# Patient Record
Sex: Female | Born: 1980 | Race: White | Hispanic: No | Marital: Married | Smoking: Never smoker
Health system: Southern US, Community
[De-identification: ages and names within clinical notes are randomized; demographics above are authoritative.]

## PROBLEM LIST (undated history)

## (undated) DIAGNOSIS — Z1379 Encounter for other screening for genetic and chromosomal anomalies: Secondary | ICD-10-CM

## (undated) DIAGNOSIS — Z8049 Family history of malignant neoplasm of other genital organs: Secondary | ICD-10-CM

## (undated) DIAGNOSIS — T7840XA Allergy, unspecified, initial encounter: Secondary | ICD-10-CM

## (undated) DIAGNOSIS — E039 Hypothyroidism, unspecified: Secondary | ICD-10-CM

## (undated) DIAGNOSIS — Z808 Family history of malignant neoplasm of other organs or systems: Secondary | ICD-10-CM

## (undated) DIAGNOSIS — G8929 Other chronic pain: Secondary | ICD-10-CM

## (undated) DIAGNOSIS — Z923 Personal history of irradiation: Secondary | ICD-10-CM

## (undated) DIAGNOSIS — K589 Irritable bowel syndrome without diarrhea: Secondary | ICD-10-CM

## (undated) DIAGNOSIS — Z800 Family history of malignant neoplasm of digestive organs: Secondary | ICD-10-CM

## (undated) DIAGNOSIS — K8020 Calculus of gallbladder without cholecystitis without obstruction: Secondary | ICD-10-CM

## (undated) DIAGNOSIS — G43909 Migraine, unspecified, not intractable, without status migrainosus: Secondary | ICD-10-CM

## (undated) DIAGNOSIS — K219 Gastro-esophageal reflux disease without esophagitis: Secondary | ICD-10-CM

## (undated) DIAGNOSIS — R102 Pelvic and perineal pain unspecified side: Secondary | ICD-10-CM

## (undated) DIAGNOSIS — K7689 Other specified diseases of liver: Secondary | ICD-10-CM

## (undated) DIAGNOSIS — R569 Unspecified convulsions: Secondary | ICD-10-CM

## (undated) DIAGNOSIS — R51 Headache: Secondary | ICD-10-CM

## (undated) DIAGNOSIS — Z9189 Other specified personal risk factors, not elsewhere classified: Secondary | ICD-10-CM

## (undated) DIAGNOSIS — R519 Headache, unspecified: Secondary | ICD-10-CM

## (undated) DIAGNOSIS — C73 Malignant neoplasm of thyroid gland: Secondary | ICD-10-CM

## (undated) DIAGNOSIS — F419 Anxiety disorder, unspecified: Secondary | ICD-10-CM

## (undated) DIAGNOSIS — Z803 Family history of malignant neoplasm of breast: Secondary | ICD-10-CM

## (undated) DIAGNOSIS — K602 Anal fissure, unspecified: Secondary | ICD-10-CM

## (undated) HISTORY — DX: Headache, unspecified: R51.9

## (undated) HISTORY — DX: Family history of malignant neoplasm of digestive organs: Z80.0

## (undated) HISTORY — DX: Gastro-esophageal reflux disease without esophagitis: K21.9

## (undated) HISTORY — DX: Pelvic and perineal pain unspecified side: R10.20

## (undated) HISTORY — DX: Migraine, unspecified, not intractable, without status migrainosus: G43.909

## (undated) HISTORY — DX: Irritable bowel syndrome, unspecified: K58.9

## (undated) HISTORY — DX: Family history of malignant neoplasm of other organs or systems: Z80.8

## (undated) HISTORY — DX: Other specified diseases of liver: K76.89

## (undated) HISTORY — DX: Allergy, unspecified, initial encounter: T78.40XA

## (undated) HISTORY — DX: Calculus of gallbladder without cholecystitis without obstruction: K80.20

## (undated) HISTORY — DX: Family history of malignant neoplasm of other genital organs: Z80.49

## (undated) HISTORY — DX: Encounter for other screening for genetic and chromosomal anomalies: Z13.79

## (undated) HISTORY — DX: Malignant neoplasm of thyroid gland: C73

## (undated) HISTORY — DX: Anal fissure, unspecified: K60.2

## (undated) HISTORY — DX: Other specified personal risk factors, not elsewhere classified: Z91.89

## (undated) HISTORY — DX: Family history of malignant neoplasm of breast: Z80.3

## (undated) HISTORY — DX: Headache: R51

## (undated) HISTORY — PX: COLONOSCOPY: SHX174

## (undated) HISTORY — DX: Other chronic pain: G89.29

## (undated) HISTORY — DX: Hypothyroidism, unspecified: E03.9

## (undated) HISTORY — DX: Pelvic and perineal pain: R10.2

---
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
## 2004-11-02 ENCOUNTER — Ambulatory Visit: Payer: Self-pay | Admitting: Unknown Physician Specialty

## 2005-06-20 ENCOUNTER — Emergency Department (HOSPITAL_COMMUNITY): Admission: EM | Admit: 2005-06-20 | Discharge: 2005-06-20 | Payer: Self-pay | Admitting: Emergency Medicine

## 2005-06-20 IMAGING — CR DG ELBOW COMPLETE 3+V*R*
4 series · 4 of 4 positions shown · non-contrast
Comparison: none

CLINICAL DATA: Fall off of a porch.  Medial posterior elbow pain.
 RIGHT ELBOW ? 4 VIEW:
 The patient has calcification associated with both the medial and the lateral soft tissues of the elbow that could relate to the common flexor and extensor tendons.  Some of this could be chronic calcification but the possibility of an avulsion of either of these tendons does exist.  No definite acute fracture.

[view not recorded (1 of 4)]
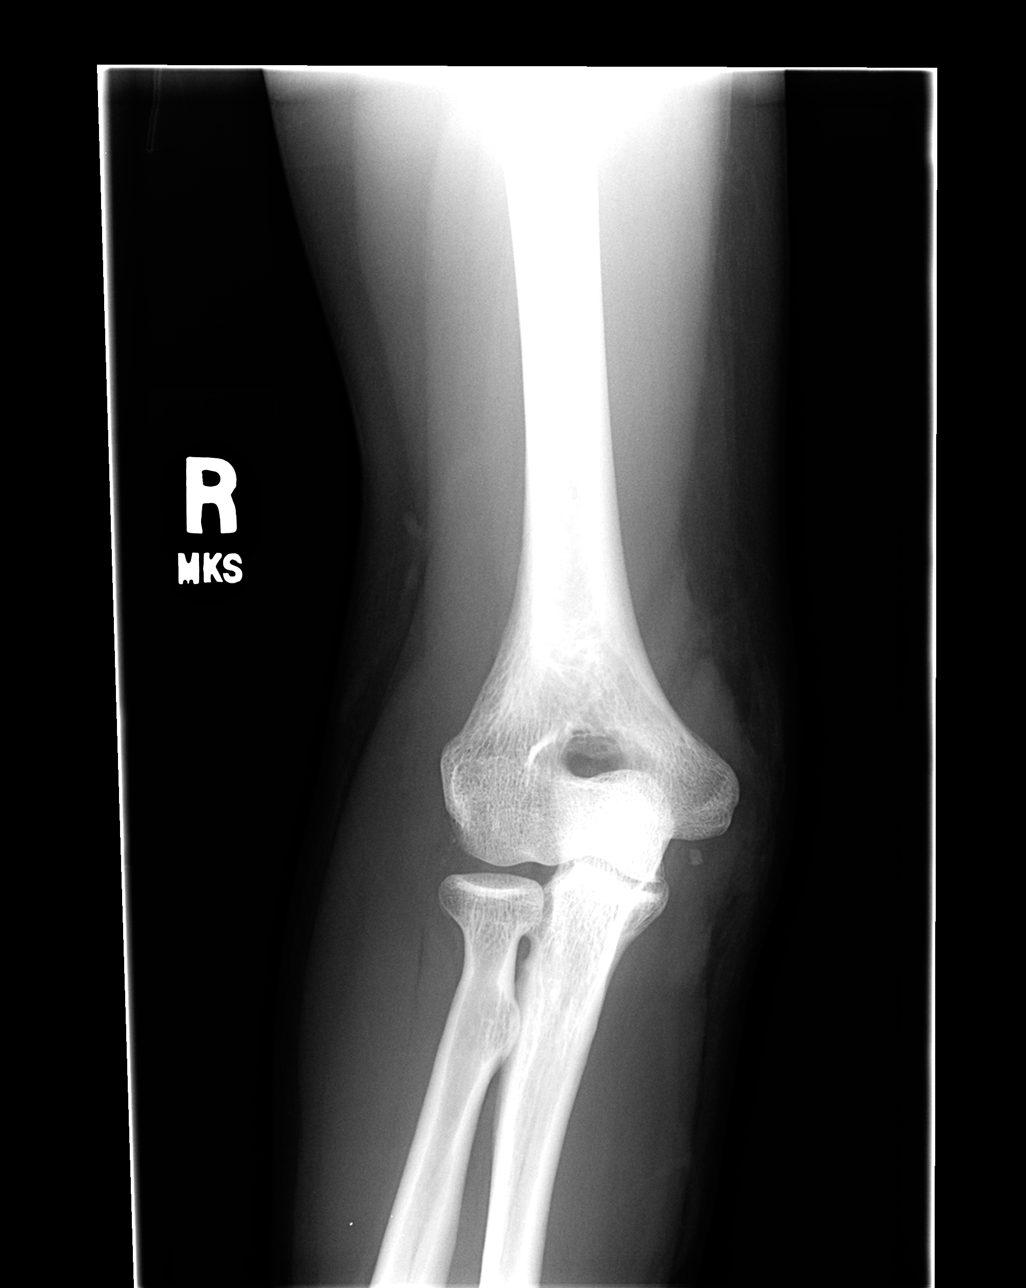

[view not recorded (2 of 4)]
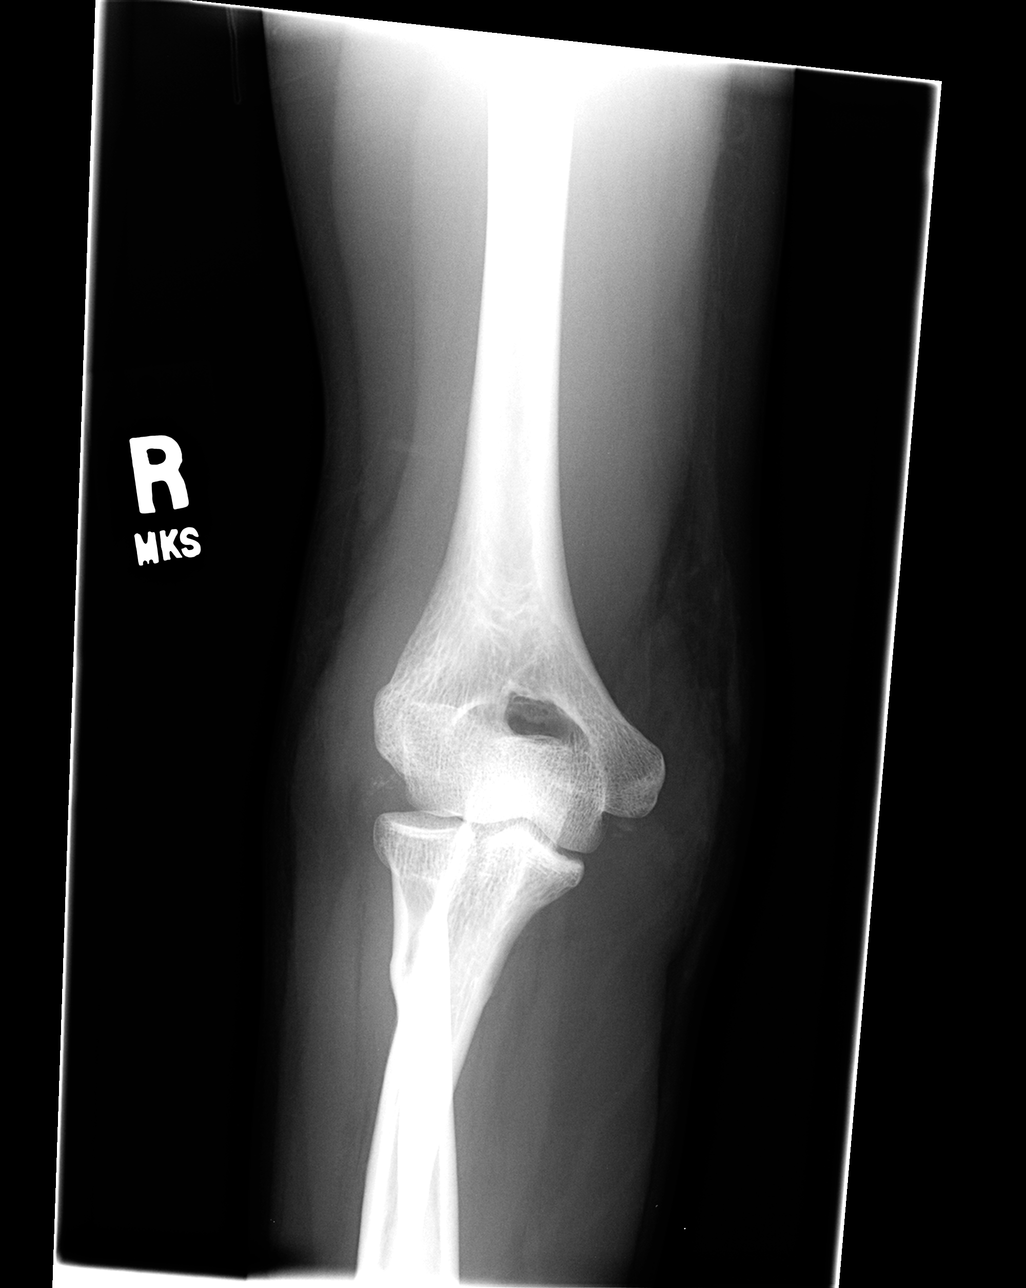

[view not recorded (3 of 4)]
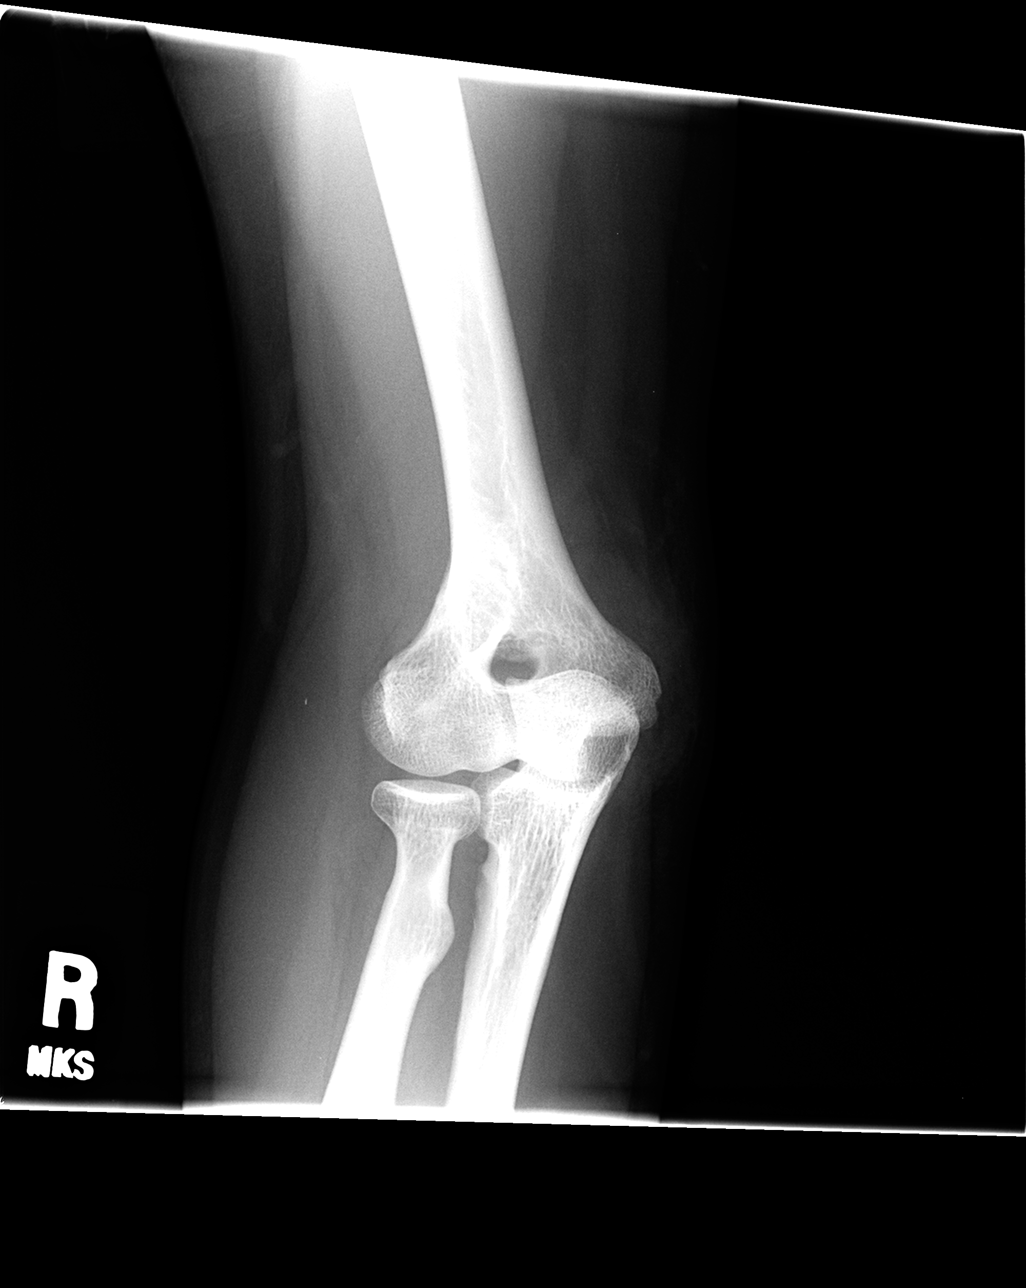

[view not recorded (4 of 4)]
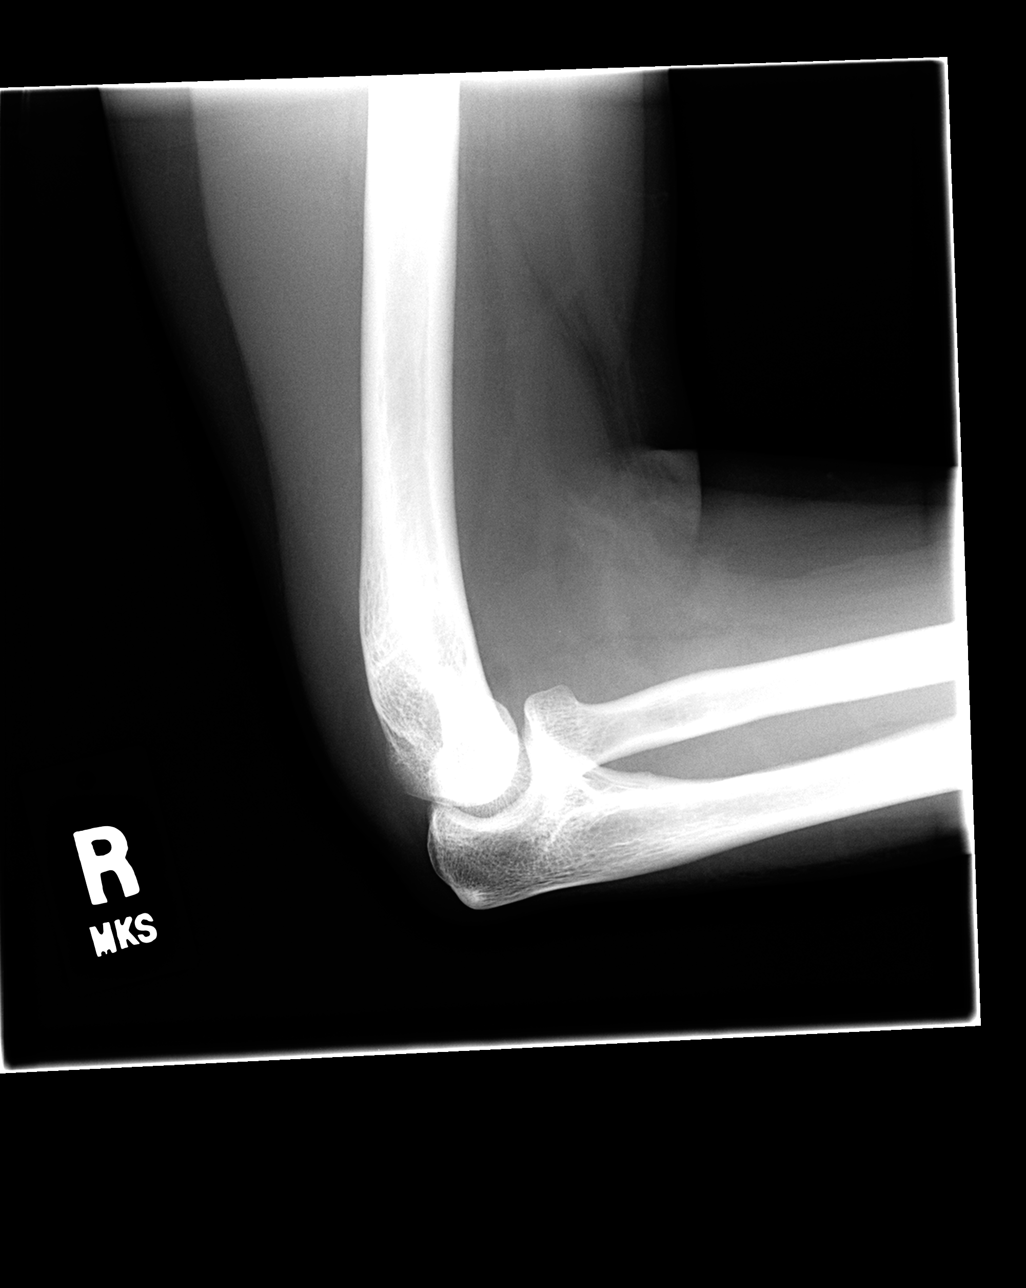

[4 of 4 positions shown; findings below may reference images not displayed]

IMPRESSION: As above.

## 2005-06-23 ENCOUNTER — Ambulatory Visit: Payer: Self-pay | Admitting: Unknown Physician Specialty

## 2005-06-23 IMAGING — CT CT NECK WITH CONTRAST
2 series · 10 of 14 positions shown, 12 images · non-contrast
Comparison: none

REASON FOR EXAM: Thyroid mass
COMMENTS:

[Series 2: soft tissue · axial · 0.40mm/px · z∈[+371,+563]mm · 6 of 90 slices shown, 8 images]
[im 13/90  soft-tissue]
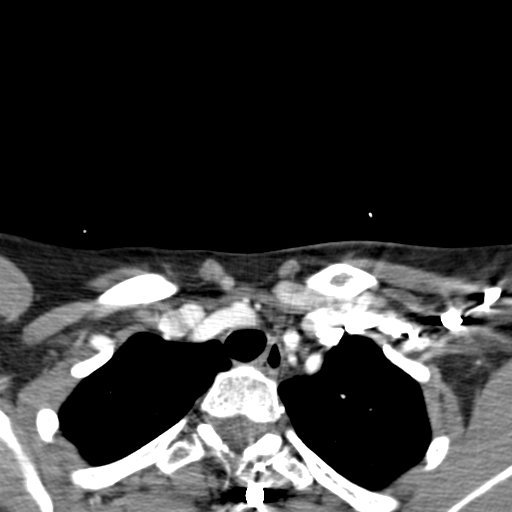
[im 13/90  bone]
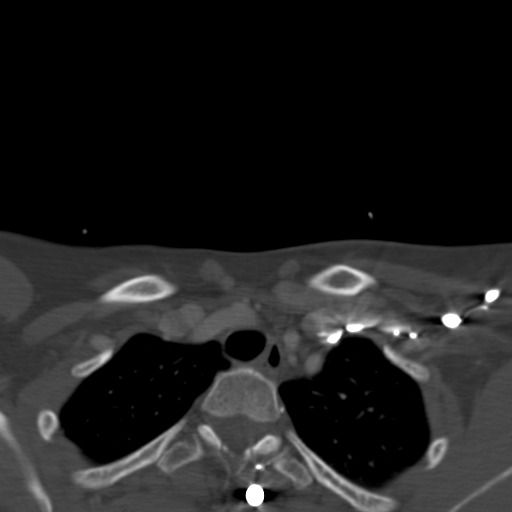
[im 26/90  bone]
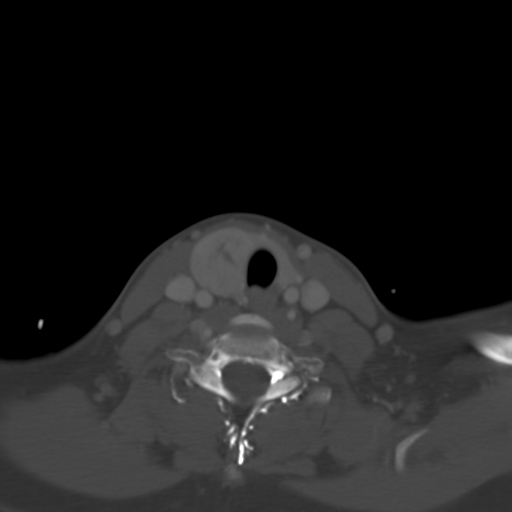
[im 39/90  bone]
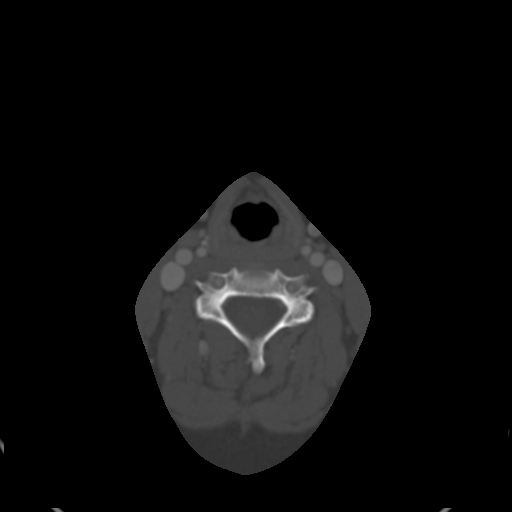
[im 51/90  bone]
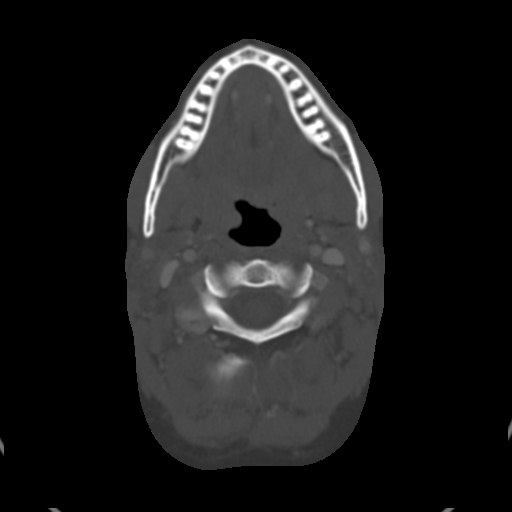
[im 64/90  soft-tissue]
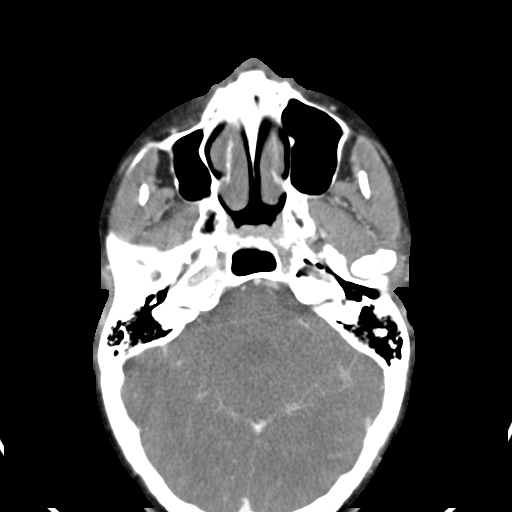
[im 64/90  bone]
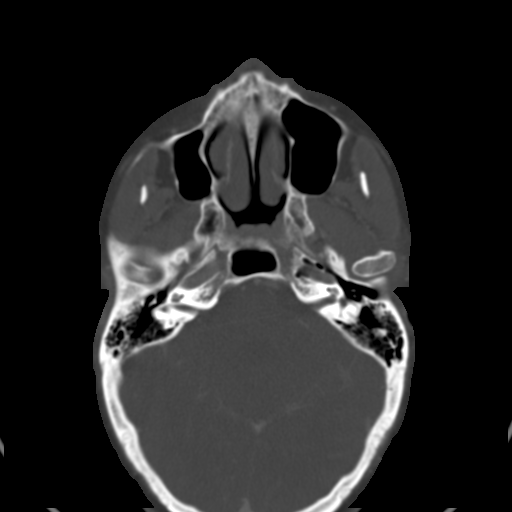
[im 77/90  bone]
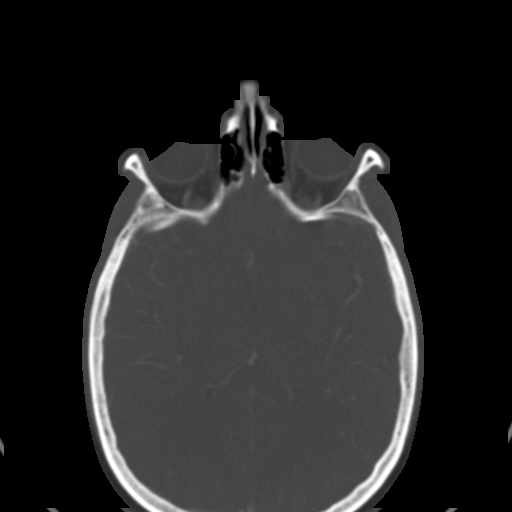

[Series 4: lung windows · axial · 0.39mm/px · z∈[+433,+559]mm · 4 of 70 slices shown]
[im 14/70  bone]
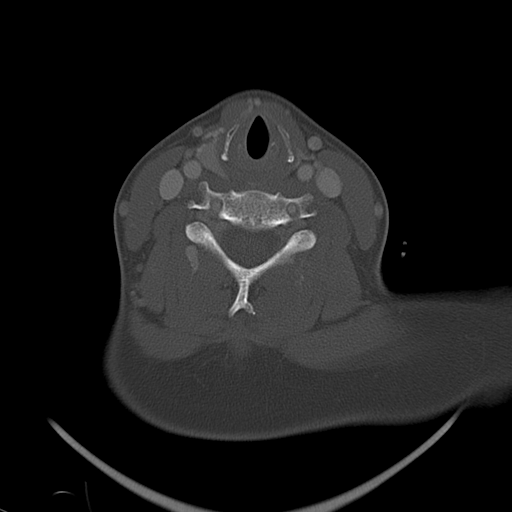
[im 28/70  bone]
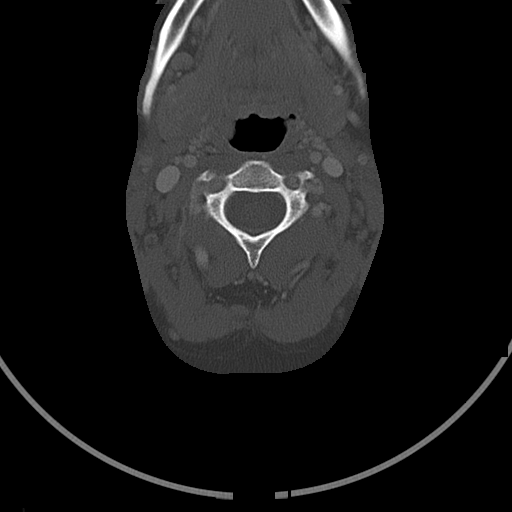
[im 42/70  bone]
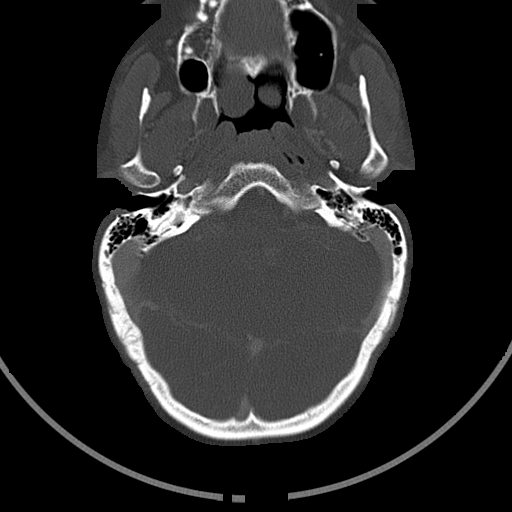
[im 56/70  bone]
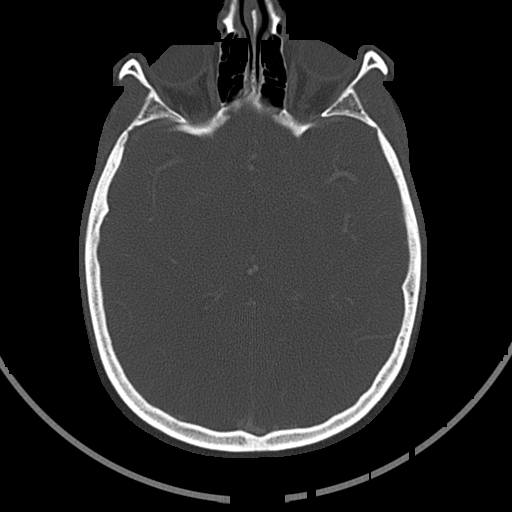

[10 of 14 positions shown; findings below may reference images not displayed]

PROCEDURE:     CT  - CT NECK WITH CONTRAST  - [DATE]  [DATE]

RESULT:     Enhanced CT scan was performed from the base of the skull to the
thoracic inlet.  There is noted prominence of the RIGHT lobe of the thyroid
with an enhancing nodule within the lobe.  There are some areas which do not
enhance as readily.  This may represent a functioning nodule and can be
correlated with the thyroid nuclear medicine scan.

Some small lymph nodes are noted within the neck, however, no enlarged lymph
nodes are seen.
IMPRESSION: Enhancing nodule in the RIGHT lobe of the thyroid.

## 2005-08-05 ENCOUNTER — Ambulatory Visit: Payer: Self-pay | Admitting: Unknown Physician Specialty

## 2005-09-19 DIAGNOSIS — C73 Malignant neoplasm of thyroid gland: Secondary | ICD-10-CM

## 2005-09-19 HISTORY — PX: THYROIDECTOMY: SHX17

## 2005-09-19 HISTORY — DX: Malignant neoplasm of thyroid gland: C73

## 2005-09-26 ENCOUNTER — Ambulatory Visit: Payer: Self-pay | Admitting: Unknown Physician Specialty

## 2005-10-07 ENCOUNTER — Ambulatory Visit: Payer: Self-pay | Admitting: Oncology

## 2005-10-20 ENCOUNTER — Ambulatory Visit: Payer: Self-pay | Admitting: Oncology

## 2005-11-08 IMAGING — NM NUCLEAR MEDICINE 24 HOUR THYROID UPTAKE
9 series · 32 of 32 positions shown · non-contrast
Comparison: none

REASON FOR EXAM: thyroid CA S/P thyroidectomy on [DATE]
COMMENTS:

[Series 1: (id) statics · 2.40mm/px · 2 acquisitions, 4 frames shown (1 of 4)]
[im 1/2  full-range]
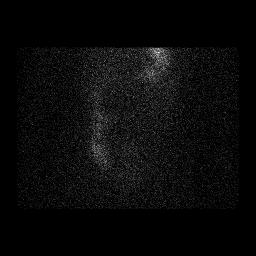
[im 1/2  full-range]
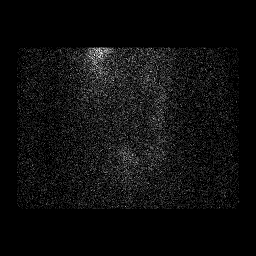
[im 2/2]
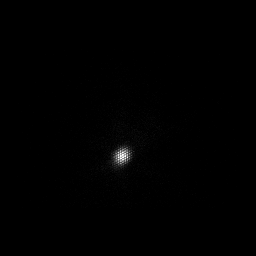
[im 2/2]
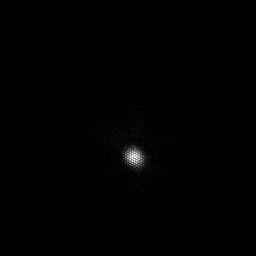

[Series 1: (id) statics · 2.40mm/px · 2 acquisitions, 4 frames shown (2 of 4)]
[im 1/2  full-range]
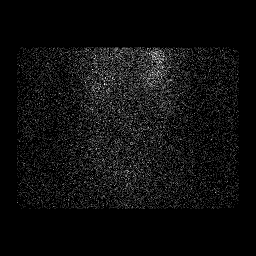
[im 1/2  full-range]
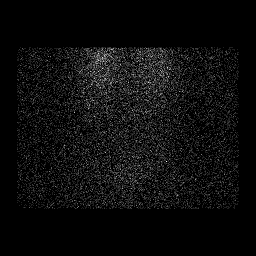
[im 2/2]
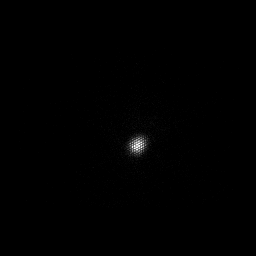
[im 2/2]
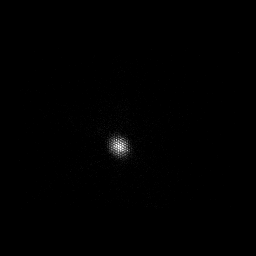

[Series 1: (id) wb 10 day post tx · 2.40mm/px · 2 of 2 frames shown]
[frame 1/2]
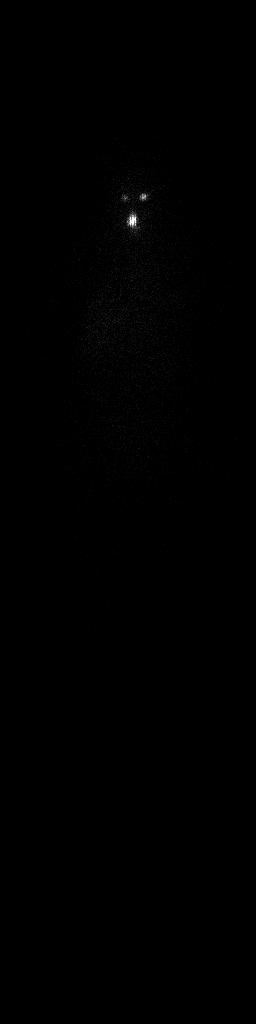
[frame 2/2]
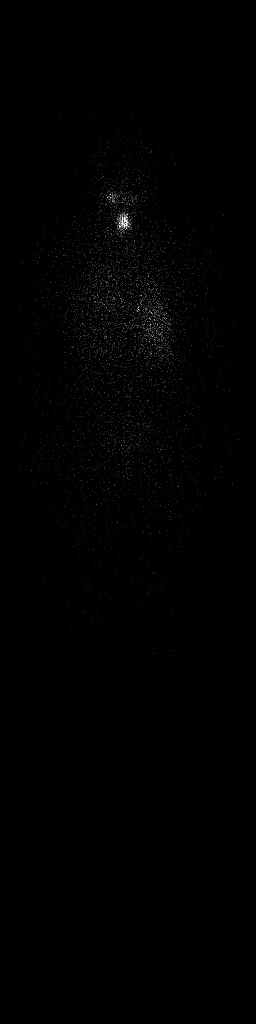

[Series 1: (id) wb 48 hr delay · delayed · 2.40mm/px · 2 of 2 frames shown]
[frame 1/2]
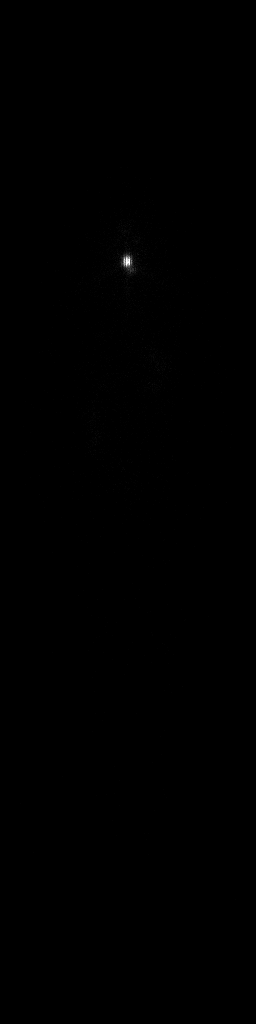
[frame 2/2]
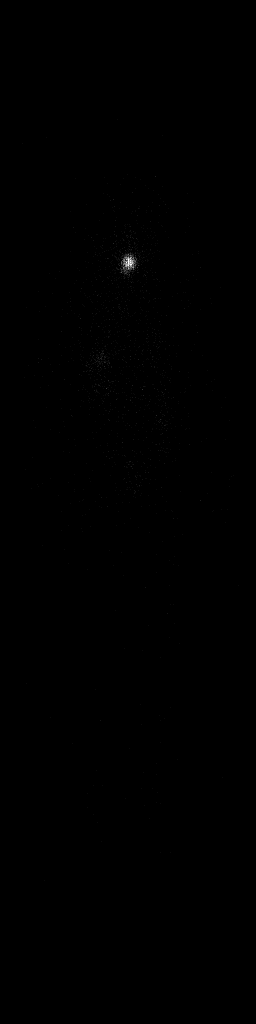

[Series 1: (id) statics (reformatted series) · 2.40mm/px · 3 acquisitions, 6 frames shown]
[im 1/3  full-range]
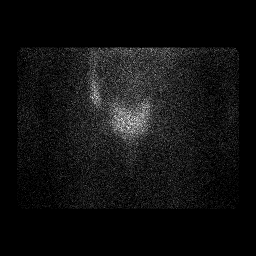
[im 1/3  full-range]
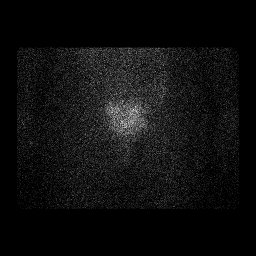
[im 2/3  full-range]
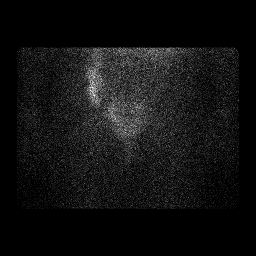
[im 2/3  full-range]
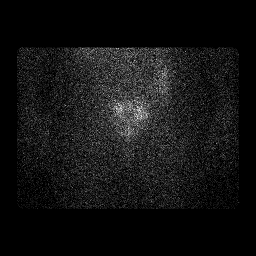
[im 3/3  full-range]
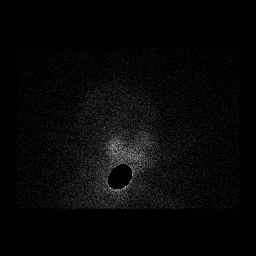
[im 3/3  full-range]
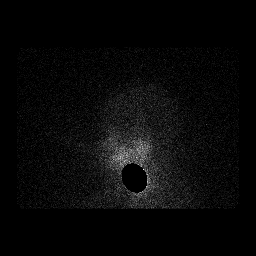

[Series 1: (id) wb 24 hr delay · delayed · 2.40mm/px · 2 of 2 frames shown]
[frame 1/2]
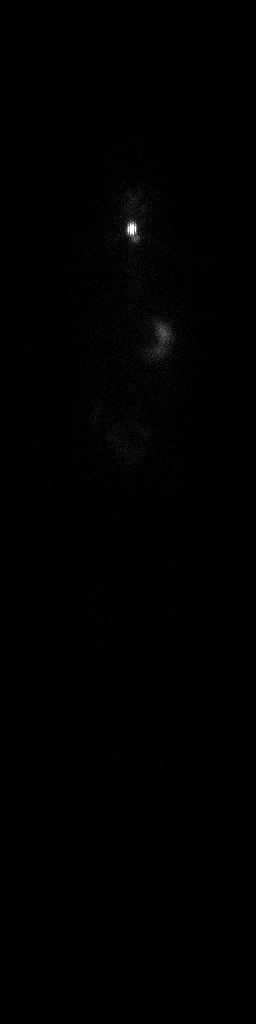
[frame 2/2]
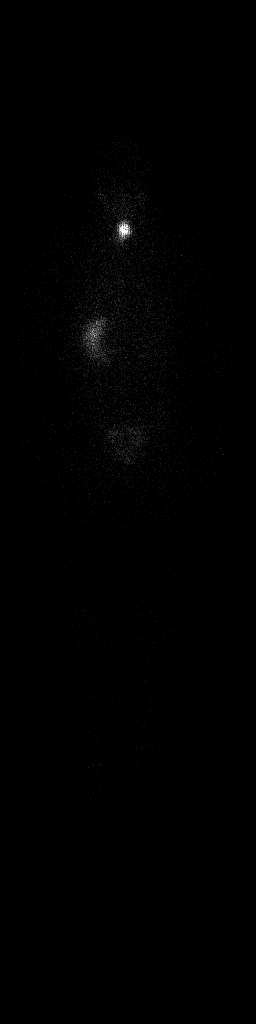

[Series 1: (id) statics · 2.40mm/px · 2 acquisitions, 4 frames shown (3 of 4)]
[im 1/2]
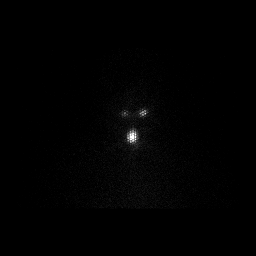
[im 1/2]
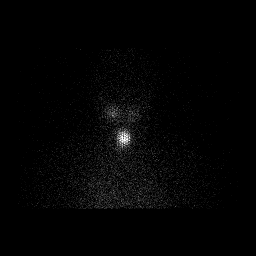
[im 2/2]
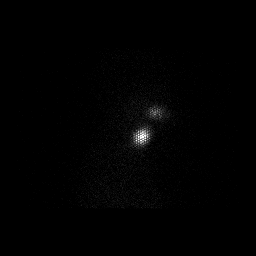
[im 2/2]
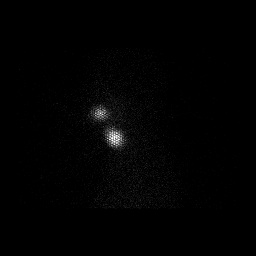

[Series 1: (id) statics · 2.40mm/px · 3 acquisitions, 6 frames shown (4 of 4)]
[im 1/3  full-range]
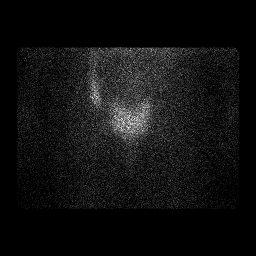
[im 1/3  full-range]
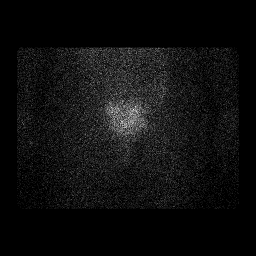
[im 2/3  full-range]
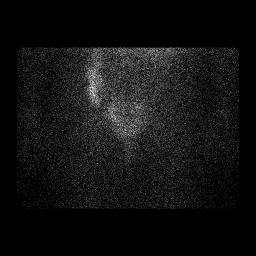
[im 2/3  full-range]
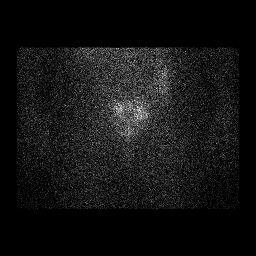
[im 3/3]
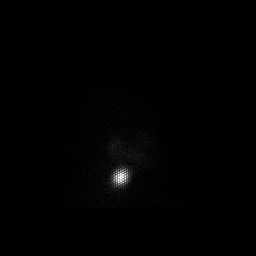
[im 3/3]
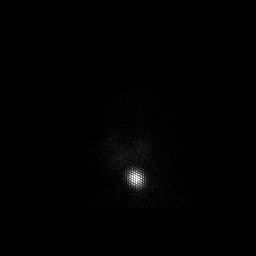

[Series 1: (id) wb 72 hr delay · delayed · 2.40mm/px · 2 of 2 frames shown]
[frame 1/2]
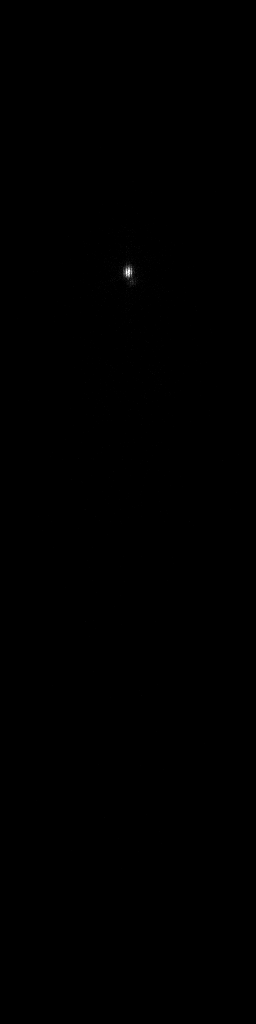
[frame 2/2]
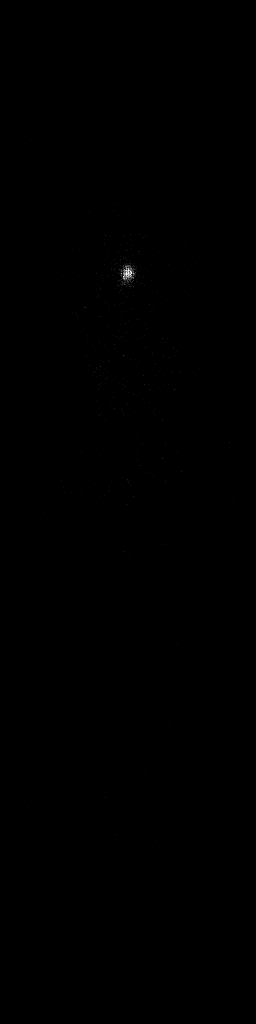

[32 of 32 positions shown; findings below may reference images not displayed]

PROCEDURE:     NM  - NM THYROID WB 72 HR [DATE] [DATE]

RESULT:     The patient was given a dose of 1.66 mCi of iodine 131. Anterior
and posterior whole body images were obtained at 24, 48 and 72 hours.  The
patient had a negative urine pregnancy exam.  The patient has a history of
papillary carcinoma in the RIGHT lobe of the thyroid and history of LEFT
hemithyroidectomy performed.  The LEFT lobe shows two foci of 2 mm size
showing a follicular variant papillary thyroid carcinoma.  The surgical
margin was focally involved according to the pathologists' report.  No
report of the RIGHT lobe is submitted.

The study shows evidence of localizing activity in the area of the thyroid
bed.  No distant sites of abnormal localization are identified.  There is
normal GI and GU localization as well as normal blood pool activity seen.
IMPRESSION: 1)Some residual activity over the thyroid bed region. The patient is
scheduled for iodine 131 therapy.

Thank you for this opportunity to contribute to the care of your patient.

ADDENDUM:  [DATE]

The patient returned 10 days post therapy for whole body scan.  The study
shows accumulation in the salivary glands in a symmetric fashion as well as
in the thyroid bed. There is diffusely increased localization over the RIGHT
thorax, which may indicate some underlying microscopic localization.  This
could suggest microscopic metastatic disease to the RIGHT lung.  Close
follow-up with thyroglobulins over the next 6-12 months would be
recommended. Whole body scan could be repeated at one year especially if the
thyroglobulin remains elevated. No other abnormal areas of localization are
seen.

## 2005-11-17 ENCOUNTER — Ambulatory Visit: Payer: Self-pay | Admitting: Oncology

## 2005-12-18 ENCOUNTER — Ambulatory Visit: Payer: Self-pay | Admitting: Oncology

## 2005-12-26 ENCOUNTER — Ambulatory Visit: Payer: Self-pay | Admitting: Gynecology

## 2006-01-17 ENCOUNTER — Ambulatory Visit: Payer: Self-pay | Admitting: Oncology

## 2006-03-09 ENCOUNTER — Ambulatory Visit: Payer: Self-pay | Admitting: Oncology

## 2006-03-19 ENCOUNTER — Ambulatory Visit: Payer: Self-pay | Admitting: Oncology

## 2006-09-19 HISTORY — PX: TOTAL ABDOMINAL HYSTERECTOMY: SHX209

## 2006-09-22 ENCOUNTER — Ambulatory Visit: Payer: Self-pay | Admitting: Internal Medicine

## 2006-10-20 ENCOUNTER — Ambulatory Visit: Payer: Self-pay | Admitting: Internal Medicine

## 2006-11-09 ENCOUNTER — Ambulatory Visit: Payer: Self-pay | Admitting: Unknown Physician Specialty

## 2007-03-20 ENCOUNTER — Ambulatory Visit: Payer: Self-pay | Admitting: Oncology

## 2007-04-20 ENCOUNTER — Ambulatory Visit: Payer: Self-pay | Admitting: Oncology

## 2007-05-14 ENCOUNTER — Ambulatory Visit: Payer: Self-pay | Admitting: Oncology

## 2007-05-21 ENCOUNTER — Ambulatory Visit: Payer: Self-pay | Admitting: Oncology

## 2007-08-30 ENCOUNTER — Ambulatory Visit: Payer: Self-pay | Admitting: Urology

## 2007-08-30 IMAGING — CR DG IVP HYPERTENSIVE
1 series · 8 of 10 positions shown · non-contrast
Comparison: none

REASON FOR EXAM: hematuria     pel pain
COMMENTS:

[Series 1: view not recorded · 0.17mm/px · 8 of 13 slices shown]
[im 1/13]
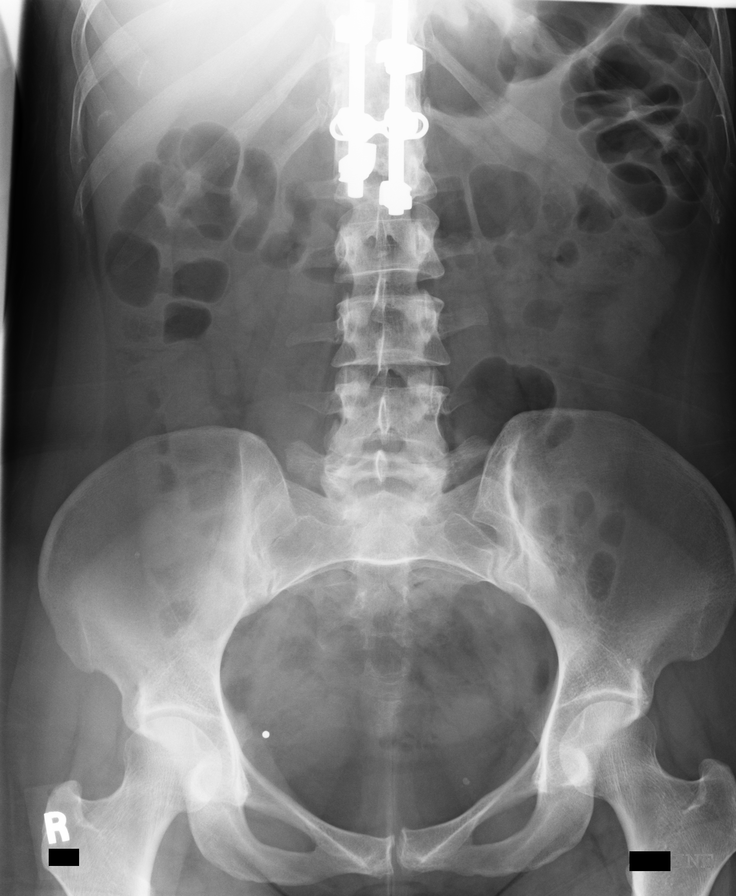
[im 2/13]
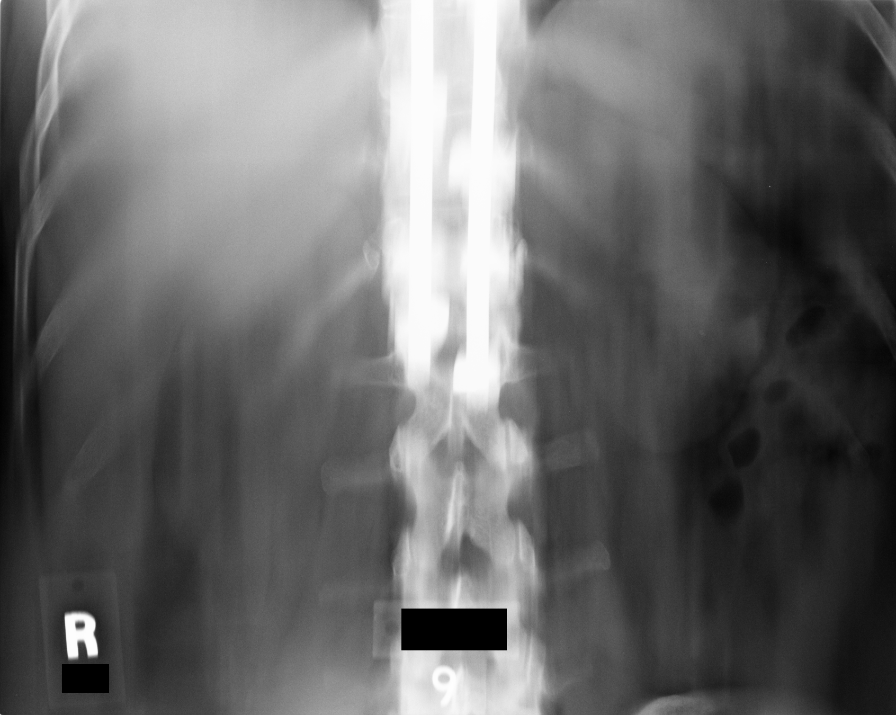
[im 3/13]
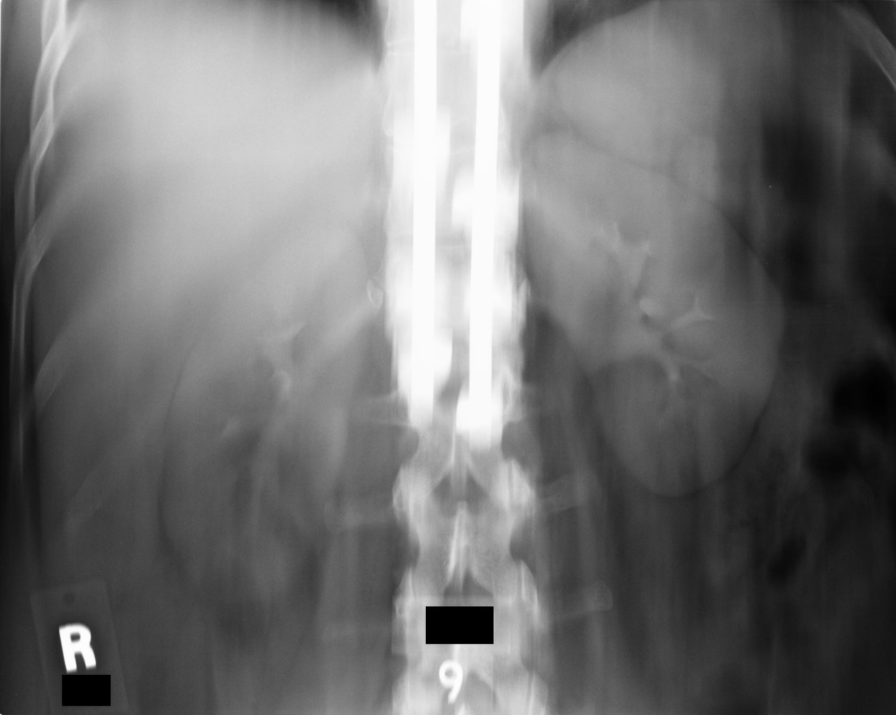
[im 5/13]
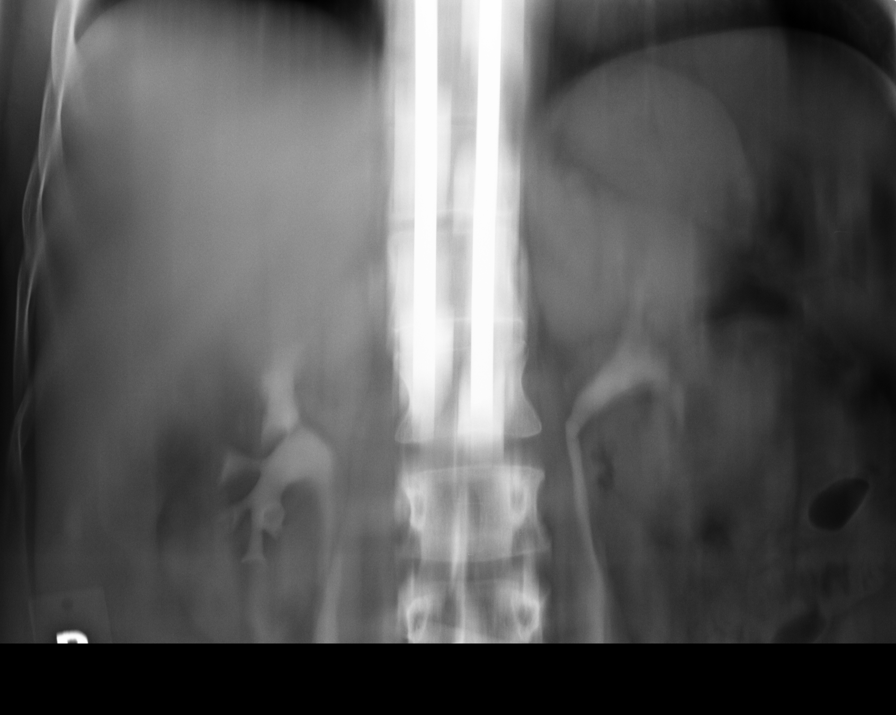
[im 6/13]
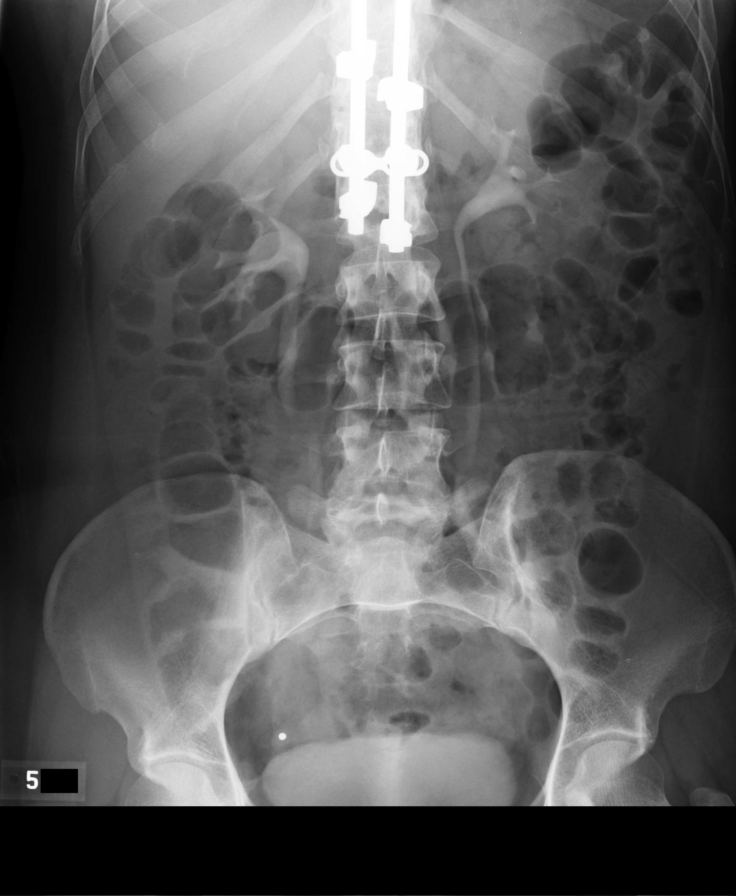
[im 7/13]
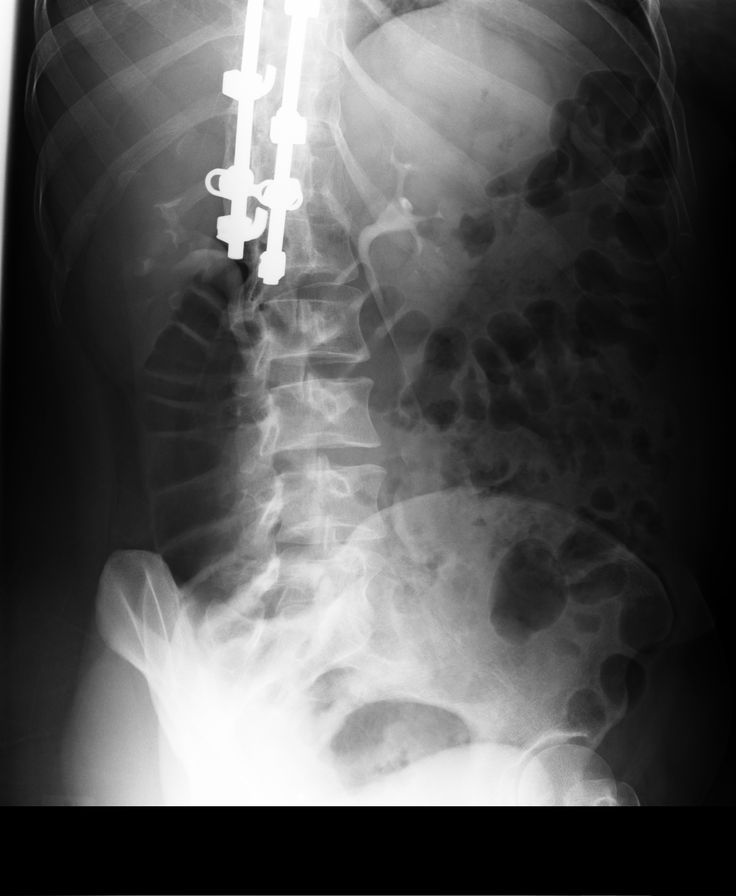
[im 9/13]
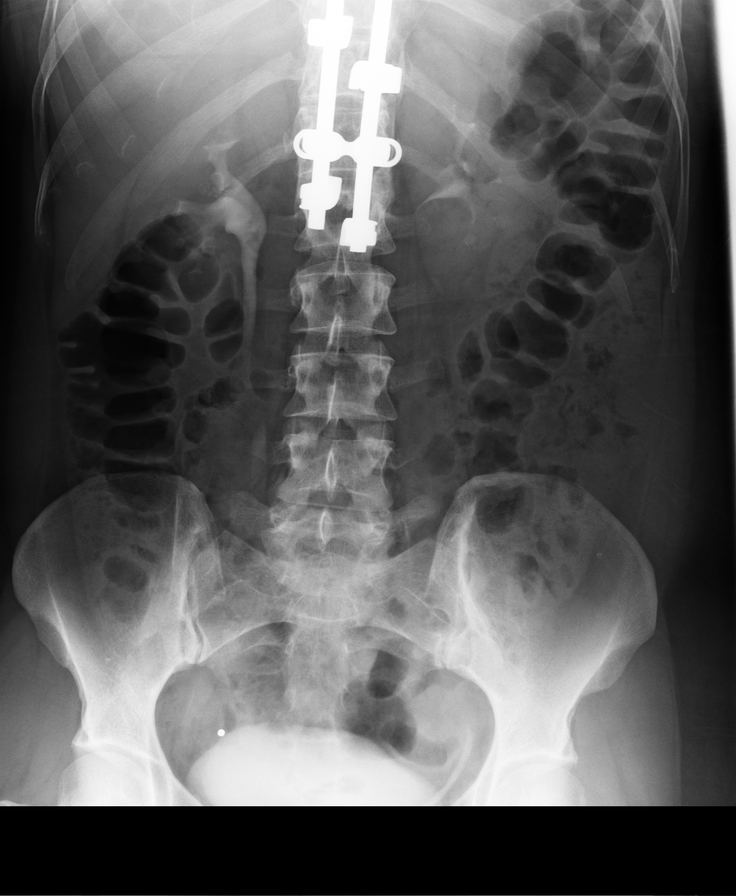
[im 10/13]
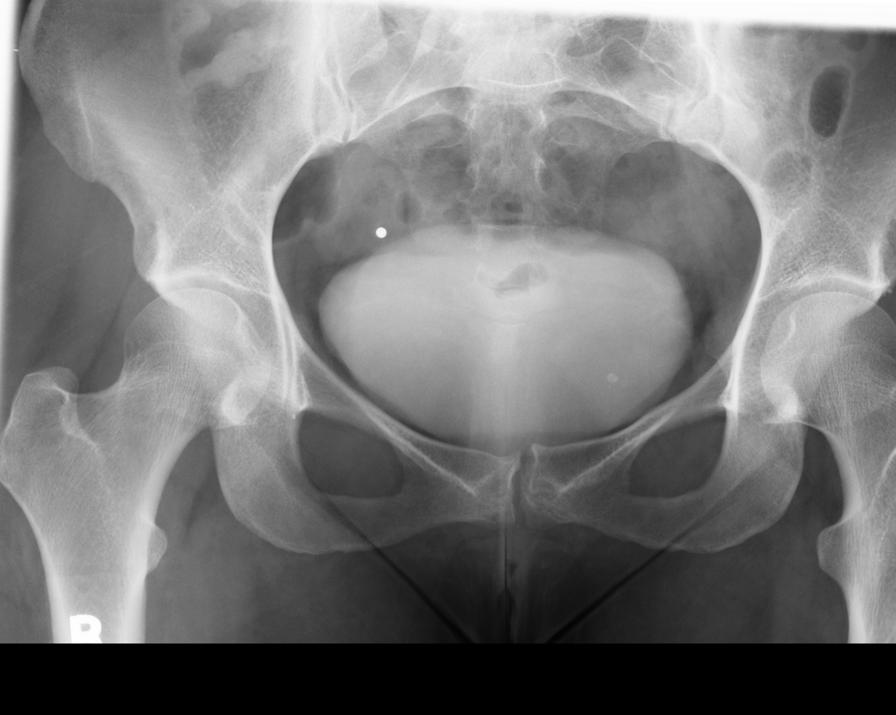

[8 of 10 positions shown; findings below may reference images not displayed]

PROCEDURE:     DXR - DXR INTRAVENOUS UROGRAPHY (IVP)  - [DATE]  [DATE]

RESULT:     An intravenous urogram was performed status post intravenous
administration of 50 ml of [8B].  Multiple images of the RIGHT and
LEFT collecting systems were obtained.

Scout imaging demonstrates air within nondilated loops of large and small
bowel.  Spinal fixation rods and screws are identified within the
thoracolumbar spine region.  A small rounded metallic density projects in
the region of the true pelvis on the RIGHT. This may represent a
postsurgical finding.

Symmetric bilateral nephrograms are identified.  There is symmetric
excretion of contrast into the RIGHT and LEFT pelvocaliceal systems.  There
is no evidence of hydronephrosis. The fornices appear wispy without evidence
of forniceal blunting. The renal pelves and ureters demonstrate no evidence
of filling defects, strictural narrowing or focal outpouchings. The urinary
bladder is distended with urine.  A small post-void residual is identified.
There is no evidence of cortical thinning, filling defects or evidence of
mass effect or masses.
IMPRESSION: Unremarkable intravenous urogram as described above.

## 2007-12-20 ENCOUNTER — Ambulatory Visit: Payer: Self-pay | Admitting: Oncology

## 2008-01-18 ENCOUNTER — Ambulatory Visit: Payer: Self-pay | Admitting: Oncology

## 2008-02-18 ENCOUNTER — Ambulatory Visit: Payer: Self-pay | Admitting: Oncology

## 2008-07-20 ENCOUNTER — Ambulatory Visit: Payer: Self-pay | Admitting: Oncology

## 2008-07-28 ENCOUNTER — Ambulatory Visit: Payer: Self-pay | Admitting: Oncology

## 2008-08-19 ENCOUNTER — Ambulatory Visit: Payer: Self-pay | Admitting: Oncology

## 2009-01-17 ENCOUNTER — Ambulatory Visit: Payer: Self-pay | Admitting: Oncology

## 2009-01-28 ENCOUNTER — Ambulatory Visit: Payer: Self-pay | Admitting: Oncology

## 2009-02-17 ENCOUNTER — Ambulatory Visit: Payer: Self-pay | Admitting: Oncology

## 2009-07-20 ENCOUNTER — Ambulatory Visit: Payer: Self-pay | Admitting: Oncology

## 2009-07-29 ENCOUNTER — Ambulatory Visit: Payer: Self-pay | Admitting: Oncology

## 2009-08-19 ENCOUNTER — Ambulatory Visit: Payer: Self-pay | Admitting: Oncology

## 2009-09-30 ENCOUNTER — Ambulatory Visit: Payer: Self-pay | Admitting: Gastroenterology

## 2010-01-17 ENCOUNTER — Ambulatory Visit: Payer: Self-pay | Admitting: Oncology

## 2010-01-27 ENCOUNTER — Ambulatory Visit: Payer: Self-pay | Admitting: Oncology

## 2010-02-17 ENCOUNTER — Ambulatory Visit: Payer: Self-pay | Admitting: Oncology

## 2011-02-17 ENCOUNTER — Ambulatory Visit: Payer: Self-pay | Admitting: Oncology

## 2011-02-18 ENCOUNTER — Ambulatory Visit: Payer: Self-pay | Admitting: Oncology

## 2012-03-08 ENCOUNTER — Ambulatory Visit: Payer: Self-pay | Admitting: Oncology

## 2012-03-08 LAB — CBC CANCER CENTER
Basophil #: 0 x10 3/mm (ref 0.0–0.1)
Basophil %: 0.5 %
Eosinophil #: 0.1 x10 3/mm (ref 0.0–0.7)
Eosinophil %: 0.9 %
HCT: 38.5 % (ref 35.0–47.0)
HGB: 13 g/dL (ref 12.0–16.0)
Lymphocyte #: 1.7 x10 3/mm (ref 1.0–3.6)
Lymphocyte %: 22.8 %
MCH: 30.4 pg (ref 26.0–34.0)
MCHC: 33.8 g/dL (ref 32.0–36.0)
MCV: 90 fL (ref 80–100)
Monocyte #: 0.4 x10 3/mm (ref 0.2–0.9)
Monocyte %: 5.4 %
Neutrophil #: 5.1 x10 3/mm (ref 1.4–6.5)
Neutrophil %: 70.4 %
Platelet: 292 x10 3/mm (ref 150–440)
RBC: 4.28 10*6/uL (ref 3.80–5.20)
RDW: 12.7 % (ref 11.5–14.5)
WBC: 7.3 x10 3/mm (ref 3.6–11.0)

## 2012-03-08 LAB — COMPREHENSIVE METABOLIC PANEL
Albumin: 3.5 g/dL (ref 3.4–5.0)
Alkaline Phosphatase: 47 U/L — ABNORMAL LOW (ref 50–136)
Anion Gap: 8 (ref 7–16)
BUN: 8 mg/dL (ref 7–18)
Bilirubin,Total: 0.3 mg/dL (ref 0.2–1.0)
Calcium, Total: 8.9 mg/dL (ref 8.5–10.1)
Chloride: 104 mmol/L (ref 98–107)
Co2: 30 mmol/L (ref 21–32)
Creatinine: 0.8 mg/dL (ref 0.60–1.30)
EGFR (African American): >60
EGFR (Non-African Amer.): >60
Glucose: 85 mg/dL (ref 65–99)
Osmolality: 281 (ref 275–301)
Potassium: 3.9 mmol/L (ref 3.5–5.1)
SGOT(AST): 21 U/L (ref 15–37)
SGPT (ALT): 19 U/L
Sodium: 142 mmol/L (ref 136–145)
Total Protein: 7.2 g/dL (ref 6.4–8.2)

## 2012-03-08 LAB — TSH: Thyroid Stimulating Horm: 14.9 u[IU]/mL — ABNORMAL HIGH

## 2012-03-19 ENCOUNTER — Ambulatory Visit: Payer: Self-pay | Admitting: Oncology

## 2012-06-07 ENCOUNTER — Ambulatory Visit: Payer: Self-pay | Admitting: Oncology

## 2012-06-07 LAB — TSH: Thyroid Stimulating Horm: 11 u[IU]/mL — ABNORMAL HIGH

## 2012-06-19 ENCOUNTER — Ambulatory Visit: Payer: Self-pay | Admitting: Oncology

## 2012-09-06 ENCOUNTER — Ambulatory Visit: Payer: Self-pay | Admitting: Oncology

## 2012-09-19 ENCOUNTER — Ambulatory Visit: Payer: Self-pay | Admitting: Oncology

## 2012-11-17 ENCOUNTER — Ambulatory Visit: Payer: Self-pay | Admitting: Oncology

## 2012-12-06 LAB — CBC CANCER CENTER
Basophil #: 0 x10 3/mm (ref 0.0–0.1)
Basophil %: 0.7 %
Eosinophil #: 0.1 x10 3/mm (ref 0.0–0.7)
Eosinophil %: 1 %
HCT: 39 % (ref 35.0–47.0)
HGB: 13.3 g/dL (ref 12.0–16.0)
Lymphocyte #: 1.4 x10 3/mm (ref 1.0–3.6)
Lymphocyte %: 23.6 %
MCH: 29.6 pg (ref 26.0–34.0)
MCHC: 34 g/dL (ref 32.0–36.0)
MCV: 87 fL (ref 80–100)
Monocyte #: 0.3 x10 3/mm (ref 0.2–0.9)
Monocyte %: 5 %
Neutrophil #: 4 x10 3/mm (ref 1.4–6.5)
Neutrophil %: 69.7 %
Platelet: 294 x10 3/mm (ref 150–440)
RBC: 4.48 10*6/uL (ref 3.80–5.20)
RDW: 12.7 % (ref 11.5–14.5)
WBC: 5.8 x10 3/mm (ref 3.6–11.0)

## 2012-12-06 LAB — COMPREHENSIVE METABOLIC PANEL
Albumin: 3.5 g/dL (ref 3.4–5.0)
Alkaline Phosphatase: 56 U/L (ref 50–136)
Anion Gap: 5 — ABNORMAL LOW (ref 7–16)
BUN: 8 mg/dL (ref 7–18)
Bilirubin,Total: 0.2 mg/dL (ref 0.2–1.0)
Calcium, Total: 8.7 mg/dL (ref 8.5–10.1)
Chloride: 106 mmol/L (ref 98–107)
Co2: 29 mmol/L (ref 21–32)
Creatinine: 0.79 mg/dL (ref 0.60–1.30)
EGFR (African American): >60
EGFR (Non-African Amer.): >60
Glucose: 90 mg/dL (ref 65–99)
Osmolality: 277 (ref 275–301)
Potassium: 3.9 mmol/L (ref 3.5–5.1)
SGOT(AST): 18 U/L (ref 15–37)
SGPT (ALT): 17 U/L (ref 12–78)
Sodium: 140 mmol/L (ref 136–145)
Total Protein: 7.2 g/dL (ref 6.4–8.2)

## 2012-12-06 LAB — TSH: Thyroid Stimulating Horm: 12.3 u[IU]/mL — ABNORMAL HIGH

## 2012-12-18 ENCOUNTER — Ambulatory Visit: Payer: Self-pay | Admitting: Oncology

## 2013-06-10 ENCOUNTER — Ambulatory Visit: Payer: Self-pay | Admitting: Oncology

## 2013-06-10 LAB — TSH: Thyroid Stimulating Horm: 3.92 u[IU]/mL

## 2013-06-19 ENCOUNTER — Ambulatory Visit: Payer: Self-pay | Admitting: Oncology

## 2013-07-23 ENCOUNTER — Ambulatory Visit: Payer: Self-pay | Admitting: Adult Health

## 2013-08-27 LAB — HM PAP SMEAR: HM Pap smear: NORMAL

## 2013-09-04 ENCOUNTER — Ambulatory Visit: Payer: Self-pay | Admitting: Oncology

## 2013-09-04 LAB — TSH: Thyroid Stimulating Horm: 12.2 u[IU]/mL — ABNORMAL HIGH

## 2013-09-19 ENCOUNTER — Ambulatory Visit: Payer: Self-pay | Admitting: Oncology

## 2013-09-23 ENCOUNTER — Encounter (INDEPENDENT_AMBULATORY_CARE_PROVIDER_SITE_OTHER): Payer: Self-pay

## 2013-09-23 ENCOUNTER — Encounter: Payer: Self-pay | Admitting: Adult Health

## 2013-09-23 ENCOUNTER — Ambulatory Visit (INDEPENDENT_AMBULATORY_CARE_PROVIDER_SITE_OTHER): Payer: BC Managed Care – PPO | Admitting: Adult Health

## 2013-09-23 VITALS — BP 106/62 | HR 78 | Temp 98.3°F | Resp 12 | Ht 60.75 in | Wt 130.0 lb

## 2013-09-23 DIAGNOSIS — G43909 Migraine, unspecified, not intractable, without status migrainosus: Secondary | ICD-10-CM | POA: Insufficient documentation

## 2013-09-23 DIAGNOSIS — Z1239 Encounter for other screening for malignant neoplasm of breast: Secondary | ICD-10-CM

## 2013-09-23 MED ORDER — SUMATRIPTAN SUCCINATE 50 MG PO TABS: ORAL_TABLET | ORAL | Status: DC

## 2013-09-23 NOTE — Assessment & Plan Note (Signed)
Pt with strong family hx of breast cancer. She has 5 aunts, 3 of which have died from breast cancer w/mets. Send for baseline mammogram. If normal, will repeat at age 33 and begin yearly exams. Recommended monthly self breast exams.

## 2013-09-23 NOTE — Assessment & Plan Note (Signed)
Start imitrex 50 mg at the onset of migraine. May repeat in 2 hours if HA persists. No more than 2 doses in 24 hour period. F/U 1 month or sooner if necessary.

## 2013-09-23 NOTE — Patient Instructions (Addendum)
   Thank you for choosing [facility] for your health care needs.  I am ordering a baseline mammogram. Our office will contact you by the end of the week with an appointment.  I will request your medical records from your previous doctors.  Imitrex 50 mg for migraine headache. You may repeat 1 dose in 2 hours if migraine persists. Do not take more than 2 doses in a 24 hour period.   Return for follow up in 1 month

## 2013-09-23 NOTE — Progress Notes (Signed)
   Subjective:    Patient ID: Sarah Moran, female    DOB: [birth year], 33 y.o.   MRN: [id number]  HPI  Pt presents to clinic to establish care. She was referred by Dr. Oliva Bustard. She is followed by Dr. Oliva Bustard for hx of thyroid cancer s/p thyroidectomy (2007).  Also followed at [place]. Last PAP 08/2013 - Normal per pt report.   Review of Systems  Constitutional: Negative.   Eyes: Negative.   Respiratory: Negative.   Cardiovascular: Negative.   Gastrointestinal: Negative.   Endocrine: Negative.   Genitourinary: Negative.   Musculoskeletal: Negative.   Skin: Negative.   Allergic/Immunologic: Negative.   Neurological: Positive for headaches. Negative for dizziness, tremors, speech difficulty, weakness and light-headedness.       Hx of migraine HA and also sinus HA. She has taken imitrex for migraines in the past.  Hematological: Negative.   Psychiatric/Behavioral: Negative.        Objective:   Physical Exam  Constitutional: She is oriented to person, place, and time. She appears well-developed and well-nourished. No distress.  HENT:  Head: Normocephalic and atraumatic.  Right Ear: External ear normal.  Left Ear: External ear normal.  Nose: Nose normal.  Mouth/Throat: Oropharynx is clear and moist.  Right preauricular node palpable.  Eyes: Conjunctivae and EOM are normal. Pupils are equal, round, and reactive to light.  Neck: Normal range of motion. Neck supple. No tracheal deviation present.  Anterior neck scar from thyroidectomy   Cardiovascular: Normal rate, regular rhythm, normal heart sounds and intact distal pulses.  Exam reveals no gallop and no friction rub.   No murmur heard. Pulmonary/Chest: Effort normal and breath sounds normal. No respiratory distress. She has no wheezes. She has no rales.  Abdominal: Soft. Bowel sounds are normal.  Musculoskeletal: Normal range of motion. She exhibits no edema and no tenderness.  Lymphadenopathy:    She has no cervical  adenopathy.  Neurological: She is alert and oriented to person, place, and time. She has normal reflexes. No cranial nerve deficit. Coordination normal.  Skin: Skin is warm and dry.     Psychiatric: She has a normal mood and affect. Her behavior is normal. Judgment and thought content normal.          Assessment & Plan:

## 2013-09-23 NOTE — Progress Notes (Signed)
Pre visit review using our clinic review tool, if applicable. No additional management support is needed unless otherwise documented below in the visit note. 

## 2013-09-30 ENCOUNTER — Encounter: Payer: Self-pay | Admitting: Adult Health

## 2013-10-24 ENCOUNTER — Ambulatory Visit (INDEPENDENT_AMBULATORY_CARE_PROVIDER_SITE_OTHER): Payer: BC Managed Care – PPO | Admitting: Adult Health

## 2013-10-24 ENCOUNTER — Encounter (INDEPENDENT_AMBULATORY_CARE_PROVIDER_SITE_OTHER): Payer: Self-pay

## 2013-10-24 ENCOUNTER — Encounter: Payer: Self-pay | Admitting: Adult Health

## 2013-10-24 VITALS — BP 100/60 | HR 79 | Resp 12 | Wt 131.0 lb

## 2013-10-24 DIAGNOSIS — R350 Frequency of micturition: Secondary | ICD-10-CM

## 2013-10-24 DIAGNOSIS — G43909 Migraine, unspecified, not intractable, without status migrainosus: Secondary | ICD-10-CM

## 2013-10-24 DIAGNOSIS — R3129 Other microscopic hematuria: Secondary | ICD-10-CM

## 2013-10-24 LAB — URINALYSIS, ROUTINE W REFLEX MICROSCOPIC
Bilirubin Urine: NEGATIVE
Ketones, ur: NEGATIVE
Leukocytes, UA: NEGATIVE
Nitrite: NEGATIVE
Specific Gravity, Urine: >=1.03 — AB (ref 1.000–1.030)
Total Protein, Urine: NEGATIVE
Urine Glucose: NEGATIVE
Urobilinogen, UA: 1 (ref 0.0–1.0)
pH: 6 (ref 5.0–8.0)

## 2013-10-24 LAB — POCT URINALYSIS DIPSTICK
Bilirubin, UA: NEGATIVE
Glucose, UA: NEGATIVE
Ketones, UA: NEGATIVE
Leukocytes, UA: NEGATIVE
Nitrite, UA: NEGATIVE
Spec Grav, UA: >=1.03
Urobilinogen, UA: 1
pH, UA: 6

## 2013-10-24 MED ORDER — SUMATRIPTAN SUCCINATE 50 MG PO TABS: ORAL_TABLET | ORAL | Status: DC

## 2013-10-24 MED ORDER — CIPROFLOXACIN HCL 250 MG PO TABS: 250.0000 mg | ORAL_TABLET | Freq: Two times a day (BID) | ORAL | Status: DC

## 2013-10-24 NOTE — Progress Notes (Signed)
Patient ID: Sarah Moran, female   DOB: 08/28/1981, 33 y.o.   MRN: [id number]    Subjective:    Patient ID: Sarah Moran, female    DOB: Jun 19, 1981, 33 y.o.   MRN: [id number]  HPI  [name] is a pleasant 33 y/o female who presents to clinic for f/u migraine HA recently started on imitrex. She is also experiencing urinary frequency and wonders if she may have an infection. She denies dysuria or hematuria. She does not have a fever.   Past Medical History  Diagnosis Date  . Thyroid cancer 2007    s/p thyroidectomy  . Frequent headaches   . Hypothyroidism     s/p thyroidectomy - levothyroxine 75 mcg daily  . Allergy   . GERD (gastroesophageal reflux disease)     well controlled. Takes gingerroot when symptoms  . Migraine     takes excedrin. She has tried imitrex in the past   . IBS (irritable bowel syndrome)     constipation - takes miralax daily    Current Outpatient Prescriptions on File Prior to Visit  Medication Sig Dispense Refill  . APRI 0.15-30 MG-MCG tablet Take 1 tablet by mouth daily.       . B Complex-C (SUPER B COMPLEX PO) Take by mouth.      . Cholecalciferol (VITAMIN D) 2000 UNITS tablet Take 2,000 Units by mouth daily.      . flintstones complete (FLINTSTONES) 60 MG chewable tablet Chew 1 tablet by mouth daily.      Marland Kitchen levothyroxine (SYNTHROID, LEVOTHROID) 75 MCG tablet Take 75 mcg by mouth daily before breakfast.      . loratadine (CLARITIN) 10 MG tablet Take 10 mg by mouth daily.      . polyethylene glycol (MIRALAX / GLYCOLAX) packet Take 17 g by mouth daily.       No current facility-administered medications on file prior to visit.     Review of Systems  Constitutional: Negative.   HENT: Negative.   Eyes: Negative.   Respiratory: Negative.   Cardiovascular: Negative.   Gastrointestinal: Negative.   Endocrine: Negative.   Genitourinary: Positive for frequency. Negative for dysuria and flank pain.  Musculoskeletal: Negative.   Skin: Negative.     Allergic/Immunologic: Negative.   Neurological: Negative.        Migraines have improved  Hematological: Negative.   Psychiatric/Behavioral: Negative.        Objective:  BP 100/60  Pulse 79  Resp 12  Wt 131 lb (59.421 kg)  SpO2 97%   Physical Exam  Constitutional: She is oriented to person, place, and time. She appears well-developed and well-nourished. No distress.  HENT:  Head: Normocephalic and atraumatic.  Eyes: Conjunctivae and EOM are normal.  Neck: Normal range of motion. Neck supple. No tracheal deviation present.  Cardiovascular: Normal rate, regular rhythm, normal heart sounds and intact distal pulses.  Exam reveals no gallop and no friction rub.   No murmur heard. Pulmonary/Chest: Effort normal and breath sounds normal. No respiratory distress. She has no wheezes. She has no rales.  Abdominal: She exhibits no mass. There is no guarding.  Genitourinary:  No suprapubic discomfort. No costovertebral angle tenderness  Musculoskeletal: Normal range of motion. She exhibits no edema.  Neurological: She is alert and oriented to person, place, and time. She has normal reflexes. No cranial nerve deficit. Coordination normal.  Skin: Skin is warm and dry.  Psychiatric: She has a normal mood and affect. Her behavior is normal. Judgment and thought content normal.  Assessment & Plan:   1. Urinary frequency Start Cipro 250 mg bid x 3 day.  - POCT Urinalysis Dipstick - Urinalysis, Routine w reflex microscopic - Urine Culture  2. Microscopic hematuria UA shows large amt of blood. Confirm with urinalysis. If RBC present will have her repeat urinalysis 3 days after completing antibiotic.  - Urinalysis, Routine w reflex microscopic - Urinalysis, Routine w reflex microscopic; Future  3. Migraine Started imitrex with excellent results. Refills sent to pharmacy. Continue to follow.

## 2013-10-24 NOTE — Progress Notes (Signed)
Pre visit review using our clinic review tool, if applicable. No additional management support is needed unless otherwise documented below in the visit note. 

## 2013-10-24 NOTE — Patient Instructions (Signed)
  We will contact you once you're results are available.  Start Cipro 250 mg twice a day for 3 days.  Return to clinic 3 days after finishing antibiotic for repeat urinalysis.  Prescription sent to your pharmacy for Imitrex.

## 2013-10-25 LAB — URINE CULTURE
Colony Count: NO GROWTH
Organism ID, Bacteria: NO GROWTH

## 2013-10-29 ENCOUNTER — Other Ambulatory Visit (INDEPENDENT_AMBULATORY_CARE_PROVIDER_SITE_OTHER): Payer: BC Managed Care – PPO

## 2013-10-29 DIAGNOSIS — R3129 Other microscopic hematuria: Secondary | ICD-10-CM

## 2013-10-29 LAB — URINALYSIS, ROUTINE W REFLEX MICROSCOPIC
Bilirubin Urine: NEGATIVE
Ketones, ur: NEGATIVE
Leukocytes, UA: NEGATIVE
Nitrite: NEGATIVE
Specific Gravity, Urine: 1.01 (ref 1.000–1.030)
Total Protein, Urine: NEGATIVE
Urine Glucose: NEGATIVE
Urobilinogen, UA: 0.2 (ref 0.0–1.0)
pH: 7.5 (ref 5.0–8.0)

## 2013-11-13 ENCOUNTER — Encounter: Payer: Self-pay | Admitting: Adult Health

## 2013-11-13 ENCOUNTER — Ambulatory Visit: Payer: BC Managed Care – PPO | Admitting: Adult Health

## 2013-11-13 ENCOUNTER — Ambulatory Visit (INDEPENDENT_AMBULATORY_CARE_PROVIDER_SITE_OTHER): Payer: BC Managed Care – PPO | Admitting: Adult Health

## 2013-11-13 ENCOUNTER — Encounter (INDEPENDENT_AMBULATORY_CARE_PROVIDER_SITE_OTHER): Payer: Self-pay

## 2013-11-13 VITALS — BP 98/62 | HR 70 | Temp 98.4°F | Resp 14 | Wt 133.0 lb

## 2013-11-13 DIAGNOSIS — J329 Chronic sinusitis, unspecified: Secondary | ICD-10-CM

## 2013-11-13 MED ORDER — AMOXICILLIN-POT CLAVULANATE 875-125 MG PO TABS: 1.0000 | ORAL_TABLET | Freq: Two times a day (BID) | ORAL | Status: DC

## 2013-11-13 NOTE — Progress Notes (Signed)
Pre visit review using our clinic review tool, if applicable. No additional management support is needed unless otherwise documented below in the visit note. 

## 2013-11-13 NOTE — Progress Notes (Signed)
   Subjective:    Patient ID: Sarah Moran, female    DOB: 06/14/1981, 33 y.o.   MRN: [id number]  HPI  Sarah Moran is a pleasant 33 y/o female who presents to clinic with sinus symptoms. She reports having a cold. She subsequently developed sinus symptoms that have been ongoing for greater than 7 days. Sinsus congestion, chest congestion, post nasal drip, HA, she reports green drainage. She has been doing the sinus rinse. She has also been taken Mucinex. She has not noticed any fever.  Current Outpatient Prescriptions on File Prior to Visit  Medication Sig Dispense Refill  . APRI 0.15-30 MG-MCG tablet Take 1 tablet by mouth daily.       . B Complex-C (SUPER B COMPLEX PO) Take by mouth.      . Cholecalciferol (VITAMIN D) 2000 UNITS tablet Take 2,000 Units by mouth daily.      . flintstones complete (FLINTSTONES) 60 MG chewable tablet Chew 1 tablet by mouth daily.      Marland Kitchen levothyroxine (SYNTHROID, LEVOTHROID) 75 MCG tablet Take 75 mcg by mouth daily before breakfast.      . loratadine (CLARITIN) 10 MG tablet Take 10 mg by mouth daily.      . polyethylene glycol (MIRALAX / GLYCOLAX) packet Take 17 g by mouth daily.      . SUMAtriptan (IMITREX) 50 MG tablet May repeat in 2 hours if headache persists or recurs.  10 tablet  11   No current facility-administered medications on file prior to visit.    Review of Systems  Constitutional: Negative for fever and chills.  HENT: Positive for congestion, ear pain, postnasal drip, rhinorrhea, sinus pressure and sneezing. Negative for sore throat.   Respiratory: Positive for cough. Negative for shortness of breath and wheezing.   Musculoskeletal:       Swollen and painful right hand s/p injury  Neurological: Positive for headaches.  All other systems reviewed and are negative.       Objective:   Physical Exam  Constitutional: She is oriented to person, place, and time. She appears well-developed and well-nourished. No distress.  HENT:  Head:  Normocephalic and atraumatic.  Left Ear: External ear normal.  Right ear canal irritated. TM intact. Pharyngeal erythema without exudate  Cardiovascular: Normal rate and regular rhythm.   Pulmonary/Chest: Effort normal and breath sounds normal. No respiratory distress. She has no wheezes. She has no rales.  Musculoskeletal: Normal range of motion.  Lymphadenopathy:    She has cervical adenopathy.  Neurological: She is oriented to person, place, and time.  Skin: Skin is warm and dry.  Psychiatric: She has a normal mood and affect. Her behavior is normal. Judgment and thought content normal.       Assessment & Plan:    1. Sinusitis Start augmentin bid x 7 days. Nasonex 2 sprays into each nostril Continue supportive care. Irrigate sinuses prn RTC if no improvement within 4-5 days.

## 2013-11-13 NOTE — Patient Instructions (Signed)
  Start augmentin twice a day for 7 days.  Continue the sinus rinse.  Start Nasonex 2 sprays into each nostril daily.  You may take tylenol for any general discomfort.  If no improvement within 4-5 days please call the office.

## 2013-12-10 ENCOUNTER — Ambulatory Visit: Payer: Self-pay | Admitting: Oncology

## 2013-12-11 ENCOUNTER — Telehealth: Payer: Self-pay | Admitting: Emergency Medicine

## 2013-12-11 ENCOUNTER — Other Ambulatory Visit: Payer: Self-pay | Admitting: Adult Health

## 2013-12-11 DIAGNOSIS — E039 Hypothyroidism, unspecified: Secondary | ICD-10-CM

## 2013-12-11 LAB — TSH: Thyroid Stimulating Horm: 11.7 u[IU]/mL — ABNORMAL HIGH

## 2013-12-11 NOTE — Telephone Encounter (Signed)
Tia from [facility], Dr. Oliva Bustard office called to request that patient has f/u labs here in our office 6 weeks from today, they would like for a tsh and t4 to be done on 5/6. Pt on schedule, just need orders.

## 2013-12-18 ENCOUNTER — Ambulatory Visit: Payer: Self-pay | Admitting: Oncology

## 2014-01-22 ENCOUNTER — Other Ambulatory Visit (INDEPENDENT_AMBULATORY_CARE_PROVIDER_SITE_OTHER): Payer: BC Managed Care – PPO

## 2014-01-22 DIAGNOSIS — E039 Hypothyroidism, unspecified: Secondary | ICD-10-CM

## 2014-01-22 LAB — TSH: TSH: 8.03 u[IU]/mL — ABNORMAL HIGH (ref 0.35–4.50)

## 2014-01-23 LAB — T4: T4, Total: 12.7 ug/dL — ABNORMAL HIGH (ref 5.0–12.5)

## 2014-01-29 ENCOUNTER — Other Ambulatory Visit: Payer: Self-pay | Admitting: Adult Health

## 2014-01-29 ENCOUNTER — Telehealth: Payer: Self-pay

## 2014-01-29 MED ORDER — LEVOTHYROXINE SODIUM 100 MCG PO TABS: 100.0000 ug | ORAL_TABLET | Freq: Every day | ORAL | Status: DC

## 2014-01-29 NOTE — Progress Notes (Signed)
TSH 8.03 - she is currently on 75 mcg levothyroxine. Dose increased to 100 mcg daily before breakfast. Sent to pharmacy.

## 2014-01-29 NOTE — Telephone Encounter (Signed)
Sarah Moran asked me to fax TSH lab results to Dr. Marcelle Moran nurse at the cancer center and to asked if that provider want Sarah Moran to follow this patients's thyroid function form now on. I never received a call back with the answer. I just spoke with the patient and she told me that Sarah Moran should be following her thyroid levels at this point she has been released from Dr. Marcelle Moran care. TSH was 8.03 on 01/22/14.

## 2014-01-30 ENCOUNTER — Other Ambulatory Visit: Payer: Self-pay

## 2014-01-30 MED ORDER — LEVOTHYROXINE SODIUM 100 MCG PO TABS: 100.0000 ug | ORAL_TABLET | Freq: Every day | ORAL | Status: DC

## 2014-01-30 NOTE — Progress Notes (Signed)
Notified patient of change in medication and which pharmacy it was sent to ([facility]). Patient verbalized understanding and requested that the medication be sent to the [place]. Resent the medication to [facility] and called [facility] to notify them that patient will not be picking the medication up from there.

## 2014-03-31 ENCOUNTER — Ambulatory Visit (INDEPENDENT_AMBULATORY_CARE_PROVIDER_SITE_OTHER): Payer: BC Managed Care – PPO | Admitting: Adult Health

## 2014-03-31 ENCOUNTER — Other Ambulatory Visit: Payer: Self-pay | Admitting: Adult Health

## 2014-03-31 ENCOUNTER — Encounter: Payer: Self-pay | Admitting: Adult Health

## 2014-03-31 VITALS — BP 106/74 | HR 70 | Temp 98.1°F | Resp 14 | Ht 60.75 in | Wt 131.2 lb

## 2014-03-31 DIAGNOSIS — E039 Hypothyroidism, unspecified: Secondary | ICD-10-CM

## 2014-03-31 DIAGNOSIS — E890 Postprocedural hypothyroidism: Secondary | ICD-10-CM

## 2014-03-31 DIAGNOSIS — Z13220 Encounter for screening for lipoid disorders: Secondary | ICD-10-CM

## 2014-03-31 LAB — LIPID PANEL
Cholesterol: 166 mg/dL (ref 0–200)
HDL: 44.6 mg/dL (ref >39.00)
LDL Cholesterol: 95 mg/dL (ref 0–99)
NonHDL: 121.4
Total CHOL/HDL Ratio: 4
Triglycerides: 132 mg/dL (ref 0.0–149.0)
VLDL: 26.4 mg/dL (ref 0.0–40.0)

## 2014-03-31 LAB — BASIC METABOLIC PANEL
BUN: 9 mg/dL (ref 6–23)
CO2: 25 mEq/L (ref 19–32)
Calcium: 8.8 mg/dL (ref 8.4–10.5)
Chloride: 111 mEq/L (ref 96–112)
Creatinine, Ser: 0.7 mg/dL (ref 0.4–1.2)
GFR: 96.45 mL/min (ref >60.00)
Glucose, Bld: 90 mg/dL (ref 70–99)
Potassium: 4.3 mEq/L (ref 3.5–5.1)
Sodium: 139 mEq/L (ref 135–145)

## 2014-03-31 LAB — TSH: TSH: 0.44 u[IU]/mL (ref 0.35–4.50)

## 2014-03-31 MED ORDER — LEVOTHYROXINE SODIUM 88 MCG PO TABS: 88.0000 ug | ORAL_TABLET | Freq: Every day | ORAL | Status: DC

## 2014-03-31 NOTE — Progress Notes (Signed)
Pre visit review using our clinic review tool, if applicable. No additional management support is needed unless otherwise documented below in the visit note. 

## 2014-03-31 NOTE — Patient Instructions (Signed)
  Please have labs drawn.  I will contact you with results and send in your thyroid medication.   Remember to schedule your yearly physical exam.  Have a great summer.

## 2014-03-31 NOTE — Progress Notes (Signed)
Lowering levothyroxine dose slightly to 88 mcg daily. Return to clinic (RTC) for follow up in 3 months.

## 2014-03-31 NOTE — Progress Notes (Signed)
Patient ID: Sarah Moran, female   DOB: January 01, 1981, 33 y.o.   MRN: [id number]   Subjective:    Patient ID: Sarah Moran, female    DOB: [birth year], 33 y.o.   MRN: [id number]  HPI  Pt is a pleasant 33 y/o female with hx of thyroid cancer s/p thyroidectomy on levothyroxine. Pt is here for her thyroid follow up as well as screening for HLD. She is feeling well.   Past Medical History  Diagnosis Date  . Thyroid cancer 2007    s/p thyroidectomy  . Frequent headaches   . Hypothyroidism     s/p thyroidectomy - levothyroxine 75 mcg daily  . Allergy   . GERD (gastroesophageal reflux disease)     well controlled. Takes gingerroot when symptoms  . Migraine     takes excedrin. She has tried imitrex in the past   . IBS (irritable bowel syndrome)     constipation - takes miralax daily    Current Outpatient Prescriptions on File Prior to Visit  Medication Sig Dispense Refill  . APRI 0.15-30 MG-MCG tablet Take 1 tablet by mouth daily.       . B Complex-C (SUPER B COMPLEX PO) Take by mouth.      . Cholecalciferol (VITAMIN D) 2000 UNITS tablet Take 2,000 Units by mouth daily.      . flintstones complete (FLINTSTONES) 60 MG chewable tablet Chew 1 tablet by mouth daily.      Marland Kitchen levothyroxine (SYNTHROID, LEVOTHROID) 100 MCG tablet Take 1 tablet (100 mcg total) by mouth daily.  30 tablet  1  . loratadine (CLARITIN) 10 MG tablet Take 10 mg by mouth daily.      . polyethylene glycol (MIRALAX / GLYCOLAX) packet Take 17 g by mouth daily.      . SUMAtriptan (IMITREX) 50 MG tablet May repeat in 2 hours if headache persists or recurs.  10 tablet  11   No current facility-administered medications on file prior to visit.     Review of Systems  Constitutional: Negative.   HENT: Negative.   Eyes: Negative.   Respiratory: Negative.   Cardiovascular: Negative.   Gastrointestinal: Negative.   Endocrine: Negative.   Genitourinary: Negative.   Musculoskeletal: Negative.   Skin: Negative.     Allergic/Immunologic: Negative.   Neurological: Negative.   Hematological: Negative.   Psychiatric/Behavioral: Negative.        Objective:  BP 106/74  Pulse 70  Temp(Src) 98.1 F (36.7 C) (Oral)  Resp 14  Ht 5' 0.75" (1.543 m)  Wt 131 lb 4 oz (59.535 kg)  BMI 25.01 kg/m2  SpO2 100%   Physical Exam  Constitutional: She is oriented to person, place, and time. She appears well-developed and well-nourished. No distress.  HENT:  Head: Normocephalic and atraumatic.  Right Ear: External ear normal.  Left Ear: External ear normal.  Nose: Nose normal.  Eyes: Conjunctivae and EOM are normal.  Neck: Normal range of motion. Neck supple. No tracheal deviation present. No thyromegaly present.  Cardiovascular: Normal rate, regular rhythm, normal heart sounds and intact distal pulses.  Exam reveals no gallop and no friction rub.   No murmur heard. Pulmonary/Chest: Effort normal and breath sounds normal. No respiratory distress. She has no wheezes. She has no rales.  Abdominal: She exhibits no mass.  Musculoskeletal: Normal range of motion. She exhibits no edema and no tenderness.  Lymphadenopathy:    She has no cervical adenopathy.  Neurological: She is alert and oriented to person, place, and time. No  cranial nerve deficit. Coordination normal.  Skin: Skin is warm and dry.  Psychiatric: She has a normal mood and affect. Her behavior is normal. Judgment and thought content normal.      Assessment & Plan:   1. Postoperative hypothyroidism Check labs. Refill meds once results in. Continue to follow - TSH - Basic metabolic panel  2. Screening cholesterol level Check cholesterol levels. Follow - Lipid panel

## 2014-04-04 ENCOUNTER — Other Ambulatory Visit: Payer: Self-pay

## 2014-04-04 ENCOUNTER — Telehealth: Payer: Self-pay | Admitting: *Deleted

## 2014-04-04 MED ORDER — LEVOTHYROXINE SODIUM 100 MCG PO TABS: 100.0000 ug | ORAL_TABLET | Freq: Every day | ORAL | Status: DC

## 2014-04-04 MED ORDER — LEVOTHYROXINE SODIUM 88 MCG PO TABS: 88.0000 ug | ORAL_TABLET | Freq: Every day | ORAL | Status: DC

## 2014-04-04 NOTE — Telephone Encounter (Signed)
Spoke with pharmacist regarding thyroid medication. Pt was on 100 mcg & went to pharmacy to pick up refill today & it was 88 mcg. After discussing with Dr. Gilford Rile and reviewing recent TSH, pt & pharmacist was advised to continue the 100 mcg & repeat TSH in 6 weeks.

## 2014-04-05 ENCOUNTER — Other Ambulatory Visit: Payer: Self-pay | Admitting: Adult Health

## 2014-04-05 DIAGNOSIS — E890 Postprocedural hypothyroidism: Secondary | ICD-10-CM

## 2014-04-15 ENCOUNTER — Ambulatory Visit: Payer: Self-pay | Admitting: Adult Health

## 2014-04-15 LAB — HM MAMMOGRAPHY: HM Mammogram: NEGATIVE

## 2014-04-15 IMAGING — MG MM DIGITAL SCREENING BILAT W/ CAD
4 series · 4 of 4 positions shown · non-contrast
Comparison: None.

CLINICAL DATA: Screening.

EXAM:
DIGITAL SCREENING BILATERAL MAMMOGRAM WITH CAD

[R MLO]
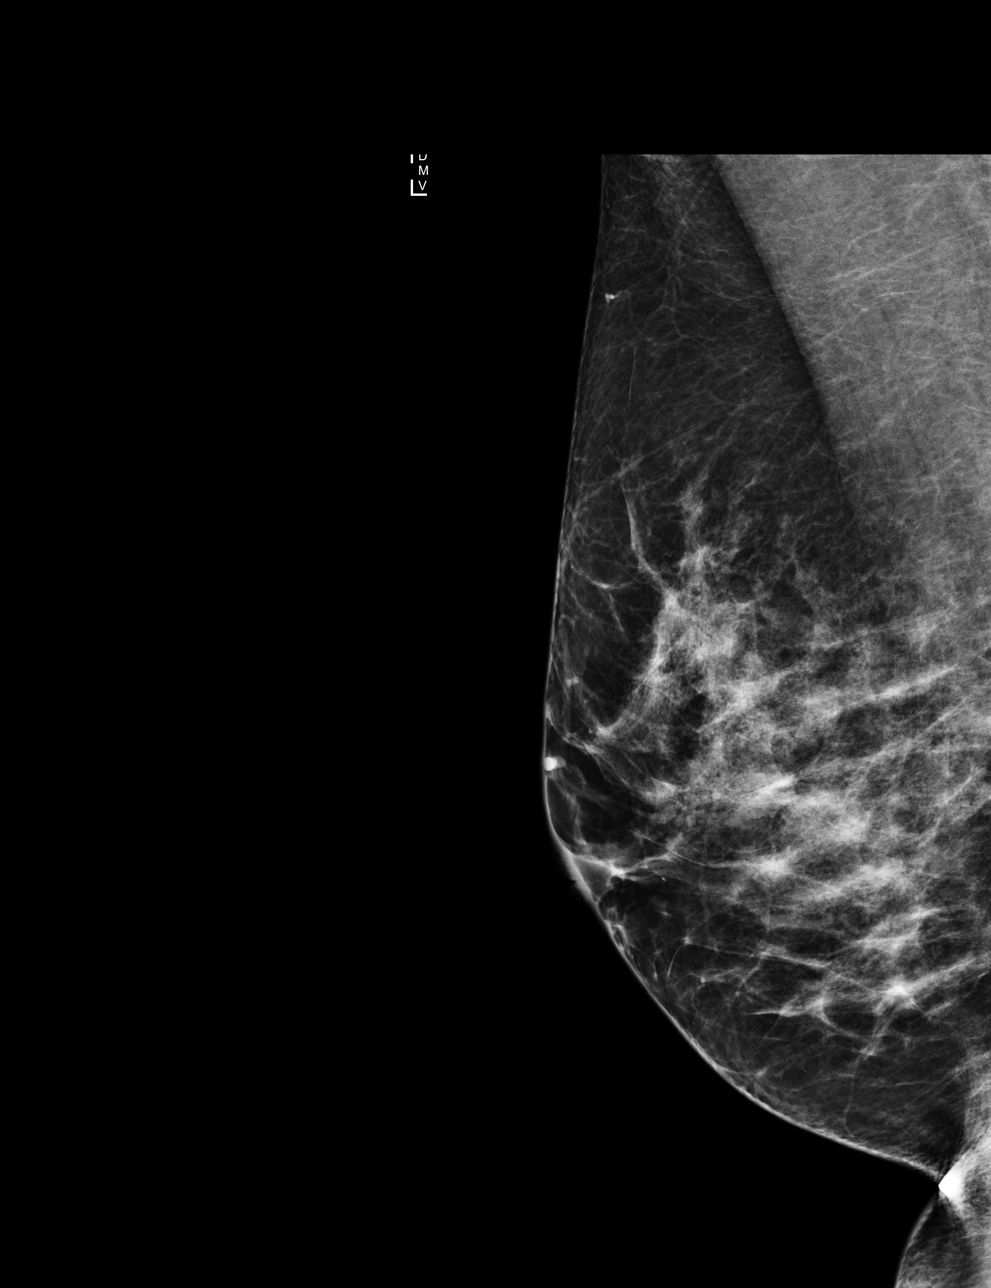

[L MLO]
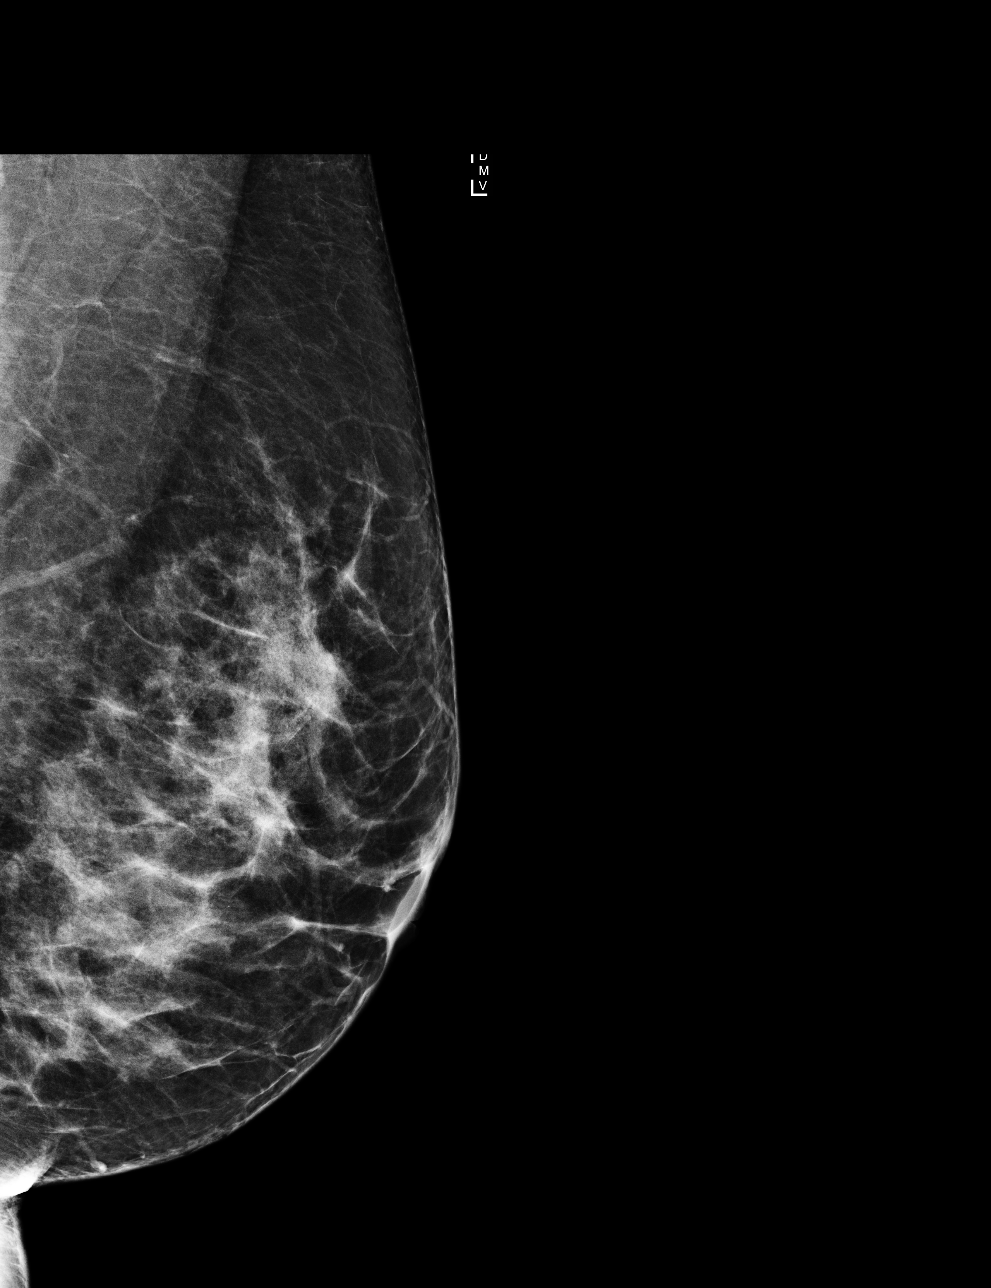

[R CC]
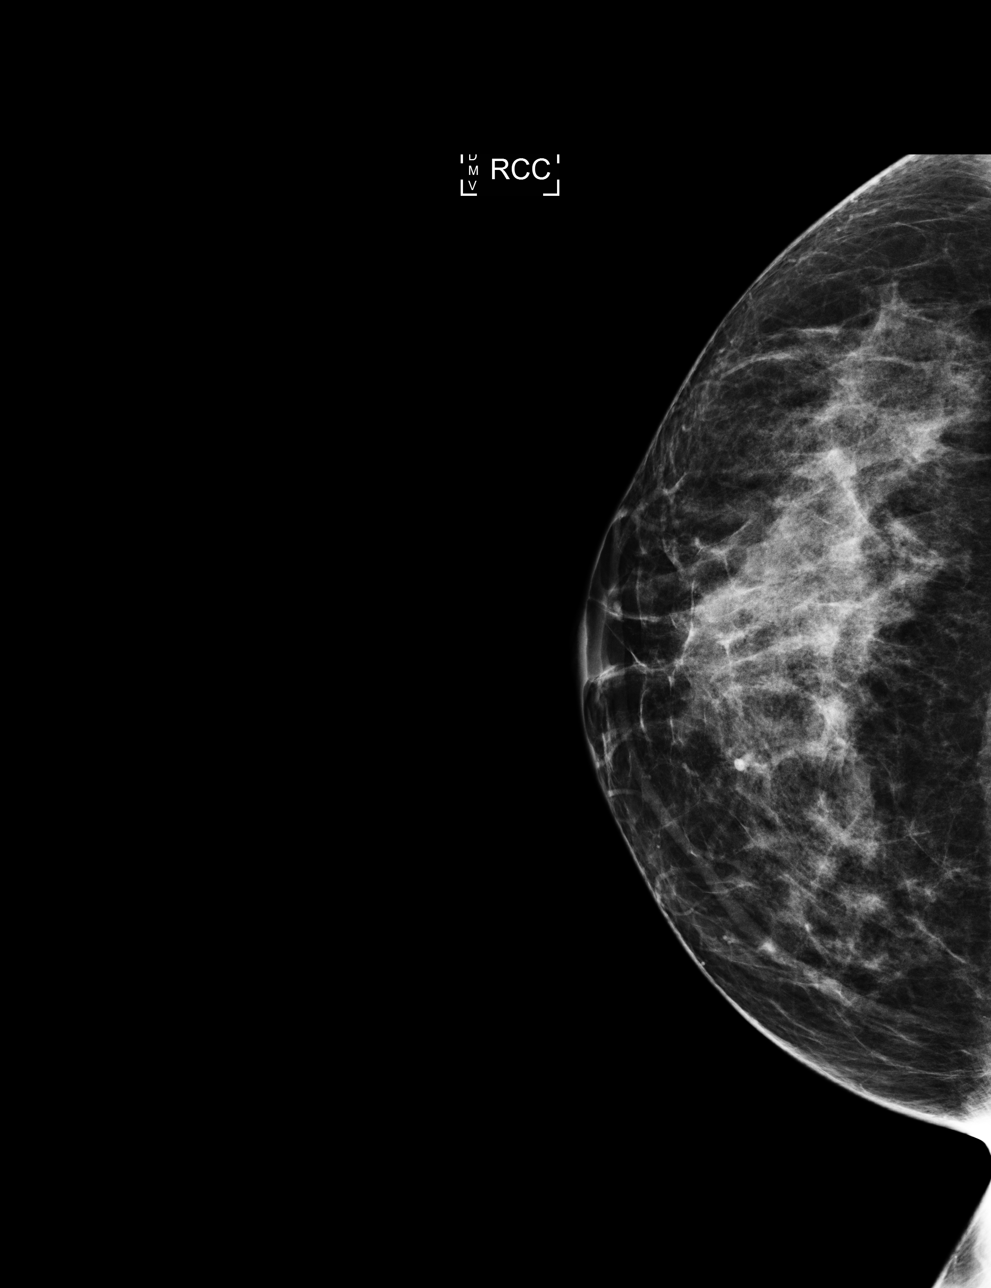

[L CC]
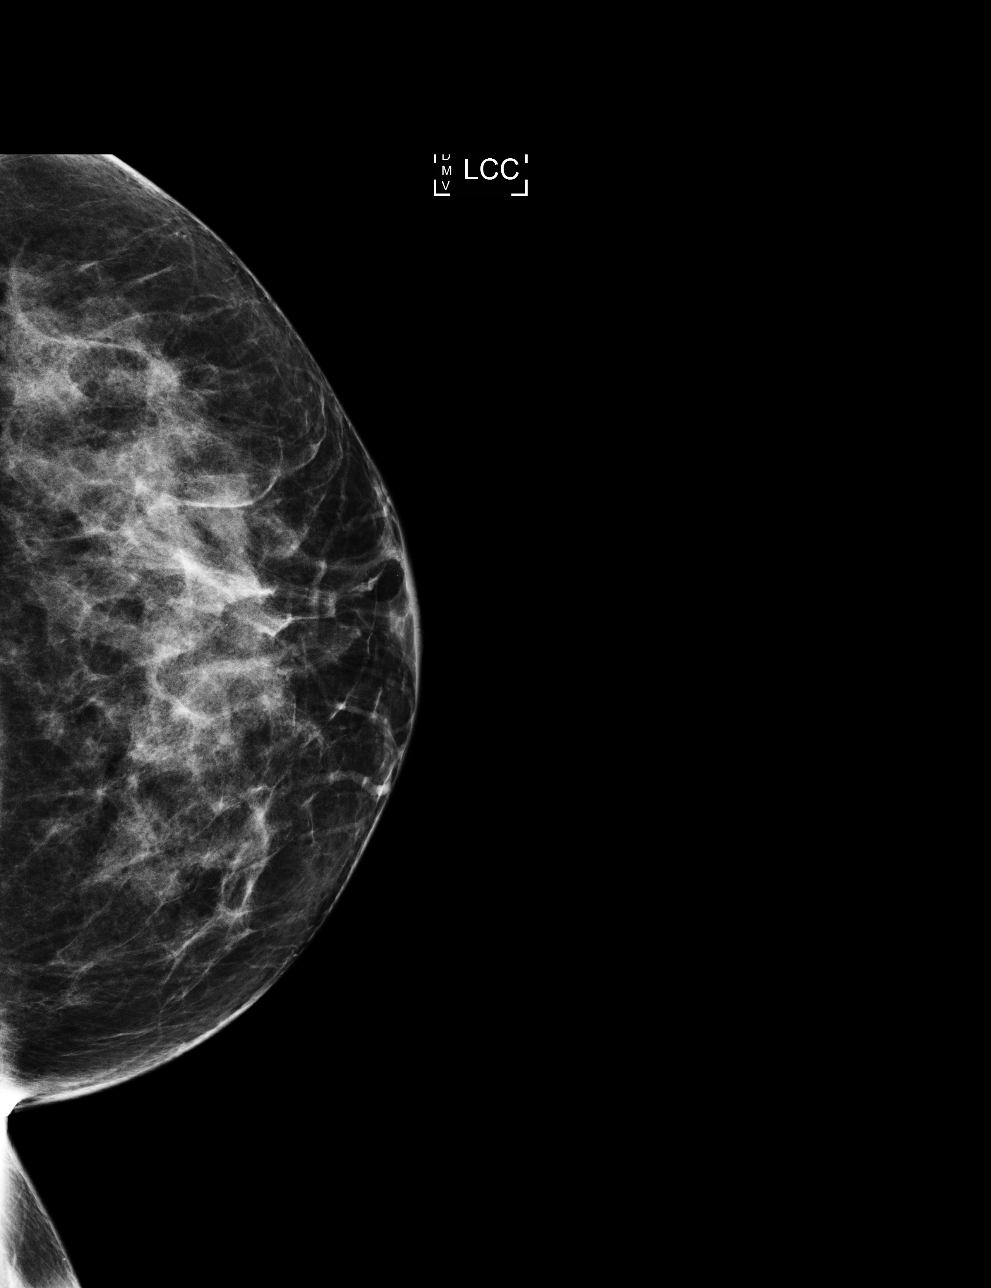

[4 of 4 positions shown; findings below may reference images not displayed]

ACR Breast Density Category c: The breast tissue is heterogeneously
dense, which may obscure small masses
FINDINGS: There are no findings suspicious for malignancy. Images were
processed with CAD.
IMPRESSION: No mammographic evidence of malignancy. A result letter of this
screening mammogram will be mailed directly to the patient.

RECOMMENDATION:
Screening mammogram at age 38. (Code:[RG])

BI-RADS CATEGORY  1: Negative.

## 2014-04-16 ENCOUNTER — Encounter: Payer: Self-pay | Admitting: *Deleted

## 2014-06-27 ENCOUNTER — Ambulatory Visit (INDEPENDENT_AMBULATORY_CARE_PROVIDER_SITE_OTHER): Payer: BC Managed Care – PPO | Admitting: Internal Medicine

## 2014-06-27 ENCOUNTER — Other Ambulatory Visit: Payer: Self-pay | Admitting: *Deleted

## 2014-06-27 ENCOUNTER — Encounter: Payer: Self-pay | Admitting: Internal Medicine

## 2014-06-27 VITALS — BP 114/80 | HR 68 | Temp 98.0°F | Ht 60.75 in | Wt 124.2 lb

## 2014-06-27 DIAGNOSIS — R350 Frequency of micturition: Secondary | ICD-10-CM | POA: Insufficient documentation

## 2014-06-27 DIAGNOSIS — E039 Hypothyroidism, unspecified: Secondary | ICD-10-CM | POA: Insufficient documentation

## 2014-06-27 DIAGNOSIS — G43909 Migraine, unspecified, not intractable, without status migrainosus: Secondary | ICD-10-CM

## 2014-06-27 DIAGNOSIS — Z85850 Personal history of malignant neoplasm of thyroid: Secondary | ICD-10-CM | POA: Insufficient documentation

## 2014-06-27 LAB — POCT URINALYSIS DIPSTICK
Bilirubin, UA: NEGATIVE
Glucose, UA: NEGATIVE
Ketones, UA: NEGATIVE
Leukocytes, UA: NEGATIVE
Nitrite, UA: NEGATIVE
Protein, UA: NEGATIVE
Spec Grav, UA: 1.01
Urobilinogen, UA: 0.2
pH, UA: 6

## 2014-06-27 LAB — URINALYSIS, ROUTINE W REFLEX MICROSCOPIC
Bilirubin Urine: NEGATIVE
Ketones, ur: NEGATIVE
Leukocytes, UA: NEGATIVE
Nitrite: NEGATIVE
Specific Gravity, Urine: 1.015 (ref 1.000–1.030)
Total Protein, Urine: NEGATIVE
Urine Glucose: NEGATIVE
Urobilinogen, UA: 0.2 (ref 0.0–1.0)
pH: 6 (ref 5.0–8.0)

## 2014-06-27 LAB — TSH: TSH: 0.36 u[IU]/mL (ref 0.35–4.50)

## 2014-06-27 LAB — T4, FREE: Free T4: 1.28 ng/dL (ref 0.60–1.60)

## 2014-06-27 MED ORDER — LEVOTHYROXINE SODIUM 100 MCG PO TABS: 100.0000 ug | ORAL_TABLET | Freq: Every day | ORAL | Status: DC

## 2014-06-27 NOTE — Progress Notes (Signed)
Pre visit review using our clinic review tool, if applicable. No additional management support is needed unless otherwise documented below in the visit note. 

## 2014-06-27 NOTE — Assessment & Plan Note (Signed)
Recent worsening migraines, occurring during placebo pills on OCP. Will have her take OCP continuously, skipping placebo pills, as she is s/p hysterectomy. OCP used to control ovarian cyst. Continue prn Imitrex.

## 2014-06-27 NOTE — Assessment & Plan Note (Signed)
Reviewed incomplete note from oncology from 05/2013. Will check TSH and Free T4 with labs today.

## 2014-06-27 NOTE — Progress Notes (Signed)
Subjective:    Patient ID: Sarah Moran, female    DOB: 07-21-1981, 33 y.o.   MRN: [id number]  HPI 33YO female presents for follow up. Previously seen by NP, Sarah Moran.  Migraines - having migraines during placebo pills on birth control. Takes Imitrex during migraines with some improvement.  Hypothyroidism - Compliant with medications.  Having some restless legs and arms at night, questions if this is related.  Notes some urinary frequency over last few weeks. Some burning with urination. No fever or flank pain.  Review of Systems  Constitutional: Negative for fever, chills, appetite change, fatigue and unexpected weight change.  Eyes: Negative for visual disturbance.  Respiratory: Negative for shortness of breath.   Cardiovascular: Negative for chest pain and leg swelling.  Gastrointestinal: Negative for nausea, vomiting, abdominal pain, diarrhea and constipation.  Genitourinary: Positive for dysuria, urgency and frequency. Negative for hematuria, flank pain and pelvic pain.  Skin: Negative for color change and rash.  Neurological: Positive for headaches.  Hematological: Negative for adenopathy. Does not bruise/bleed easily.  Psychiatric/Behavioral: Negative for dysphoric mood. The patient is not nervous/anxious.        Objective:    BP 114/80  Pulse 68  Temp(Src) 98 F (36.7 C) (Oral)  Ht 5' 0.75" (1.543 m)  Wt 124 lb 4 oz (56.359 kg)  BMI 23.67 kg/m2  SpO2 98% Physical Exam  Constitutional: She is oriented to person, place, and time. She appears well-developed and well-nourished. No distress.  HENT:  Head: Normocephalic and atraumatic.  Right Ear: External ear normal.  Left Ear: External ear normal.  Nose: Nose normal.  Mouth/Throat: Oropharynx is clear and moist. No oropharyngeal exudate.  Eyes: Conjunctivae are normal. Pupils are equal, round, and reactive to light. Right eye exhibits no discharge. Left eye exhibits no discharge. No scleral icterus.  Neck:  Trachea normal and normal range of motion. Neck supple. No tracheal deviation present. No mass and no thyromegaly present.  Cardiovascular: Normal rate, regular rhythm, normal heart sounds and intact distal pulses.  Exam reveals no gallop and no friction rub.   No murmur heard. Pulmonary/Chest: Effort normal and breath sounds normal. No accessory muscle usage. Not tachypneic. No respiratory distress. She has no decreased breath sounds. She has no wheezes. She has no rhonchi. She has no rales. She exhibits no tenderness.  Musculoskeletal: Normal range of motion. She exhibits no edema and no tenderness.  Lymphadenopathy:    She has no cervical adenopathy.  Neurological: She is alert and oriented to person, place, and time. No cranial nerve deficit. She exhibits normal muscle tone. Coordination normal.  Skin: Skin is warm and dry. No rash noted. She is not diaphoretic. No erythema. No pallor.  Psychiatric: She has a normal mood and affect. Her behavior is normal. Judgment and thought content normal.          Assessment & Plan:   Problem List Items Addressed This Visit     Unprioritized   History of thyroid cancer     Reviewed incomplete note from oncology from 05/2013. Will check TSH and Free T4 with labs today.    Hypothyroidism     Will check TSH and Free T4 today. Recent significant variability in thyroid levels may be related to taking medication with coffee/milk. Reviewed appropriate way to take Levothyroxine.    Relevant Orders      T4, free      TSH   Migraine - Primary     Recent worsening migraines, occurring during  placebo pills on OCP. Will have her take OCP continuously, skipping placebo pills, as she is s/p hysterectomy. OCP used to control ovarian cyst. Continue prn Imitrex.    Urinary frequency     Chronic urinary frequency. Hematuria noted on previous UA in 03/2014. Will repeat UA today.    Relevant Orders      POCT Urinalysis Dipstick       Return in about 6  months (around 12/27/2014) for Physical.

## 2014-06-27 NOTE — Patient Instructions (Addendum)
Try skipping placebo pills on birth control each month to help prevent migraines.  Labs today.

## 2014-06-27 NOTE — Addendum Note (Signed)
Addended by: Karlene Einstein D on: 06/27/2014 10:28 AM   Modules accepted: Orders

## 2014-06-27 NOTE — Assessment & Plan Note (Signed)
Chronic urinary frequency. Hematuria noted on previous UA in 03/2014. Will repeat UA today.

## 2014-06-27 NOTE — Assessment & Plan Note (Addendum)
Will check TSH and Free T4 today. Recent significant variability in thyroid levels may be related to taking medication with coffee/milk. Reviewed appropriate way to take Levothyroxine.

## 2014-06-28 LAB — URINE CULTURE
Colony Count: NO GROWTH
Organism ID, Bacteria: NO GROWTH

## 2014-07-16 ENCOUNTER — Encounter: Payer: Self-pay | Admitting: Internal Medicine

## 2014-07-16 ENCOUNTER — Ambulatory Visit (INDEPENDENT_AMBULATORY_CARE_PROVIDER_SITE_OTHER): Payer: BC Managed Care – PPO | Admitting: Internal Medicine

## 2014-07-16 VITALS — BP 120/80 | HR 83 | Temp 98.0°F | Wt 123.5 lb

## 2014-07-16 DIAGNOSIS — S29012A Strain of muscle and tendon of back wall of thorax, initial encounter: Secondary | ICD-10-CM

## 2014-07-16 MED ORDER — CYCLOBENZAPRINE HCL 5 MG PO TABS: 5.0000 mg | ORAL_TABLET | Freq: Three times a day (TID) | ORAL | Status: DC | PRN

## 2014-07-16 NOTE — Progress Notes (Signed)
Pre visit review using our clinic review tool, if applicable. No additional management support is needed unless otherwise documented below in the visit note. 

## 2014-07-16 NOTE — Patient Instructions (Addendum)
Back Exercises These exercises may help you when beginning to rehabilitate your injury. Your symptoms may resolve with or without further involvement from your physician, physical therapist or athletic trainer. While completing these exercises, remember:   Restoring tissue flexibility helps normal motion to return to the joints. This allows healthier, less painful movement and activity.  An effective stretch should be held for at least 30 seconds.  A stretch should never be painful. You should only feel a gentle lengthening or release in the stretched tissue. STRETCH - Extension, Prone on Elbows   Lie on your stomach on the floor, a bed will be too soft. Place your palms about shoulder width apart and at the height of your head.  Place your elbows under your shoulders. If this is too painful, stack pillows under your chest.  Allow your body to relax so that your hips drop lower and make contact more completely with the floor.  Hold this position for __________ seconds.  Slowly return to lying flat on the floor. Repeat __________ times. Complete this exercise __________ times per day.  RANGE OF MOTION - Extension, Prone Press Ups   Lie on your stomach on the floor, a bed will be too soft. Place your palms about shoulder width apart and at the height of your head.  Keeping your back as relaxed as possible, slowly straighten your elbows while keeping your hips on the floor. You may adjust the placement of your hands to maximize your comfort. As you gain motion, your hands will come more underneath your shoulders.  Hold this position __________ seconds.  Slowly return to lying flat on the floor. Repeat __________ times. Complete this exercise __________ times per day.  RANGE OF MOTION- Quadruped, Neutral Spine   Assume a hands and knees position on a firm surface. Keep your hands under your shoulders and your knees under your hips. You may place padding under your knees for  comfort.  Drop your head and point your tail bone toward the ground below you. This will round out your low back like an angry cat. Hold this position for __________ seconds.  Slowly lift your head and release your tail bone so that your back sags into a large arch, like an old horse.  Hold this position for __________ seconds.  Repeat this until you feel limber in your low back.  Now, find your "sweet spot." This will be the most comfortable position somewhere between the two previous positions. This is your neutral spine. Once you have found this position, tense your stomach muscles to support your low back.  Hold this position for __________ seconds. Repeat __________ times. Complete this exercise __________ times per day.  STRETCH - Flexion, Single Knee to Chest   Lie on a firm bed or floor with both legs extended in front of you.  Keeping one leg in contact with the floor, bring your opposite knee to your chest. Hold your leg in place by either grabbing behind your thigh or at your knee.  Pull until you feel a gentle stretch in your low back. Hold __________ seconds.  Slowly release your grasp and repeat the exercise with the opposite side. Repeat __________ times. Complete this exercise __________ times per day.  STRETCH - Hamstrings, Standing  Stand or sit and extend your right / left leg, placing your foot on a chair or foot stool  Keeping a slight arch in your low back and your hips straight forward.  Lead with your chest and   lean forward at the waist until you feel a gentle stretch in the back of your right / left knee or thigh. (When done correctly, this exercise requires leaning only a small distance.)  Hold this position for __________ seconds. Repeat __________ times. Complete this stretch __________ times per day. STRENGTHENING - Deep Abdominals, Pelvic Tilt   Lie on a firm bed or floor. Keeping your legs in front of you, bend your knees so they are both pointed  toward the ceiling and your feet are flat on the floor.  Tense your lower abdominal muscles to press your low back into the floor. This motion will rotate your pelvis so that your tail bone is scooping upwards rather than pointing at your feet or into the floor.  With a gentle tension and even breathing, hold this position for __________ seconds. Repeat __________ times. Complete this exercise __________ times per day.  STRENGTHENING - Abdominals, Crunches   Lie on a firm bed or floor. Keeping your legs in front of you, bend your knees so they are both pointed toward the ceiling and your feet are flat on the floor. Cross your arms over your chest.  Slightly tip your chin down without bending your neck.  Tense your abdominals and slowly lift your trunk high enough to just clear your shoulder blades. Lifting higher can put excessive stress on the low back and does not further strengthen your abdominal muscles.  Control your return to the starting position. Repeat __________ times. Complete this exercise __________ times per day.  STRENGTHENING - Quadruped, Opposite UE/LE Lift   Assume a hands and knees position on a firm surface. Keep your hands under your shoulders and your knees under your hips. You may place padding under your knees for comfort.  Find your neutral spine and gently tense your abdominal muscles so that you can maintain this position. Your shoulders and hips should form a rectangle that is parallel with the floor and is not twisted.  Keeping your trunk steady, lift your right hand no higher than your shoulder and then your left leg no higher than your hip. Make sure you are not holding your breath. Hold this position __________ seconds.  Continuing to keep your abdominal muscles tense and your back steady, slowly return to your starting position. Repeat with the opposite arm and leg. Repeat __________ times. Complete this exercise __________ times per day. Document Released:  09/23/2005 Document Revised: 11/28/2011 Document Reviewed: 12/18/2008 Exit[employer] Patient Information 2015 [employer]. This information is not intended to replace advice given to you by your health care provider. Make sure you discuss any questions you have with your health care provider.  

## 2014-07-16 NOTE — Progress Notes (Signed)
Subjective:    Patient ID: Sarah Moran, female    DOB: 08-30-1981, 33 y.o.   MRN: [id number]  HPI  Pt presents to the clinic today with c/o shoulder pain. She reports this started. It starts in her right shoulder and radiates ups to her neck and down to her mid back. The pain is constant. She describes it as achy and burning. She does not recall any injury to the area. She has tried a heating pad without relief. She has been putting "horse rub" on it with some relief. She does have a remote history of back surgery due to scoliosis. She reports she has not had any evaluation of her spine in about 18 years.  Review of Systems      Past Medical History  Diagnosis Date  . Thyroid cancer 2007    s/p thyroidectomy  . Frequent headaches   . Hypothyroidism     s/p thyroidectomy - levothyroxine 75 mcg daily  . Allergy   . GERD (gastroesophageal reflux disease)     well controlled. Takes gingerroot when symptoms  . Migraine     takes excedrin. She has tried imitrex in the past   . IBS (irritable bowel syndrome)     constipation - takes miralax daily    Current Outpatient Prescriptions  Medication Sig Dispense Refill  . APRI 0.15-30 MG-MCG tablet Take 1 tablet by mouth daily.       . Cholecalciferol (VITAMIN D) 2000 UNITS tablet Take 2,000 Units by mouth daily.      . COD LIVER OIL PO Take by mouth.      . levothyroxine (SYNTHROID, LEVOTHROID) 100 MCG tablet Take 1 tablet (100 mcg total) by mouth daily before breakfast.  30 tablet  11  . loratadine (CLARITIN) 10 MG tablet Take 10 mg by mouth daily.      . polyethylene glycol (MIRALAX / GLYCOLAX) packet Take 17 g by mouth daily.      . SUMAtriptan (IMITREX) 50 MG tablet May repeat in 2 hours if headache persists or recurs.  10 tablet  11   No current facility-administered medications for this visit.    Allergies  Allergen Reactions  . Darvon [Propoxyphene] Other (See Comments)    Hallucinations   . Percocet  [Oxycodone-Acetaminophen] Hives  . Vicodin [Hydrocodone-Acetaminophen] Hives    Family History  Problem Relation Age of Onset  . Hyperlipidemia Father   . Hypertension Father   . Clotting disorder Sister   . Hepatitis B Brother   . Cancer Paternal Grandmother     breast cancer? with mets  . Cancer Mother     Cervical Cancer  . Cancer Maternal Aunt     breast cancer  . Cancer Maternal Aunt     throat cancer  . Cancer Maternal Aunt     breast w/mets    History   Social History  . Marital Status: Married    Spouse Name: Sarah Moran    Number of Children: 2  . Years of Education: 12   Occupational History  . Stay at home mom    Social History Main Topics  . Smoking status: Never Smoker   . Smokeless tobacco: Never Used  . Alcohol Use: Yes     Comment: Occasionally - very rarely  . Drug Use: No  . Sexual Activity: Yes    Birth Control/ Protection: Surgical   Other Topics Concern  . Not on file   Social History Narrative   Sarah Moran grew up in  [place]. She currently lives in [place] with her husband, Sarah Moran, and their 2 sons (Sarah Moran age 62 and Sarah Moran age 20). 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 Sarah Moran belongs to the [employer]. She enjoys outdoor activities on her spare time.     Constitutional: Denies fever, malaise, fatigue, headache or abrupt weight changes.  Respiratory: Denies difficulty breathing, shortness of breath, cough or sputum production.   Cardiovascular: Denies chest pain, chest tightness, palpitations or swelling in the hands or feet.  Musculoskeletal: Pt reports right shoulder/back pain. Denies difficulty with gait or joint swelling.  Skin: Denies redness, rashes, lesions or ulcercations.   No other specific complaints in a complete review of systems (except as listed in HPI above).  Objective:   Physical Exam   BP 120/80  Pulse 83  Temp(Src) 98 F (36.7 C) (Oral)  Wt 123 lb 8 oz  (56.019 kg)  SpO2 99% Wt Readings from Last 3 Encounters:  07/16/14 123 lb 8 oz (56.019 kg)  06/27/14 124 lb 4 oz (56.359 kg)  03/31/14 131 lb 4 oz (59.535 kg)    General: Appears her stated age, well developed, well nourished in NAD.  Cardiovascular: Normal rate and rhythm. S1,S2 noted.  No murmur, rubs or gallops noted.  Pulmonary/Chest: Normal effort and positive vesicular breath sounds. No respiratory distress. No wheezes, rales or ronchi noted.  Musculoskeletal: Normal internal and external rotation of the right shoulder. Pain with flexion and extension of the neck. Pain with palpation of the muscle over the right shoulder blade. No pain with palpation of the cervical or thoracic spine.     BMET    Component Value Date/Time   NA 139 03/31/2014 0949   K 4.3 03/31/2014 0949   CL 111 03/31/2014 0949   CO2 25 03/31/2014 0949   GLUCOSE 90 03/31/2014 0949   BUN 9 03/31/2014 0949   CREATININE 0.7 03/31/2014 0949   CALCIUM 8.8 03/31/2014 0949    Lipid Panel     Component Value Date/Time   CHOL 166 03/31/2014 0949   TRIG 132.0 03/31/2014 0949   HDL 44.60 03/31/2014 0949   CHOLHDL 4 03/31/2014 0949   VLDL 26.4 03/31/2014 0949   LDLCALC 95 03/31/2014 0949    CBC No results found for this basename: wbc, rbc, hgb, hct, plt, mcv, mch, mchc, rdw, neutrabs, lymphsabs, monoabs, eosabs, basosabs    Hgb A1C No results found for this basename: HGBA1C        Assessment & Plan:   Muscle strain right upper back:  erx for flexeril TID prn Take Advil 600 mg TID prn OTC for inflammation The heating pad may be helpful Stretching exercises given  She questions referral to ortho to have scoliosis checked- advised her if symptoms do not improve in 2-3 weeks, we can place referral to ortho for further evaluation  RTC as needed or if symptoms persist or worsen

## 2014-10-07 ENCOUNTER — Ambulatory Visit (INDEPENDENT_AMBULATORY_CARE_PROVIDER_SITE_OTHER): Payer: BLUE CROSS/BLUE SHIELD | Admitting: Family Medicine

## 2014-10-07 ENCOUNTER — Encounter: Payer: Self-pay | Admitting: Family Medicine

## 2014-10-07 VITALS — BP 102/66 | HR 97 | Temp 98.1°F | Ht 60.75 in | Wt 128.5 lb

## 2014-10-07 DIAGNOSIS — J209 Acute bronchitis, unspecified: Secondary | ICD-10-CM

## 2014-10-07 MED ORDER — AZITHROMYCIN 250 MG PO TABS: ORAL_TABLET | ORAL | Status: DC

## 2014-10-07 MED ORDER — GUAIFENESIN-CODEINE 100-10 MG/5ML PO SYRP: 5.0000 mL | ORAL_SOLUTION | Freq: Every evening | ORAL | Status: DC | PRN

## 2014-10-07 NOTE — Progress Notes (Signed)
Pre visit review using our clinic review tool, if applicable. No additional management support is needed unless otherwise documented below in the visit note. 

## 2014-10-07 NOTE — Assessment & Plan Note (Signed)
>   2 weeks of symptoms not responding to mucolytic. Treat with antibitoics, cough suppressant prescription.

## 2014-10-07 NOTE — Progress Notes (Signed)
   Subjective:    Patient ID: Sarah Moran, female    DOB: 1981/01/29, 34 y.o.   MRN: [id number]  Sinus Problem This is a new problem. The current episode started 1 to 4 weeks ago. The problem has been gradually worsening since onset. There has been no fever. The pain is moderate. Associated symptoms include congestion, coughing, headaches, sinus pressure and a sore throat. Pertinent negatives include no ear pain, hoarse voice, neck pain, shortness of breath or sneezing. (Productive green mucus) Treatments tried: mucinex DM, nasal irrigation. The treatment provided mild relief.   Nonsmoker, she has history of sinus issues. She takes claritin for allergies.  Cough keeping her upat night.    Review of Systems  HENT: Positive for congestion, sinus pressure and sore throat. Negative for ear pain, hoarse voice and sneezing.   Respiratory: Positive for cough. Negative for shortness of breath.   Musculoskeletal: Negative for neck pain.  Neurological: Positive for headaches.       Objective:   Physical Exam  Constitutional: Vital signs are normal. She appears well-developed and well-nourished. She is cooperative.  Non-toxic appearance. She does not appear ill. No distress.  HENT:  Head: Normocephalic.  Right Ear: Hearing, tympanic membrane, external ear and ear canal normal. Tympanic membrane is not erythematous, not retracted and not bulging.  Left Ear: Hearing, tympanic membrane, external ear and ear canal normal. Tympanic membrane is not erythematous, not retracted and not bulging.  Nose: Mucosal edema and rhinorrhea present. Right sinus exhibits no maxillary sinus tenderness and no frontal sinus tenderness. Left sinus exhibits maxillary sinus tenderness. Left sinus exhibits no frontal sinus tenderness.  Mouth/Throat: Uvula is midline, oropharynx is clear and moist and mucous membranes are normal.  Eyes: Conjunctivae, EOM and lids are normal. Pupils are equal, round, and reactive to light.  Lids are everted and swept, no foreign bodies found.  Neck: Trachea normal and normal range of motion. Neck supple. Carotid bruit is not present. No thyroid mass and no thyromegaly present.  Cardiovascular: Normal rate, regular rhythm, S1 normal, S2 normal, normal heart sounds, intact distal pulses and normal pulses.  Exam reveals no gallop and no friction rub.   No murmur heard. Pulmonary/Chest: Effort normal and breath sounds normal. No tachypnea. No respiratory distress. She has no decreased breath sounds. She has no wheezes. She has no rhonchi. She has no rales.  Neurological: She is alert.  Skin: Skin is warm, dry and intact. No rash noted.  Psychiatric: Her speech is normal and behavior is normal. Judgment normal. Her mood appears not anxious. Cognition and memory are normal. She does not exhibit a depressed mood.          Assessment & Plan:

## 2014-10-07 NOTE — Patient Instructions (Signed)
Complete antibiotics.  Continue mucinex DM, can use prescription cough suppressant at night. Continue nasal rinse, claritin.

## 2014-12-09 ENCOUNTER — Ambulatory Visit (INDEPENDENT_AMBULATORY_CARE_PROVIDER_SITE_OTHER): Payer: BLUE CROSS/BLUE SHIELD | Admitting: Nurse Practitioner

## 2014-12-09 ENCOUNTER — Encounter: Payer: Self-pay | Admitting: Nurse Practitioner

## 2014-12-09 VITALS — BP 102/64 | HR 87 | Resp 14 | Ht 60.75 in | Wt 129.2 lb

## 2014-12-09 DIAGNOSIS — S29012A Strain of muscle and tendon of back wall of thorax, initial encounter: Secondary | ICD-10-CM

## 2014-12-09 DIAGNOSIS — E039 Hypothyroidism, unspecified: Secondary | ICD-10-CM

## 2014-12-09 DIAGNOSIS — G43909 Migraine, unspecified, not intractable, without status migrainosus: Secondary | ICD-10-CM

## 2014-12-09 LAB — T4, FREE: Free T4: 0.97 ng/dL (ref 0.60–1.60)

## 2014-12-09 LAB — TSH: TSH: 0.43 u[IU]/mL (ref 0.35–4.50)

## 2014-12-09 MED ORDER — CYCLOBENZAPRINE HCL 5 MG PO TABS: 5.0000 mg | ORAL_TABLET | Freq: Three times a day (TID) | ORAL | Status: DC | PRN

## 2014-12-09 MED ORDER — SUMATRIPTAN SUCCINATE 50 MG PO TABS: ORAL_TABLET | ORAL | Status: DC

## 2014-12-09 NOTE — Patient Instructions (Addendum)
See you in July for fasting labs (nothing to eat or drink after midnight- black coffee or water okay).   We will get thyroid labs today.

## 2014-12-09 NOTE — Progress Notes (Signed)
Subjective:    Patient ID: Sarah Moran, female    DOB: April 15, 1981, 34 y.o.   MRN: [id number]  HPI  [name] is a 34 yo female here for a follow up on thyroid.   1) Imitrex refill- last used few months ago. Related to period- hormones   2) Thyroid- last checked in Oct. Normal. Taking Levothyroxine on empty stomach currently.   3) Stress- waking up 4-5 x a night, mother had surgery and she feels she is worried about her. Easily going back to sleep after awakenings, worried about traveling to [place] this weekend to see her mother, Benadryl at night- helps with allergies and sleep.  Clariitn during the day for the allergies, also.   Review of Systems  Constitutional: Negative for fever, chills, diaphoresis and fatigue.  Respiratory: Negative for chest tightness, shortness of breath and wheezing.   Cardiovascular: Negative for chest pain, palpitations and leg swelling.  Gastrointestinal: Negative for nausea, vomiting, diarrhea and rectal pain.  Skin: Negative for rash.  Neurological: Negative for dizziness, weakness, numbness and headaches.  Psychiatric/Behavioral: The patient is not nervous/anxious.    Past Medical History  Diagnosis Date  . Thyroid cancer 2007    s/p thyroidectomy  . Frequent headaches   . Hypothyroidism     s/p thyroidectomy - levothyroxine 75 mcg daily  . Allergy   . GERD (gastroesophageal reflux disease)     well controlled. Takes gingerroot when symptoms  . Migraine     takes excedrin. She has tried imitrex in the past   . IBS (irritable bowel syndrome)     constipation - takes miralax daily    History   Social History  . Marital Status: Married    Spouse Name: Audelia Acton  . Number of Children: 2  . Years of Education: 12   Occupational History  . Stay at home mom    Social History Main Topics  . Smoking status: Never Smoker   . Smokeless tobacco: Never Used  . Alcohol Use: Yes     Comment: Occasionally - very rarely  . Drug Use: No  . Sexual  Activity: Yes    Birth Control/ Protection: Surgical   Other Topics Concern  . Not on file   Social History Narrative   Siri grew up in [place]. She currently lives in [place] with her husband, Audelia Acton, and their 2 sons ([place] age 44 and Jaci Standard age 52). 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 Sarah Moran belongs to the [employer]. She enjoys outdoor activities on her spare time.    Past Surgical History  Procedure Laterality Date  . Back surgery  1997    scoliosis  . Hernia repair  1990    bilateral inguinal  . Thyroidectomy  2007    thyroid cancer  . Total abdominal hysterectomy  2008    dymenorrhea    Family History  Problem Relation Age of Onset  . Hyperlipidemia Father   . Hypertension Father   . Clotting disorder Sister   . Hepatitis B Brother   . Cancer Paternal Grandmother     breast cancer? with mets  . Cancer Mother     Cervical Cancer  . Cancer Maternal Aunt     breast cancer  . Cancer Maternal Aunt     throat cancer  . Cancer Maternal Aunt     breast w/mets    Allergies  Allergen Reactions  . Darvon [Propoxyphene] Other (  See Comments)    Hallucinations   . Percocet [Oxycodone-Acetaminophen] Hives  . Vicodin [Hydrocodone-Acetaminophen] Hives    Current Outpatient Prescriptions on File Prior to Visit  Medication Sig Dispense Refill  . APRI 0.15-30 MG-MCG tablet Take 1 tablet by mouth daily.     . Cholecalciferol (VITAMIN D) 2000 UNITS tablet Take 2,000 Units by mouth daily.    . COD LIVER OIL PO Take by mouth.    . levothyroxine (SYNTHROID, LEVOTHROID) 100 MCG tablet Take 1 tablet (100 mcg total) by mouth daily before breakfast. 30 tablet 11  . loratadine (CLARITIN) 10 MG tablet Take 10 mg by mouth daily.    . polyethylene glycol (MIRALAX / GLYCOLAX) packet Take 17 g by mouth daily.    . vitamin C (ASCORBIC ACID) 500 MG tablet Take 500 mg by mouth daily.     No current  facility-administered medications on file prior to visit.       Objective:   Physical Exam  Constitutional: She is oriented to person, place, and time. She appears well-developed and well-nourished. No distress.  BP 102/64 mmHg  Pulse 87  Resp 14  Ht 5' 0.75" (1.543 m)  Wt 129 lb 4 oz (58.627 kg)  BMI 24.62 kg/m2  SpO2 98%   HENT:  Head: Normocephalic and atraumatic.  Right Ear: External ear normal.  Left Ear: External ear normal.  Eyes: Right eye exhibits no discharge. Left eye exhibits no discharge. No scleral icterus.  Neck: Normal range of motion. Neck supple. No thyromegaly present.  Cardiovascular: Normal rate, regular rhythm, normal heart sounds and intact distal pulses.  Exam reveals no gallop and no friction rub.   No murmur heard. Pulmonary/Chest: Effort normal and breath sounds normal. No respiratory distress. She has no wheezes. She has no rales. She exhibits no tenderness.  Lymphadenopathy:    She has no cervical adenopathy.  Neurological: She is alert and oriented to person, place, and time. No cranial nerve deficit. She exhibits normal muscle tone. Coordination normal.  Skin: Skin is warm and dry. No rash noted. She is not diaphoretic.  Psychiatric: She has a normal mood and affect. Her behavior is normal. Judgment and thought content normal.      Assessment & Plan:

## 2014-12-09 NOTE — Progress Notes (Signed)
Pre visit review using our clinic review tool, if applicable. No additional management support is needed unless otherwise documented below in the visit note. 

## 2014-12-10 ENCOUNTER — Encounter: Payer: Self-pay | Admitting: *Deleted

## 2014-12-10 DIAGNOSIS — S29012A Strain of muscle and tendon of back wall of thorax, initial encounter: Secondary | ICD-10-CM | POA: Insufficient documentation

## 2014-12-10 NOTE — Assessment & Plan Note (Signed)
Will send flexeril 5 mg refill into pharmacy for muscle strain continuing symptoms and also to help with sleep over the period of her trip.

## 2014-12-10 NOTE — Assessment & Plan Note (Signed)
Imitrex refilled. Menstrual related migraines.

## 2014-12-10 NOTE — Assessment & Plan Note (Signed)
Will check TSH and free T4 today. Pt is taking Levothyroxine appropriately. Pt feels she not sleeping as well and we discussed it is probably due to stress and not thyroid. FU in June for other labs.

## 2015-04-08 ENCOUNTER — Telehealth: Payer: Self-pay | Admitting: *Deleted

## 2015-04-08 ENCOUNTER — Other Ambulatory Visit (INDEPENDENT_AMBULATORY_CARE_PROVIDER_SITE_OTHER): Payer: BLUE CROSS/BLUE SHIELD

## 2015-04-08 DIAGNOSIS — E785 Hyperlipidemia, unspecified: Secondary | ICD-10-CM

## 2015-04-08 DIAGNOSIS — E039 Hypothyroidism, unspecified: Secondary | ICD-10-CM

## 2015-04-08 DIAGNOSIS — Z130 Encounter for screening for diseases of the blood and blood-forming organs and certain disorders involving the immune mechanism: Secondary | ICD-10-CM

## 2015-04-08 DIAGNOSIS — Z131 Encounter for screening for diabetes mellitus: Secondary | ICD-10-CM | POA: Diagnosis not present

## 2015-04-08 NOTE — Telephone Encounter (Signed)
Labs and dX?  

## 2015-04-08 NOTE — Telephone Encounter (Signed)
Was she fasting? CMET, Lipid profile, TSH, CBC w/ diff, and A1c  Dx: Screen for hyperlipidemia, hypothyroidism, screen for anemia, screen for diabetes.

## 2015-04-09 LAB — CBC WITH DIFFERENTIAL/PLATELET
Basophils Absolute: 0 10*3/uL (ref 0.0–0.1)
Basophils Relative: 0.6 % (ref 0.0–3.0)
Eosinophils Absolute: 0.1 10*3/uL (ref 0.0–0.7)
Eosinophils Relative: 1.8 % (ref 0.0–5.0)
HCT: 40.7 % (ref 36.0–46.0)
Hemoglobin: 13.7 g/dL (ref 12.0–15.0)
Lymphocytes Relative: 27.1 % (ref 12.0–46.0)
Lymphs Abs: 1.5 10*3/uL (ref 0.7–4.0)
MCHC: 33.7 g/dL (ref 30.0–36.0)
MCV: 88.3 fl (ref 78.0–100.0)
Monocytes Absolute: 0.2 10*3/uL (ref 0.1–1.0)
Monocytes Relative: 4.4 % (ref 3.0–12.0)
Neutro Abs: 3.6 10*3/uL (ref 1.4–7.7)
Neutrophils Relative %: 66.1 % (ref 43.0–77.0)
Platelets: 310 10*3/uL (ref 150.0–400.0)
RBC: 4.61 Mil/uL (ref 3.87–5.11)
RDW: 13.6 % (ref 11.5–15.5)
WBC: 5.5 10*3/uL (ref 4.0–10.5)

## 2015-04-09 LAB — LIPID PANEL
Cholesterol: 209 mg/dL — ABNORMAL HIGH (ref 0–200)
HDL: 53.6 mg/dL (ref >39.00)
LDL Cholesterol: 121 mg/dL — ABNORMAL HIGH (ref 0–99)
NonHDL: 155.4
Total CHOL/HDL Ratio: 4
Triglycerides: 174 mg/dL — ABNORMAL HIGH (ref 0.0–149.0)
VLDL: 34.8 mg/dL (ref 0.0–40.0)

## 2015-04-09 LAB — COMPREHENSIVE METABOLIC PANEL
ALT: 10 U/L (ref 0–35)
AST: 18 U/L (ref 0–37)
Albumin: 3.7 g/dL (ref 3.5–5.2)
Alkaline Phosphatase: 50 U/L (ref 39–117)
BUN: 9 mg/dL (ref 6–23)
CO2: 28 mEq/L (ref 19–32)
Calcium: 9.2 mg/dL (ref 8.4–10.5)
Chloride: 106 mEq/L (ref 96–112)
Creatinine, Ser: 0.84 mg/dL (ref 0.40–1.20)
GFR: 81.54 mL/min (ref >60.00)
Glucose, Bld: 97 mg/dL (ref 70–99)
Potassium: 4.5 mEq/L (ref 3.5–5.1)
Sodium: 141 mEq/L (ref 135–145)
Total Bilirubin: 0.4 mg/dL (ref 0.2–1.2)
Total Protein: 6.2 g/dL (ref 6.0–8.3)

## 2015-04-09 LAB — TSH: TSH: 0.48 u[IU]/mL (ref 0.35–4.50)

## 2015-04-09 LAB — HEMOGLOBIN A1C: Hgb A1c MFr Bld: 5.3 % (ref 4.6–6.5)

## 2015-04-10 ENCOUNTER — Ambulatory Visit (INDEPENDENT_AMBULATORY_CARE_PROVIDER_SITE_OTHER): Payer: BLUE CROSS/BLUE SHIELD | Admitting: Nurse Practitioner

## 2015-04-10 VITALS — BP 118/72 | HR 73 | Temp 98.1°F | Resp 14 | Ht 61.0 in | Wt 131.0 lb

## 2015-04-10 DIAGNOSIS — S29012A Strain of muscle and tendon of back wall of thorax, initial encounter: Secondary | ICD-10-CM | POA: Diagnosis not present

## 2015-04-10 DIAGNOSIS — J3489 Other specified disorders of nose and nasal sinuses: Secondary | ICD-10-CM | POA: Diagnosis not present

## 2015-04-10 MED ORDER — CYCLOBENZAPRINE HCL 5 MG PO TABS: 5.0000 mg | ORAL_TABLET | Freq: Every day | ORAL | Status: DC

## 2015-04-10 MED ORDER — METHYLPREDNISOLONE 4 MG PO TABS: ORAL_TABLET | ORAL | Status: DC

## 2015-04-10 NOTE — Progress Notes (Signed)
Pre visit review using our clinic review tool, if applicable. No additional management support is needed unless otherwise documented below in the visit note. 

## 2015-04-10 NOTE — Patient Instructions (Addendum)
Switch from Claritin to Allegra   Check into getting massage therapy through insurance.   Prednisone with breakfast  6 tablets on day 1, 5 tablets on day 2, 4 tablets on day 3, 3 tablets on day 4, 2 tablets day 5, 1 tablet on day 6...done!    Do not take NSAIDs with this (Aspirin, advil, ibuprofen, aleve)   Cut down on sweets and dairy.

## 2015-04-10 NOTE — Progress Notes (Signed)
   Subjective:    Patient ID: Sarah Moran, female    DOB: 03/07/1981, 34 y.o.   MRN: [id number]  HPI  Sarah Moran is a 34 yo female with a CC of headache follow up.   1) Using ibuprofen and imitrex as needed for headaches  4-5 days in evening time having headaches.  Claritin- not working  Nasal rinse- somewhat helpful  Flonase- somewhat helpful  Mucinex DM- helpful somewhat   2) Muscle tension- neck and shoulders  Flexeril- helpful  Tennis ball between shoulder blades  Walking 1-2 x a week 30-45 min.   Review of Systems  Constitutional: Negative for fever, chills, diaphoresis and fatigue.  HENT: Negative for tinnitus and trouble swallowing.   Eyes: Positive for discharge, redness and itching. Negative for pain and visual disturbance.       Watery discharge  Respiratory: Negative for chest tightness, shortness of breath and wheezing.   Cardiovascular: Negative for chest pain, palpitations and leg swelling.  Gastrointestinal: Negative for nausea, vomiting and diarrhea.  Skin: Negative for rash.  Neurological: Negative for dizziness, weakness, numbness and headaches.  Psychiatric/Behavioral: The patient is not nervous/anxious.       Objective:   Physical Exam  Constitutional: She is oriented to person, place, and time. She appears well-developed and well-nourished. No distress.  BP 118/72 mmHg  Pulse 73  Temp(Src) 98.1 F (36.7 C)  Resp 14  Ht 5\' 1"  (1.549 m)  Wt 131 lb (59.421 kg)  BMI 24.76 kg/m2  SpO2 99%   HENT:  Head: Normocephalic and atraumatic.  Right Ear: External ear normal.  Left Ear: External ear normal.  Eyes: EOM are normal. Pupils are equal, round, and reactive to light. Right eye exhibits no discharge. Left eye exhibits no discharge. No scleral icterus.  Neck: Normal range of motion. Neck supple.  Cardiovascular: Normal rate, regular rhythm and normal heart sounds.  Exam reveals no gallop and no friction rub.   No murmur heard. Pulmonary/Chest:  Effort normal and breath sounds normal. No respiratory distress. She has no wheezes. She has no rales. She exhibits no tenderness.  Musculoskeletal: She exhibits tenderness. She exhibits no edema.  Trapezius muscles are tight bilaterally  Lymphadenopathy:    She has no cervical adenopathy.  Neurological: She is alert and oriented to person, place, and time. No cranial nerve deficit. She exhibits normal muscle tone. Coordination normal.  Romberg negative  Skin: Skin is warm and dry. No rash noted. She is not diaphoretic.  Psychiatric: She has a normal mood and affect. Her behavior is normal. Judgment and thought content normal.      Assessment & Plan:

## 2015-04-22 ENCOUNTER — Encounter: Payer: Self-pay | Admitting: Nurse Practitioner

## 2015-04-22 DIAGNOSIS — J3489 Other specified disorders of nose and nasal sinuses: Secondary | ICD-10-CM | POA: Insufficient documentation

## 2015-04-22 NOTE — Assessment & Plan Note (Signed)
Will try flexeril at night for muscle spasms of shoulders. Will follow

## 2015-04-22 NOTE — Assessment & Plan Note (Signed)
Stable. Will switch to Allegra from Claritin since not working. Asked pt to try a low dose prednisone taper. FU prn worsening/failure to improve.

## 2015-06-29 ENCOUNTER — Other Ambulatory Visit: Payer: Self-pay

## 2015-06-29 ENCOUNTER — Telehealth: Payer: Self-pay | Admitting: *Deleted

## 2015-06-29 MED ORDER — LEVOTHYROXINE SODIUM 100 MCG PO TABS: 100.0000 ug | ORAL_TABLET | Freq: Every day | ORAL | Status: DC

## 2015-06-29 NOTE — Telephone Encounter (Signed)
I have refilled it.  Thanks

## 2015-06-29 NOTE — Telephone Encounter (Signed)
Patient has requested a medication refill for levothyroxine. Please use [facility] pharmacy in [place].

## 2015-07-13 ENCOUNTER — Ambulatory Visit (INDEPENDENT_AMBULATORY_CARE_PROVIDER_SITE_OTHER): Payer: BLUE CROSS/BLUE SHIELD | Admitting: Nurse Practitioner

## 2015-07-13 VITALS — BP 104/78 | HR 66 | Temp 98.2°F | Resp 12 | Ht 61.0 in | Wt 128.6 lb

## 2015-07-13 DIAGNOSIS — R300 Dysuria: Secondary | ICD-10-CM

## 2015-07-13 DIAGNOSIS — E039 Hypothyroidism, unspecified: Secondary | ICD-10-CM

## 2015-07-13 LAB — POCT URINALYSIS DIPSTICK
Bilirubin, UA: NEGATIVE
Glucose, UA: NEGATIVE
Ketones, UA: NEGATIVE
Leukocytes, UA: NEGATIVE
Nitrite, UA: NEGATIVE
Protein, UA: NEGATIVE
Spec Grav, UA: <=1.005
Urobilinogen, UA: 0.2
pH, UA: 6

## 2015-07-13 MED ORDER — CIPROFLOXACIN HCL 250 MG PO TABS: 250.0000 mg | ORAL_TABLET | Freq: Two times a day (BID) | ORAL | Status: DC

## 2015-07-13 NOTE — Progress Notes (Signed)
Pre visit review using our clinic review tool, if applicable. No additional management support is needed unless otherwise documented below in the visit note. 

## 2015-07-13 NOTE — Patient Instructions (Signed)
Please take a probiotic ( Align, Floraque or Culturelle) while you are on the antibiotic to prevent a serious antibiotic associated diarrhea  Called clostirudium dificile colitis and a vaginal yeast infection.   Cipro twice daily for 5 days.

## 2015-07-13 NOTE — Progress Notes (Signed)
Patient ID: Sarah Moran, female    DOB: 09-03-1981  Age: 34 y.o. MRN: [id number]  CC: Follow-up   HPI Sarah Moran presents for follow up on thyroid and urinary symptoms.   1) 2 weeks of dysuria, low back pain last week that has resided, pruritis, and urgency with decreased out put. Had sexual intercourse recently.  Denies treatment recently OTC  Bright orange one episode    2) Thyroid- normal in July, but wants to know when to check it again. She has been stable with her labs for 1 year.   History Sarah Moran has a past medical history of Thyroid cancer (2007); Frequent headaches; Hypothyroidism; Allergy; GERD (gastroesophageal reflux disease); Migraine; and IBS (irritable bowel syndrome).   She has past surgical history that includes Back surgery (1997); Hernia repair (1990); Thyroidectomy (2007); and Total abdominal hysterectomy (2008).   Her family history includes Cancer in her maternal aunt, maternal aunt, maternal aunt, mother, and paternal grandmother; Clotting disorder in her sister; Hepatitis B in her brother; Hyperlipidemia in her father; Hypertension in her father.She reports that she has never smoked. She has never used smokeless tobacco. She reports that she drinks alcohol. She reports that she does not use illicit drugs.  Outpatient Prescriptions Prior to Visit  Medication Sig Dispense Refill  . APRI 0.15-30 MG-MCG tablet Take 1 tablet by mouth daily.     . B Complex Vitamins (VITAMIN B COMPLEX) TABS Take 1 tablet by mouth daily.    . Cholecalciferol (VITAMIN D) 2000 UNITS tablet Take 2,000 Units by mouth daily.    . COD LIVER OIL PO Take by mouth.    . cyclobenzaprine (FLEXERIL) 5 MG tablet Take 1 tablet (5 mg total) by mouth at bedtime. 30 tablet 1  . levothyroxine (SYNTHROID, LEVOTHROID) 100 MCG tablet Take 1 tablet (100 mcg total) by mouth daily before breakfast. 30 tablet 11  . loratadine (CLARITIN) 10 MG tablet Take 10 mg by mouth daily.    . methylPREDNISolone  (MEDROL) 4 MG tablet Take 6 tablets by mouth with breakfast or lunch and decrease by 1 tablet each day until gone. (Patient not taking: Reported on 07/13/2015) 21 tablet 0  . polyethylene glycol (MIRALAX / GLYCOLAX) packet Take 17 g by mouth daily.    . SUMAtriptan (IMITREX) 50 MG tablet May repeat in 2 hours if headache persists or recurs. 10 tablet 11  . vitamin C (ASCORBIC ACID) 500 MG tablet Take 500 mg by mouth daily.     No facility-administered medications prior to visit.    ROS Review of Systems  Constitutional: Negative for fever, chills, diaphoresis and fatigue.  Genitourinary: Positive for dysuria, urgency, frequency, hematuria and flank pain. Negative for decreased urine volume, difficulty urinating and pelvic pain.       Right side  Skin: Negative for rash.    Objective:  BP 104/78 mmHg  Pulse 66  Temp(Src) 98.2 F (36.8 C)  Resp 12  Ht 5\' 1"  (1.549 m)  Wt 128 lb 9.6 oz (58.333 kg)  BMI 24.31 kg/m2  SpO2 98%  Physical Exam  Constitutional: She is oriented to person, place, and time. She appears well-developed and well-nourished. No distress.  HENT:  Head: Normocephalic and atraumatic.  Right Ear: External ear normal.  Left Ear: External ear normal.  Eyes: EOM are normal. Pupils are equal, round, and reactive to light. Right eye exhibits no discharge. Left eye exhibits no discharge. No scleral icterus.  Neck: Normal range of motion. Neck supple. No thyromegaly present.  Abdominal: There is no CVA tenderness.  Lymphadenopathy:    She has no cervical adenopathy.  Neurological: She is alert and oriented to person, place, and time.  Skin: Skin is warm and dry. No rash noted. She is not diaphoretic.  Psychiatric: She has a normal mood and affect. Her behavior is normal. Judgment and thought content normal.   Assessment & Plan:   Sarah Moran was seen today for follow-up.  Diagnoses and all orders for this visit:  Dysuria -     POCT Urinalysis Dipstick -     Urine  culture  Hypothyroidism, unspecified hypothyroidism type  Other orders -     ciprofloxacin (CIPRO) 250 MG tablet; Take 1 tablet (250 mg total) by mouth 2 (two) times daily.   I am having Sarah Moran start on ciprofloxacin. I am also having her maintain her loratadine, Vitamin D, APRI, polyethylene glycol, COD LIVER OIL PO, vitamin C, SUMAtriptan, Vitamin B Complex, cyclobenzaprine, methylPREDNISolone, levothyroxine, and fexofenadine.  Meds ordered this encounter  Medications  . fexofenadine (ALLEGRA) 30 MG tablet    Sig: Take 30 mg by mouth at bedtime as needed.  . ciprofloxacin (CIPRO) 250 MG tablet    Sig: Take 1 tablet (250 mg total) by mouth 2 (two) times daily.    Dispense:  10 tablet    Refill:  0    Order Specific Question:  Supervising Provider    Answer:  Crecencio Mc [[id number]]     Follow-up: Return in about 3 months (around 10/13/2015) for Thyroid Follow up with labs .

## 2015-07-15 LAB — URINE CULTURE
Colony Count: NO GROWTH
Organism ID, Bacteria: NO GROWTH

## 2015-07-22 ENCOUNTER — Encounter: Payer: Self-pay | Admitting: Nurse Practitioner

## 2015-07-22 DIAGNOSIS — R300 Dysuria: Secondary | ICD-10-CM | POA: Insufficient documentation

## 2015-07-22 NOTE — Assessment & Plan Note (Signed)
Pt has been stable for 1 year. Will repeat thyroid panel in 3 months.

## 2015-07-22 NOTE — Assessment & Plan Note (Signed)
POCT urine probable for UTI. Will obtain urine culture. Treating empirically cipro 250 mg twice daily for 5 days. Encouraged probiotics. FU prn worsening/failure to improve.

## 2015-09-10 ENCOUNTER — Encounter: Payer: Self-pay | Admitting: Nurse Practitioner

## 2016-02-22 DIAGNOSIS — D1801 Hemangioma of skin and subcutaneous tissue: Secondary | ICD-10-CM | POA: Diagnosis not present

## 2016-03-15 DIAGNOSIS — T8339XA Other mechanical complication of intrauterine contraceptive device, initial encounter: Secondary | ICD-10-CM | POA: Diagnosis not present

## 2016-04-01 ENCOUNTER — Other Ambulatory Visit: Payer: Self-pay | Admitting: Family Medicine

## 2016-04-01 ENCOUNTER — Ambulatory Visit (INDEPENDENT_AMBULATORY_CARE_PROVIDER_SITE_OTHER): Payer: BLUE CROSS/BLUE SHIELD | Admitting: Family Medicine

## 2016-04-01 ENCOUNTER — Encounter: Payer: Self-pay | Admitting: Family Medicine

## 2016-04-01 VITALS — BP 112/78 | HR 63 | Temp 98.1°F | Ht 61.25 in | Wt 130.4 lb

## 2016-04-01 DIAGNOSIS — Z0000 Encounter for general adult medical examination without abnormal findings: Secondary | ICD-10-CM | POA: Diagnosis not present

## 2016-04-01 DIAGNOSIS — Z23 Encounter for immunization: Secondary | ICD-10-CM

## 2016-04-01 DIAGNOSIS — Z114 Encounter for screening for human immunodeficiency virus [HIV]: Secondary | ICD-10-CM

## 2016-04-01 DIAGNOSIS — Z130 Encounter for screening for diseases of the blood and blood-forming organs and certain disorders involving the immune mechanism: Secondary | ICD-10-CM

## 2016-04-01 DIAGNOSIS — M62838 Other muscle spasm: Secondary | ICD-10-CM

## 2016-04-01 DIAGNOSIS — E890 Postprocedural hypothyroidism: Secondary | ICD-10-CM

## 2016-04-01 DIAGNOSIS — E785 Hyperlipidemia, unspecified: Secondary | ICD-10-CM

## 2016-04-01 LAB — CBC
HCT: 39.3 % (ref 36.0–46.0)
Hemoglobin: 13.2 g/dL (ref 12.0–15.0)
MCHC: 33.5 g/dL (ref 30.0–36.0)
MCV: 88.2 fl (ref 78.0–100.0)
Platelets: 308 10*3/uL (ref 150.0–400.0)
RBC: 4.46 Mil/uL (ref 3.87–5.11)
RDW: 13.1 % (ref 11.5–15.5)
WBC: 6.9 10*3/uL (ref 4.0–10.5)

## 2016-04-01 LAB — COMPREHENSIVE METABOLIC PANEL
ALT: 12 U/L (ref 0–35)
AST: 17 U/L (ref 0–37)
Albumin: 4.3 g/dL (ref 3.5–5.2)
Alkaline Phosphatase: 64 U/L (ref 39–117)
BUN: 10 mg/dL (ref 6–23)
CO2: 27 mEq/L (ref 19–32)
Calcium: 9.5 mg/dL (ref 8.4–10.5)
Chloride: 104 mEq/L (ref 96–112)
Creatinine, Ser: 0.64 mg/dL (ref 0.40–1.20)
GFR: 111 mL/min (ref >60.00)
Glucose, Bld: 101 mg/dL — ABNORMAL HIGH (ref 70–99)
Potassium: 3.9 mEq/L (ref 3.5–5.1)
Sodium: 139 mEq/L (ref 135–145)
Total Bilirubin: 0.5 mg/dL (ref 0.2–1.2)
Total Protein: 6.8 g/dL (ref 6.0–8.3)

## 2016-04-01 LAB — LIPID PANEL
Cholesterol: 176 mg/dL (ref 0–200)
HDL: 38.4 mg/dL — ABNORMAL LOW (ref >39.00)
LDL Cholesterol: 122 mg/dL — ABNORMAL HIGH (ref 0–99)
NonHDL: 138
Total CHOL/HDL Ratio: 5
Triglycerides: 78 mg/dL (ref 0.0–149.0)
VLDL: 15.6 mg/dL (ref 0.0–40.0)

## 2016-04-01 LAB — TSH: TSH: 0.05 u[IU]/mL — ABNORMAL LOW (ref 0.35–4.50)

## 2016-04-01 MED ORDER — LEVOTHYROXINE SODIUM 75 MCG PO TABS: 75.0000 ug | ORAL_TABLET | Freq: Every day | ORAL | Status: DC

## 2016-04-01 MED ORDER — LEVOTHYROXINE SODIUM 100 MCG PO TABS: 100.0000 ug | ORAL_TABLET | Freq: Every day | ORAL | Status: DC

## 2016-04-01 MED ORDER — CYCLOBENZAPRINE HCL 5 MG PO TABS: 5.0000 mg | ORAL_TABLET | Freq: Every day | ORAL | Status: DC

## 2016-04-01 MED ORDER — SUMATRIPTAN SUCCINATE 50 MG PO TABS: ORAL_TABLET | ORAL | Status: DC

## 2016-04-01 NOTE — Progress Notes (Signed)
Subjective:  Patient ID: Sarah Moran, female    DOB: June 24, 1981  Age: 35 y.o. MRN: [id number]  CC: Annual physical  HPI Sarah Moran is a 35 y.o. female presents to the clinic today for an annual physical.  Preventative Healthcare  Pap smear: No longer indicated. S/p hysterectomy for benign reasons.  Mammogram: Not indicated yet.  Immunizations  Tetanus - In need of tetanus.  Labs: Labs today. See orders.  Alcohol use: See below.  Smoking/tobacco use: See below.  STD/HIV testing: Candidate for HIV screening. Okay with this.  PMH, Surgical Hx, Family Hx, Social History reviewed and updated as below.  Past Medical History  Diagnosis Date  . Thyroid cancer ([place]) 2007    s/p thyroidectomy  . Frequent headaches   . Hypothyroidism     s/p thyroidectomy - levothyroxine 75 mcg daily  . Allergy   . GERD (gastroesophageal reflux disease)     well controlled. Takes gingerroot when symptoms  . Migraine     takes excedrin. She has tried imitrex in the past   . IBS (irritable bowel syndrome)     constipation - takes miralax daily   Past Surgical History  Procedure Laterality Date  . Back surgery  1997    scoliosis  . Hernia repair  1990    bilateral inguinal  . Thyroidectomy  2007    thyroid cancer  . Total abdominal hysterectomy  2008    dymenorrhea   Family History  Problem Relation Age of Onset  . Hyperlipidemia Father   . Hypertension Father   . Clotting disorder Sister   . Hepatitis B Brother   . Cancer Paternal Grandmother     breast cancer? with mets  . Cancer Mother     Cervical Cancer  . Cancer Maternal Aunt     breast cancer  . Cancer Maternal Aunt     throat cancer  . Cancer Maternal Aunt     breast w/mets   Social History  Substance Use Topics  . Smoking status: Never Smoker   . Smokeless tobacco: Never Used  . Alcohol Use: Yes     Comment: Occasionally - very rarely   Review of Systems  HENT: Positive for voice change.     Gastrointestinal: Positive for abdominal pain, constipation and blood in stool.  Endocrine: Positive for polydipsia.  Genitourinary: Positive for frequency.  All other systems reviewed and are negative.  Objective:   Today's Vitals: BP 112/78 mmHg  Pulse 63  Temp(Src) 98.1 F (36.7 C) (Oral)  Ht 5' 1.25" (1.556 m)  Wt 130 lb 6 oz (59.138 kg)  BMI 24.43 kg/m2  SpO2 99%  Physical Exam  Constitutional: She is oriented to person, place, and time. She appears well-developed and well-nourished. No distress.  HENT:  Head: Normocephalic and atraumatic.  Nose: Nose normal.  Mouth/Throat: Oropharynx is clear and moist. No oropharyngeal exudate.  Normal TM's bilaterally.   Eyes: Conjunctivae are normal. No scleral icterus.  Neck: Neck supple. No thyromegaly present.  Cardiovascular: Normal rate and regular rhythm.   No murmur heard. Pulmonary/Chest: Effort normal and breath sounds normal. She has no wheezes. She has no rales.  Abdominal: Soft. She exhibits no distension. There is no tenderness. There is no rebound and no guarding.  Musculoskeletal: Normal range of motion. She exhibits no edema.  Lymphadenopathy:    She has no cervical adenopathy.  Neurological: She is alert and oriented to person, place, and time.  Skin: Skin is warm and dry. No  rash noted.  Psychiatric: She has a normal mood and affect.  Vitals reviewed.  Assessment & Plan:   Problem List Items Addressed This Visit    Hypothyroidism   Relevant Medications   levothyroxine (SYNTHROID, LEVOTHROID) 100 MCG tablet   Other Relevant Orders   TSH   Annual physical exam - Primary    Tdap today. Labs today - see orders. Pap smear not longer needed. No indication for screening mammogram.       Other Visit Diagnoses    Muscle spasm        Relevant Orders    Comp Met (CMET)    Screening, anemia, deficiency, iron        Relevant Orders    CBC    Hyperlipidemia        Relevant Orders    Lipid panel     Screening for HIV (human immunodeficiency virus)        Relevant Orders    HIV antibody (with reflex)    Need for prophylactic vaccination with combined diphtheria-tetanus-pertussis (DTP) vaccine        Relevant Orders    Tdap vaccine greater than or equal to 7yo IM (Completed)      Outpatient Encounter Prescriptions as of 04/01/2016  Medication Sig  . B Complex Vitamins (VITAMIN B COMPLEX) TABS Take 1 tablet by mouth daily.  . Cholecalciferol (VITAMIN D) 2000 UNITS tablet Take 2,000 Units by mouth daily.  . COD LIVER OIL PO Take by mouth.  . cyclobenzaprine (FLEXERIL) 5 MG tablet Take 1 tablet (5 mg total) by mouth at bedtime.  Marland Kitchen levothyroxine (SYNTHROID, LEVOTHROID) 100 MCG tablet Take 1 tablet (100 mcg total) by mouth daily before breakfast.  . loratadine (CLARITIN) 10 MG tablet Take 10 mg by mouth daily.  . polyethylene glycol (MIRALAX / GLYCOLAX) packet Take 17 g by mouth daily.  . SUMAtriptan (IMITREX) 50 MG tablet May repeat in 2 hours if headache persists or recurs.  . vitamin C (ASCORBIC ACID) 500 MG tablet Take 500 mg by mouth daily.  . [DISCONTINUED] cyclobenzaprine (FLEXERIL) 5 MG tablet Take 1 tablet (5 mg total) by mouth at bedtime.  . [DISCONTINUED] cyclobenzaprine (FLEXERIL) 5 MG tablet Take 1 tablet (5 mg total) by mouth at bedtime.  . [DISCONTINUED] levothyroxine (SYNTHROID, LEVOTHROID) 100 MCG tablet Take 1 tablet (100 mcg total) by mouth daily before breakfast.  . [DISCONTINUED] methylPREDNISolone (MEDROL) 4 MG tablet Take 6 tablets by mouth with breakfast or lunch and decrease by 1 tablet each day until gone.  . [DISCONTINUED] SUMAtriptan (IMITREX) 50 MG tablet May repeat in 2 hours if headache persists or recurs.  . [DISCONTINUED] APRI 0.15-30 MG-MCG tablet Take 1 tablet by mouth daily.   . [DISCONTINUED] ciprofloxacin (CIPRO) 250 MG tablet Take 1 tablet (250 mg total) by mouth 2 (two) times daily.  . [DISCONTINUED] fexofenadine (ALLEGRA) 30 MG tablet Take 30 mg by  mouth at bedtime as needed.   No facility-administered encounter medications on file as of 04/01/2016.    Follow-up: Annually  [place]

## 2016-04-01 NOTE — Patient Instructions (Signed)
Meds refilled.  Follow up annually.  Take care  Dr. Lacinda Axon   Health Maintenance, Female Adopting a healthy lifestyle and getting preventive care can go a long way to promote health and wellness. Talk with your health care provider about what schedule of regular examinations is right for you. This is a good chance for you to check in with your provider about disease prevention and staying healthy. In between checkups, there are plenty of things you can do on your own. Experts have done a lot of research about which lifestyle changes and preventive measures are most likely to keep you healthy. Ask your health care provider for more information. WEIGHT AND DIET  Eat a healthy diet  Be sure to include plenty of vegetables, fruits, low-fat dairy products, and lean protein.  Do not eat a lot of foods high in solid fats, added sugars, or salt.  Get regular exercise. This is one of the most important things you can do for your health.  Most adults should exercise for at least 150 minutes each week. The exercise should increase your heart rate and make you sweat (moderate-intensity exercise).  Most adults should also do strengthening exercises at least twice a week. This is in addition to the moderate-intensity exercise.  Maintain a healthy weight  Body mass index (BMI) is a measurement that can be used to identify possible weight problems. It estimates body fat based on height and weight. Your health care provider can help determine your BMI and help you achieve or maintain a healthy weight.  For females 27 years of age and older:   A BMI below 18.5 is considered underweight.  A BMI of 18.5 to 24.9 is normal.  A BMI of 25 to 29.9 is considered overweight.  A BMI of 30 and above is considered obese.  Watch levels of cholesterol and blood lipids  You should start having your blood tested for lipids and cholesterol at 37 years of age, then have this test every 5 years.  You may need to  have your cholesterol levels checked more often if:  Your lipid or cholesterol levels are high.  You are older than 37 years of age.  You are at high risk for heart disease.  CANCER SCREENING   Lung Cancer  Lung cancer screening is recommended for adults 4-68 years old who are at high risk for lung cancer because of a history of smoking.  A yearly low-dose CT scan of the lungs is recommended for people who:  Currently smoke.  Have quit within the past 15 years.  Have at least a 30-pack-year history of smoking. A pack year is smoking an average of one pack of cigarettes a day for 1 year.  Yearly screening should continue until it has been 15 years since you quit.  Yearly screening should stop if you develop a health problem that would prevent you from having lung cancer treatment.  Breast Cancer  Practice breast self-awareness. This means understanding how your breasts normally appear and feel.  It also means doing regular breast self-exams. Let your health care provider know about any changes, no matter how small.  If you are in your 20s or 30s, you should have a clinical breast exam (CBE) by a health care provider every 1-3 years as part of a regular health exam.  If you are 6 or older, have a CBE every year. Also consider having a breast X-ray (mammogram) every year.  If you have a family history of  history of breast cancer, talk to your health care provider about genetic screening.  If you are at high risk for breast cancer, talk to your health care provider about having an MRI and a mammogram every year.  Breast cancer gene (BRCA) assessment is recommended for women who have family members with BRCA-related cancers. BRCA-related cancers include:  Breast.  Ovarian.  Tubal.  Peritoneal cancers.  Results of the assessment will determine the need for genetic counseling and BRCA1 and BRCA2 testing. Cervical Cancer Your health care provider may recommend that you be  screened regularly for cancer of the pelvic organs (ovaries, uterus, and vagina). This screening involves a pelvic examination, including checking for microscopic changes to the surface of your cervix (Pap test). You may be encouraged to have this screening done every 3 years, beginning at age 21.  For women ages 30-65, health care providers may recommend pelvic exams and Pap testing every 3 years, or they may recommend the Pap and pelvic exam, combined with testing for human papilloma virus (HPV), every 5 years. Some types of HPV increase your risk of cervical cancer. Testing for HPV may also be done on women of any age with unclear Pap test results.  Other health care providers may not recommend any screening for nonpregnant women who are considered low risk for pelvic cancer and who do not have symptoms. Ask your health care provider if a screening pelvic exam is right for you.  If you have had past treatment for cervical cancer or a condition that could lead to cancer, you need Pap tests and screening for cancer for at least 20 years after your treatment. If Pap tests have been discontinued, your risk factors (such as having a new sexual partner) need to be reassessed to determine if screening should resume. Some women have medical problems that increase the chance of getting cervical cancer. In these cases, your health care provider may recommend more frequent screening and Pap tests. Colorectal Cancer  This type of cancer can be detected and often prevented.  Routine colorectal cancer screening usually begins at 37 years of age and continues through 37 years of age.  Your health care provider may recommend screening at an earlier age if you have risk factors for colon cancer.  Your health care provider may also recommend using home test kits to check for hidden blood in the stool.  A small camera at the end of a tube can be used to examine your colon directly (sigmoidoscopy or colonoscopy).  This is done to check for the earliest forms of colorectal cancer.  Routine screening usually begins at age 50.  Direct examination of the colon should be repeated every 5-10 years through 37 years of age. However, you may need to be screened more often if early forms of precancerous polyps or small growths are found. Skin Cancer  Check your skin from head to toe regularly.  Tell your health care provider about any new moles or changes in moles, especially if there is a change in a mole's shape or color.  Also tell your health care provider if you have a mole that is larger than the size of a pencil eraser.  Always use sunscreen. Apply sunscreen liberally and repeatedly throughout the day.  Protect yourself by wearing long sleeves, pants, a wide-brimmed hat, and sunglasses whenever you are outside. HEART DISEASE, DIABETES, AND HIGH BLOOD PRESSURE   High blood pressure causes heart disease and increases the risk of stroke. High blood pressure   is more likely to develop in:  People who have blood pressure in the high end of the normal range (130-139/85-89 mm Hg).  People who are overweight or obese.  People who are African American.  If you are 18-39 years of age, have your blood pressure checked every 3-5 years. If you are 40 years of age or older, have your blood pressure checked every year. You should have your blood pressure measured twice--once when you are at a hospital or clinic, and once when you are not at a hospital or clinic. Record the average of the two measurements. To check your blood pressure when you are not at a hospital or clinic, you can use:  An automated blood pressure machine at a pharmacy.  A home blood pressure monitor.  If you are between 55 years and 79 years old, ask your health care provider if you should take aspirin to prevent strokes.  Have regular diabetes screenings. This involves taking a blood sample to check your fasting blood sugar level.  If you  are at a normal weight and have a low risk for diabetes, have this test once every three years after 37 years of age.  If you are overweight and have a high risk for diabetes, consider being tested at a younger age or more often. PREVENTING INFECTION  Hepatitis B  If you have a higher risk for hepatitis B, you should be screened for this virus. You are considered at high risk for hepatitis B if:  You were born in a country where hepatitis B is common. Ask your health care provider which countries are considered high risk.  Your parents were born in a high-risk country, and you have not been immunized against hepatitis B (hepatitis B vaccine).  You have HIV or AIDS.  You use needles to inject street drugs.  You live with someone who has hepatitis B.  You have had sex with someone who has hepatitis B.  You get hemodialysis treatment.  You take certain medicines for conditions, including cancer, organ transplantation, and autoimmune conditions. Hepatitis C  Blood testing is recommended for:  Everyone born from 1945 through 1965.  Anyone with known risk factors for hepatitis C. Sexually transmitted infections (STIs)  You should be screened for sexually transmitted infections (STIs) including gonorrhea and chlamydia if:  You are sexually active and are younger than 37 years of age.  You are older than 37 years of age and your health care provider tells you that you are at risk for this type of infection.  Your sexual activity has changed since you were last screened and you are at an increased risk for chlamydia or gonorrhea. Ask your health care provider if you are at risk.  If you do not have HIV, but are at risk, it may be recommended that you take a prescription medicine daily to prevent HIV infection. This is called pre-exposure prophylaxis (PrEP). You are considered at risk if:  You are sexually active and do not regularly use condoms or know the HIV status of your  partner(s).  You take drugs by injection.  You are sexually active with a partner who has HIV. Talk with your health care provider about whether you are at high risk of being infected with HIV. If you choose to begin PrEP, you should first be tested for HIV. You should then be tested every 3 months for as long as you are taking PrEP.  PREGNANCY   If you are premenopausal and   become pregnant, ask your health care provider about preconception counseling.  If you may become pregnant, take 400 to 800 micrograms (mcg) of folic acid every day.  If you want to prevent pregnancy, talk to your health care provider about birth control (contraception). OSTEOPOROSIS AND MENOPAUSE   Osteoporosis is a disease in which the bones lose minerals and strength with aging. This can result in serious bone fractures. Your risk for osteoporosis can be identified using a bone density scan.  If you are 46 years of age or older, or if you are at risk for osteoporosis and fractures, ask your health care provider if you should be screened.  Ask your health care provider whether you should take a calcium or vitamin D supplement to lower your risk for osteoporosis.  Menopause may have certain physical symptoms and risks.  Hormone replacement therapy may reduce some of these symptoms and risks. Talk to your health care provider about whether hormone replacement therapy is right for you.  HOME CARE INSTRUCTIONS   Schedule regular health, dental, and eye exams.  Stay current with your immunizations.   Do not use any tobacco products including cigarettes, chewing tobacco, or electronic cigarettes.  If you are pregnant, do not drink alcohol.  If you are breastfeeding, limit how much and how often you drink alcohol.  Limit alcohol intake to no more than 1 drink per day for nonpregnant women. One drink equals 12 ounces of beer, 5 ounces of wine, or 1 ounces of hard liquor.  Do not use street drugs.  Do  not share needles.  Ask your health care provider for help if you need support or information about quitting drugs.  Tell your health care provider if you often feel depressed.  Tell your health care provider if you have ever been abused or do not feel safe at home.   This information is not intended to replace advice given to you by your health care provider. Make sure you discuss any questions you have with your health care provider.   Document Released: 03/21/2011 Document Revised: 09/26/2014 Document Reviewed: 08/07/2013 Elsevier Interactive Patient Education [employer].

## 2016-04-01 NOTE — Assessment & Plan Note (Signed)
Tdap today. Labs today - see orders. Pap smear not longer needed. No indication for screening mammogram.

## 2016-04-02 LAB — HIV ANTIBODY (ROUTINE TESTING W REFLEX): HIV 1&2 Ab, 4th Generation: NONREACTIVE

## 2016-05-04 ENCOUNTER — Other Ambulatory Visit: Payer: Self-pay | Admitting: Family Medicine

## 2016-05-04 MED ORDER — LEVOTHYROXINE SODIUM 75 MCG PO TABS: 75.0000 ug | ORAL_TABLET | Freq: Every day | ORAL | 3 refills | Status: DC

## 2016-05-13 ENCOUNTER — Telehealth: Payer: Self-pay

## 2016-05-13 DIAGNOSIS — R7989 Other specified abnormal findings of blood chemistry: Secondary | ICD-10-CM

## 2016-05-13 NOTE — Telephone Encounter (Signed)
Pt coming for repeat TSH. TSH ordered.

## 2016-05-16 ENCOUNTER — Other Ambulatory Visit (INDEPENDENT_AMBULATORY_CARE_PROVIDER_SITE_OTHER): Payer: BLUE CROSS/BLUE SHIELD

## 2016-05-16 DIAGNOSIS — R7989 Other specified abnormal findings of blood chemistry: Secondary | ICD-10-CM

## 2016-05-16 DIAGNOSIS — R946 Abnormal results of thyroid function studies: Secondary | ICD-10-CM

## 2016-05-16 LAB — TSH: TSH: 1.43 u[IU]/mL (ref 0.35–4.50)

## 2016-09-27 ENCOUNTER — Encounter: Payer: Self-pay | Admitting: Family Medicine

## 2016-09-28 ENCOUNTER — Other Ambulatory Visit: Payer: Self-pay | Admitting: Family Medicine

## 2016-09-28 DIAGNOSIS — E039 Hypothyroidism, unspecified: Secondary | ICD-10-CM

## 2016-09-30 ENCOUNTER — Other Ambulatory Visit (INDEPENDENT_AMBULATORY_CARE_PROVIDER_SITE_OTHER): Payer: BLUE CROSS/BLUE SHIELD

## 2016-09-30 DIAGNOSIS — E039 Hypothyroidism, unspecified: Secondary | ICD-10-CM | POA: Diagnosis not present

## 2016-09-30 LAB — TSH: TSH: 2.29 u[IU]/mL (ref 0.35–4.50)

## 2016-12-14 NOTE — Progress Notes (Signed)
HPI:      [name] is a 36 y.o. No obstetric history on file. who LMP was No LMP recorded. Patient has had a hysterectomy., presents today for her annual examination.  Her menses are absent due to vag hyst due to chronic pelvic pain.  She does not have intermenstrual bleeding. She was referred to urology 2016 by Dr. Leonides Schanz due to persistent pelvic pain sx. Pt's sx improved if has regular BMs. Has a hx of IBS with constipation. She has urinary incont sx but doesn't want to take meds. She is doing kegels with a little relief. She drinks a large coffee in the AM.  Sex activity: single partner, contraception - status post hysterectomy.  Last Pap: September 04, 2013  Results were: no abnormalities /neg HPV DNA  Hx of STDs: none  Last mammogram: April 15, 2014  Results were: normal--routine follow-up in 12 months There is a FH of breast cancer in 3 pat aunts and PGM. Genetic testing hasn't been done but pt qualifies. There is a FH of ovarian cancer in her mom. The patient does do self-breast exams.  Tobacco use: The patient denies current or previous tobacco use. Alcohol use: none Exercise: moderately active  She does get adequate calcium and Vitamin D in her diet.    Past Medical History:  Diagnosis Date  . Allergy   . Frequent headaches   . GERD (gastroesophageal reflux disease)    well controlled. Takes gingerroot when symptoms  . Hypothyroidism    s/p thyroidectomy - levothyroxine 75 mcg daily  . IBS (irritable bowel syndrome)    constipation - takes miralax daily  . Migraine    takes excedrin. She has tried imitrex in the past   . Thyroid cancer [facility]) 2007   s/p thyroidectomy    Past Surgical History:  Procedure Laterality Date  . BACK SURGERY  1997   scoliosis  . HERNIA REPAIR  1990   bilateral inguinal  . THYROIDECTOMY  2007   thyroid cancer  . TOTAL ABDOMINAL HYSTERECTOMY  2008   dymenorrhea    Family History  Problem Relation Age of Onset  .  Hyperlipidemia Father   . Hypertension Father   . Clotting disorder Sister   . Hepatitis B Brother   . Cancer Paternal Grandmother     breast cancer? with mets  . Cancer Mother     Cervical Cancer  . Cancer Maternal Aunt     throat cancer  . Breast cancer Paternal Aunt     59  . Breast cancer Paternal Aunt     52  . Breast cancer Paternal Aunt     81     ROS:  Review of Systems  Constitutional: Negative for fever, malaise/fatigue and weight loss.  HENT: Negative for congestion, ear pain and sinus pain.   Respiratory: Negative for cough, shortness of breath and wheezing.   Cardiovascular: Negative for chest pain, orthopnea and leg swelling.  Gastrointestinal: Negative for constipation, diarrhea, nausea and vomiting.  Genitourinary: Positive for frequency. Negative for dysuria, hematuria and urgency.       Breast ROS: negative   Musculoskeletal: Negative for back pain, joint pain and myalgias.  Skin: Negative for itching and rash.  Neurological: Negative for dizziness, tingling, focal weakness and headaches.  Endo/Heme/Allergies: Negative for environmental allergies. Does not bruise/bleed easily.  Psychiatric/Behavioral: Negative for depression and suicidal ideas. The patient is not nervous/anxious and does not have insomnia.     Objective: BP 110/70  Pulse 73   Ht _0  (1.549 m)   Wt 134 lb (60.8 kg)   BMI 25.32 kg/m    Physical Exam  Constitutional: She is oriented to person, place, and time. She appears well-developed and well-nourished.  Genitourinary: Vagina normal. No erythema or tenderness in the vagina. No vaginal discharge found. Right adnexum does not display mass and does not display tenderness. Left adnexum does not display mass and does not display tenderness.  Neck: Normal range of motion. No thyromegaly present.  Cardiovascular: Normal rate, regular rhythm and normal heart sounds.   No murmur heard. Pulmonary/Chest: Effort normal and breath sounds  normal. Right breast exhibits no mass, no nipple discharge, no skin change and no tenderness. Left breast exhibits no mass, no nipple discharge, no skin change and no tenderness.  Abdominal: Soft. There is no tenderness. There is no guarding.  Musculoskeletal: Normal range of motion.  Neurological: She is alert and oriented to person, place, and time. No cranial nerve deficit.  Psychiatric: She has a normal mood and affect. Her behavior is normal.  Vitals reviewed.   Assessment/Plan: Encounter for annual routine gynecological examination  Cervical cancer screening - Plan: IGP, Aptima HPV  Screening for HPV (human papillomavirus) - Plan: IGP, Aptima HPV  Family history of breast cancer - My Risk testing discussed and done today. RTO in 6 wks for results. Pt to sched scr mammo. - Plan: MM SCREENING BREAST TOMO BILATERAL, [facility] ([place])  Screening for breast cancer              GYN counsel breast self exam, mammography screening, adequate intake of calcium and vitamin D, Kegel's exercises     F/U  Return in about 6 weeks (around 01/26/2017) for My Risk results f/u.  Sarah Moran B. Sarah Bernath, PA-C 12/15/2016 9:17 AM

## 2016-12-15 ENCOUNTER — Ambulatory Visit (INDEPENDENT_AMBULATORY_CARE_PROVIDER_SITE_OTHER): Payer: BLUE CROSS/BLUE SHIELD | Admitting: Obstetrics and Gynecology

## 2016-12-15 ENCOUNTER — Telehealth: Payer: Self-pay

## 2016-12-15 ENCOUNTER — Encounter: Payer: Self-pay | Admitting: Obstetrics and Gynecology

## 2016-12-15 VITALS — BP 110/70 | HR 73 | Ht 61.0 in | Wt 134.0 lb

## 2016-12-15 DIAGNOSIS — Z1231 Encounter for screening mammogram for malignant neoplasm of breast: Secondary | ICD-10-CM

## 2016-12-15 DIAGNOSIS — Z1239 Encounter for other screening for malignant neoplasm of breast: Secondary | ICD-10-CM

## 2016-12-15 DIAGNOSIS — Z8049 Family history of malignant neoplasm of other genital organs: Secondary | ICD-10-CM | POA: Diagnosis not present

## 2016-12-15 DIAGNOSIS — Z01419 Encounter for gynecological examination (general) (routine) without abnormal findings: Secondary | ICD-10-CM

## 2016-12-15 DIAGNOSIS — Z124 Encounter for screening for malignant neoplasm of cervix: Secondary | ICD-10-CM | POA: Diagnosis not present

## 2016-12-15 DIAGNOSIS — Z85850 Personal history of malignant neoplasm of thyroid: Secondary | ICD-10-CM | POA: Diagnosis not present

## 2016-12-15 DIAGNOSIS — Z1151 Encounter for screening for human papillomavirus (HPV): Secondary | ICD-10-CM

## 2016-12-15 DIAGNOSIS — R875 Abnormal microbiological findings in specimens from female genital organs: Secondary | ICD-10-CM | POA: Diagnosis not present

## 2016-12-15 DIAGNOSIS — Z803 Family history of malignant neoplasm of breast: Secondary | ICD-10-CM | POA: Diagnosis not present

## 2016-12-15 NOTE — Telephone Encounter (Signed)
Will amend form. RN to f/u with pt.

## 2016-12-15 NOTE — Telephone Encounter (Signed)
Pt called.  She was seen earlier this am.  She put on her form that her Grandmother had Breast Cancer at age 38.  It was age 27.  Please call [phone]

## 2016-12-19 LAB — IGP, APTIMA HPV
HPV Aptima: NEGATIVE
PAP Smear Comment: 0

## 2016-12-26 ENCOUNTER — Encounter: Payer: Self-pay | Admitting: Obstetrics and Gynecology

## 2016-12-28 ENCOUNTER — Encounter: Payer: Self-pay | Admitting: Obstetrics and Gynecology

## 2017-01-09 ENCOUNTER — Encounter: Payer: Self-pay | Admitting: Obstetrics and Gynecology

## 2017-01-17 DIAGNOSIS — Z9189 Other specified personal risk factors, not elsewhere classified: Secondary | ICD-10-CM

## 2017-01-17 DIAGNOSIS — Z1379 Encounter for other screening for genetic and chromosomal anomalies: Secondary | ICD-10-CM

## 2017-01-17 HISTORY — DX: Other specified personal risk factors, not elsewhere classified: Z91.89

## 2017-01-17 HISTORY — DX: Encounter for other screening for genetic and chromosomal anomalies: Z13.79

## 2017-01-19 ENCOUNTER — Ambulatory Visit
Admission: RE | Admit: 2017-01-19 | Discharge: 2017-01-19 | Disposition: A | Discharge status: Home / Self Care | Payer: BLUE CROSS/BLUE SHIELD | Source: Ambulatory Visit | Attending: Obstetrics and Gynecology | Admitting: Obstetrics and Gynecology

## 2017-01-19 DIAGNOSIS — Z1231 Encounter for screening mammogram for malignant neoplasm of breast: Secondary | ICD-10-CM | POA: Insufficient documentation

## 2017-01-19 DIAGNOSIS — Z803 Family history of malignant neoplasm of breast: Secondary | ICD-10-CM | POA: Diagnosis not present

## 2017-01-19 IMAGING — MG 2D DIGITAL SCREENING BILATERAL MAMMOGRAM WITH CAD AND ADJUNCT TO
9 of 12 series · 9 of 28 positions shown · non-contrast
Comparison: Previous exam(s).

CLINICAL DATA: Screening.

EXAM:
2D DIGITAL SCREENING BILATERAL MAMMOGRAM WITH CAD AND ADJUNCT TOMO

[L MLO synth-2D]
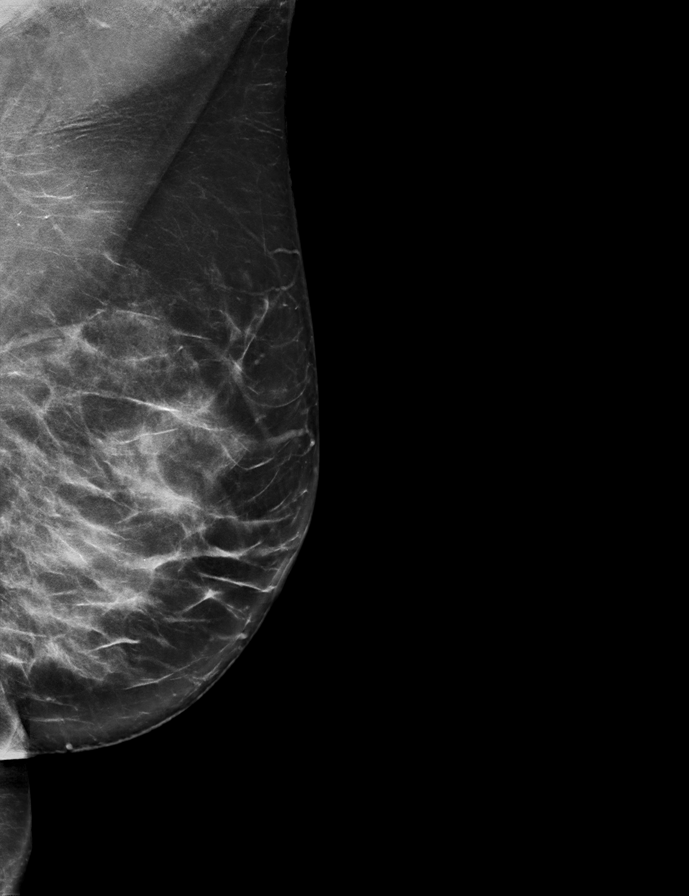

[R MLO synth-2D]
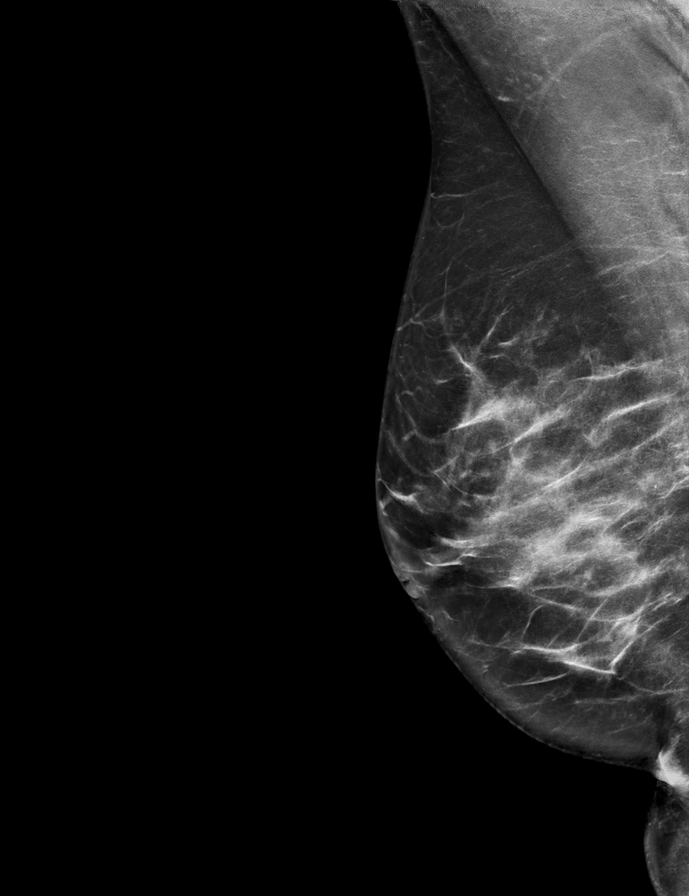

[R MLO]
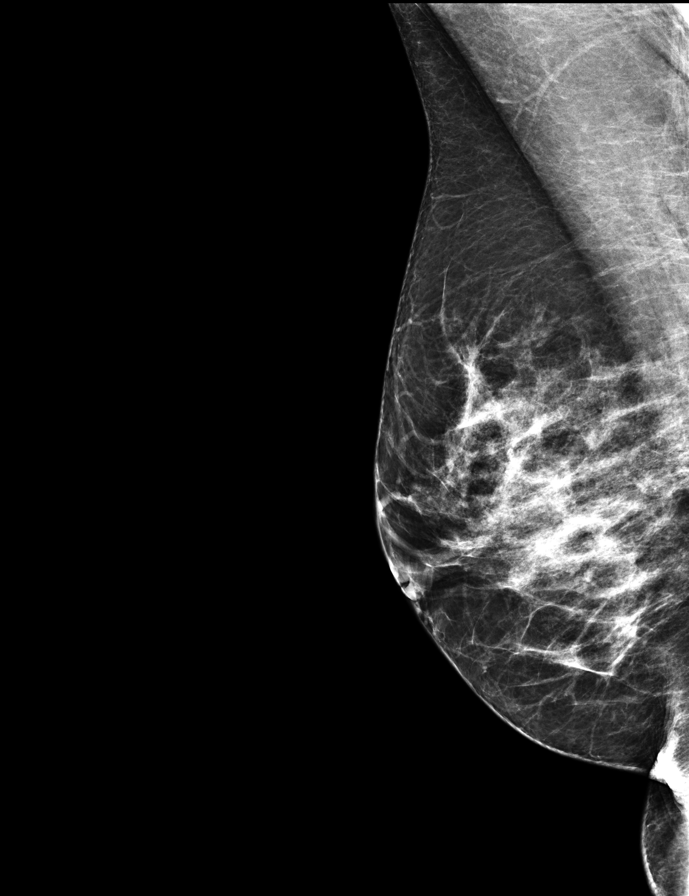

[L CC synth-2D]
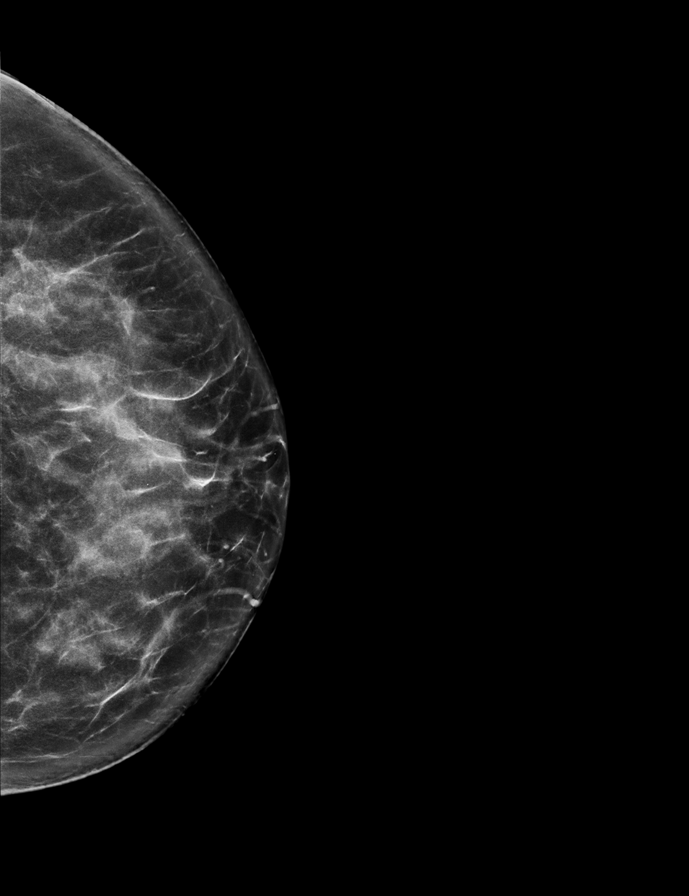

[L MLO]
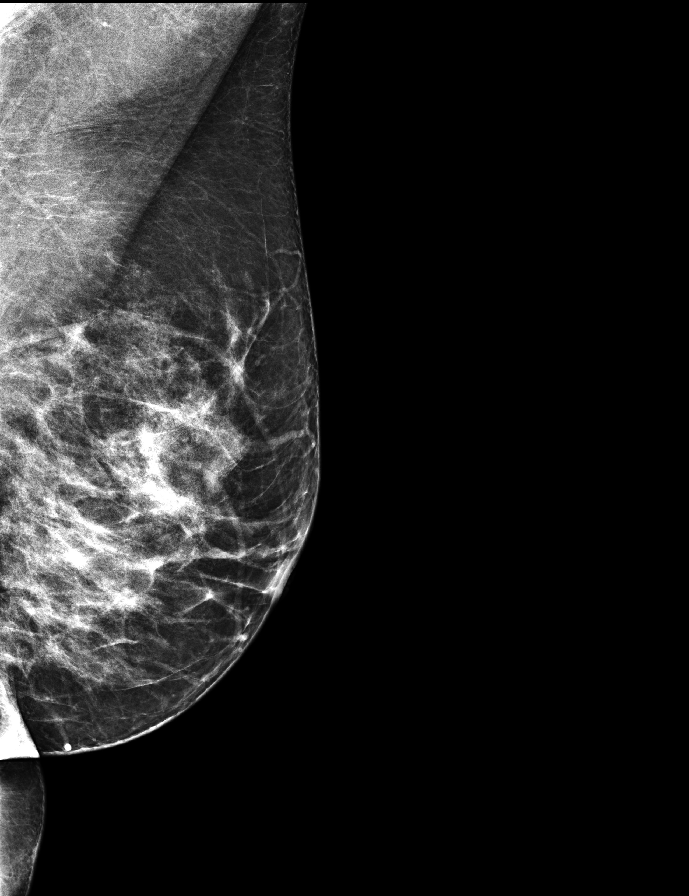

[L CC]
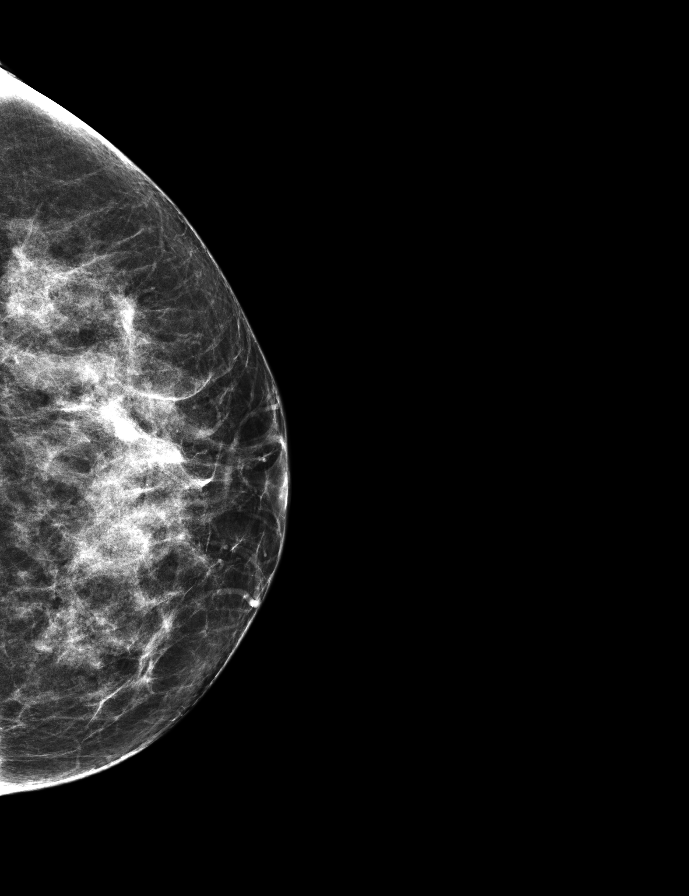

[R CC]
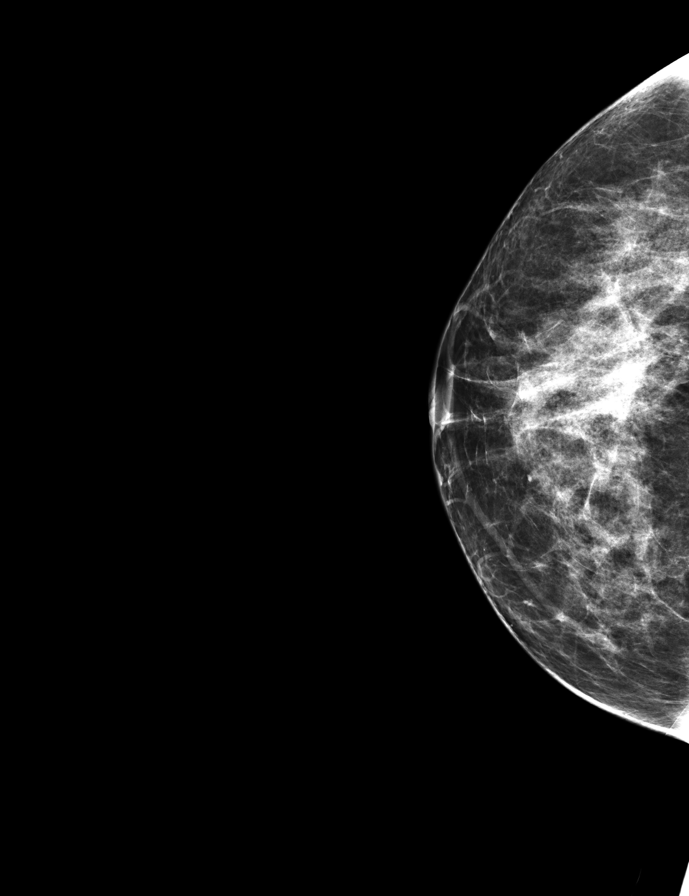

[R CC synth-2D]
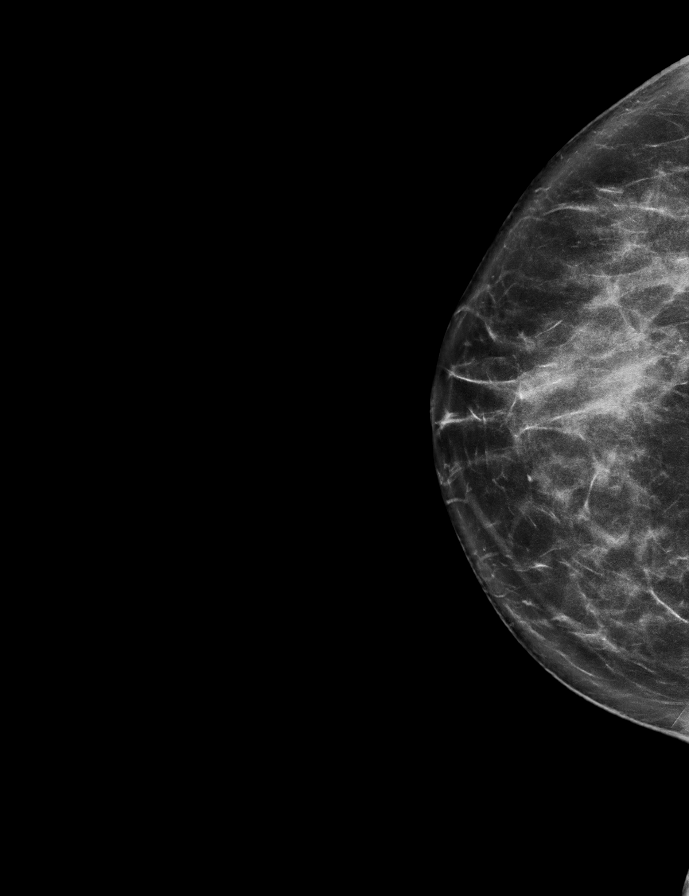

[R CC tomo · tomo slice 35/70.0]
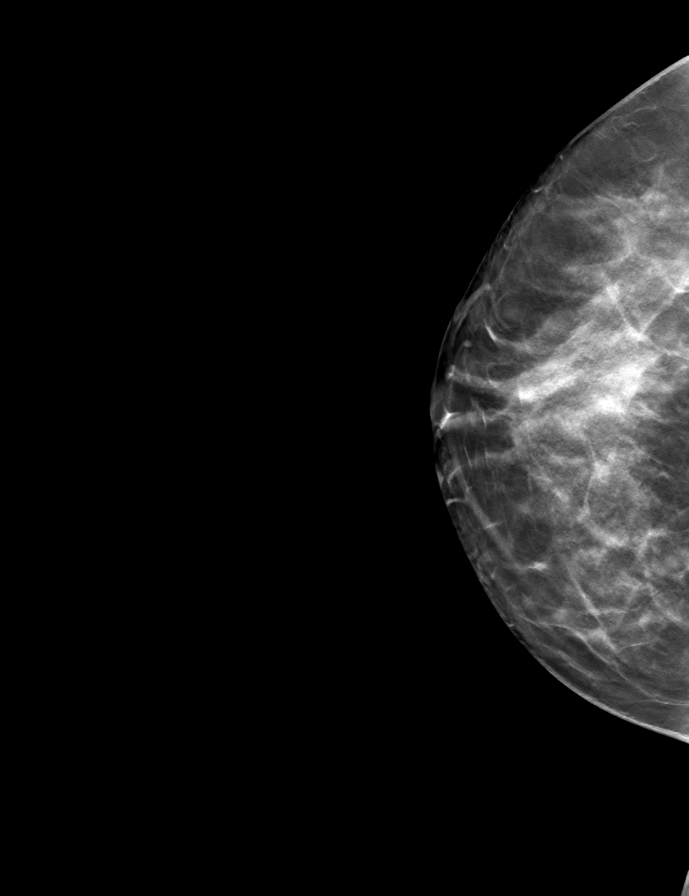

[9 of 28 positions shown; findings below may reference images not displayed]

ACR Breast Density Category b: There are scattered areas of
fibroglandular density.
FINDINGS: There are no findings suspicious for malignancy. Images were
processed with CAD.
IMPRESSION: No mammographic evidence of malignancy. A result letter of this
screening mammogram will be mailed directly to the patient.

RECOMMENDATION:
Screening mammogram at age 40. (Code:[6W])

BI-RADS CATEGORY  1: Negative.

## 2017-01-20 ENCOUNTER — Encounter: Payer: Self-pay | Admitting: Obstetrics and Gynecology

## 2017-01-26 ENCOUNTER — Encounter: Payer: Self-pay | Admitting: Obstetrics and Gynecology

## 2017-01-26 ENCOUNTER — Ambulatory Visit (INDEPENDENT_AMBULATORY_CARE_PROVIDER_SITE_OTHER): Payer: BLUE CROSS/BLUE SHIELD | Admitting: Obstetrics and Gynecology

## 2017-01-26 VITALS — BP 100/60 | HR 86 | Ht 61.0 in | Wt 132.0 lb

## 2017-01-26 DIAGNOSIS — Z7183 Encounter for nonprocreative genetic counseling: Secondary | ICD-10-CM

## 2017-01-26 DIAGNOSIS — Z9189 Other specified personal risk factors, not elsewhere classified: Secondary | ICD-10-CM

## 2017-01-26 DIAGNOSIS — Z803 Family history of malignant neoplasm of breast: Secondary | ICD-10-CM

## 2017-01-26 NOTE — Progress Notes (Signed)
   Chief Complaint  Patient presents with  . Results  My Risk cancer genetic testing results.  HPI:   [name] is a 36 y.o. [id number] who LMP was No LMP recorded. Patient has had a hysterectomy., presents today for  My Risk cancer genetic testing results.  Results are negative for a cancer genetic mutation.  IBIS=18.8 % Riskscore=21.9% Last mammogram 01/20/17 [facility] 1 Vitamin D status: takes 2000 IU supp daily  Patient Active Problem List   Diagnosis Date Noted  . Increased risk of breast cancer 01/09/2017  . Family history of breast cancer 12/15/2016  . Annual physical exam 04/01/2016  . History of thyroid cancer 06/27/2014  . Hypothyroidism 06/27/2014  . Migraine 09/23/2013    Family History  Problem Relation Age of Onset  . Hyperlipidemia Father   . Hypertension Father   . Clotting disorder Sister   . Hepatitis B Brother   . Cancer Paternal Grandmother 23       breast cancer? with mets  . Cancer Mother        Cervical Cancer  . Cancer Maternal Aunt        throat cancer  . Breast cancer Paternal Aunt        26  . Breast cancer Paternal Aunt        27  . Breast cancer Paternal Aunt        23     Assessment/Plan: Encounter for nonprocreative genetic counseling - Pt is My Risk neg; riskscore=21.9%.   Family history of breast cancer  Increased risk of breast cancer   Recommended monthly SBE, yearly CBE, yearly mammos and add Vit D3 2000 IU dialy. Also offered yearly screening breast MRI, staggered with mammos. Pt did mammo 5/18 and f/u by 10/18 if wants MRI.   Patient understands these results only apply to her and her children, and this is not indicative of genetic testing results of her other family members. It is recommended that her other family members have genetic testing done.  Pt also understands negative genetic testing doesn't mean she will never get any of these cancers.   Results handout given to pt.  26  Min spent in direct contact with pt  re: counseling/coordination of care.   RTO when annual due.  Brenner Visconti B. Hardin Hardenbrook, PA-C  01/26/2017 9:28 AM

## 2017-04-12 ENCOUNTER — Other Ambulatory Visit: Payer: Self-pay | Admitting: Family Medicine

## 2017-04-19 ENCOUNTER — Other Ambulatory Visit: Payer: Self-pay | Admitting: Family Medicine

## 2017-04-20 ENCOUNTER — Telehealth: Payer: Self-pay

## 2017-04-20 NOTE — Telephone Encounter (Addendum)
Patient prefers the  [employer] opposed to [employer] of levothyroxine need permission to change manufacturer and new rx

## 2017-04-21 NOTE — Telephone Encounter (Signed)
Fine by me 

## 2017-04-21 NOTE — Telephone Encounter (Signed)
Advised [facility] ok to change manufactures .

## 2017-05-01 ENCOUNTER — Telehealth: Payer: Self-pay | Admitting: Family Medicine

## 2017-05-01 NOTE — Telephone Encounter (Signed)
Pt called wanting to get her lab work done before appt.   Call pt @ [phone]. Thank you!

## 2017-05-02 ENCOUNTER — Other Ambulatory Visit: Payer: Self-pay | Admitting: Family Medicine

## 2017-05-02 DIAGNOSIS — E039 Hypothyroidism, unspecified: Secondary | ICD-10-CM

## 2017-05-02 DIAGNOSIS — E785 Hyperlipidemia, unspecified: Secondary | ICD-10-CM

## 2017-05-02 DIAGNOSIS — Z130 Encounter for screening for diseases of the blood and blood-forming organs and certain disorders involving the immune mechanism: Secondary | ICD-10-CM

## 2017-05-02 NOTE — Telephone Encounter (Signed)
Left voice mail to call back 

## 2017-05-02 NOTE — Telephone Encounter (Signed)
Patient returned your call . She will come in Thursday for lab work.

## 2017-05-02 NOTE — Telephone Encounter (Signed)
Orders in 

## 2017-05-04 ENCOUNTER — Other Ambulatory Visit (INDEPENDENT_AMBULATORY_CARE_PROVIDER_SITE_OTHER): Payer: BLUE CROSS/BLUE SHIELD

## 2017-05-04 DIAGNOSIS — E039 Hypothyroidism, unspecified: Secondary | ICD-10-CM | POA: Diagnosis not present

## 2017-05-04 DIAGNOSIS — Z130 Encounter for screening for diseases of the blood and blood-forming organs and certain disorders involving the immune mechanism: Secondary | ICD-10-CM

## 2017-05-04 DIAGNOSIS — E785 Hyperlipidemia, unspecified: Secondary | ICD-10-CM

## 2017-05-04 LAB — CBC
HCT: 38.8 % (ref 36.0–46.0)
Hemoglobin: 13 g/dL (ref 12.0–15.0)
MCHC: 33.5 g/dL (ref 30.0–36.0)
MCV: 91 fl (ref 78.0–100.0)
Platelets: 292 10*3/uL (ref 150.0–400.0)
RBC: 4.27 Mil/uL (ref 3.87–5.11)
RDW: 12.7 % (ref 11.5–15.5)
WBC: 7.3 10*3/uL (ref 4.0–10.5)

## 2017-05-04 LAB — LIPID PANEL
Cholesterol: 155 mg/dL (ref 0–200)
HDL: 39.3 mg/dL (ref >39.00)
LDL Cholesterol: 99 mg/dL (ref 0–99)
NonHDL: 115.36
Total CHOL/HDL Ratio: 4
Triglycerides: 82 mg/dL (ref 0.0–149.0)
VLDL: 16.4 mg/dL (ref 0.0–40.0)

## 2017-05-04 LAB — COMPREHENSIVE METABOLIC PANEL
ALT: 8 U/L (ref 0–35)
AST: 14 U/L (ref 0–37)
Albumin: 3.9 g/dL (ref 3.5–5.2)
Alkaline Phosphatase: 49 U/L (ref 39–117)
BUN: 15 mg/dL (ref 6–23)
CO2: 27 mEq/L (ref 19–32)
Calcium: 8.7 mg/dL (ref 8.4–10.5)
Chloride: 105 mEq/L (ref 96–112)
Creatinine, Ser: 0.69 mg/dL (ref 0.40–1.20)
GFR: 101.17 mL/min (ref >60.00)
Glucose, Bld: 97 mg/dL (ref 70–99)
Potassium: 4.1 mEq/L (ref 3.5–5.1)
Sodium: 137 mEq/L (ref 135–145)
Total Bilirubin: 0.4 mg/dL (ref 0.2–1.2)
Total Protein: 6.4 g/dL (ref 6.0–8.3)

## 2017-05-04 LAB — TSH: TSH: 1.82 u[IU]/mL (ref 0.35–4.50)

## 2017-05-05 ENCOUNTER — Encounter: Payer: BLUE CROSS/BLUE SHIELD | Admitting: Family Medicine

## 2017-05-08 ENCOUNTER — Ambulatory Visit (INDEPENDENT_AMBULATORY_CARE_PROVIDER_SITE_OTHER): Payer: BLUE CROSS/BLUE SHIELD | Admitting: Family Medicine

## 2017-05-08 DIAGNOSIS — Z0001 Encounter for general adult medical examination with abnormal findings: Secondary | ICD-10-CM | POA: Diagnosis not present

## 2017-05-08 DIAGNOSIS — R202 Paresthesia of skin: Secondary | ICD-10-CM | POA: Diagnosis not present

## 2017-05-08 DIAGNOSIS — Z0000 Encounter for general adult medical examination without abnormal findings: Secondary | ICD-10-CM

## 2017-05-08 MED ORDER — TIZANIDINE HCL 4 MG PO TABS: 4.0000 mg | ORAL_TABLET | Freq: Four times a day (QID) | ORAL | 0 refills | Status: DC | PRN

## 2017-05-08 NOTE — Patient Instructions (Signed)
Continue your medications.  Follow up annually.  Take care  Dr. Lacinda Axon   Health Maintenance, Female Adopting a healthy lifestyle and getting preventive care can go a long way to promote health and wellness. Talk with your health care provider about what schedule of regular examinations is right for you. This is a good chance for you to check in with your provider about disease prevention and staying healthy. In between checkups, there are plenty of things you can do on your own. Experts have done a lot of research about which lifestyle changes and preventive measures are most likely to keep you healthy. Ask your health care provider for more information. Weight and diet Eat a healthy diet  Be sure to include plenty of vegetables, fruits, low-fat dairy products, and lean protein.  Do not eat a lot of foods high in solid fats, added sugars, or salt.  Get regular exercise. This is one of the most important things you can do for your health. ? Most adults should exercise for at least 150 minutes each week. The exercise should increase your heart rate and make you sweat (moderate-intensity exercise). ? Most adults should also do strengthening exercises at least twice a week. This is in addition to the moderate-intensity exercise.  Maintain a healthy weight  Body mass index (BMI) is a measurement that can be used to identify possible weight problems. It estimates body fat based on height and weight. Your health care provider can help determine your BMI and help you achieve or maintain a healthy weight.  For females 33 years of age and older: ? A BMI below 18.5 is considered underweight. ? A BMI of 18.5 to 24.9 is normal. ? A BMI of 25 to 29.9 is considered overweight. ? A BMI of 30 and above is considered obese.  Watch levels of cholesterol and blood lipids  You should start having your blood tested for lipids and cholesterol at 38 years of age, then have this test every 5 years.  You may  need to have your cholesterol levels checked more often if: ? Your lipid or cholesterol levels are high. ? You are older than 38 years of age. ? You are at high risk for heart disease.  Cancer screening Lung Cancer  Lung cancer screening is recommended for adults 61-67 years old who are at high risk for lung cancer because of a history of smoking.  A yearly low-dose CT scan of the lungs is recommended for people who: ? Currently smoke. ? Have quit within the past 15 years. ? Have at least a 30-pack-year history of smoking. A pack year is smoking an average of one pack of cigarettes a day for 1 year.  Yearly screening should continue until it has been 15 years since you quit.  Yearly screening should stop if you develop a health problem that would prevent you from having lung cancer treatment.  Breast Cancer  Practice breast self-awareness. This means understanding how your breasts normally appear and feel.  It also means doing regular breast self-exams. Let your health care provider know about any changes, no matter how small.  If you are in your 20s or 30s, you should have a clinical breast exam (CBE) by a health care provider every 1-3 years as part of a regular health exam.  If you are 41 or older, have a CBE every year. Also consider having a breast X-ray (mammogram) every year.  If you have a family history of breast cancer, talk  to your health care provider about genetic screening.  If you are at high risk for breast cancer, talk to your health care provider about having an MRI and a mammogram every year.  Breast cancer gene (BRCA) assessment is recommended for women who have family members with BRCA-related cancers. BRCA-related cancers include: ? Breast. ? Ovarian. ? Tubal. ? Peritoneal cancers.  Results of the assessment will determine the need for genetic counseling and BRCA1 and BRCA2 testing.  Cervical Cancer Your health care provider may recommend that you be  screened regularly for cancer of the pelvic organs (ovaries, uterus, and vagina). This screening involves a pelvic examination, including checking for microscopic changes to the surface of your cervix (Pap test). You may be encouraged to have this screening done every 3 years, beginning at age 42.  For women ages 66-65, health care providers may recommend pelvic exams and Pap testing every 3 years, or they may recommend the Pap and pelvic exam, combined with testing for human papilloma virus (HPV), every 5 years. Some types of HPV increase your risk of cervical cancer. Testing for HPV may also be done on women of any age with unclear Pap test results.  Other health care providers may not recommend any screening for nonpregnant women who are considered low risk for pelvic cancer and who do not have symptoms. Ask your health care provider if a screening pelvic exam is right for you.  If you have had past treatment for cervical cancer or a condition that could lead to cancer, you need Pap tests and screening for cancer for at least 20 years after your treatment. If Pap tests have been discontinued, your risk factors (such as having a new sexual partner) need to be reassessed to determine if screening should resume. Some women have medical problems that increase the chance of getting cervical cancer. In these cases, your health care provider may recommend more frequent screening and Pap tests.  Colorectal Cancer  This type of cancer can be detected and often prevented.  Routine colorectal cancer screening usually begins at 38 years of age and continues through 38 years of age.  Your health care provider may recommend screening at an earlier age if you have risk factors for colon cancer.  Your health care provider may also recommend using home test kits to check for hidden blood in the stool.  A small camera at the end of a tube can be used to examine your colon directly (sigmoidoscopy or colonoscopy).  This is done to check for the earliest forms of colorectal cancer.  Routine screening usually begins at age 44.  Direct examination of the colon should be repeated every 5-10 years through 38 years of age. However, you may need to be screened more often if early forms of precancerous polyps or small growths are found.  Skin Cancer  Check your skin from head to toe regularly.  Tell your health care provider about any new moles or changes in moles, especially if there is a change in a mole's shape or color.  Also tell your health care provider if you have a mole that is larger than the size of a pencil eraser.  Always use sunscreen. Apply sunscreen liberally and repeatedly throughout the day.  Protect yourself by wearing long sleeves, pants, a wide-brimmed hat, and sunglasses whenever you are outside.  Heart disease, diabetes, and high blood pressure  High blood pressure causes heart disease and increases the risk of stroke. High blood pressure is more  likely to develop in: ? People who have blood pressure in the high end of the normal range (130-139/85-89 mm Hg). ? People who are overweight or obese. ? People who are African American.  If you are 36-78 years of age, have your blood pressure checked every 3-5 years. If you are 46 years of age or older, have your blood pressure checked every year. You should have your blood pressure measured twice-once when you are at a hospital or clinic, and once when you are not at a hospital or clinic. Record the average of the two measurements. To check your blood pressure when you are not at a hospital or clinic, you can use: ? An automated blood pressure machine at a pharmacy. ? A home blood pressure monitor.  If you are between 20 years and 57 years old, ask your health care provider if you should take aspirin to prevent strokes.  Have regular diabetes screenings. This involves taking a blood sample to check your fasting blood sugar level. ? If  you are at a normal weight and have a low risk for diabetes, have this test once every three years after 38 years of age. ? If you are overweight and have a high risk for diabetes, consider being tested at a younger age or more often. Preventing infection Hepatitis B  If you have a higher risk for hepatitis B, you should be screened for this virus. You are considered at high risk for hepatitis B if: ? You were born in a country where hepatitis B is common. Ask your health care provider which countries are considered high risk. ? Your parents were born in a high-risk country, and you have not been immunized against hepatitis B (hepatitis B vaccine). ? You have HIV or AIDS. ? You use needles to inject street drugs. ? You live with someone who has hepatitis B. ? You have had sex with someone who has hepatitis B. ? You get hemodialysis treatment. ? You take certain medicines for conditions, including cancer, organ transplantation, and autoimmune conditions.  Hepatitis C  Blood testing is recommended for: ? Everyone born from [birth year] through 1967. ? Anyone with known risk factors for hepatitis C.  Sexually transmitted infections (STIs)  You should be screened for sexually transmitted infections (STIs) including gonorrhea and chlamydia if: ? You are sexually active and are younger than 38 years of age. ? You are older than 38 years of age and your health care provider tells you that you are at risk for this type of infection. ? Your sexual activity has changed since you were last screened and you are at an increased risk for chlamydia or gonorrhea. Ask your health care provider if you are at risk.  If you do not have HIV, but are at risk, it may be recommended that you take a prescription medicine daily to prevent HIV infection. This is called pre-exposure prophylaxis (PrEP). You are considered at risk if: ? You are sexually active and do not regularly use condoms or know the HIV status of your  partner(s). ? You take drugs by injection. ? You are sexually active with a partner who has HIV.  Talk with your health care provider about whether you are at high risk of being infected with HIV. If you choose to begin PrEP, you should first be tested for HIV. You should then be tested every 3 months for as long as you are taking PrEP. Pregnancy  If you are premenopausal and you may  become pregnant, ask your health care provider about preconception counseling.  If you may become pregnant, take 400 to 800 micrograms (mcg) of folic acid every day.  If you want to prevent pregnancy, talk to your health care provider about birth control (contraception). Osteoporosis and menopause  Osteoporosis is a disease in which the bones lose minerals and strength with aging. This can result in serious bone fractures. Your risk for osteoporosis can be identified using a bone density scan.  If you are 40 years of age or older, or if you are at risk for osteoporosis and fractures, ask your health care provider if you should be screened.  Ask your health care provider whether you should take a calcium or vitamin D supplement to lower your risk for osteoporosis.  Menopause may have certain physical symptoms and risks.  Hormone replacement therapy may reduce some of these symptoms and risks. Talk to your health care provider about whether hormone replacement therapy is right for you. Follow these instructions at home:  Schedule regular health, dental, and eye exams.  Stay current with your immunizations.  Do not use any tobacco products including cigarettes, chewing tobacco, or electronic cigarettes.  If you are pregnant, do not drink alcohol.  If you are breastfeeding, limit how much and how often you drink alcohol.  Limit alcohol intake to no more than 1 drink per day for nonpregnant women. One drink equals 12 ounces of beer, 5 ounces of wine, or 1 ounces of hard liquor.  Do not use street  drugs.  Do not share needles.  Ask your health care provider for help if you need support or information about quitting drugs.  Tell your health care provider if you often feel depressed.  Tell your health care provider if you have ever been abused or do not feel safe at home. This information is not intended to replace advice given to you by your health care provider. Make sure you discuss any questions you have with your health care provider. Document Released: 03/21/2011 Document Revised: 02/11/2016 Document Reviewed: 06/09/2015 Elsevier Interactive Patient Education  [employer].

## 2017-05-08 NOTE — Progress Notes (Signed)
Subjective:  Patient ID: Sarah Moran, female    DOB: April 03, 1981  Age: 36 y.o. MRN: [id number]  CC: Annual physical  HPI Sarah Moran is a 36 y.o. female presents to the clinic today for an annual physical exam.  Preventative Healthcare  Pap smear: Up to date.  Immunizations: Up to date.  Labs: Recent normal Labs.  Alcohol use: See below.  Smoking/tobacco use: Nonsmoker.  PMH, Surgical Hx, Family Hx, Social History reviewed and updated as below.  Past Medical History:  Diagnosis Date  . Allergy   . Chronic pelvic pain in female   . Family history of breast cancer    My Risk neg 5/18  . Frequent headaches   . Genetic testing of female 01/2017   My Risk/BRCA neg  . GERD (gastroesophageal reflux disease)    well controlled. Takes gingerroot when symptoms  . Hypothyroidism    s/p thyroidectomy - levothyroxine 75 mcg daily  . IBS (irritable bowel syndrome)    constipation - takes miralax daily  . Increased risk of breast cancer 01/2017   IBIS=18.8%/riskscore=21.9%  . Migraine    takes excedrin. She has tried imitrex in the past   . Thyroid cancer [facility]) 2007   s/p thyroidectomy   Past Surgical History:  Procedure Laterality Date  . BACK SURGERY  1997   scoliosis  . COLONOSCOPY    . HERNIA REPAIR  1990   bilateral inguinal  . THYROIDECTOMY  2007   thyroid cancer  . TOTAL ABDOMINAL HYSTERECTOMY  2008   dymenorrhea   Family History  Problem Relation Age of Onset  . Hyperlipidemia Father   . Hypertension Father   . Clotting disorder Sister   . Hepatitis B Brother   . Cancer Paternal Grandmother 26       breast cancer? with mets  . Cancer Mother        Cervical Cancer  . Cancer Maternal Aunt        throat cancer  . Breast cancer Paternal Aunt        29  . Breast cancer Paternal Aunt        66  . Breast cancer Paternal Aunt        41   Social History  Substance Use Topics  . Smoking status: Never Smoker  . Smokeless tobacco: Never Used  .  Alcohol use Yes     Comment: Occasionally - very rarely    Review of Systems  Neurological:       Tingling - arms, fingers, legs.  All other systems reviewed and are negative.   Objective:   Today's Vitals: BP 108/78 (BP Location: Left Arm, Patient Position: Sitting, Cuff Size: Normal)   Pulse 80   Temp 98.5 F (36.9 C) (Oral)   Resp 16   Wt 133 lb 2 oz (60.4 kg)   SpO2 99%   BMI 25.15 kg/m   Physical Exam  Constitutional: She is oriented to person, place, and time. She appears well-developed and well-nourished. No distress.  HENT:  Head: Normocephalic and atraumatic.  Nose: Nose normal.  Mouth/Throat: Oropharynx is clear and moist. No oropharyngeal exudate.  Normal TM's bilaterally.   Eyes: Conjunctivae are normal. No scleral icterus.  Neck: Neck supple.  Cardiovascular: Normal rate and regular rhythm.   No murmur heard. Pulmonary/Chest: Effort normal and breath sounds normal. She has no wheezes. She has no rales.  Abdominal: Soft. She exhibits no distension. There is no tenderness. There is no rebound and no guarding.  Musculoskeletal: Normal range of motion. She exhibits no edema.  Lymphadenopathy:    She has no cervical adenopathy.  Neurological: She is alert and oriented to person, place, and time.  Skin: Skin is warm and dry. No rash noted.  Psychiatric: She has a normal mood and affect.  Vitals reviewed.  Assessment & Plan:   Problem List Items Addressed This Visit    Annual physical exam    Doing well. Labs normal. Preventative health up-to-date. Patient reporting diffuse paresthesias. Slightly troublesome. We'll continue to monitor. Not pursuing further workup at this time.         Meds ordered this encounter  Medications  . Biotin (BIOTIN 5000) 5 MG CAPS    Sig: Take by mouth.  Marland Kitchen tiZANidine (ZANAFLEX) 4 MG tablet    Sig: Take 1 tablet (4 mg total) by mouth every 6 (six) hours as needed for muscle spasms.    Dispense:  30 tablet    Refill:  0      Follow-up: Return in about 6 months (around 11/08/2017).  [place]

## 2017-05-08 NOTE — Assessment & Plan Note (Signed)
Doing well. Labs normal. Preventative health up-to-date. Patient reporting diffuse paresthesias. Slightly troublesome. We'll continue to monitor. Not pursuing further workup at this time.

## 2017-11-09 ENCOUNTER — Other Ambulatory Visit: Payer: Self-pay

## 2017-11-09 ENCOUNTER — Ambulatory Visit: Payer: BLUE CROSS/BLUE SHIELD | Admitting: Family Medicine

## 2017-11-09 ENCOUNTER — Encounter: Payer: Self-pay | Admitting: Family Medicine

## 2017-11-09 ENCOUNTER — Telehealth: Payer: Self-pay | Admitting: Family Medicine

## 2017-11-09 VITALS — BP 104/64 | HR 75 | Temp 98.4°F | Wt 136.6 lb

## 2017-11-09 DIAGNOSIS — Z85850 Personal history of malignant neoplasm of thyroid: Secondary | ICD-10-CM | POA: Diagnosis not present

## 2017-11-09 DIAGNOSIS — E039 Hypothyroidism, unspecified: Secondary | ICD-10-CM | POA: Diagnosis not present

## 2017-11-09 DIAGNOSIS — G43909 Migraine, unspecified, not intractable, without status migrainosus: Secondary | ICD-10-CM | POA: Diagnosis not present

## 2017-11-09 LAB — TSH: TSH: 2.24 u[IU]/mL (ref 0.35–4.50)

## 2017-11-09 NOTE — Telephone Encounter (Signed)
-----   Message from Carlean Jews, [facility] sent at 11/09/2017  4:48 PM EST ----- Oneita Kras... I suppose possible with different excipients included in the formulation?   I called her pharmacy, the Lannett version of levothyroxine is on national backorder and no release date listed on their website. I'd advise her to find out what manufacturer's tablets she is taking now (she can call her pharmacy and they can look it up) and ask for a different manufacturer's tablets to be ordered. They should be able to do that pretty easily. Here are the different companies that make generic levothyroxine: Mylan, [employer]ann[employer], [employer]oz, [place] this helps!  Chrys Racer  ----- Message ----- From: Leone Haven, MD Sent: 11/09/2017   1:53 PM To: Carlean Jews, [place],   This patient asked about switching brands of synthroid noting that her current brand caused an increase in her migraines while the brand "linnett" did not result in any migraines. Is this something that you have heard of before? Also, do you know a way to find out what pharmacies carry the brand listed above as the patient wants to switch? Thanks for your help.   Randall Hiss

## 2017-11-09 NOTE — Patient Instructions (Signed)
Nice to meet you. We will check a TSH today. We will try to switch her thyroid medication. We will get you referred to an endocrinologist.

## 2017-11-09 NOTE — Assessment & Plan Note (Signed)
Check TSH.  Continue Synthroid.  Will check into changing the brand.

## 2017-11-09 NOTE — Assessment & Plan Note (Signed)
No apparent notes available.  We will request records from the cancer center.  I think it is reasonable to have her follow with endocrinology given her history of thyroid cancer.

## 2017-11-09 NOTE — Telephone Encounter (Signed)
Please let the patient know that I heard from our clinical pharmacist.  Please advise her that the lannett version of levothyroxine is on national back order and there is no release date listed on their website.  Chrys Racer suggested that she find out what manufactures tablet she is currently taking now and ask for a different manufacturer's tablet from her pharmacy.  We can then see if there is a difference.  Thanks.

## 2017-11-09 NOTE — Progress Notes (Signed)
  Tommi Rumps, MD Phone: [phone]  Sarah Moran is a 37 y.o. female who presents today for f/u.  HYPOTHYROIDISM Disease Monitoring Weight changes: no  Skin Changes: no Heat/Cold intolerance: no  Medication Monitoring Compliance:  Taking synthroid   Last TSH:   Lab Results  Component Value Date   TSH 1.82 05/04/2017  Wants to change brands of synthroid to linnett as she had fewer migraines.   Migraines: history of this. Worse with certain thyroid medications.  Had fewer on brand name listed above.  Describes as whole head hurting with nausea.  Takes Imitrex and that is beneficial.  Notes photophobia and phonophobia.  Oftentimes around her cycle.  No numbness or weakness.  Does have a history of thyroid cancer.  She was released by oncology though wonders if she should follow-up with a thyroid specialist.  No recurrence of lesions in her neck.   Social History   Tobacco Use  Smoking Status Never Smoker  Smokeless Tobacco Never Used     ROS see history of present illness  Objective  Physical Exam Vitals:   11/09/17 0934  BP: 104/64  Pulse: 75  Temp: 98.4 F (36.9 C)  SpO2: 99%    BP Readings from Last 3 Encounters:  11/09/17 104/64  05/08/17 108/78  01/26/17 100/60   Wt Readings from Last 3 Encounters:  11/09/17 136 lb 9.6 oz (62 kg)  05/08/17 133 lb 2 oz (60.4 kg)  01/26/17 132 lb (59.9 kg)    Physical Exam  Constitutional: No distress.  Neck: Neck supple.  No palpable abnormalities in anterior neck  Cardiovascular: Normal rate, regular rhythm and normal heart sounds.  Pulmonary/Chest: Effort normal and breath sounds normal.  Musculoskeletal: She exhibits no edema.  Neurological: She is alert. Gait normal.  Skin: Skin is warm and dry. She is not diaphoretic.     Assessment/Plan: Please see individual problem list.  Migraine Seems to have worsened with a certain brand of Synthroid.  We will see if we can change that brand of Synthroid.   Otherwise she will continue to monitor for now.  Hypothyroidism Check TSH.  Continue Synthroid.  Will check into changing the brand.  History of thyroid cancer No apparent notes available.  We will request records from the cancer center.  I think it is reasonable to have her follow with endocrinology given her history of thyroid cancer.   Orders Placed This Encounter  Procedures  . TSH  . Ambulatory referral to Endocrinology    Referral Priority:   Routine    Referral Type:   Consultation    Referral Reason:   Specialty Services Required    Number of Visits Requested:   1    No orders of the defined types were placed in this encounter.    Tommi Rumps, MD [place]

## 2017-11-09 NOTE — Assessment & Plan Note (Signed)
Seems to have worsened with a certain brand of Synthroid.  We will see if we can change that brand of Synthroid.  Otherwise she will continue to monitor for now.

## 2017-11-10 MED ORDER — SYNTHROID 75 MCG PO TABS: 75.0000 ug | ORAL_TABLET | Freq: Every day | ORAL | 1 refills | Status: DC

## 2017-11-10 NOTE — Telephone Encounter (Signed)
Patient notified and she states she has tried all of them and they have caused her migraines. She states ,aybe the synthroid would work better

## 2017-11-10 NOTE — Telephone Encounter (Signed)
Patient notified

## 2017-11-10 NOTE — Addendum Note (Signed)
Addended by: Caryl Bis Nalayah Hitt G on: 11/10/2017 01:40 PM   Modules accepted: Orders

## 2017-11-10 NOTE — Telephone Encounter (Signed)
Noted. We can try brand name synthroid and see if that is different. I sent this to her pharmacy. Thanks.

## 2017-12-11 ENCOUNTER — Ambulatory Visit: Payer: BLUE CROSS/BLUE SHIELD | Admitting: Endocrinology

## 2017-12-11 ENCOUNTER — Encounter: Payer: Self-pay | Admitting: Endocrinology

## 2017-12-11 VITALS — BP 98/64 | HR 96 | Wt 134.6 lb

## 2017-12-11 DIAGNOSIS — Z85850 Personal history of malignant neoplasm of thyroid: Secondary | ICD-10-CM | POA: Diagnosis not present

## 2017-12-11 LAB — TSH: TSH: 2.89 u[IU]/mL (ref 0.35–4.50)

## 2017-12-11 LAB — T4, FREE: Free T4: 0.88 ng/dL (ref 0.60–1.60)

## 2017-12-11 NOTE — Patient Instructions (Addendum)
blood tests are requested for you today.  We'll let you know about the results.   If no cancer is found, please come back for a follow-up appointment in 1 year.

## 2017-12-11 NOTE — Progress Notes (Signed)
Subjective:    Patient ID: Sarah Moran, female    DOB: [birth year], 37 y.o.   MRN: [id number]  HPI Pt is ref by Dr Caryl Bis, for thyroid cancer.  She had right thyroid lobectomy for thyroid cancer in 2007.  She had completion left lobectomy in early 2008.  She then had RAI, 100 mCi.  She has been on synthroid since then.  She has been followed up, but no recurrence has been found. She does not notice any lump at the ant neck.  She has slight fatigue, and assoc difficulty losing weight.  Past Medical History:  Diagnosis Date  . Allergy   . Chronic pelvic pain in female   . Family history of breast cancer    My Risk neg 5/18  . Frequent headaches   . Genetic testing of female 01/2017   My Risk/BRCA neg  . GERD (gastroesophageal reflux disease)    well controlled. Takes gingerroot when symptoms  . Hypothyroidism    s/p thyroidectomy - levothyroxine 75 mcg daily  . IBS (irritable bowel syndrome)    constipation - takes miralax daily  . Increased risk of breast cancer 01/2017   IBIS=18.8%/riskscore=21.9%  . Migraine    takes excedrin. She has tried imitrex in the past   . Thyroid cancer [facility]) 2007   s/p thyroidectomy    Past Surgical History:  Procedure Laterality Date  . BACK SURGERY  1997   scoliosis  . COLONOSCOPY    . HERNIA REPAIR  1990   bilateral inguinal  . THYROIDECTOMY  2007   thyroid cancer  . TOTAL ABDOMINAL HYSTERECTOMY  2008   dymenorrhea    Social History   Socioeconomic History  . Marital status: Married    Spouse name: Audelia Acton  . Number of children: 2  . Years of education: 63  . Highest education level: Not on file  Occupational History  . Occupation: Stay at [employer]  . Financial resource strain: Not on file  . Food insecurity:    Worry: Not on file    Inability: Not on file  . Transportation needs:    Medical: Not on file    Non-medical: Not on file  Tobacco Use  . Smoking status: Never Smoker  . Smokeless tobacco: Never  Used  Substance and Sexual Activity  . Alcohol use: Yes    Comment: Occasionally - very rarely  . Drug use: No  . Sexual activity: Yes    Birth control/protection: Surgical  Lifestyle  . Physical activity:    Days per week: Not on file    Minutes per session: Not on file  . Stress: Not on file  Relationships  . Social connections:    Talks on phone: Not on file    Gets together: Not on file    Attends religious service: Not on file    Active member of club or organization: Not on file    Attends meetings of clubs or organizations: Not on file    Relationship status: Not on file  . Intimate partner violence:    Fear of current or ex partner: Not on file    Emotionally abused: Not on file    Physically abused: Not on file    Forced sexual activity: Not on file  Other Topics Concern  . Not on file  Social History Narrative   Truth grew up in [place]. She currently lives in [place] with her husband, Audelia Acton, and their 2 sons ([place]  age 77 and Jaci Standard age 55). 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 Carling belongs to the [employer]. She enjoys outdoor activities on her spare time.    Current Outpatient Medications on File Prior to Visit  Medication Sig Dispense Refill  . Cholecalciferol (VITAMIN D) 2000 UNITS tablet Take 2,000 Units by mouth daily.    . COD LIVER OIL PO Take by mouth.    . Multiple Vitamin (MULTIVITAMIN) capsule Take 1 capsule by mouth daily.    . SUMAtriptan (IMITREX) 50 MG tablet TAKE ONE TABLET BY MOUTH AT ONSET OF MIGRAINE -- MAY REPEAT DOSE IN 2 HOURS IF HEADACHE PERSISTS OR RECURS. 10 tablet 11  . SYNTHROID 75 MCG tablet Take 1 tablet (75 mcg total) by mouth daily before breakfast. 90 tablet 1  . vitamin C (ASCORBIC ACID) 500 MG tablet Take 500 mg by mouth daily.    Marland Kitchen levocetirizine (XYZAL) 5 MG tablet Take 5 mg by mouth every evening.    . polyethylene glycol (MIRALAX / GLYCOLAX) packet Take  17 g by mouth daily.    Marland Kitchen tiZANidine (ZANAFLEX) 4 MG tablet Take 1 tablet (4 mg total) by mouth every 6 (six) hours as needed for muscle spasms. (Patient not taking: Reported on 12/11/2017) 30 tablet 0   No current facility-administered medications on file prior to visit.     Allergies  Allergen Reactions  . Darvon [Propoxyphene] Other (See Comments)    Hallucinations   . Percocet [Oxycodone-Acetaminophen] Hives  . Vicodin [Hydrocodone-Acetaminophen] Hives    Family History  Problem Relation Age of Onset  . Hyperlipidemia Father   . Hypertension Father   . Clotting disorder Sister   . Hepatitis B Brother   . Cancer Paternal Grandmother 37       breast cancer? with mets  . Cancer Mother        Cervical Cancer  . Cancer Maternal Aunt        throat cancer  . Breast cancer Paternal Aunt        41  . Breast cancer Paternal Aunt        59  . Breast cancer Paternal Aunt        31    BP 98/64 (BP Location: Left Arm, Patient Position: Sitting, Cuff Size: Normal)   Pulse 96   Wt 134 lb 9.6 oz (61.1 kg)   SpO2 97%   BMI 25.43 kg/m    Review of Systems denies depression, hair loss, muscle cramps, sob, memory loss, numbness, blurry vision, cold intolerance, myalgias, rhinorrhea, and syncope.  She has anxiety, dry skin, constipation,and easy bruising.     Objective:   Physical Exam VS: see vs page GEN: no distress HEAD: head: no deformity eyes: no periorbital swelling, no proptosis external nose and ears are normal mouth: no lesion seen NECK: a healed scar is present.  I do not appreciate a nodule in the thyroid or elsewhere in the neck.  CHEST WALL: no deformity LUNGS: clear to auscultation CV: reg rate and rhythm, no murmur ABD: abdomen is soft, nontender.  no hepatosplenomegaly.  not distended.  no hernia MUSCULOSKELETAL: muscle bulk and strength are grossly normal.  no obvious joint swelling.  gait is normal and steady EXTEMITIES: no deformity.  no edema PULSES: no  carotid bruit NEURO:  cn 2-12 grossly intact.   readily moves all 4's.  sensation is intact to touch on all 4's SKIN:  Normal texture and temperature.  No rash or suspicious lesion is visible.   NODES:  None palpable at the neck PSYCH: alert, well-oriented.  Does not appear anxious nor depressed.  Pt signs release of information from previous care of thyroid cancer  Lab Results  Component Value Date   TSH 2.24 11/09/2017   T4TOTAL 12.7 (H) 01/22/2014      Assessment & Plan:  Differentiated thyroid cancer, new to me: due for recheck. Postsurgical hypothyroidism: given long likely disease-free interval, she does not need suppressive dosage of synthroid.    Patient Instructions  blood tests are requested for you today.  We'll let you know about the results.   If no cancer is found, please come back for a follow-up appointment in 1 year.

## 2017-12-12 LAB — THYROGLOBULIN ANTIBODY: Thyroglobulin Ab: <1 IU/mL (ref <=1)

## 2017-12-12 LAB — THYROGLOBULIN LEVEL: Thyroglobulin: <0.1 ng/mL — ABNORMAL LOW

## 2018-02-15 ENCOUNTER — Telehealth: Payer: BLUE CROSS/BLUE SHIELD | Admitting: Family

## 2018-02-15 DIAGNOSIS — J028 Acute pharyngitis due to other specified organisms: Secondary | ICD-10-CM

## 2018-02-15 DIAGNOSIS — B9689 Other specified bacterial agents as the cause of diseases classified elsewhere: Secondary | ICD-10-CM

## 2018-02-15 MED ORDER — BENZONATATE 100 MG PO CAPS: 100.0000 mg | ORAL_CAPSULE | Freq: Three times a day (TID) | ORAL | 0 refills | Status: DC | PRN

## 2018-02-15 MED ORDER — AZITHROMYCIN 250 MG PO TABS: ORAL_TABLET | ORAL | 0 refills | Status: DC

## 2018-02-15 MED ORDER — PREDNISONE 5 MG PO TABS: 5.0000 mg | ORAL_TABLET | ORAL | 0 refills | Status: DC

## 2018-02-15 NOTE — Progress Notes (Signed)

## 2018-02-22 DIAGNOSIS — D2262 Melanocytic nevi of left upper limb, including shoulder: Secondary | ICD-10-CM | POA: Diagnosis not present

## 2018-02-22 DIAGNOSIS — D2261 Melanocytic nevi of right upper limb, including shoulder: Secondary | ICD-10-CM | POA: Diagnosis not present

## 2018-02-22 DIAGNOSIS — D2271 Melanocytic nevi of right lower limb, including hip: Secondary | ICD-10-CM | POA: Diagnosis not present

## 2018-02-22 DIAGNOSIS — D225 Melanocytic nevi of trunk: Secondary | ICD-10-CM | POA: Diagnosis not present

## 2018-04-12 ENCOUNTER — Ambulatory Visit (INDEPENDENT_AMBULATORY_CARE_PROVIDER_SITE_OTHER): Payer: BLUE CROSS/BLUE SHIELD

## 2018-04-12 ENCOUNTER — Ambulatory Visit: Payer: Self-pay

## 2018-04-12 ENCOUNTER — Encounter: Payer: Self-pay | Admitting: Family

## 2018-04-12 ENCOUNTER — Ambulatory Visit: Payer: BLUE CROSS/BLUE SHIELD | Admitting: Family

## 2018-04-12 VITALS — BP 92/64 | HR 76 | Temp 98.2°F | Resp 14 | Ht 61.0 in | Wt 134.1 lb

## 2018-04-12 DIAGNOSIS — K5900 Constipation, unspecified: Secondary | ICD-10-CM | POA: Diagnosis not present

## 2018-04-12 DIAGNOSIS — R1013 Epigastric pain: Secondary | ICD-10-CM

## 2018-04-12 LAB — CBC WITH DIFFERENTIAL/PLATELET
Basophils Absolute: 0.1 10*3/uL (ref 0.0–0.1)
Basophils Relative: 0.6 % (ref 0.0–3.0)
Eosinophils Absolute: 0.1 10*3/uL (ref 0.0–0.7)
Eosinophils Relative: 1.1 % (ref 0.0–5.0)
HCT: 37.9 % (ref 36.0–46.0)
Hemoglobin: 12.9 g/dL (ref 12.0–15.0)
Lymphocytes Relative: 22.5 % (ref 12.0–46.0)
Lymphs Abs: 2 10*3/uL (ref 0.7–4.0)
MCHC: 34.1 g/dL (ref 30.0–36.0)
MCV: 89.8 fl (ref 78.0–100.0)
Monocytes Absolute: 0.4 10*3/uL (ref 0.1–1.0)
Monocytes Relative: 4.7 % (ref 3.0–12.0)
Neutro Abs: 6.3 10*3/uL (ref 1.4–7.7)
Neutrophils Relative %: 71.1 % (ref 43.0–77.0)
Platelets: 310 10*3/uL (ref 150.0–400.0)
RBC: 4.21 Mil/uL (ref 3.87–5.11)
RDW: 12.6 % (ref 11.5–15.5)
WBC: 8.9 10*3/uL (ref 4.0–10.5)

## 2018-04-12 LAB — COMPREHENSIVE METABOLIC PANEL
ALT: 11 U/L (ref 0–35)
AST: 17 U/L (ref 0–37)
Albumin: 4.2 g/dL (ref 3.5–5.2)
Alkaline Phosphatase: 53 U/L (ref 39–117)
BUN: 7 mg/dL (ref 6–23)
CO2: 31 mEq/L (ref 19–32)
Calcium: 9.7 mg/dL (ref 8.4–10.5)
Chloride: 99 mEq/L (ref 96–112)
Creatinine, Ser: 0.66 mg/dL (ref 0.40–1.20)
GFR: 105.97 mL/min (ref >60.00)
Glucose, Bld: 104 mg/dL — ABNORMAL HIGH (ref 70–99)
Potassium: 3.7 mEq/L (ref 3.5–5.1)
Sodium: 137 mEq/L (ref 135–145)
Total Bilirubin: 0.3 mg/dL (ref 0.2–1.2)
Total Protein: 7.1 g/dL (ref 6.0–8.3)

## 2018-04-12 LAB — HEMOGLOBIN A1C: Hgb A1c MFr Bld: 5.6 % (ref 4.6–6.5)

## 2018-04-12 IMAGING — DX DG ABDOMEN 1V
1 series · 1 of 1 positions shown · non-contrast
Comparison: [DATE]

CLINICAL DATA: Epigastric pain and constipation

EXAM:
ABDOMEN - 1 VIEW

[abdomen standing ap]
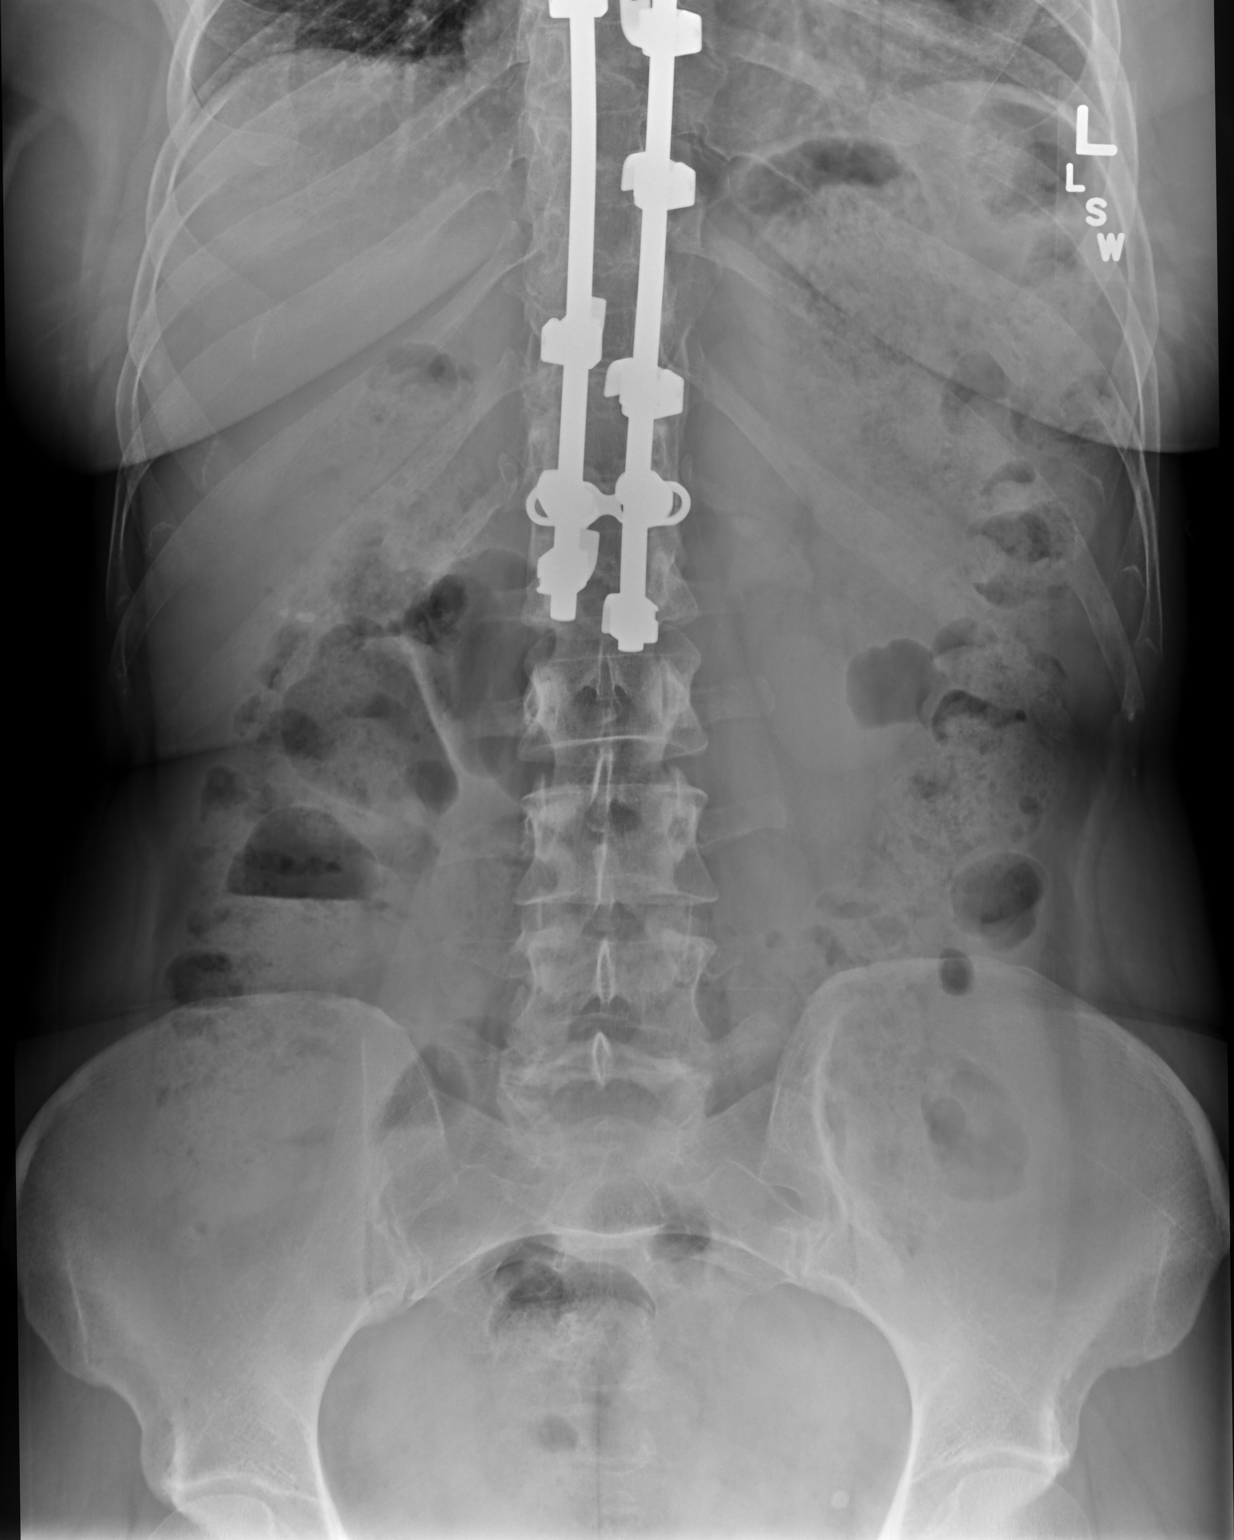

[1 of 1 positions shown; findings below may reference images not displayed]

FINDINGS: There is diffuse stool throughout most of the colon. There is no
appreciable bowel dilatation or air-fluid level to suggest bowel
obstruction. No free air. There is a phlebolith in the left pelvis.
There is postoperative change in the lower thoracic and upper lumbar
regions.
IMPRESSION: Diffuse stool throughout most of colon. Suspect a degree of
constipation. No bowel obstruction or free air evident.

## 2018-04-12 MED ORDER — OMEPRAZOLE 20 MG PO CPDR: 20.0000 mg | DELAYED_RELEASE_CAPSULE | Freq: Every day | ORAL | 3 refills | Status: DC

## 2018-04-12 NOTE — Patient Instructions (Addendum)
As discussed, the etiology of her epigastric pain is nonspecific at this time. Suspect reflux.    It is most important you stay hypervigilant.  Today we will do abdominal x-ray to look for an obstruction, constipation. We will call you about abdominal ultrasound which will look at the gallbladder in particular. Labs today.  Also sent a prescription for Prilosec and I want you to take this for the next 2 weeks consistently.  At that point please  follow-up with myself or your primary care provider to see if you should continue on this medication.  Today we discussed referrals, orders. ULTRASOUND, referral to GI   I have placed these orders in the system for you.  Please be sure to give [place] a call if you have not heard from our office regarding scheduling a test or regarding referral in a timely manner.  It is very important that you let me know as soon as possible.

## 2018-04-12 NOTE — Telephone Encounter (Signed)
Pt. Reports she has had abdominal pain and burning x 2-3 months. Seems to be getting worse - more burning than pain. Wakes up with it in the morning and sometimes it wakes her up at night. Does have a history of IBS - uses Miralax. Sometimes eating helps the discomfort - sometimes it makes it worse. Appointment made for today. Reason for Disposition . [1] MODERATE pain (e.g., interferes with normal activities) AND [2] pain comes and goes (cramps) AND [3] present > 24 hours  (Exception: pain with Vomiting or Diarrhea - see that Guideline)  Answer Assessment - Initial Assessment Questions 1. LOCATION: "Where does it hurt?"      Middle of stomach 2. RADIATION: "Does the pain shoot anywhere else?" (e.g., chest, back)     No 3. ONSET: "When did the pain begin?" (e.g., minutes, hours or days ago)      2-3 months 4. SUDDEN: "Gradual or sudden onset?"     Gradual 5. PATTERN "Does the pain come and go, or is it constant?"    - If constant: "Is it getting better, staying the same, or worsening?"      (Note: Constant means the pain never goes away completely; most serious pain is constant and it progresses)     - If intermittent: "How long does it last?" "Do you have pain now?"     (Note: Intermittent means the pain goes away completely between bouts)     Comes and goes 6. SEVERITY: "How bad is the pain?"  (e.g., Scale 1-10; mild, moderate, or severe)   - MILD (1-3): doesn't interfere with normal activities, abdomen soft and not tender to touch    - MODERATE (4-7): interferes with normal activities or awakens from sleep, tender to touch    - SEVERE (8-10): excruciating pain, doubled over, unable to do any normal activities      8 7. RECURRENT SYMPTOM: "Have you ever had this type of abdominal pain before?" If so, ask: "When was the last time?" and "What happened that time?"      No 8. CAUSE: "What do you think is causing the abdominal pain?"     Unsure 9. RELIEVING/AGGRAVATING FACTORS: "What makes  it better or worse?" (e.g., movement, antacids, bowel movement)     Sometimes eating helps 10. OTHER SYMPTOMS: "Has there been any vomiting, diarrhea, constipation, or urine problems?"       Nausea  11. PREGNANCY: "Is there any chance you are pregnant?" "When was your last menstrual period?"       No  Protocols used: ABDOMINAL PAIN - [facility]

## 2018-04-12 NOTE — Telephone Encounter (Signed)
Patient has been scheduled today to be seen in the office.

## 2018-04-12 NOTE — Progress Notes (Signed)
Subjective:    Patient ID: Sarah Moran, female    DOB: 01/13/1981, 36 y.o.   MRN: [id number]  CC: Sarah Moran is a 37 y.o. female who presents today for an acute visit.    HPI: CC:  Epigastric abdominal pain x 2 months, comes and goes.  Epigastric burning with eating, wakes up, 'constant'. Worse when eats 'certain foods.' Has been doing more liquid. Subsided with eating banana. 4 days ago had an episode after an apple in epigastric pain was more severe,  Endorses belching. Doesn't feel more flatus than normal. Had been on nexium when belching.   No sour taste in mouth, diarrhea, vomiting.   Drinks coffee and burping of coffee.  Had a small bowel movement today, takes miraleax. Descrives as skinny strings. Uses fleet enema and miralax prn to keep herself regular.   H/o IBS-C , GERD.   No DM.   Drinks occassionally alcohol. No NSAIDs.   Denies exertional chest pain or pressure, numbness or tingling radiating to left arm or jaw, palpitations, dizziness, changes in vision, or shortness of breath.       HISTORY:  Past Medical History:  Diagnosis Date  . Allergy   . Chronic pelvic pain in female   . Family history of breast cancer    My Risk neg 5/18  . Frequent headaches   . Genetic testing of female 01/2017   My Risk/BRCA neg  . GERD (gastroesophageal reflux disease)    well controlled. Takes gingerroot when symptoms  . Hypothyroidism    s/p thyroidectomy - levothyroxine 75 mcg daily  . IBS (irritable bowel syndrome)    constipation - takes miralax daily  . Increased risk of breast cancer 01/2017   IBIS=18.8%/riskscore=21.9%  . Migraine    takes excedrin. She has tried imitrex in the past   . Thyroid cancer [facility]) 2007   s/p thyroidectomy   Past Surgical History:  Procedure Laterality Date  . BACK SURGERY  1997   scoliosis  . COLONOSCOPY    . HERNIA REPAIR  1990   bilateral inguinal  . THYROIDECTOMY  2007   thyroid cancer  . TOTAL ABDOMINAL HYSTERECTOMY   2008   dymenorrhea   Family History  Problem Relation Age of Onset  . Hyperlipidemia Father   . Hypertension Father   . Clotting disorder Sister   . Hepatitis B Brother   . Cancer Paternal Grandmother 47       breast cancer? with mets  . Cancer Mother        Cervical Cancer  . Cancer Maternal Aunt        throat cancer  . Breast cancer Paternal Aunt        74  . Breast cancer Paternal Aunt        35  . Breast cancer Paternal Aunt        12    Allergies: Darvon [propoxyphene]; Percocet [oxycodone-acetaminophen]; and Vicodin [hydrocodone-acetaminophen] Current Outpatient Medications on File Prior to Visit  Medication Sig Dispense Refill  . Cholecalciferol (VITAMIN D) 2000 UNITS tablet Take 2,000 Units by mouth daily.    . COD LIVER OIL PO Take by mouth.    . levocetirizine (XYZAL) 5 MG tablet Take 5 mg by mouth every evening.    . Multiple Vitamin (MULTIVITAMIN) capsule Take 1 capsule by mouth daily.    . polyethylene glycol (MIRALAX / GLYCOLAX) packet Take 17 g by mouth daily.    . SUMAtriptan (IMITREX) 50 MG tablet TAKE ONE TABLET  BY MOUTH AT ONSET OF MIGRAINE -- MAY REPEAT DOSE IN 2 HOURS IF HEADACHE PERSISTS OR RECURS. 10 tablet 11  . SYNTHROID 75 MCG tablet Take 1 tablet (75 mcg total) by mouth daily before breakfast. 90 tablet 1  . tiZANidine (ZANAFLEX) 4 MG tablet Take 1 tablet (4 mg total) by mouth every 6 (six) hours as needed for muscle spasms. 30 tablet 0  . vitamin C (ASCORBIC ACID) 500 MG tablet Take 500 mg by mouth daily.     No current facility-administered medications on file prior to visit.     Social History   Tobacco Use  . Smoking status: Never Smoker  . Smokeless tobacco: Never Used  Substance Use Topics  . Alcohol use: Yes    Comment: Occasionally - very rarely  . Drug use: No    Review of Systems  Constitutional: Negative for chills and fever.  Respiratory: Negative for cough.   Cardiovascular: Negative for chest pain and palpitations.    Gastrointestinal: Positive for abdominal pain and constipation. Negative for abdominal distention, blood in stool, diarrhea, nausea and vomiting.  Genitourinary: Negative for dysuria and hematuria.      Objective:    BP 92/64 (BP Location: Left Arm, Patient Position: Sitting, Cuff Size: Normal)   Pulse 76   Temp 98.2 F (36.8 C) (Oral)   Resp 14   Ht 5' 1" (1.549 m)   Wt 134 lb 1.9 oz (60.8 kg)   SpO2 99%   BMI 25.34 kg/m    Physical Exam  Constitutional: She appears well-developed and well-nourished.  Eyes: Conjunctivae are normal.  Cardiovascular: Normal rate, regular rhythm, normal heart sounds and normal pulses.  Pulmonary/Chest: Effort normal and breath sounds normal. She has no wheezes. She has no rhonchi. She has no rales.  Abdominal: Soft. Normal appearance and bowel sounds are normal. She exhibits no distension, no fluid wave, no ascites and no mass. There is no tenderness. There is no rigidity, no rebound, no guarding, no CVA tenderness and negative Murphy's sign.    Tenderness elicited with palpation of midline abdomen. No distention appreciated.   Neurological: She is alert.  Skin: Skin is warm and dry.  Psychiatric: She has a normal mood and affect. Her speech is normal and behavior is normal. Thought content normal.  Vitals reviewed.      Assessment & Plan:  1. Epigastric pain Patient is well-appearing today.  She is not in any acute distress.   On exam, she does have some tenderness centrally located on her abdomen.  It is not exquisite.  Differentials today include GERD, constipation.  Also considering early obstruction, gallbladder etiology although I suspect these to be less likely.   Pending referral to GI as patient describes stool as pencillike.  This sounds chronic however I advised her that regardless this needs to be evaluated ( suspect she will need repeat colonoscopy as I advised). Return precautions given   - omeprazole (PRILOSEC) 20 MG capsule;  Take 1 capsule (20 mg total) by mouth daily.  Dispense: 30 capsule; Refill: 3 - US Abdomen Complete; Future - Ambulatory referral to Gastroenterology - DG Abd 2 Views - Comprehensive metabolic panel - CBC with Differential/Platelet - Hemoglobin A1c    I have discontinued Sarah Moran's benzonatate, azithromycin, and predniSONE. I am also having her maintain her Vitamin D, polyethylene glycol, COD LIVER OIL PO, vitamin C, SUMAtriptan, tiZANidine, multivitamin, levocetirizine, and SYNTHROID.   No orders of the defined types were placed in this encounter.  Return precautions given.   Risks, benefits, and alternatives of the medications and treatment plan prescribed today were discussed, and patient expressed understanding.   Education regarding symptom management and diagnosis given to patient on AVS.  Continue to follow with Leone Haven, MD for routine health maintenance.   Sarah Moran and I agreed with plan.   Mable Paris, FNP

## 2018-04-12 NOTE — Addendum Note (Signed)
Addended by: Burnard Hawthorne on: 04/12/2018 02:10 PM   Modules accepted: Orders

## 2018-04-16 ENCOUNTER — Encounter: Payer: Self-pay | Admitting: Family

## 2018-04-19 DIAGNOSIS — K7689 Other specified diseases of liver: Secondary | ICD-10-CM

## 2018-04-19 HISTORY — DX: Other specified diseases of liver: K76.89

## 2018-04-20 ENCOUNTER — Encounter: Payer: Self-pay | Admitting: Family Medicine

## 2018-04-23 ENCOUNTER — Ambulatory Visit: Payer: BLUE CROSS/BLUE SHIELD | Admitting: Family Medicine

## 2018-04-23 ENCOUNTER — Encounter: Payer: Self-pay | Admitting: Family Medicine

## 2018-04-23 VITALS — BP 100/70 | HR 84 | Temp 97.9°F | Wt 133.0 lb

## 2018-04-23 DIAGNOSIS — F419 Anxiety disorder, unspecified: Secondary | ICD-10-CM

## 2018-04-23 DIAGNOSIS — R202 Paresthesia of skin: Secondary | ICD-10-CM | POA: Insufficient documentation

## 2018-04-23 DIAGNOSIS — E039 Hypothyroidism, unspecified: Secondary | ICD-10-CM

## 2018-04-23 DIAGNOSIS — G44209 Tension-type headache, unspecified, not intractable: Secondary | ICD-10-CM

## 2018-04-23 DIAGNOSIS — F32A Depression, unspecified: Secondary | ICD-10-CM | POA: Insufficient documentation

## 2018-04-23 DIAGNOSIS — M791 Myalgia, unspecified site: Secondary | ICD-10-CM

## 2018-04-23 DIAGNOSIS — G629 Polyneuropathy, unspecified: Secondary | ICD-10-CM | POA: Diagnosis not present

## 2018-04-23 NOTE — Assessment & Plan Note (Signed)
Patient with a variety of symptoms.  These may be linked or may be separate issues.  I suspect that headaches are tension headaches possibly related to anxiety versus sinus headaches.  Her muscle aches could be thyroid related or related to some other undetermined cause.  We will check lab work as outlined below to evaluate for specific causes.  She recently had a CMP checked which did not note any cause.

## 2018-04-23 NOTE — Assessment & Plan Note (Signed)
Symptoms somewhat concerning for neuropathy.  She is neurologically intact.  Recent A1c was acceptable.  We will check a B12 level.

## 2018-04-23 NOTE — Assessment & Plan Note (Signed)
Check TSH.  Continue Synthroid. 

## 2018-04-23 NOTE — Progress Notes (Signed)
Sarah Rumps, MD Phone: [phone]  Sarah Moran is a 37 y.o. female who presents today for same day visit.  CC: Headache, muscle aches, cold/tingling hands and feet, anxiety, allergies  Patient notes over the last 3 or so months she has felt as though her hands and feet feel cold and tingly at times.  They come and go.  Its worse when she is lying down to sleep.  Does hurt from her hips down to her legs at times.  Does note some increased dry skin.  Does note dry brittle hair.  Feels like she needs to stretch.  Some intermittent muscle cramps.  She has also had some tension/sinus headaches with this.  Typically occur frontally and in her maxillary sinuses though also in the back of her head and her neck.  Does not feel like a migraine.  Excedrin is typically beneficial.  No vision changes, numbness, or weakness.  She does note some mild muscle aches with all of this.  She does note having chronic anxiety.  She notes she was depressed in the past and had a lot of crying episodes though that has resolved.  She notes no SI.  She does note sinus drainage that is chronic.  She was taking Claritin though that was not beneficial anymore so she tried Zyrtec and that made her drowsy and then she tried Xyzal which has not been helpful.  She does not consistently use Flonase.  Social History   Tobacco Use  Smoking Status Never Smoker  Smokeless Tobacco Never Used     ROS see history of present illness  Objective  Physical Exam Vitals:   04/23/18 1455  BP: 100/70  Pulse: 84  Temp: 97.9 F (36.6 C)  SpO2: 96%    BP Readings from Last 3 Encounters:  04/23/18 100/70  04/12/18 92/64  12/11/17 98/64   Wt Readings from Last 3 Encounters:  04/23/18 133 lb (60.3 kg)  04/12/18 134 lb 1.9 oz (60.8 kg)  12/11/17 134 lb 9.6 oz (61.1 kg)    Physical Exam  Constitutional: No distress.  Cardiovascular: Normal rate, regular rhythm and normal heart sounds.  Pulmonary/Chest: Effort normal  and breath sounds normal.  Musculoskeletal: She exhibits no edema.  Neurological: She is alert.  CN 2-12 intact, 5/5 strength in bilateral biceps, triceps, grip, quads, hamstrings, plantar and dorsiflexion, sensation to light touch intact in bilateral UE and LE, normal gait, 2+ patellar and brachioradialis reflexes  Skin: Skin is warm and dry. She is not diaphoretic.     Assessment/Plan: Please see individual problem list.  Hypothyroidism Check TSH.  Continue Synthroid.  Muscle ache Patient with a variety of symptoms.  These may be linked or may be separate issues.  I suspect that headaches are tension headaches possibly related to anxiety versus sinus headaches.  Her muscle aches could be thyroid related or related to some other undetermined cause.  We will check lab work as outlined below to evaluate for specific causes.  She recently had a CMP checked which did not note any cause.    Neuropathy Symptoms somewhat concerning for neuropathy.  She is neurologically intact.  Recent A1c was acceptable.  We will check a B12 level.  Anxiety Reports anxiety symptoms.  No depression recently.  Offered treatment or referral to therapy though we opted to defer until we have lab evaluation back.  Tension headache Headaches likely related to tension headaches.  Potentially anxiety is contributing.  Potentially could also be related to sinus issues.  We will have her trial Flonase.  Consider treatment for anxiety once lab work has returned.   Orders Placed This Encounter  Procedures  . TSH  . CK (Creatine Kinase)  . Sedimentation rate  . B12    No orders of the defined types were placed in this encounter.    Sarah Rumps, MD [place]

## 2018-04-23 NOTE — Assessment & Plan Note (Signed)
Headaches likely related to tension headaches.  Potentially anxiety is contributing.  Potentially could also be related to sinus issues.  We will have her trial Flonase.  Consider treatment for anxiety once lab work has returned.

## 2018-04-23 NOTE — Patient Instructions (Addendum)
Nice to see you. We are going to start with lab work and then we will determine the next step.  Depending on the results we may proceed with treatment for anxiety. Please try using the Flonase daily. If you develop worsening headaches or you develop numbness, weakness, vision changes, or any new or changing symptoms please seek medical attention immediately.

## 2018-04-23 NOTE — Assessment & Plan Note (Signed)
Reports anxiety symptoms.  No depression recently.  Offered treatment or referral to therapy though we opted to defer until we have lab evaluation back.

## 2018-04-24 LAB — VITAMIN B12: Vitamin B-12: 345 pg/mL (ref 211–911)

## 2018-04-24 LAB — TSH: TSH: 4.5 u[IU]/mL (ref 0.35–4.50)

## 2018-04-24 LAB — CK: Total CK: 56 U/L (ref 7–177)

## 2018-04-24 LAB — SEDIMENTATION RATE: Sed Rate: 9 mm/hr (ref 0–20)

## 2018-04-26 ENCOUNTER — Ambulatory Visit
Admission: RE | Admit: 2018-04-26 | Discharge: 2018-04-26 | Disposition: A | Discharge status: Home / Self Care | Payer: BLUE CROSS/BLUE SHIELD | Source: Ambulatory Visit | Attending: Family | Admitting: Family

## 2018-04-26 DIAGNOSIS — K7689 Other specified diseases of liver: Secondary | ICD-10-CM | POA: Diagnosis not present

## 2018-04-26 DIAGNOSIS — R1013 Epigastric pain: Secondary | ICD-10-CM | POA: Insufficient documentation

## 2018-04-26 DIAGNOSIS — K8020 Calculus of gallbladder without cholecystitis without obstruction: Secondary | ICD-10-CM | POA: Insufficient documentation

## 2018-04-27 ENCOUNTER — Encounter: Payer: Self-pay | Admitting: Family

## 2018-04-27 ENCOUNTER — Telehealth: Payer: Self-pay | Admitting: Family Medicine

## 2018-04-27 NOTE — Telephone Encounter (Signed)
Due for mammogram  Location Under the right arm pit near breast  Has appointment with Ander Purpura NP on Monday

## 2018-04-27 NOTE — Telephone Encounter (Signed)
Copied from [facility] [id number]. Topic: Quick Communication - See Telephone Encounter >> Apr 27, 2018 12:31 PM Rutherford Nail, [place] wrote: CRM for notification. See Telephone encounter for: 04/27/18. Patient calling and states that she received a call for procedure prep. Was not sure what that was regarding. Thought it couldve been about her ultrasound results. Please advise.

## 2018-04-27 NOTE — Telephone Encounter (Signed)
Did Sarah Moran want additional imaging.

## 2018-04-28 ENCOUNTER — Other Ambulatory Visit: Payer: Self-pay | Admitting: Family Medicine

## 2018-04-28 DIAGNOSIS — G629 Polyneuropathy, unspecified: Secondary | ICD-10-CM

## 2018-04-29 ENCOUNTER — Encounter: Payer: Self-pay | Admitting: Family

## 2018-04-30 ENCOUNTER — Encounter: Payer: Self-pay | Admitting: Family Medicine

## 2018-04-30 ENCOUNTER — Ambulatory Visit: Payer: BLUE CROSS/BLUE SHIELD | Admitting: Family Medicine

## 2018-04-30 VITALS — BP 108/78 | HR 73 | Temp 98.2°F | Wt 133.0 lb

## 2018-04-30 DIAGNOSIS — R319 Hematuria, unspecified: Secondary | ICD-10-CM | POA: Diagnosis not present

## 2018-04-30 DIAGNOSIS — R300 Dysuria: Secondary | ICD-10-CM | POA: Diagnosis not present

## 2018-04-30 DIAGNOSIS — R1031 Right lower quadrant pain: Secondary | ICD-10-CM

## 2018-04-30 DIAGNOSIS — N390 Urinary tract infection, site not specified: Secondary | ICD-10-CM | POA: Diagnosis not present

## 2018-04-30 LAB — POCT URINALYSIS DIPSTICK
Bilirubin, UA: NEGATIVE
Blood, UA: POSITIVE
Glucose, UA: NEGATIVE
Ketones, UA: NEGATIVE
Leukocytes, UA: NEGATIVE
Nitrite, UA: NEGATIVE
Protein, UA: NEGATIVE
Spec Grav, UA: <=1.005 — AB (ref 1.010–1.025)
Urobilinogen, UA: 0.2 E.U./dL
pH, UA: 7 (ref 5.0–8.0)

## 2018-04-30 LAB — BASIC METABOLIC PANEL
BUN: 6 mg/dL (ref 6–23)
CO2: 32 mEq/L (ref 19–32)
Calcium: 9.7 mg/dL (ref 8.4–10.5)
Chloride: 103 mEq/L (ref 96–112)
Creatinine, Ser: 0.78 mg/dL (ref 0.40–1.20)
GFR: 87.37 mL/min (ref >60.00)
Glucose, Bld: 100 mg/dL — ABNORMAL HIGH (ref 70–99)
Potassium: 3.8 mEq/L (ref 3.5–5.1)
Sodium: 139 mEq/L (ref 135–145)

## 2018-04-30 LAB — CBC
HCT: 37.4 % (ref 36.0–46.0)
Hemoglobin: 12.7 g/dL (ref 12.0–15.0)
MCHC: 34.1 g/dL (ref 30.0–36.0)
MCV: 90.3 fl (ref 78.0–100.0)
Platelets: 308 10*3/uL (ref 150.0–400.0)
RBC: 4.14 Mil/uL (ref 3.87–5.11)
RDW: 13.1 % (ref 11.5–15.5)
WBC: 6.3 10*3/uL (ref 4.0–10.5)

## 2018-04-30 MED ORDER — SULFAMETHOXAZOLE-TRIMETHOPRIM 800-160 MG PO TABS: 1.0000 | ORAL_TABLET | Freq: Two times a day (BID) | ORAL | 0 refills | Status: AC

## 2018-04-30 MED ORDER — TAMSULOSIN HCL 0.4 MG PO CAPS: 0.4000 mg | ORAL_CAPSULE | Freq: Every day | ORAL | 0 refills | Status: DC

## 2018-04-30 NOTE — Progress Notes (Signed)
   Subjective:    Patient ID: Sarah Moran, female    DOB: 02-Jan-1981, 37 y.o.   MRN: [id number]  HPI   Patient presents to clinic due to right lower quadrant and right flank pain.  Patient has been having issues with generalized abdominal pain off and on for past few weeks.  She had ultrasound on 8 August which showed the following:  IMPRESSION: The ultrasound survey is equivocal for acute cholecystitis. While the sonographic Murphy's sign is recorded as positive, biliary ductal dilatation is observed, and there are gallstones present within the gallbladder, there is no gallbladder wall thickening, distention, or pericholecystic fluid. If further evaluation is warranted, nuclear medicine HIDA study would be the test of choice.  Mild fullness in the right kidney collecting system, may represent either the patient's normal anatomy with extrarenal pelvis, or early hydronephrosis.  Left liver cyst.  She has no history of kidney stones.  No history of kidney disease.  She has had a hysterectomy.  Denies fever or chills  Review of Systems   Constitutional: Negative for chills, fatigue and fever.  HENT: Negative for congestion, ear pain, sinus pain and sore throat.   Eyes: Negative.   Respiratory: Negative for cough, shortness of breath and wheezing.   Cardiovascular: Negative for chest pain, palpitations and leg swelling.  Gastrointestinal: Positive for right flank pain and RLQ pain. Negative for diarrhea, nausea and vomiting.  Genitourinary: Positive for frequency and dysuria.  Musculoskeletal: Negative for arthralgias and myalgias. Skin: Negative for color change, pallor and rash.  Neurological: Negative for syncope, light-headedness and headaches.  Psychiatric/Behavioral: The patient is not nervous/anxious.     Objective:   Physical Exam Physical Exam  Constitutional: She is oriented to person, place, and time. She appears well-developed and well-nourished. No distress.    HENT:  Head: Normocephalic and atraumatic.  Cardiovascular: Normal rate, regular rhythm and normal heart sounds.  Pulmonary/Chest: Effort normal and breath sounds normal. No respiratory distress. She has no wheezes. She has no rales.  Abdominal: Soft. Bowel sounds are normal. +right flank pain and rlq tenderness.  Neurological: She is alert and oriented to person, place, and time.  Gait normal  Skin: Skin is warm and dry. No pallor.  Psychiatric: She has a normal mood and affect. Her behavior is normal. Thought content normal.  Nursing note and vitals reviewed.  Vitals:   04/30/18 1326  BP: 108/78  Pulse: 73  Temp: 98.2 F (36.8 C)  SpO2: 99%   Body mass index is 25.13 kg/m.  Assessment & Plan:    Right ABD pain/flank pain -due to findings on ultrasound with possible hydronephrosis of right kidney we will get CT scan to further evaluate.  CBC and BMET ordered. Patient drinks a good amount of water daily and encouraged to continue to do this  UTI/dysuria -urinalysis dipstick did show some blood, patient no longer gets menses due to hysterectomy.  Urine culture collected and sent to lab.  We will cover with 3-day course of Bactrim twice daily and trial short course of Flomax to see if this helps her urgency and frequency.  Return to clinic if symptoms do not improve or worsen. Keep November appt as scheduled.

## 2018-04-30 NOTE — Telephone Encounter (Signed)
Patient has appointment today with Lauren,NP for ?UTI

## 2018-04-30 NOTE — Telephone Encounter (Signed)
Her call was on 8/9 ,[place] ultrasound was 8/8.  I do not know what the prep is for.  Ask her if she follows with other specialists- perhaps she has another test coming up.  Otherwise perhaps she missed understood a message from our office- there were several mychart messages  That day

## 2018-04-30 NOTE — Patient Instructions (Signed)
Great to meet you!  Bactrim 2x per day for 3 days for UTI  Flomax to help open up and make urination easier  CT scan due to possible hydronephrosis seen on [place]

## 2018-05-01 ENCOUNTER — Encounter: Payer: Self-pay | Admitting: Neurology

## 2018-05-01 LAB — URINE CULTURE
MICRO NUMBER:: 90953042
Result:: NO GROWTH
SPECIMEN QUALITY:: ADEQUATE

## 2018-05-02 NOTE — Telephone Encounter (Signed)
Patient seen by Ander Purpura, NP

## 2018-05-08 ENCOUNTER — Ambulatory Visit
Admission: RE | Admit: 2018-05-08 | Discharge: 2018-05-08 | Disposition: A | Discharge status: Home / Self Care | Payer: BLUE CROSS/BLUE SHIELD | Source: Ambulatory Visit | Attending: Family Medicine | Admitting: Family Medicine

## 2018-05-08 DIAGNOSIS — R1031 Right lower quadrant pain: Secondary | ICD-10-CM | POA: Diagnosis not present

## 2018-05-08 IMAGING — CT CT RENAL STONE PROTOCOL
2 of 4 series · 15 of 46 positions shown, 17 images · non-contrast
Comparison: Ultrasound the abdomen of [DATE]

CLINICAL DATA: Right lower quadrant pain over the last 6 months,
history of ovarian cyst previously

EXAM:
CT ABDOMEN AND PELVIS WITHOUT CONTRAST
TECHNIQUE: Multidetector CT imaging of the abdomen and pelvis was performed
following the standard protocol without IV contrast.

[Series 2: renal (person_name) · axial · 0.63mm/px · z∈[-1565,-1145]mm · 12 of 92 slices shown, 14 images (1 of 2)]
[im 4/92  soft-tissue]
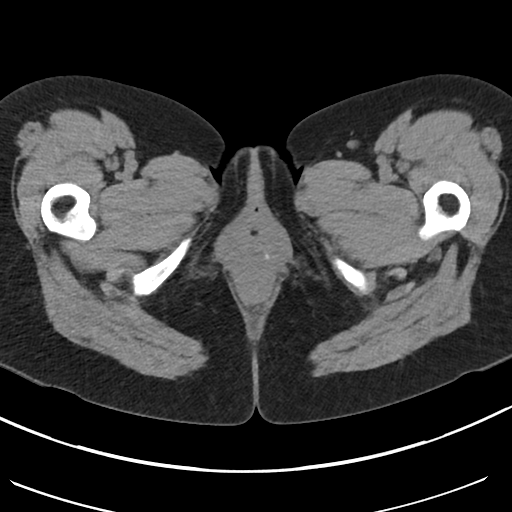
[im 4/92  bone]
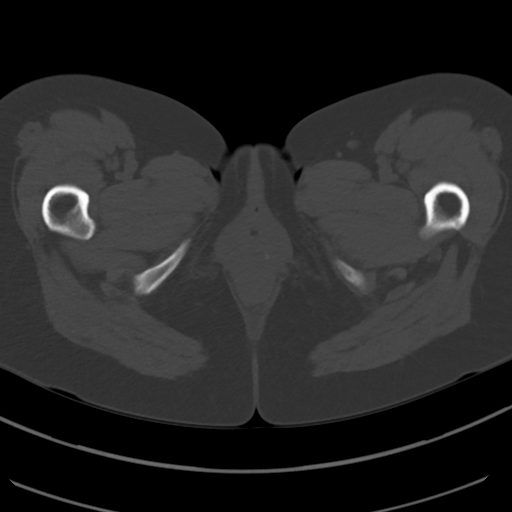
[im 12/92  soft-tissue]
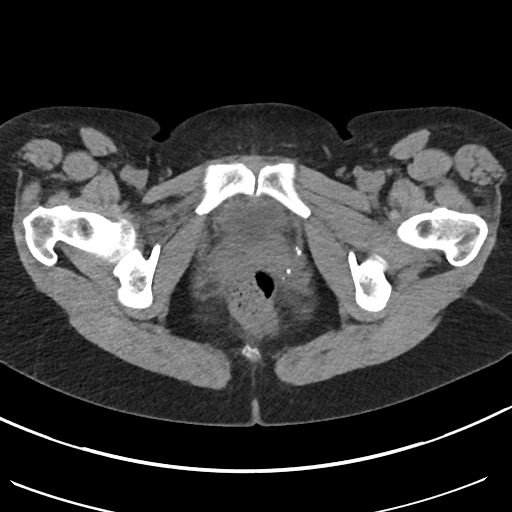
[im 19/92  soft-tissue]
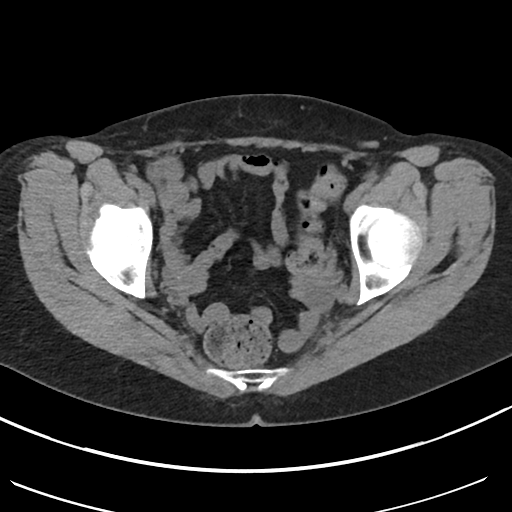
[im 27/92  soft-tissue]
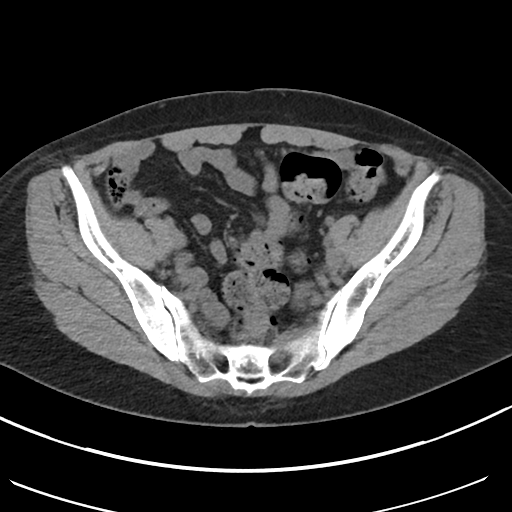
[im 35/92  soft-tissue]
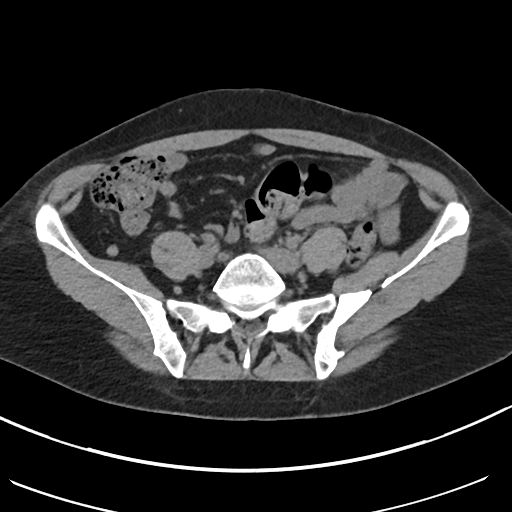
[im 42/92  soft-tissue]
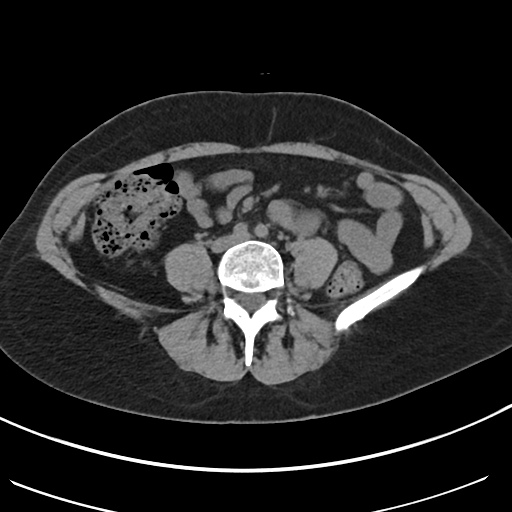
[im 50/92  soft-tissue]
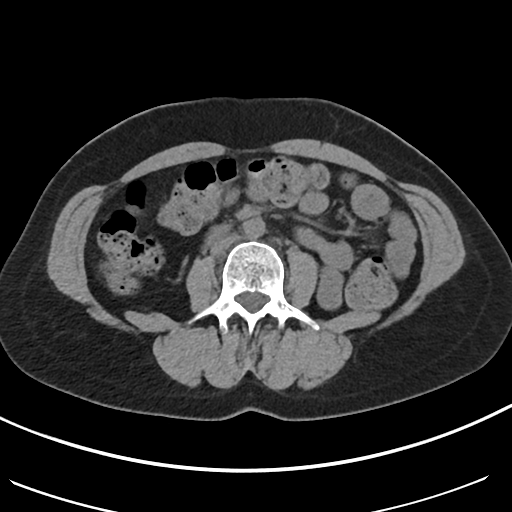
[im 57/92  soft-tissue]
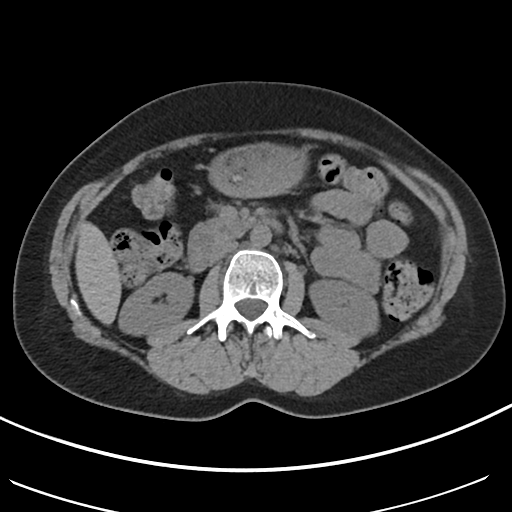
[im 65/92  soft-tissue]
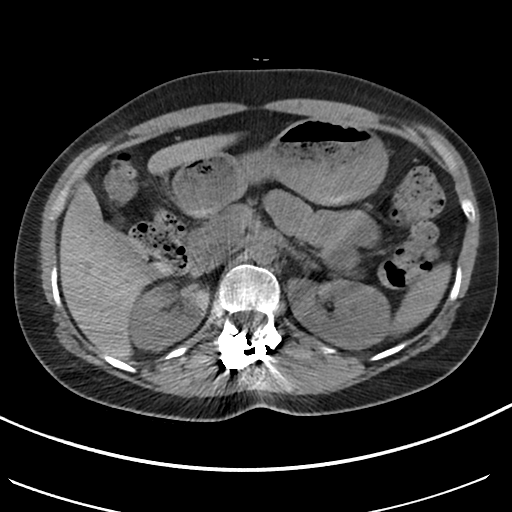
[im 65/92  bone]
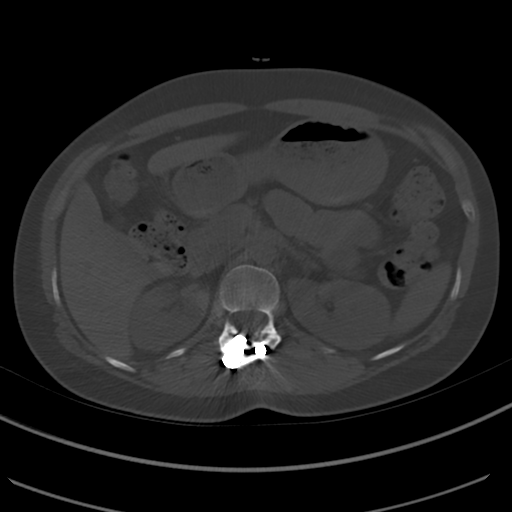
[im 73/92  soft-tissue]
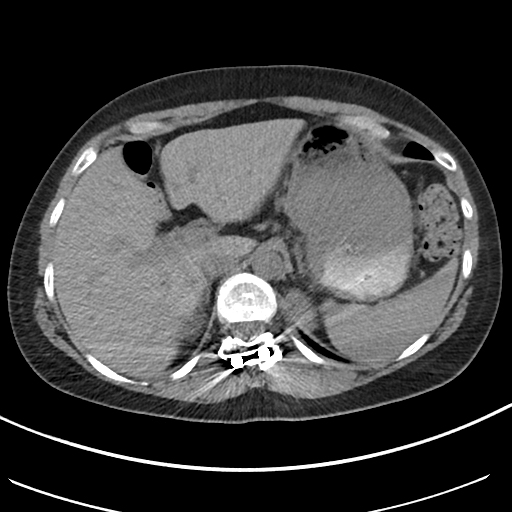
[im 80/92  soft-tissue]
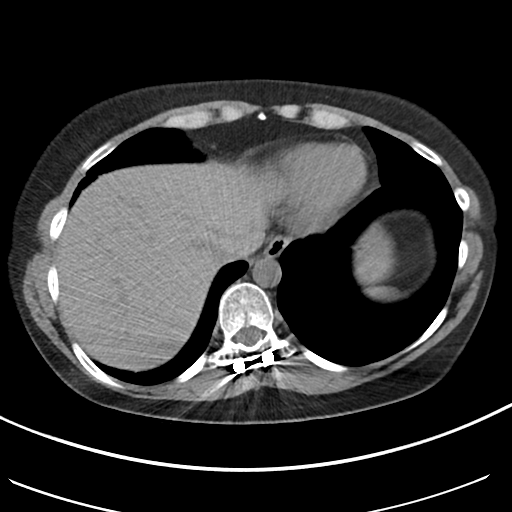
[im 88/92  soft-tissue]
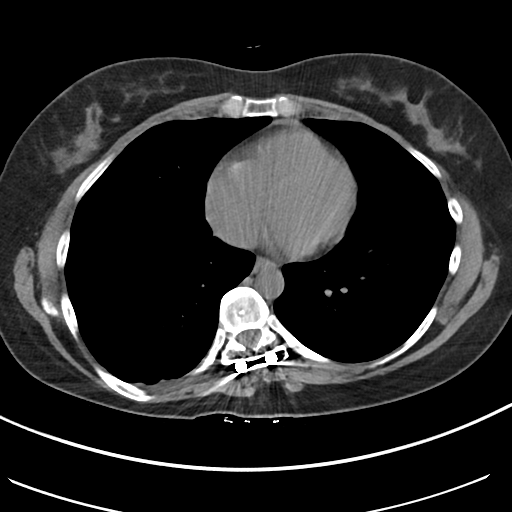

[Series 4: renal (person_name) · coronal · 0.63mm/px · 3 of 160 slices shown (2 of 2)]
[im 54/160  soft-tissue]
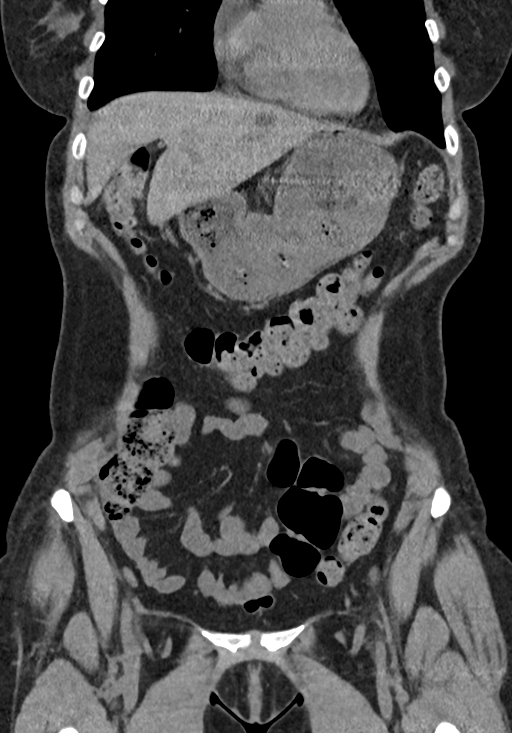
[im 71/160  soft-tissue]
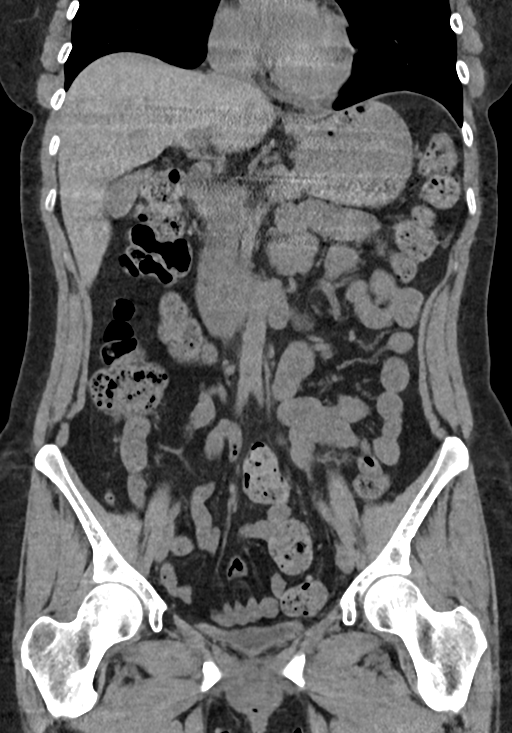
[im 89/160  soft-tissue]
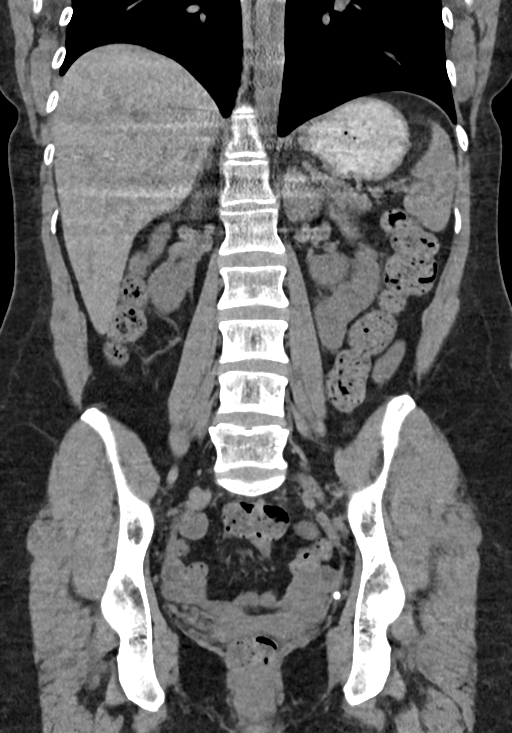

[15 of 46 positions shown; findings below may reference images not displayed]

FINDINGS: Lower chest: The lung bases are clear. Linear artifacts are noted
from Harrington rods posteriorly. The heart is within normal limits
in size.

Hepatobiliary: The liver is unremarkable although there is a cyst in
the left lobe of approximately 3.0 cm in diameter. Smaller cysts are
also noted. Small gallstones layer dependently with the within the
contracted gallbladder.

Pancreas: The pancreas is normal in size and the pancreatic duct is
not dilated.

Spleen: The spleen is unremarkable.

Adrenals/Urinary Tract: The adrenal glands appear normal. No renal
calculi are seen and there is no evidence of hydronephrosis. The
ureters appear normal in caliber. The urinary bladder is not
distended and cannot be evaluated.

Stomach/Bowel: The stomach is moderately distended with fluid and
food debris. No abnormality of small bowel is seen. There is feces
throughout the colon. The terminal ileum is unremarkable. The
appendix is within normal limits in size measuring 7 mm in diameter.
No adjacent inflammatory process is noted but developing
appendicitis cannot be excluded. No current foramina FRANTZ process
is seen however.

Vascular/Lymphatic: The abdominal aorta is normal in caliber. No
adenopathy is seen.

Reproductive: The patient has previously undergone hysterectomy.
Only small follicles are present bilaterally with the larger on the
left of 2.0 cm in diameter with attenuation of 14 HU. No free fluid
is seen within the pelvis.

Other: No abdominal wall hernia is noted.

Musculoskeletal: The lumbar vertebrae are in normal alignment.
Harrington rods are noted from L1 extending cephalad. No compression
deformity is seen.
IMPRESSION: 1. No explanation for the patient's right lower quadrant pain is
seen. The appendix measures within upper limits of normal but no
inflammatory process is seen in the adjacent soft tissues.
2. Only small ovarian follicles are present. No free fluid is noted.

## 2018-05-09 ENCOUNTER — Ambulatory Visit: Payer: BLUE CROSS/BLUE SHIELD | Admitting: Family Medicine

## 2018-05-09 NOTE — Telephone Encounter (Signed)
Ultrasound already done on 04/27/18

## 2018-05-10 ENCOUNTER — Encounter: Payer: Self-pay | Admitting: Family Medicine

## 2018-05-10 ENCOUNTER — Other Ambulatory Visit: Payer: Self-pay | Admitting: Family Medicine

## 2018-05-14 ENCOUNTER — Encounter: Payer: Self-pay | Admitting: Family Medicine

## 2018-05-14 NOTE — Telephone Encounter (Signed)
Last OV 04/23/18 last filled 04/12/17 10 11rf by Mable Paris

## 2018-05-15 ENCOUNTER — Other Ambulatory Visit: Payer: Self-pay | Admitting: Family

## 2018-05-16 ENCOUNTER — Encounter: Payer: Self-pay | Admitting: Obstetrics and Gynecology

## 2018-05-16 ENCOUNTER — Ambulatory Visit (INDEPENDENT_AMBULATORY_CARE_PROVIDER_SITE_OTHER): Payer: BLUE CROSS/BLUE SHIELD | Admitting: Obstetrics and Gynecology

## 2018-05-16 VITALS — BP 100/60 | HR 75 | Ht 60.0 in | Wt 133.0 lb

## 2018-05-16 DIAGNOSIS — Z803 Family history of malignant neoplasm of breast: Secondary | ICD-10-CM | POA: Diagnosis not present

## 2018-05-16 DIAGNOSIS — Z9189 Other specified personal risk factors, not elsewhere classified: Secondary | ICD-10-CM | POA: Diagnosis not present

## 2018-05-16 DIAGNOSIS — Z1231 Encounter for screening mammogram for malignant neoplasm of breast: Secondary | ICD-10-CM | POA: Diagnosis not present

## 2018-05-16 DIAGNOSIS — Z1239 Encounter for other screening for malignant neoplasm of breast: Secondary | ICD-10-CM

## 2018-05-16 DIAGNOSIS — Z01419 Encounter for gynecological examination (general) (routine) without abnormal findings: Secondary | ICD-10-CM | POA: Diagnosis not present

## 2018-05-16 NOTE — Patient Instructions (Signed)
I value your feedback and entrusting us with your care. If you get a [facility] patient survey, I would appreciate you taking the time to let us know about your experience today. Thank you!  [facility]: [phone]    

## 2018-05-16 NOTE — Progress Notes (Signed)
HPI:      [name] is a 37 y.o. No obstetric history on file. who LMP was No LMP recorded. Patient has had a hysterectomy., presents today for her annual examination.  Her menses are absent due to vag hyst due to chronic pelvic pain.  She does not have intermenstrual bleeding. She was referred to urology 2016 by Dr. Leonides Moran due to persistent pelvic pain sx. Pt's sx improved if has regular BMs. Has a hx of IBS with constipation, seeing GI soon. She has urinary incont sx but doesn't want to take meds. She is doing kegels with a little relief.   Sex activity: single partner, contraception - status post hysterectomy.  Last Pap: 12/15/16  Results were: no abnormalities /neg HPV DNA  Hx of STDs: none  Last mammogram: 01/20/17 Results were: normal--routine follow-up in 12 months There is a FH of breast cancer in 3 pat aunts and PGM.  There is a FH of ovarian cancer in her mom. Pt is MyRisk neg 2018. IBIS=18.8%/riskscore=21.9%. The patient does do self-breast exams and is taking Vit D supp. Has not had scr breast MRI but has met deductible this yr and is interested.   Tobacco use: The patient denies current or previous tobacco use. Alcohol use: none Exercise: moderately active  She does get adequate calcium and Vitamin D in her diet.  Labs with PCP.   Past Medical History:  Diagnosis Date  . Allergy   . Chronic pelvic pain in female   . Family history of breast cancer    My Risk neg 5/18  . Frequent headaches   . Gallstone   . Genetic testing of female 01/2017   My Risk/BRCA neg  . GERD (gastroesophageal reflux disease)    well controlled. Takes gingerroot when symptoms  . Hypothyroidism    s/p thyroidectomy - levothyroxine 75 mcg daily  . IBS (irritable bowel syndrome)    constipation - takes miralax daily  . Increased risk of breast cancer 01/2017   IBIS=18.8%/riskscore=21.9%  . Liver cyst 04/2018  . Migraine    takes excedrin. She has tried imitrex in the past   . Thyroid  cancer [facility]) 2007   s/p thyroidectomy    Past Surgical History:  Procedure Laterality Date  . BACK SURGERY  1997   scoliosis  . COLONOSCOPY    . HERNIA REPAIR  1990   bilateral inguinal  . THYROIDECTOMY  2007   thyroid cancer  . TOTAL ABDOMINAL HYSTERECTOMY  2008   dymenorrhea    Family History  Problem Relation Age of Onset  . Hyperlipidemia Father   . Hypertension Father   . Clotting disorder Sister   . Hepatitis B Brother   . Cancer Paternal Grandmother 80       breast cancer? with mets  . Cancer Mother        Cervical Cancer  . Cancer Maternal Aunt        throat cancer  . Breast cancer Paternal Aunt        42  . Breast cancer Paternal Aunt        44  . Breast cancer Paternal Aunt        49     ROS:  Review of Systems  Constitutional: Negative for fever, malaise/fatigue and weight loss.  HENT: Negative for congestion, ear pain and sinus pain.   Respiratory: Negative for cough, shortness of breath and wheezing.   Cardiovascular: Negative for chest pain, orthopnea and leg swelling.  Gastrointestinal: Negative for constipation, diarrhea, nausea and vomiting.  Genitourinary: Positive for frequency. Negative for dysuria, hematuria and urgency.       Breast ROS: negative   Musculoskeletal: Negative for back pain, joint pain and myalgias.  Skin: Negative for itching and rash.  Neurological: Negative for dizziness, tingling, focal weakness and headaches.  Endo/Heme/Allergies: Negative for environmental allergies. Does not bruise/bleed easily.  Psychiatric/Behavioral: Negative for depression and suicidal ideas. The patient is not nervous/anxious and does not have insomnia.     Objective: BP 100/60   Pulse 75   Ht 5' (1.524 m)   Wt 133 lb (60.3 kg)   BMI 25.97 kg/m    Physical Exam  Constitutional: She is oriented to person, place, and time. She appears well-developed and well-nourished.  Genitourinary: Vagina normal. No erythema or tenderness in the  vagina. No vaginal discharge found. Right adnexum does not display mass and does not display tenderness. Left adnexum does not display mass and does not display tenderness.  Neck: Normal range of motion. No thyromegaly present.  Cardiovascular: Normal rate, regular rhythm and normal heart sounds.  No murmur heard. Pulmonary/Chest: Effort normal and breath sounds normal. Right breast exhibits no mass, no nipple discharge, no skin change and no tenderness. Left breast exhibits no mass, no nipple discharge, no skin change and no tenderness.  Abdominal: Soft. There is no tenderness. There is no guarding.  Musculoskeletal: Normal range of motion.  Neurological: She is alert and oriented to person, place, and time. No cranial nerve deficit.  Psychiatric: She has a normal mood and affect. Her behavior is normal.  Vitals reviewed.   Assessment/Plan: Encounter for annual routine gynecological examination  Screening for breast cancer - Pt to sched 3D mammo. - Plan: MM 3D SCREEN BREAST BILATERAL  Family history of breast cancer - Plan: MM 3D SCREEN BREAST BILATERAL  Increased risk of breast cancer - Pt is MyRisk neg, riskscore=21.9%. Cont monthly SBE, yearly mammos and CBE. Add scr breast MRI this yr. Pt to call 11/19 to sched.  - Plan: MM 3D SCREEN BREAST BILATERAL             GYN counsel breast self exam, mammography screening, adequate intake of calcium and vitamin D, Kegel's exercises     F/U  Return in about 1 year (around 05/17/2019).  Sarah Schlafer B. Jesaiah Fabiano, PA-C 05/16/2018 10:08 AM

## 2018-05-18 NOTE — Telephone Encounter (Signed)
One of yours.    

## 2018-05-18 NOTE — Telephone Encounter (Signed)
Last office visit 04/23/18 Last filled 02/15/18 Requesting 90 day supply

## 2018-05-26 IMAGING — US US ABDOMEN COMPLETE
1 series · 13 of 25 positions shown · non-contrast
Comparison: None.

CLINICAL DATA: 37-year-old female with a history of pain

EXAM:
ABDOMEN ULTRASOUND COMPLETE

[Series 1: us abdomen complete · 0.25mm/px · 13 of 94 slices shown]
[im 1/94]
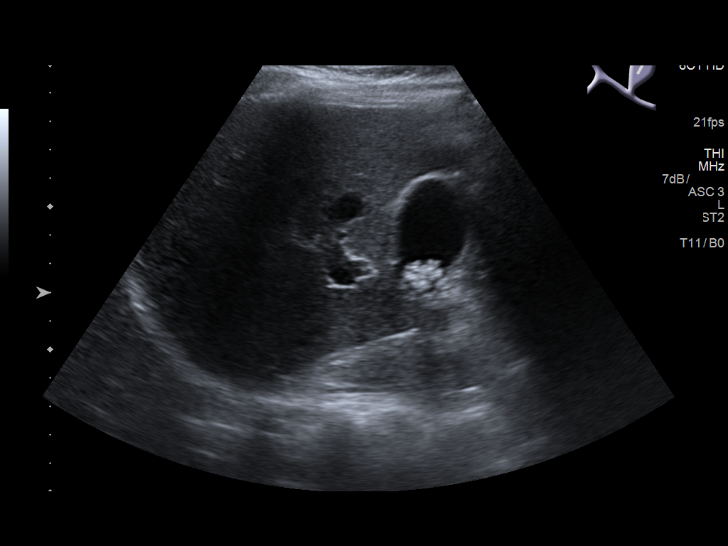
[im 8/94]
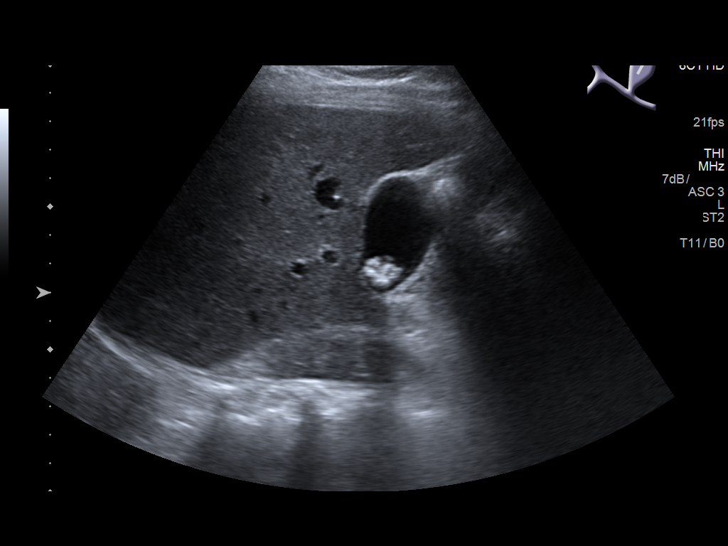
[im 16/94]
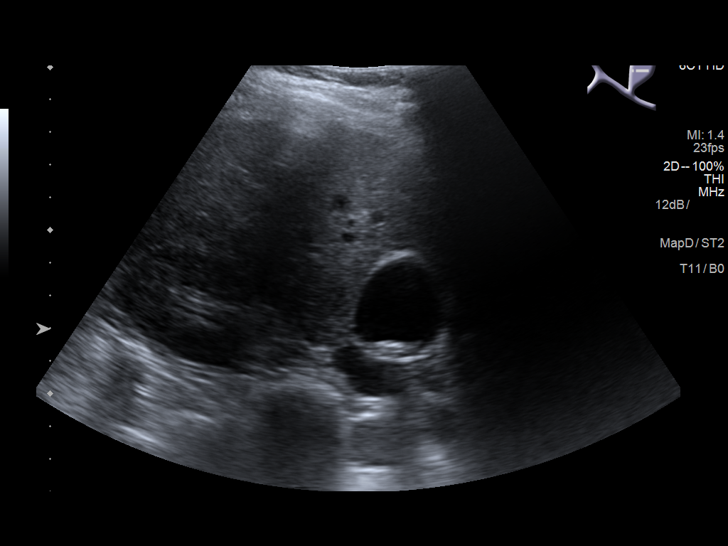
[im 24/94]
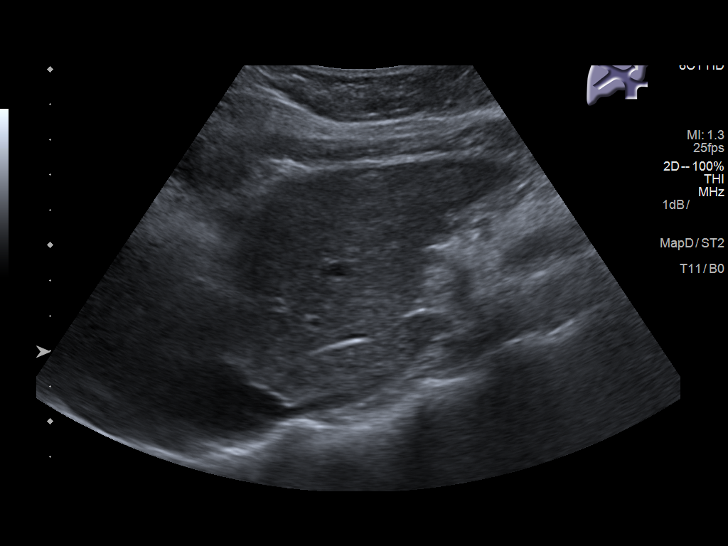
[im 32/94]
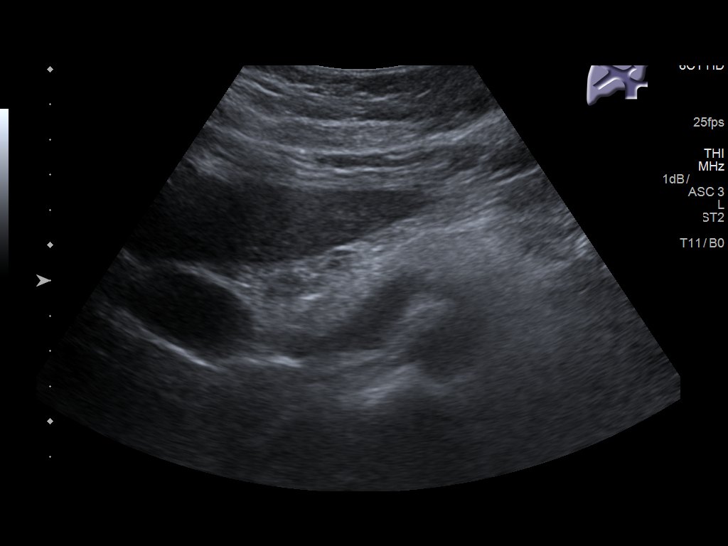
[im 39/94]
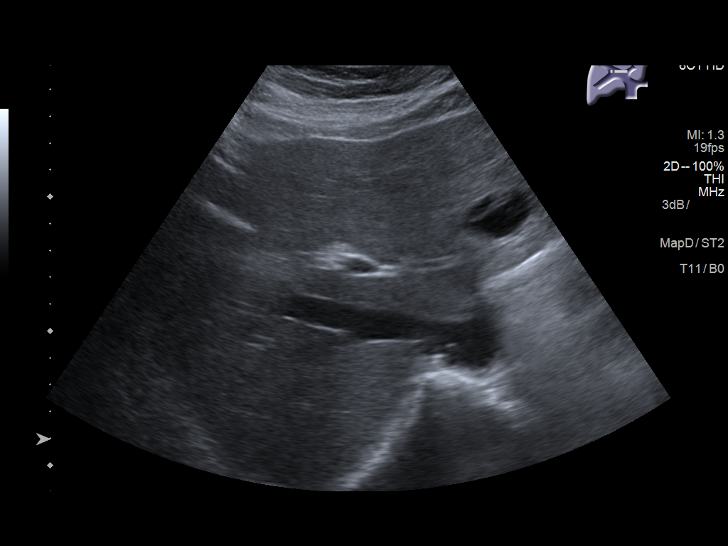
[im 47/94]
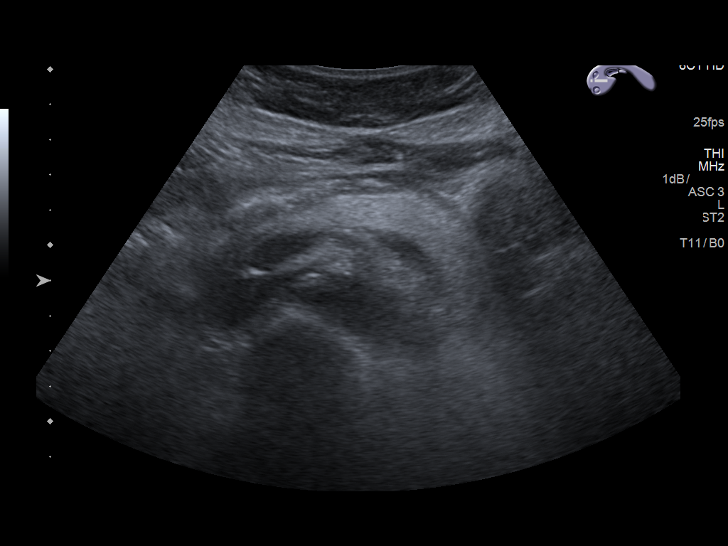
[im 55/94]
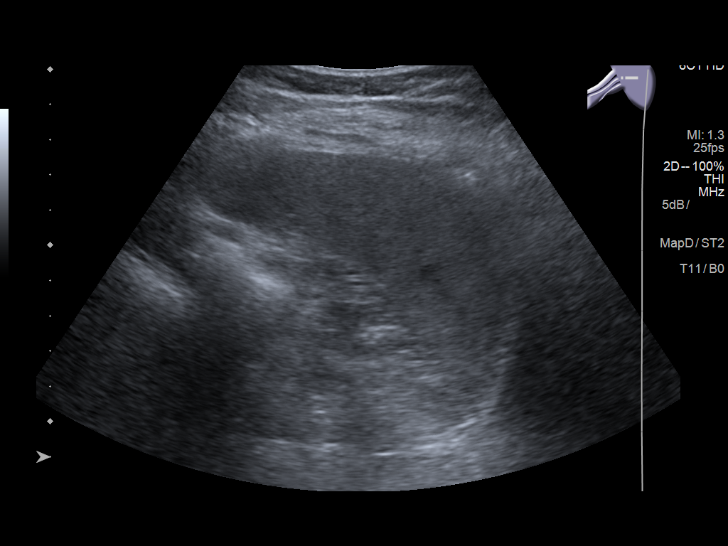
[im 63/94]
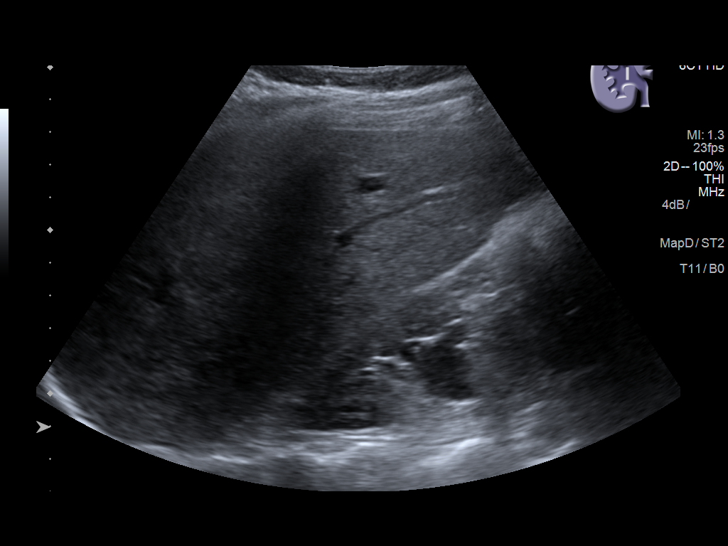
[im 70/94]
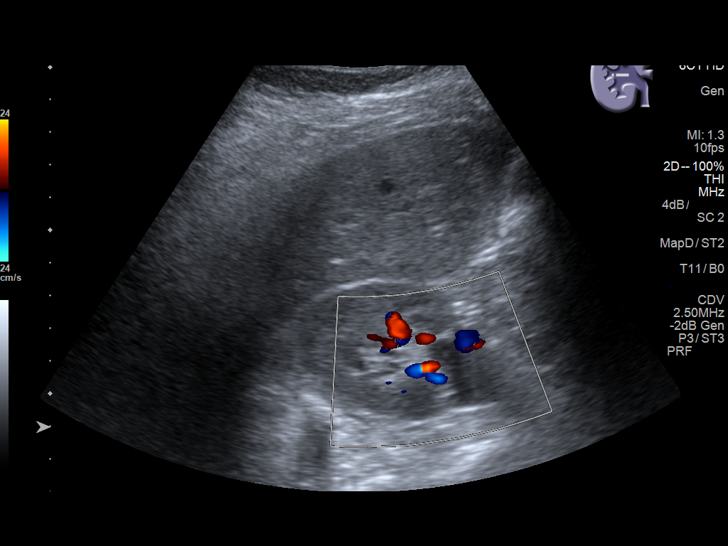
[im 78/94]
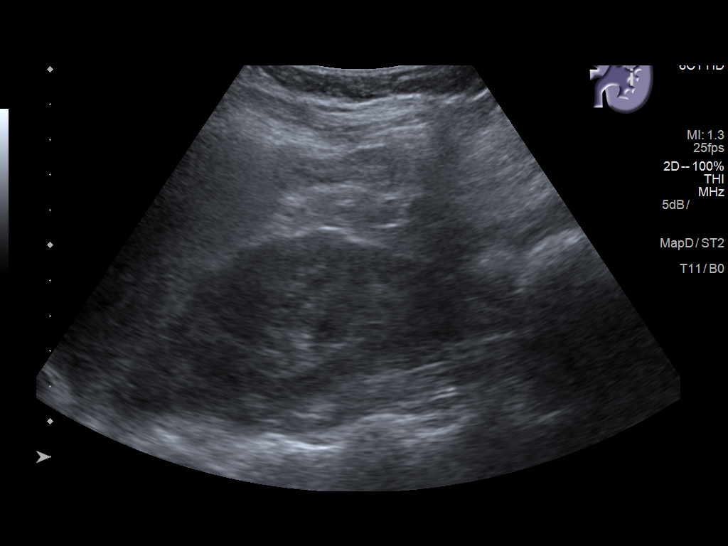
[im 86/94]
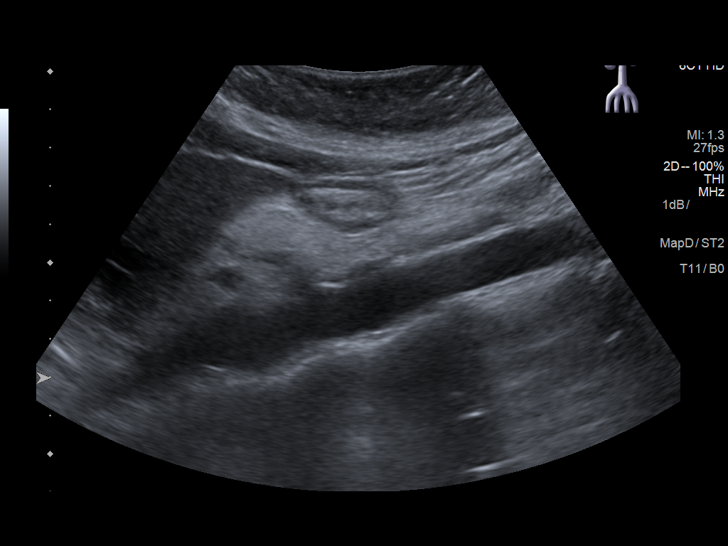
[im 94/94]
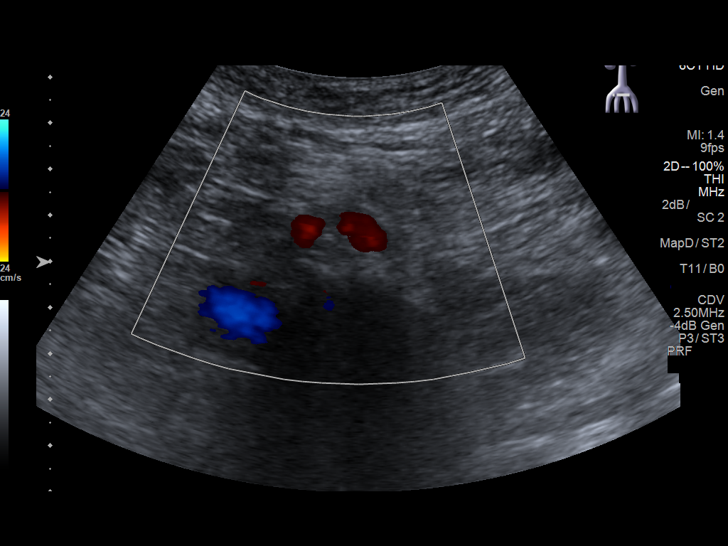

[13 of 25 positions shown; findings below may reference images not displayed]

FINDINGS: Gallbladder: Echogenic gallstones layer dependently within the
gallbladder. Sonographic Murphy's sign is reported as positive. No
gallbladder wall thickening.

Common bile duct: Diameter: 6 mm-7 mm

Liver: Parenchyma of the liver unremarkable. There is anechoic
lesion with through transmission in the left liver measuring 2.9 cm
x 1.6 cm x 3.3 cm. No internal flow or significant complexity.

IVC: No abnormality visualized.

Pancreas: Visualized portion unremarkable.

Spleen: Size and appearance within normal limits.

Right Kidney: Length: 10.8 cm. Mild fullness in the pelvis of the
right kidney. No echogenic foci of the right collecting system.

Left Kidney: Length: 11.4 cm. Echogenicity within normal limits. No
mass or hydronephrosis visualized.

Abdominal aorta: No aneurysm visualized.

Other findings: None.
IMPRESSION: The ultrasound survey is equivocal for acute cholecystitis. While
the sonographic Murphy's sign is recorded as positive, biliary
ductal dilatation is observed, and there are gallstones present
within the gallbladder, there is no gallbladder wall thickening,
distention, or pericholecystic fluid. If further evaluation is
warranted, nuclear medicine HIDA study would be the test of choice.

Mild fullness in the right kidney collecting system, may represent
either the patient's normal anatomy with extrarenal pelvis, or early
hydronephrosis.

Left liver cyst.

## 2018-05-30 ENCOUNTER — Ambulatory Visit
Admission: RE | Admit: 2018-05-30 | Discharge: 2018-05-30 | Disposition: A | Discharge status: Home / Self Care | Payer: BLUE CROSS/BLUE SHIELD | Source: Ambulatory Visit | Attending: Obstetrics and Gynecology | Admitting: Obstetrics and Gynecology

## 2018-05-30 DIAGNOSIS — Z803 Family history of malignant neoplasm of breast: Secondary | ICD-10-CM | POA: Diagnosis not present

## 2018-05-30 DIAGNOSIS — Z1231 Encounter for screening mammogram for malignant neoplasm of breast: Secondary | ICD-10-CM | POA: Insufficient documentation

## 2018-05-30 DIAGNOSIS — Z9189 Other specified personal risk factors, not elsewhere classified: Secondary | ICD-10-CM | POA: Insufficient documentation

## 2018-05-30 DIAGNOSIS — Z1239 Encounter for other screening for malignant neoplasm of breast: Secondary | ICD-10-CM

## 2018-05-30 HISTORY — DX: Personal history of irradiation: Z92.3

## 2018-05-30 IMAGING — MG DIGITAL SCREENING BILATERAL MAMMOGRAM WITH TOMO AND CAD
8 series · 9 of 24 positions shown · non-contrast
Comparison: Previous exam(s).

CLINICAL DATA: Screening.

EXAM:
DIGITAL SCREENING BILATERAL MAMMOGRAM WITH TOMO AND CAD

[R MLO synth-2D]
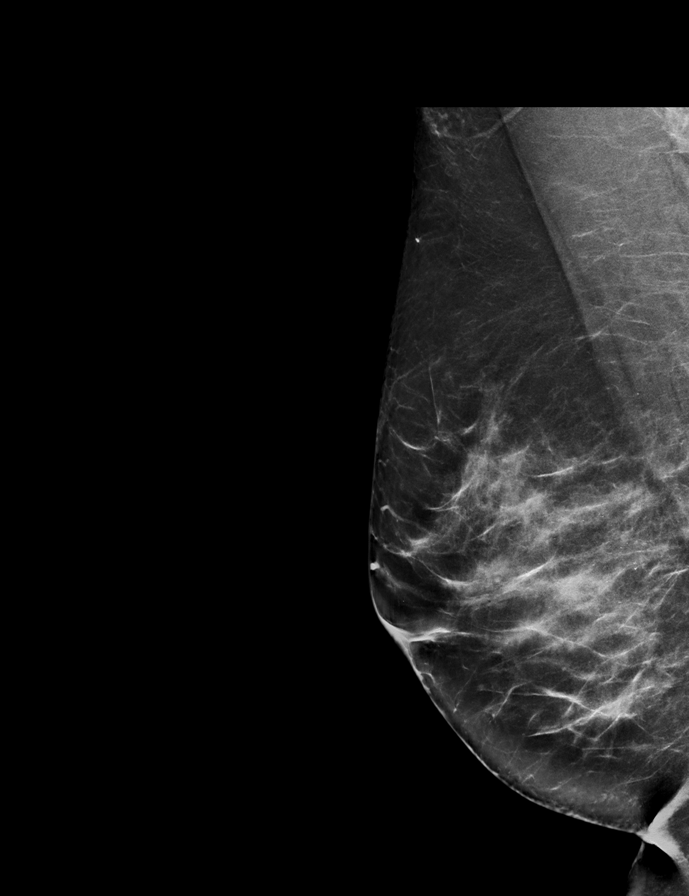

[L CC synth-2D]
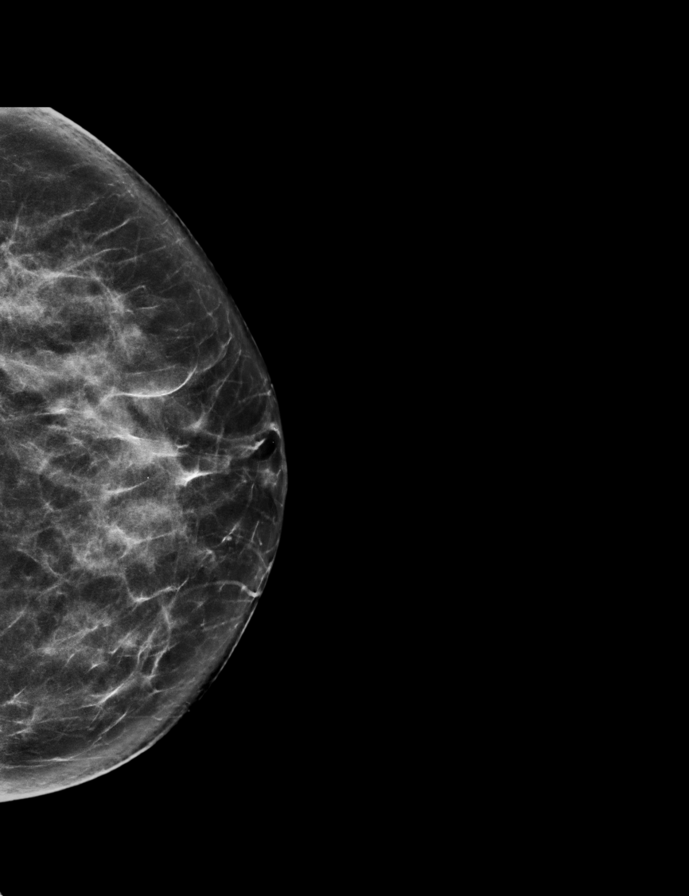

[L MLO synth-2D]
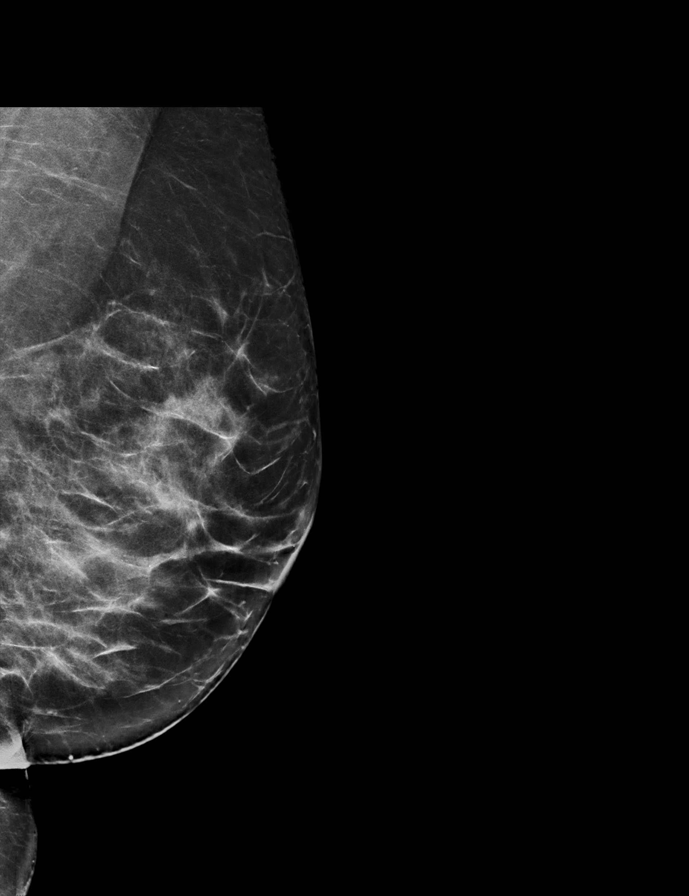

[R CC synth-2D]
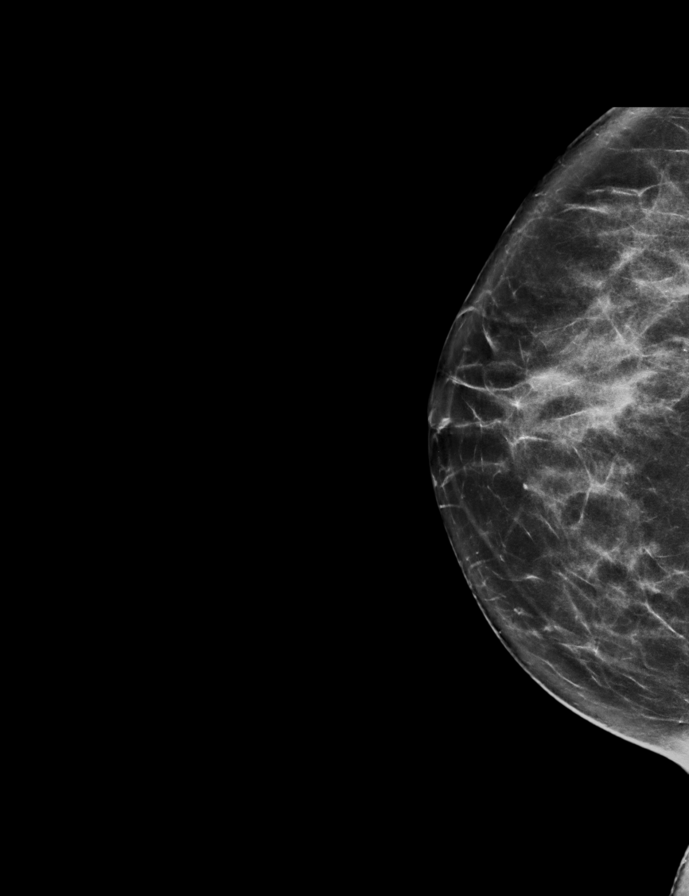

[L CC tomo · 2 of 68 frames shown]
[frame 22/68]
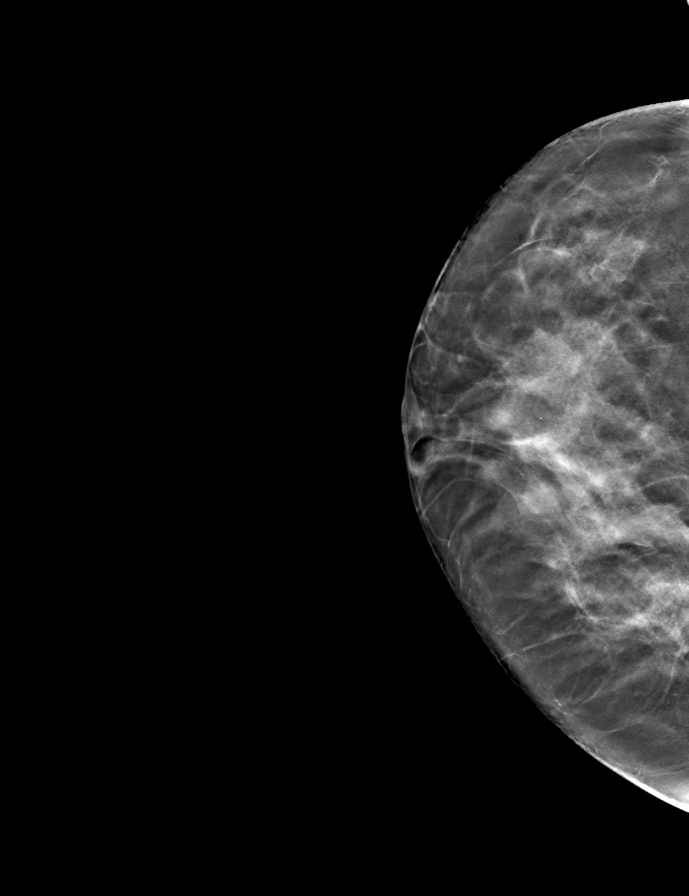
[frame 35/68]
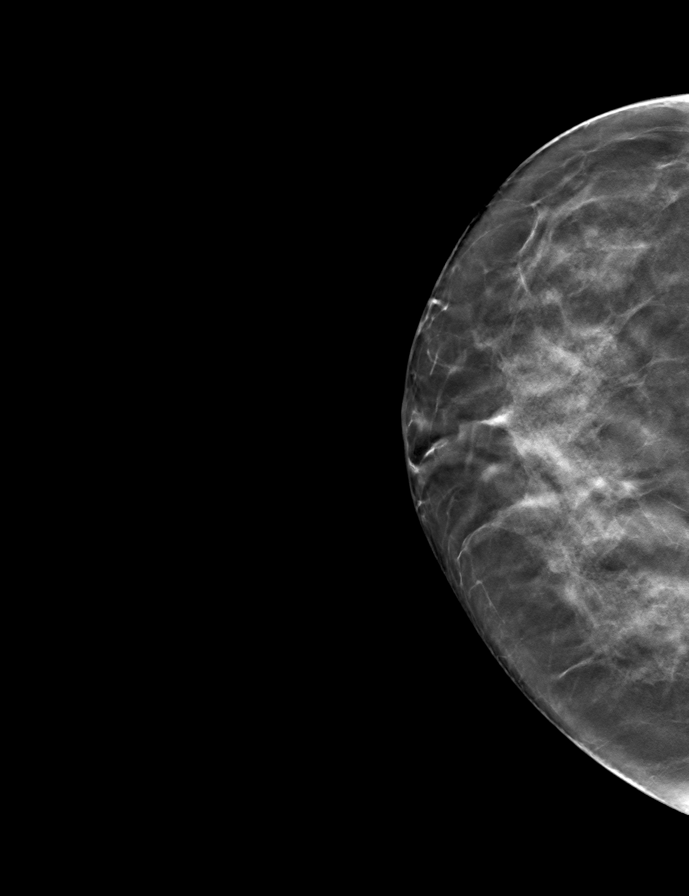

[R MLO tomo · tomo slice 39/76.0]
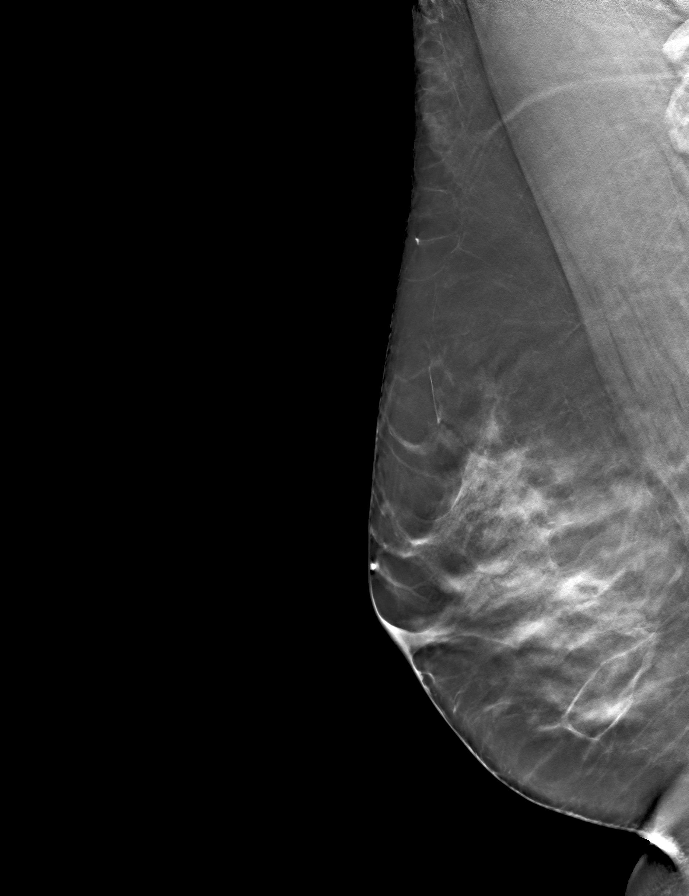

[R CC tomo · tomo slice 35/68.0]
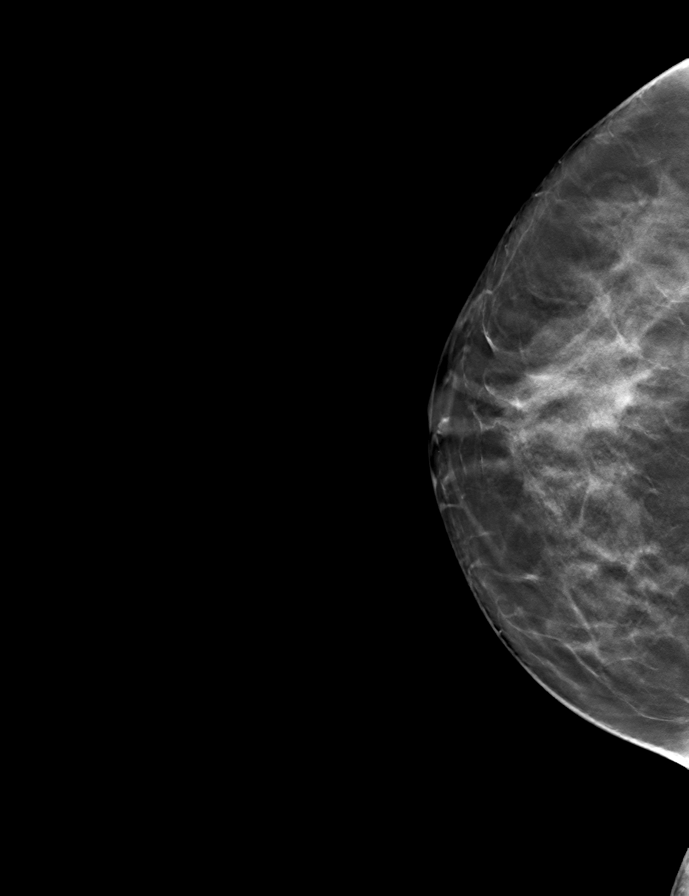

[L MLO tomo · tomo slice 37/74.0]
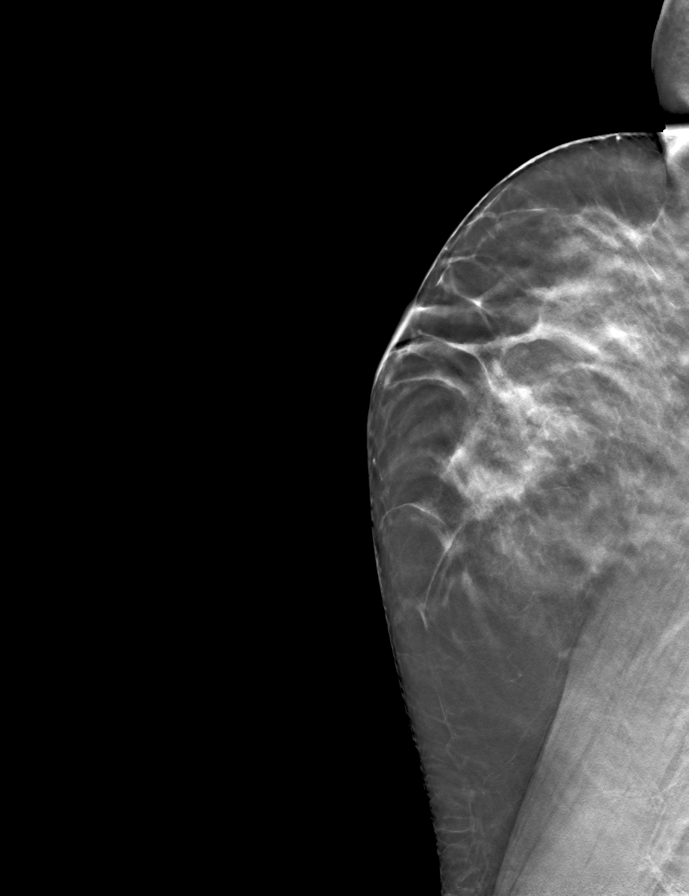

[9 of 24 positions shown; findings below may reference images not displayed]

ACR Breast Density Category c: The breast tissue is heterogeneously
dense, which may obscure small masses.
FINDINGS: There are no findings suspicious for malignancy. Images were
processed with CAD.
IMPRESSION: No mammographic evidence of malignancy. A result letter of this
screening mammogram will be mailed directly to the patient.

RECOMMENDATION:
Screening mammogram in one year. (Code:[5V])

BI-RADS CATEGORY  1: Negative.

## 2018-05-31 ENCOUNTER — Encounter: Payer: Self-pay | Admitting: Obstetrics and Gynecology

## 2018-06-04 ENCOUNTER — Other Ambulatory Visit: Payer: Self-pay

## 2018-06-04 ENCOUNTER — Encounter: Payer: Self-pay | Admitting: Gastroenterology

## 2018-06-04 ENCOUNTER — Ambulatory Visit: Payer: BLUE CROSS/BLUE SHIELD | Admitting: Gastroenterology

## 2018-06-04 ENCOUNTER — Encounter: Payer: Self-pay | Admitting: Family Medicine

## 2018-06-04 VITALS — BP 102/71 | HR 80 | Ht 60.0 in | Wt 133.0 lb

## 2018-06-04 DIAGNOSIS — K5904 Chronic idiopathic constipation: Secondary | ICD-10-CM

## 2018-06-04 DIAGNOSIS — R1013 Epigastric pain: Secondary | ICD-10-CM | POA: Diagnosis not present

## 2018-06-04 NOTE — Progress Notes (Signed)
Cephas Darby, MD [address]  [place]  [place], [place] [address]  Main: [phone]  Fax: [phone]    Gastroenterology Consultation  Referring Provider:     Burnard Hawthorne, FNP Primary Care Physician:  Leone Haven, MD Primary Gastroenterologist:  Dr. Cephas Darby Reason for Consultation:     Dyspepsia and chronic constipation        HPI:   Sarah Moran is a 37 y.o. female referred by Dr. Caryl Bis, Angela Adam, MD  for consultation & management of dyspepsia and chronic constipation.  Chronic constipation: suffering from infrequent bowel movements since the delivery of her child, which is more than 10 years. She manages to incorporate more fiber in her diet, takes MiraLAX daily. Fleet enemas as needed. She reports having 1-2 bowel movements daily but associated with incomplete emptying.   Dyspepsia: for the last 6 months, patient has been experiencing right upper quadrant/epigastric discomfort associated with bloating, early satiety. She underwent right upper quadrant Ultrasound which revealed cholelithiasis. Her LFTs are normal. She is started on omeprazole 20 mg once a day. Patient says that she is generally stressful and anxious person, reports having tension knots. She was recommended to see a therapist by her PCP. She has been delaying to see the therapist because she preferred not to   NSAIDs: none  Antiplts/Anticoagulants/Anti thrombotics: none  GI Procedures: colonoscopy approximately 10 years ago  Reportedly normal  Past Medical History:  Diagnosis Date  . Allergy   . Chronic pelvic pain in female   . Family history of breast cancer    My Risk neg 5/18  . Frequent headaches   . Gallstone   . Genetic testing of female 01/2017   My Risk/BRCA neg  . GERD (gastroesophageal reflux disease)    well controlled. Takes gingerroot when symptoms  . Hypothyroidism    s/p thyroidectomy - levothyroxine 75 mcg daily  . IBS (irritable bowel  syndrome)    constipation - takes miralax daily  . Increased risk of breast cancer 01/2017   IBIS=18.8%/riskscore=21.9%  . Liver cyst 04/2018  . Migraine    takes excedrin. She has tried imitrex in the past   . Personal history of radiation therapy   . Thyroid cancer ([place]) 2007   s/p thyroidectomy    Past Surgical History:  Procedure Laterality Date  . BACK SURGERY  1997   scoliosis  . COLONOSCOPY    . HERNIA REPAIR  1990   bilateral inguinal  . THYROIDECTOMY  2007   thyroid cancer  . TOTAL ABDOMINAL HYSTERECTOMY  2008   dymenorrhea    Current Outpatient Medications:  .  BIOTIN PO, Take by mouth., Disp: , Rfl:  .  Cholecalciferol (VITAMIN D) 2000 UNITS tablet, Take 2,000 Units by mouth daily., Disp: , Rfl:  .  COD LIVER OIL PO, Take by mouth., Disp: , Rfl:  .  levocetirizine (XYZAL) 5 MG tablet, Take 5 mg by mouth every evening., Disp: , Rfl:  .  Omega-3 Fatty Acids (FISH OIL OMEGA-3 PO), Take by mouth., Disp: , Rfl:  .  omeprazole (PRILOSEC) 20 MG capsule, Take 1 capsule (20 mg total) by mouth daily., Disp: 30 capsule, Rfl: 3 .  polyethylene glycol (MIRALAX / GLYCOLAX) packet, Take 17 g by mouth daily., Disp: , Rfl:  .  SUMAtriptan (IMITREX) 50 MG tablet, TAKE ONE TABLET BY MOUTH AT ONSET OF MIGRAINE - MAY REPEAT DOSE IN 2 HOURS IF HEADACHE PERSISTS OR RECURS, Disp: 10 tablet, Rfl:  11 .  SYNTHROID 75 MCG tablet, TAKE 1 TABLET (75 MCG TOTAL) BY MOUTH DAILY BEFORE BREAKFAST., Disp: 90 tablet, Rfl: 1 .  tiZANidine (ZANAFLEX) 4 MG tablet, Take 1 tablet (4 mg total) by mouth every 6 (six) hours as needed for muscle spasms., Disp: 30 tablet, Rfl: 0 .  vitamin C (ASCORBIC ACID) 500 MG tablet, Take 500 mg by mouth daily., Disp: , Rfl:  .  tamsulosin (FLOMAX) 0.4 MG CAPS capsule, Take 1 capsule (0.4 mg total) by mouth daily. (Patient not taking: Reported on 06/04/2018), Disp: 30 capsule, Rfl: 0   Family History  Problem Relation Age of Onset  . Hyperlipidemia Father   . Hypertension  Father   . Clotting disorder Sister   . Hepatitis B Brother   . Cancer Paternal Grandmother 76       breast cancer? with mets  . Breast cancer Paternal Grandmother 25  . Cancer Mother        Cervical Cancer  . Cancer Maternal Aunt        throat cancer  . Breast cancer Paternal Aunt        63  . Breast cancer Paternal Aunt        90  . Breast cancer Paternal Aunt        1     Social History   Tobacco Use  . Smoking status: Never Smoker  . Smokeless tobacco: Never Used  Substance Use Topics  . Alcohol use: Yes    Comment: Occasionally - very rarely  . Drug use: No    Allergies as of 06/04/2018 - Review Complete 06/04/2018  Allergen Reaction Noted  . Darvon [propoxyphene] Other (See Comments) 09/23/2013  . Percocet [oxycodone-acetaminophen] Hives 09/23/2013  . Vicodin [hydrocodone-acetaminophen] Hives 09/23/2013    Review of Systems:    All systems reviewed and negative except where noted in HPI.   Physical Exam:  BP 102/71   Pulse 80   Ht 5' (1.524 m)   Wt 133 lb (60.3 kg)   BMI 25.97 kg/m  No LMP recorded. Patient has had a hysterectomy.  General:   Alert,  Well-developed, well-nourished, pleasant and cooperative in NAD Head:  Normocephalic and atraumatic. Eyes:  Sclera clear, no icterus.   Conjunctiva pink. Ears:  Normal auditory acuity. Nose:  No deformity, discharge, or lesions. Mouth:  No deformity or lesions,oropharynx pink & moist. Neck:  Supple; no masses or thyromegaly. Lungs:  Respirations even and unlabored.  Clear throughout to auscultation.   No wheezes, crackles, or rhonchi. No acute distress. Heart:  Regular rate and rhythm; no murmurs, clicks, rubs, or gallops. Abdomen:  Normal bowel sounds. Soft, non-tender and non-distended without masses, hepatosplenomegaly or hernias noted.  No guarding or rebound tenderness.   Rectal: Not performed Msk:  Symmetrical without gross deformities. Good, equal movement & strength bilaterally. Pulses:  Normal  pulses noted. Extremities:  No clubbing or edema.  No cyanosis. Neurologic:  Alert and oriented x3;  grossly normal neurologically. Skin:  Intact without significant lesions or rashes. No jaundice. Lymph Nodes:  No significant cervical adenopathy. Psych:  Alert and cooperative. Normal mood and affect.  Imaging Studies: reviewed  Assessment and Plan:   Sarah Moran is a 37 y.o. Caucasian female with chronic constipation,six-month history of dyspepsia, and history of anxiety   Chronic constipation: Continue high-fiber diet Adequate fluid intake Trial of linaclotide 145 MCG daily  Chronic dyspepsia: Perform EGD with biopsies Ultrasound revealed cholelithiasis only If EGD is unremarkable, refer to general  surgery to evaluate for possible cholecystectomy   Follow up in 4 weeks   Cephas Darby, MD

## 2018-06-10 ENCOUNTER — Encounter: Payer: Self-pay | Admitting: Gastroenterology

## 2018-06-11 ENCOUNTER — Other Ambulatory Visit: Payer: Self-pay | Admitting: Gastroenterology

## 2018-06-11 ENCOUNTER — Encounter: Payer: Self-pay | Admitting: *Deleted

## 2018-06-11 DIAGNOSIS — K5904 Chronic idiopathic constipation: Secondary | ICD-10-CM

## 2018-06-11 MED ORDER — LINACLOTIDE 145 MCG PO CAPS: 145.0000 ug | ORAL_CAPSULE | Freq: Every day | ORAL | 0 refills | Status: DC

## 2018-06-12 ENCOUNTER — Other Ambulatory Visit: Payer: Self-pay

## 2018-06-12 ENCOUNTER — Ambulatory Visit: Payer: BLUE CROSS/BLUE SHIELD | Admitting: Anesthesiology

## 2018-06-12 ENCOUNTER — Encounter
Admission: RE | Disposition: A | Discharge status: Home / Self Care | Payer: Self-pay | Source: Ambulatory Visit | Attending: Gastroenterology

## 2018-06-12 ENCOUNTER — Ambulatory Visit
Admission: RE | Admit: 2018-06-12 | Discharge: 2018-06-12 | Disposition: A | Discharge status: Home / Self Care | Payer: BLUE CROSS/BLUE SHIELD | Source: Ambulatory Visit | Attending: Gastroenterology | Admitting: Gastroenterology

## 2018-06-12 DIAGNOSIS — Z8049 Family history of malignant neoplasm of other genital organs: Secondary | ICD-10-CM | POA: Diagnosis not present

## 2018-06-12 DIAGNOSIS — Z85850 Personal history of malignant neoplasm of thyroid: Secondary | ICD-10-CM | POA: Insufficient documentation

## 2018-06-12 DIAGNOSIS — K219 Gastro-esophageal reflux disease without esophagitis: Secondary | ICD-10-CM | POA: Insufficient documentation

## 2018-06-12 DIAGNOSIS — E890 Postprocedural hypothyroidism: Secondary | ICD-10-CM | POA: Diagnosis not present

## 2018-06-12 DIAGNOSIS — K2990 Gastroduodenitis, unspecified, without bleeding: Secondary | ICD-10-CM | POA: Diagnosis not present

## 2018-06-12 DIAGNOSIS — Z800 Family history of malignant neoplasm of digestive organs: Secondary | ICD-10-CM | POA: Diagnosis not present

## 2018-06-12 DIAGNOSIS — Z90710 Acquired absence of both cervix and uterus: Secondary | ICD-10-CM | POA: Diagnosis not present

## 2018-06-12 DIAGNOSIS — Z803 Family history of malignant neoplasm of breast: Secondary | ICD-10-CM | POA: Diagnosis not present

## 2018-06-12 DIAGNOSIS — K589 Irritable bowel syndrome without diarrhea: Secondary | ICD-10-CM | POA: Insufficient documentation

## 2018-06-12 DIAGNOSIS — B9681 Helicobacter pylori [H. pylori] as the cause of diseases classified elsewhere: Secondary | ICD-10-CM | POA: Insufficient documentation

## 2018-06-12 DIAGNOSIS — Z8249 Family history of ischemic heart disease and other diseases of the circulatory system: Secondary | ICD-10-CM | POA: Diagnosis not present

## 2018-06-12 DIAGNOSIS — R1013 Epigastric pain: Secondary | ICD-10-CM

## 2018-06-12 DIAGNOSIS — Z8379 Family history of other diseases of the digestive system: Secondary | ICD-10-CM | POA: Insufficient documentation

## 2018-06-12 DIAGNOSIS — K3189 Other diseases of stomach and duodenum: Secondary | ICD-10-CM | POA: Diagnosis not present

## 2018-06-12 DIAGNOSIS — G43909 Migraine, unspecified, not intractable, without status migrainosus: Secondary | ICD-10-CM | POA: Insufficient documentation

## 2018-06-12 DIAGNOSIS — Z923 Personal history of irradiation: Secondary | ICD-10-CM | POA: Insufficient documentation

## 2018-06-12 DIAGNOSIS — K2970 Gastritis, unspecified, without bleeding: Secondary | ICD-10-CM | POA: Insufficient documentation

## 2018-06-12 DIAGNOSIS — Z79899 Other long term (current) drug therapy: Secondary | ICD-10-CM | POA: Insufficient documentation

## 2018-06-12 DIAGNOSIS — Z885 Allergy status to narcotic agent status: Secondary | ICD-10-CM | POA: Diagnosis not present

## 2018-06-12 DIAGNOSIS — Z832 Family history of diseases of the blood and blood-forming organs and certain disorders involving the immune mechanism: Secondary | ICD-10-CM | POA: Diagnosis not present

## 2018-06-12 HISTORY — PX: ESOPHAGOGASTRODUODENOSCOPY (EGD) WITH PROPOFOL: SHX5813

## 2018-06-12 SURGERY — ESOPHAGOGASTRODUODENOSCOPY (EGD) WITH PROPOFOL
Anesthesia: General

## 2018-06-12 MED ORDER — FENTANYL CITRATE (PF) 100 MCG/2ML IJ SOLN
INTRAMUSCULAR | Status: AC
  Filled 2018-06-12: qty 2

## 2018-06-12 MED ORDER — PROPOFOL 500 MG/50ML IV EMUL
INTRAVENOUS | Status: AC
  Filled 2018-06-12: qty 50

## 2018-06-12 MED ORDER — PROPOFOL 500 MG/50ML IV EMUL
INTRAVENOUS | Status: DC | PRN
  Administered 2018-06-12: 120 ug/kg/min via INTRAVENOUS

## 2018-06-12 MED ORDER — MIDAZOLAM HCL 2 MG/2ML IJ SOLN
INTRAMUSCULAR | Status: AC
  Filled 2018-06-12: qty 2

## 2018-06-12 MED ORDER — FENTANYL CITRATE (PF) 100 MCG/2ML IJ SOLN
INTRAMUSCULAR | Status: DC | PRN
  Administered 2018-06-12: 50 ug via INTRAVENOUS

## 2018-06-12 MED ORDER — MIDAZOLAM HCL 2 MG/2ML IJ SOLN
INTRAMUSCULAR | Status: DC | PRN
  Administered 2018-06-12: 2 mg via INTRAVENOUS

## 2018-06-12 MED ORDER — SODIUM CHLORIDE 0.9 % IV SOLN
INTRAVENOUS | Status: DC
  Administered 2018-06-12: 13:00:00 via INTRAVENOUS

## 2018-06-12 MED ORDER — LIDOCAINE HCL (CARDIAC) PF 100 MG/5ML IV SOSY
PREFILLED_SYRINGE | INTRAVENOUS | Status: DC | PRN
  Administered 2018-06-12: 30 mg via INTRAVENOUS

## 2018-06-12 NOTE — H&P (Signed)
Cephas Darby, MD [address]  [place], [place] [address]  Main: [phone]  Fax: [phone] Pager: [phone]  Primary Care Physician:  Leone Haven, MD Primary Gastroenterologist:  Dr. Cephas Darby  Pre-Procedure History & Physical: HPI:  Sarah Moran is a 37 y.o. female is here for an endoscopy.   Past Medical History:  Diagnosis Date  . Allergy   . Chronic pelvic pain in female   . Family history of breast cancer    My Risk neg 5/18  . Frequent headaches   . Gallstone   . Genetic testing of female 01/2017   My Risk/BRCA neg  . GERD (gastroesophageal reflux disease)    well controlled. Takes gingerroot when symptoms  . Hypothyroidism    s/p thyroidectomy - levothyroxine 75 mcg daily  . IBS (irritable bowel syndrome)    constipation - takes miralax daily  . Increased risk of breast cancer 01/2017   IBIS=18.8%/riskscore=21.9%  . Liver cyst 04/2018  . Migraine    takes excedrin. She has tried imitrex in the past   . Personal history of radiation therapy   . Thyroid cancer ([place]) 2007   s/p thyroidectomy    Past Surgical History:  Procedure Laterality Date  . BACK SURGERY  1997   scoliosis  . COLONOSCOPY    . HERNIA REPAIR  1990   bilateral inguinal  . THYROIDECTOMY  2007   thyroid cancer  . TOTAL ABDOMINAL HYSTERECTOMY  2008   dymenorrhea    Prior to Admission medications   Medication Sig Start Date End Date Taking? Authorizing Provider  BIOTIN PO Take by mouth.   Yes [provider]  Cholecalciferol (VITAMIN D) 2000 UNITS tablet Take 2,000 Units by mouth daily.   Yes [provider]  COD LIVER OIL PO Take by mouth.   Yes [provider]  levocetirizine (XYZAL) 5 MG tablet Take 5 mg by mouth every evening.   Yes [provider]  linaclotide Rolan Lipa) 145 MCG CAPS capsule Take 1 capsule (145 mcg total) by mouth daily before breakfast. 06/11/18 07/11/18 Yes Naliya Gish, Tally Due, MD  Omega-3  Fatty Acids (FISH OIL OMEGA-3 PO) Take by mouth.   Yes [provider]  omeprazole (PRILOSEC) 20 MG capsule Take 1 capsule (20 mg total) by mouth daily. 04/12/18  Yes Arnett, Yvetta Coder, FNP  SUMAtriptan (IMITREX) 50 MG tablet TAKE ONE TABLET BY MOUTH AT ONSET OF MIGRAINE - MAY REPEAT DOSE IN 2 HOURS IF HEADACHE PERSISTS OR RECURS 05/19/18  Yes Leone Haven, MD  SYNTHROID 75 MCG tablet TAKE 1 TABLET (75 MCG TOTAL) BY MOUTH DAILY BEFORE BREAKFAST. 05/10/18  Yes Leone Haven, MD  tiZANidine (ZANAFLEX) 4 MG tablet Take 1 tablet (4 mg total) by mouth every 6 (six) hours as needed for muscle spasms. 05/08/17  Yes Cook, Jayce G, DO  vitamin C (ASCORBIC ACID) 500 MG tablet Take 500 mg by mouth daily.   Yes [provider]  polyethylene glycol (MIRALAX / GLYCOLAX) packet Take 17 g by mouth daily.    [provider]  tamsulosin (FLOMAX) 0.4 MG CAPS capsule Take 1 capsule (0.4 mg total) by mouth daily. Patient not taking: Reported on 06/04/2018 04/30/18   Jodelle Green, FNP    Allergies as of 06/05/2018 - Review Complete 06/04/2018  Allergen Reaction Noted  . Darvon [propoxyphene] Other (See Comments) 09/23/2013  . Percocet [oxycodone-acetaminophen] Hives 09/23/2013  . Vicodin [hydrocodone-acetaminophen] Hives 09/23/2013    Family History  Problem Relation Age of Onset  . Hyperlipidemia Father   . Hypertension Father   . Clotting disorder Sister   . Hepatitis B Brother   . Cancer Paternal Grandmother 59       breast cancer? with mets  . Breast cancer Paternal Grandmother 60  . Cancer Mother        Cervical Cancer  . Cancer Maternal Aunt        throat cancer  . Breast cancer Paternal Aunt        42  . Breast cancer Paternal Aunt        81  . Breast cancer Paternal Aunt        64    Social History   Socioeconomic History  . Marital status: Married    Spouse name: Audelia Acton  . Number of children: 2  . Years of education: 30  . Highest education level:  Not on file  Occupational History  . Occupation: Stay at [employer]  . Financial resource strain: Not on file  . Food insecurity:    Worry: Not on file    Inability: Not on file  . Transportation needs:    Medical: Not on file    Non-medical: Not on file  Tobacco Use  . Smoking status: Never Smoker  . Smokeless tobacco: Never Used  Substance and Sexual Activity  . Alcohol use: Yes    Comment: Occasionally - very rarely  . Drug use: No  . Sexual activity: Yes    Birth control/protection: Surgical    Comment: Hysterectomy  Lifestyle  . Physical activity:    Days per week: Not on file    Minutes per session: Not on file  . Stress: Not on file  Relationships  . Social connections:    Talks on phone: Not on file    Gets together: Not on file    Attends religious service: Not on file    Active member of club or organization: Not on file    Attends meetings of clubs or organizations: Not on file    Relationship status: Not on file  . Intimate partner violence:    Fear of current or ex partner: Not on file    Emotionally abused: Not on file    Physically abused: Not on file    Forced sexual activity: Not on file  Other Topics Concern  . Not on file  Social History Narrative   Angle grew up in [place]. She currently lives in [place] with her husband, Audelia Acton, and their 2 sons ([place] age 72 and Jaci Standard age 72). 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 [place] belongs to the [employer]. She enjoys outdoor activities on her spare time.    Review of Systems: See HPI, otherwise negative ROS  Physical Exam: BP 100/60   Pulse 64   Temp 97.9 F (36.6 C) (Oral)   Resp 18   SpO2 100%  General:   Alert,  pleasant and cooperative in NAD Head:  Normocephalic and atraumatic. Neck:  Supple; no masses or thyromegaly. Lungs:  Clear throughout to auscultation.    Heart:  Regular rate and rhythm. Abdomen:   Soft, nontender and nondistended. Normal bowel sounds, without guarding, and without rebound.   Neurologic:  Alert and  oriented x4;  grossly normal neurologically.  Impression/Plan: Sarah Moran is here for an endoscopy to be performed for dyspepsia  Risks, benefits, limitations, and  alternatives regarding  endoscopy have been reviewed with the patient.  Questions have been answered.  All parties agreeable.   Sherri Sear, MD  06/12/2018, 1:08 PM

## 2018-06-12 NOTE — Transfer of Care (Signed)
Immediate Anesthesia Transfer of Care Note  Patient: Sarah Moran  Procedure(s) Performed: ESOPHAGOGASTRODUODENOSCOPY (EGD) WITH PROPOFOL (N/A )  Patient Location: PACU  Anesthesia Type:General  Level of Consciousness: awake and sedated  Airway & Oxygen Therapy: Patient Spontanous Breathing and Patient connected to nasal cannula oxygen  Post-op Assessment: Report given to RN and Post -op Vital signs reviewed and stable  Post vital signs: Reviewed and stable  Last Vitals:  Vitals Value Taken Time  BP    Temp    Pulse    Resp    SpO2      Last Pain:  Vitals:   06/12/18 1243  TempSrc: Oral         Complications: No apparent anesthesia complications

## 2018-06-12 NOTE — Anesthesia Preprocedure Evaluation (Addendum)
Anesthesia Evaluation  Patient identified by MRN, date of birth, ID band Patient awake    Reviewed: Allergy & Precautions, H&P , NPO status , Patient's Chart, lab work & pertinent test results  Airway Mallampati: I  TM Distance: >3 FB Neck ROM: full    Dental  (+) Teeth Intact   Pulmonary neg pulmonary ROS,    breath sounds clear to auscultation       Cardiovascular negative cardio ROS   Rhythm:regular Rate:Normal     Neuro/Psych  Headaches, Anxiety negative neurological ROS  negative psych ROS   GI/Hepatic Neg liver ROS, GERD  ,  Endo/Other  negative endocrine ROSHypothyroidism   Renal/GU negative Renal ROS  negative genitourinary   Musculoskeletal   Abdominal   Peds  Hematology negative hematology ROS (+)   Anesthesia Other Findings Past Medical History: No date: Allergy No date: Chronic pelvic pain in female No date: Family history of breast cancer     Comment:  My Risk neg 5/18 No date: Frequent headaches No date: Gallstone 01/2017: Genetic testing of female     Comment:  My Risk/BRCA neg No date: GERD (gastroesophageal reflux disease)     Comment:  well controlled. Takes gingerroot when symptoms No date: Hypothyroidism     Comment:  s/p thyroidectomy - levothyroxine 75 mcg daily No date: IBS (irritable bowel syndrome)     Comment:  constipation - takes miralax daily 01/2017: Increased risk of breast cancer     Comment:  IBIS=18.8%/riskscore=21.9% 04/2018: Liver cyst No date: Migraine     Comment:  takes excedrin. She has tried imitrex in the past  No date: Personal history of radiation therapy 2007: Thyroid cancer [facility])     Comment:  s/p thyroidectomy  Past Surgical History: 1997: BACK SURGERY     Comment:  scoliosis No date: COLONOSCOPY 1990: HERNIA REPAIR     Comment:  bilateral inguinal 2007: THYROIDECTOMY     Comment:  thyroid cancer 2008: TOTAL ABDOMINAL HYSTERECTOMY     Comment:   dymenorrhea     Reproductive/Obstetrics negative OB ROS                            Anesthesia Physical Anesthesia Plan  ASA: II  Anesthesia Plan: General   Post-op Pain Management:    Induction:   PONV Risk Score and Plan: Propofol infusion and TIVA  Airway Management Planned: Natural Airway and Nasal Cannula  Additional Equipment:   Intra-op Plan:   Post-operative Plan:   Informed Consent: I have reviewed the patients History and Physical, chart, labs and discussed the procedure including the risks, benefits and alternatives for the proposed anesthesia with the patient or authorized representative who has indicated his/her understanding and acceptance.   Dental Advisory Given  Plan Discussed with: Anesthesiologist, CRNA and Surgeon  Anesthesia Plan Comments:         Anesthesia Quick Evaluation

## 2018-06-12 NOTE — Anesthesia Procedure Notes (Signed)
Performed by: Cook-Martin, Subrina Vecchiarelli Pre-anesthesia Checklist: Patient identified, Emergency Drugs available, Suction available, Patient being monitored and Timeout performed Patient Re-evaluated:Patient Re-evaluated prior to induction Oxygen Delivery Method: Nasal cannula Preoxygenation: Pre-oxygenation with 100% oxygen Induction Type: IV induction Airway Equipment and Method: Bite block Placement Confirmation: CO2 detector and positive ETCO2       

## 2018-06-12 NOTE — Anesthesia Post-op Follow-up Note (Signed)
Anesthesia QCDR form completed.        

## 2018-06-12 NOTE — Op Note (Signed)
[facility] Gastroenterology Patient Name: Sarah Moran Procedure Date: 06/12/2018 1:05 PM MRN: [id number] Account #: [id number] Date of Birth: 02-Sep-1981 Admit Type: Outpatient Age: 37 Room: [facility] ENDO ROOM 4 Gender: Female Note Status: Finalized Procedure:            Upper GI endoscopy Indications:          Dyspepsia Providers:            Lin Landsman MD, MD Referring MD:         Angela Adam. Caryl Bis (Referring MD) Medicines:            Monitored Anesthesia Care Complications:        No immediate complications. Estimated blood loss: None. Procedure:            Pre-Anesthesia Assessment:                       - Prior to the procedure, a History and Physical was                        performed, and patient medications and allergies were                        reviewed. The patient is competent. The risks and                        benefits of the procedure and the sedation options and                        risks were discussed with the patient. All questions                        were answered and informed consent was obtained.                        Patient identification and proposed procedure were                        verified by the physician, the nurse, the                        anesthesiologist, the anesthetist and the technician in                        the pre-procedure area in the procedure room in the                        endoscopy suite. Mental Status Examination: alert and                        oriented. Airway Examination: normal oropharyngeal                        airway and neck mobility. Respiratory Examination:                        clear to auscultation. CV Examination: normal.                        Prophylactic Antibiotics: The patient does not require  prophylactic antibiotics. Prior Anticoagulants: The                        patient has taken no previous anticoagulant or   antiplatelet agents. ASA Grade Assessment: II - A                        patient with mild systemic disease. After reviewing the                        risks and benefits, the patient was deemed in                        satisfactory condition to undergo the procedure. The                        anesthesia plan was to use monitored anesthesia care                        (MAC). Immediately prior to administration of                        medications, the patient was re-assessed for adequacy                        to receive sedatives. The heart rate, respiratory rate,                        oxygen saturations, blood pressure, adequacy of                        pulmonary ventilation, and response to care were                        monitored throughout the procedure. The physical status                        of the patient was re-assessed after the procedure.                       After obtaining informed consent, the endoscope was                        passed under direct vision. Throughout the procedure,                        the patient's blood pressure, pulse, and oxygen                        saturations were monitored continuously. The Endoscope                        was introduced through the mouth, and advanced to the                        second part of duodenum. The upper GI endoscopy was                        accomplished without difficulty. The patient tolerated  the procedure well. Findings:      The duodenal bulb and second portion of the duodenum were normal.       Biopsies for histology were taken with a cold forceps for evaluation of       celiac disease.      Diffuse mildly erythematous mucosa without bleeding was found in the       gastric body. Biopsies were taken with a cold forceps for Helicobacter       pylori testing.      The cardia, gastric fundus, gastric antrum, prepyloric region of the       stomach and pylorus were normal. Biopsies  were taken with a cold forceps       for Helicobacter pylori testing.      The gastroesophageal junction and examined esophagus were normal.      Esophagogastric landmarks were identified: the gastroesophageal junction       was found at 35 cm from the incisors. Impression:           - Normal duodenal bulb and second portion of the                        duodenum. Biopsied.                       - Erythematous mucosa in the gastric body. Biopsied.                       - Normal cardia, gastric fundus, antrum, prepyloric                        region of the stomach and pylorus. Biopsied.                       - Normal gastroesophageal junction and esophagus.                       - Esophagogastric landmarks identified. Recommendation:       - Await pathology results.                       - Discharge patient to home (with spouse).                       - Resume previous diet today.                       - Continue present medications.                       - Return to my office as previously scheduled. Procedure Code(s):    --- Professional ---                       [phone], Esophagogastroduodenoscopy, flexible, transoral;                        with biopsy, single or multiple Diagnosis Code(s):    --- Professional ---                       K31.89, Other diseases of stomach and duodenum  R10.13, Epigastric pain CPT copyright 2017 American Medical Association. All rights reserved. The codes documented in this report are preliminary and upon coder review may  be revised to meet current compliance requirements. Dr. Ulyess Mort Lin Landsman MD, MD 06/12/2018 1:21:14 PM This report has been signed electronically. Number of Addenda: 0 Note Initiated On: 06/12/2018 1:05 PM      [facility]

## 2018-06-13 LAB — SURGICAL PATHOLOGY

## 2018-06-14 ENCOUNTER — Other Ambulatory Visit: Payer: Self-pay

## 2018-06-14 ENCOUNTER — Encounter: Payer: Self-pay | Admitting: Gastroenterology

## 2018-06-14 DIAGNOSIS — A048 Other specified bacterial intestinal infections: Secondary | ICD-10-CM

## 2018-06-14 MED ORDER — CLARITHROMYCIN 250 MG PO TABS: 250.0000 mg | ORAL_TABLET | Freq: Two times a day (BID) | ORAL | 0 refills | Status: AC

## 2018-06-14 MED ORDER — OMEPRAZOLE 40 MG PO CPDR: 40.0000 mg | DELAYED_RELEASE_CAPSULE | Freq: Two times a day (BID) | ORAL | 0 refills | Status: DC

## 2018-06-14 MED ORDER — AMOXICILLIN 500 MG PO TABS: 500.0000 mg | ORAL_TABLET | Freq: Two times a day (BID) | ORAL | 0 refills | Status: AC

## 2018-06-14 NOTE — Anesthesia Postprocedure Evaluation (Signed)
Anesthesia Post Note  Patient: Writer  Procedure(s) Performed: ESOPHAGOGASTRODUODENOSCOPY (EGD) WITH PROPOFOL (N/A )  Patient location during evaluation: PACU Anesthesia Type: General Level of consciousness: awake and alert Pain management: pain level controlled Vital Signs Assessment: post-procedure vital signs reviewed and stable Respiratory status: spontaneous breathing, nonlabored ventilation and respiratory function stable Cardiovascular status: blood pressure returned to baseline and stable Postop Assessment: no apparent nausea or vomiting Anesthetic complications: no     Last Vitals:  Vitals:   06/12/18 1243 06/12/18 1323  BP: 100/60 (!) 92/58  Pulse: 64   Resp: 18 15  Temp: 36.6 C (!) 36.1 C  SpO2: 100% 100%    Last Pain:  Vitals:   06/13/18 0737  TempSrc:   PainSc: 0-No pain                 Durenda Hurt

## 2018-06-19 ENCOUNTER — Ambulatory Visit: Payer: BLUE CROSS/BLUE SHIELD | Admitting: Gastroenterology

## 2018-06-19 ENCOUNTER — Encounter: Payer: Self-pay | Admitting: Gastroenterology

## 2018-06-19 VITALS — BP 106/72 | HR 78 | Resp 17 | Ht 60.0 in | Wt 133.8 lb

## 2018-06-19 DIAGNOSIS — A048 Other specified bacterial intestinal infections: Secondary | ICD-10-CM | POA: Insufficient documentation

## 2018-06-19 DIAGNOSIS — K5909 Other constipation: Secondary | ICD-10-CM | POA: Diagnosis not present

## 2018-06-19 HISTORY — DX: Other specified bacterial intestinal infections: A04.8

## 2018-06-19 NOTE — Patient Instructions (Signed)
High-Fiber Diet  Fiber, also called dietary fiber, is a type of carbohydrate found in fruits, vegetables, whole grains, and beans. A high-fiber diet can have many health benefits. Your health care provider may recommend a high-fiber diet to help:  · Prevent constipation. Fiber can make your bowel movements more regular.  · Lower your cholesterol.  · Relieve hemorrhoids, uncomplicated diverticulosis, or irritable bowel syndrome.  · Prevent overeating as part of a weight-loss plan.  · Prevent heart disease, type 2 diabetes, and certain cancers.    What is my plan?  The recommended daily intake of fiber includes:  · 38 grams for men under age 50.  · 30 grams for men over age 50.  · 25 grams for women under age 50.  · 21 grams for women over age 50.    You can get the recommended daily intake of dietary fiber by eating a variety of fruits, vegetables, grains, and beans. Your health care provider may also recommend a fiber supplement if it is not possible to get enough fiber through your diet.  What do I need to know about a high-fiber diet?  · Fiber supplements have not been widely studied for their effectiveness, so it is better to get fiber through food sources.  · Always check the fiber content on the nutrition facts label of any prepackaged food. Look for foods that contain at least 5 grams of fiber per serving.  · Ask your dietitian if you have questions about specific foods that are related to your condition, especially if those foods are not listed in the following section.  · Increase your daily fiber consumption gradually. Increasing your intake of dietary fiber too quickly may cause bloating, cramping, or gas.  · Drink plenty of water. Water helps you to digest fiber.  What foods can I eat?  Grains  Whole-grain breads. Multigrain cereal. Oats and oatmeal. Brown rice. Barley. Bulgur wheat. Millet. Bran muffins. Popcorn. Rye wafer crackers.  Vegetables   Sweet potatoes. Spinach. Kale. Artichokes. Cabbage. Broccoli. Green peas. Carrots. Squash.  Fruits  Berries. Pears. Apples. Oranges. Avocados. Prunes and raisins. Dried figs.  Meats and Other Protein Sources  Navy, kidney, pinto, and soy beans. Split peas. Lentils. Nuts and seeds.  Dairy  Fiber-fortified yogurt.  Beverages  Fiber-fortified soy milk. Fiber-fortified orange juice.  Other  Fiber bars.  The items listed above may not be a complete list of recommended foods or beverages. Contact your dietitian for more options.  What foods are not recommended?  Grains  White bread. Pasta made with refined flour. White rice.  Vegetables  Fried potatoes. Canned vegetables. Well-cooked vegetables.  Fruits  Fruit juice. Cooked, strained fruit.  Meats and Other Protein Sources  Fatty cuts of meat. Fried poultry or fried fish.  Dairy  Milk. Yogurt. Cream cheese. Sour cream.  Beverages  Soft drinks.  Other  Cakes and pastries. Butter and oils.  The items listed above may not be a complete list of foods and beverages to avoid. Contact your dietitian for more information.  What are some tips for including high-fiber foods in my diet?  · Eat a wide variety of high-fiber foods.  · Make sure that half of all grains consumed each day are whole grains.  · Replace breads and cereals made from refined flour or white flour with whole-grain breads and cereals.  · Replace white rice with brown rice, bulgur wheat, or millet.  · Start the day with a breakfast that is high in fiber,   such as a cereal that contains at least 5 grams of fiber per serving.  · Use beans in place of meat in soups, salads, or pasta.  · Eat high-fiber snacks, such as berries, raw vegetables, nuts, or popcorn.  This information is not intended to replace advice given to you by your health care provider. Make sure you discuss any questions you have with your health care provider.  Document Released: 09/05/2005 Document Revised: 02/11/2016 Document Reviewed: 02/18/2014   Elsevier Interactive Patient Education © 2018 [employer]

## 2018-06-19 NOTE — Progress Notes (Signed)
Cephas Darby, MD [address]  [place]  [place], [place] [address]  Main: [phone]  Fax: [phone]    Gastroenterology Consultation  Referring Provider:     Leone Haven, MD Primary Care Physician:  Leone Haven, MD Primary Gastroenterologist:  Dr. Cephas Darby Reason for Consultation:     Dyspepsia and chronic constipation        HPI:   Sarah Moran is a 37 y.o. female referred by Dr. Caryl Bis, Angela Adam, MD  for consultation & management of dyspepsia and chronic constipation.  Chronic constipation: suffering from infrequent bowel movements since the delivery of her child, which is more than 10 years. She manages to incorporate more fiber in her diet, takes MiraLAX daily. Fleet enemas as needed. She reports having 1-2 bowel movements daily but associated with incomplete emptying.   Dyspepsia: for the last 6 months, patient has been experiencing right upper quadrant/epigastric discomfort associated with bloating, early satiety. She underwent right upper quadrant Ultrasound which revealed cholelithiasis. Her LFTs are normal. She is started on omeprazole 20 mg once a day. Patient says that she is generally stressful and anxious person, reports having tension knots. She was recommended to see a therapist by her PCP. She has been delaying to see the therapist because she preferred not to   Follow-up visit 06/19/2018 She underwent EGD which revealed H. pylori gastritis, started her on triple therapy.  She tried linaclotide 145 MCG which did not help, currently she is taking 290 MCG.  She continues to have ongoing symptoms of severe constipation.  She also notices bulging of the vagina particularly when she has hard stools associated with significant straining.  She said that she had to push it back after the bowel movement.  She had 2 days of hard bowel movements and she took Fleet enemas which finally helped.  She is on shakes only today, her stools are soft  now.  She says she is drinking more water, coffee in the morning and trying to add more fiber in her diet  NSAIDs: none  Antiplts/Anticoagulants/Anti thrombotics: none  GI Procedures: colonoscopy approximately 10 years ago  Reportedly normal  Past Medical History:  Diagnosis Date  . Allergy   . Chronic pelvic pain in female   . Family history of breast cancer    My Risk neg 5/18  . Frequent headaches   . Gallstone   . Genetic testing of female 01/2017   My Risk/BRCA neg  . GERD (gastroesophageal reflux disease)    well controlled. Takes gingerroot when symptoms  . Hypothyroidism    s/p thyroidectomy - levothyroxine 75 mcg daily  . IBS (irritable bowel syndrome)    constipation - takes miralax daily  . Increased risk of breast cancer 01/2017   IBIS=18.8%/riskscore=21.9%  . Liver cyst 04/2018  . Migraine    takes excedrin. She has tried imitrex in the past   . Personal history of radiation therapy   . Thyroid cancer ([place]) 2007   s/p thyroidectomy    Past Surgical History:  Procedure Laterality Date  . BACK SURGERY  1997   scoliosis  . COLONOSCOPY    . ESOPHAGOGASTRODUODENOSCOPY (EGD) WITH PROPOFOL N/A 06/12/2018   Procedure: ESOPHAGOGASTRODUODENOSCOPY (EGD) WITH PROPOFOL;  Surgeon: Lin Landsman, MD;  Location: [facility];  Service: Gastroenterology;  Laterality: N/A;  . HERNIA REPAIR  1990   bilateral inguinal  . THYROIDECTOMY  2007   thyroid cancer  . TOTAL ABDOMINAL HYSTERECTOMY  2008  dymenorrhea    Current Outpatient Medications:  .  amoxicillin (AMOXIL) 500 MG tablet, Take 1 tablet (500 mg total) by mouth 2 (two) times daily for 14 days., Disp: 28 tablet, Rfl: 0 .  Cholecalciferol (VITAMIN D) 2000 UNITS tablet, Take 2,000 Units by mouth daily., Disp: , Rfl:  .  clarithromycin (BIAXIN) 250 MG tablet, Take 1 tablet (250 mg total) by mouth 2 (two) times daily for 14 days., Disp: 28 tablet, Rfl: 0 .  COD LIVER OIL PO, Take by mouth., Disp: , Rfl:  .   levocetirizine (XYZAL) 5 MG tablet, Take 5 mg by mouth every evening., Disp: , Rfl:  .  Omega-3 Fatty Acids (FISH OIL OMEGA-3 PO), Take by mouth., Disp: , Rfl:  .  omeprazole (PRILOSEC) 40 MG capsule, Take 1 capsule (40 mg total) by mouth 2 (two) times daily for 14 days., Disp: 28 capsule, Rfl: 0 .  polyethylene glycol (MIRALAX / GLYCOLAX) packet, Take 17 g by mouth daily., Disp: , Rfl:  .  SUMAtriptan (IMITREX) 50 MG tablet, TAKE ONE TABLET BY MOUTH AT ONSET OF MIGRAINE - MAY REPEAT DOSE IN 2 HOURS IF HEADACHE PERSISTS OR RECURS, Disp: 10 tablet, Rfl: 11 .  SYNTHROID 75 MCG tablet, TAKE 1 TABLET (75 MCG TOTAL) BY MOUTH DAILY BEFORE BREAKFAST., Disp: 90 tablet, Rfl: 1 .  tamsulosin (FLOMAX) 0.4 MG CAPS capsule, Take 1 capsule (0.4 mg total) by mouth daily., Disp: 30 capsule, Rfl: 0 .  tiZANidine (ZANAFLEX) 4 MG tablet, Take 1 tablet (4 mg total) by mouth every 6 (six) hours as needed for muscle spasms., Disp: 30 tablet, Rfl: 0 .  vitamin C (ASCORBIC ACID) 500 MG tablet, Take 500 mg by mouth daily., Disp: , Rfl:  .  BIOTIN PO, Take by mouth., Disp: , Rfl:  .  linaclotide (LINZESS) 145 MCG CAPS capsule, Take 1 capsule (145 mcg total) by mouth daily before breakfast. (Patient not taking: Reported on 06/19/2018), Disp: 30 capsule, Rfl: 0 .  omeprazole (PRILOSEC) 20 MG capsule, Take 1 capsule (20 mg total) by mouth daily. (Patient not taking: Reported on 06/19/2018), Disp: 30 capsule, Rfl: 3   Family History  Problem Relation Age of Onset  . Hyperlipidemia Father   . Hypertension Father   . Clotting disorder Sister   . Hepatitis B Brother   . Cancer Paternal Grandmother 44       breast cancer? with mets  . Breast cancer Paternal Grandmother 60  . Cancer Mother        Cervical Cancer  . Cancer Maternal Aunt        throat cancer  . Breast cancer Paternal Aunt        43  . Breast cancer Paternal Aunt        28  . Breast cancer Paternal Aunt        11     Social History   Tobacco Use  .  Smoking status: Never Smoker  . Smokeless tobacco: Never Used  Substance Use Topics  . Alcohol use: Yes    Comment: Occasionally - very rarely  . Drug use: No    Allergies as of 06/19/2018 - Review Complete 06/19/2018  Allergen Reaction Noted  . Darvon [propoxyphene] Other (See Comments) 09/23/2013  . Percocet [oxycodone-acetaminophen] Hives 09/23/2013  . Vicodin [hydrocodone-acetaminophen] Hives 09/23/2013    Review of Systems:    All systems reviewed and negative except where noted in HPI.   Physical Exam:  BP 106/72 (BP Location: Left Arm,  Patient Position: Sitting, Cuff Size: Normal)   Pulse 78   Resp 17   Ht 5' (1.524 m)   Wt 133 lb 12.8 oz (60.7 kg)   BMI 26.13 kg/m  No LMP recorded. Patient has had a hysterectomy.  General:   Alert,  Well-developed, well-nourished, pleasant and cooperative in NAD Head:  Normocephalic and atraumatic. Eyes:  Sclera clear, no icterus.   Conjunctiva pink. Ears:  Normal auditory acuity. Nose:  No deformity, discharge, or lesions. Mouth:  No deformity or lesions,oropharynx pink & moist. Neck:  Supple; no masses or thyromegaly. Lungs:  Respirations even and unlabored.  Clear throughout to auscultation.   No wheezes, crackles, or rhonchi. No acute distress. Heart:  Regular rate and rhythm; no murmurs, clicks, rubs, or gallops. Abdomen:  Normal bowel sounds. Soft, non-tender and non-distended without masses, hepatosplenomegaly or hernias noted.  No guarding or rebound tenderness.   Rectal: Digital rectal exam revealed soft brown stool in the rectal vault, skin tags only, when I asked her to bear down, there was mild prolapse of the vagina which spontaneously reduced Msk:  Symmetrical without gross deformities. Good, equal movement & strength bilaterally. Pulses:  Normal pulses noted. Extremities:  No clubbing or edema.  No cyanosis. Neurologic:  Alert and oriented x3;  grossly normal neurologically. Skin:  Intact without significant lesions  or rashes. No jaundice. Psych:  Alert and cooperative. Normal mood and affect.  Imaging Studies: reviewed  Assessment and Plan:   Sarah Moran is a 37 y.o. Caucasian female with chronic constipation,six-month history of dyspepsia, and history of anxiety   Chronic constipation: high-fiber diet, education material provided Start fiber supplement such as Benefiber twice daily Adequate fluid intake Continue linaclotide to 90 MCG daily  Chronic dyspepsia: Secondary to H. pylori infection based on gastric biopsies Ultrasound revealed cholelithiasis only Currently on triple therapy Will perform H. pylori breath test or H. pylori stool antigen test after completion of treatment    Follow up in 4 weeks   Cephas Darby, MD

## 2018-06-28 ENCOUNTER — Encounter: Payer: Self-pay | Admitting: Obstetrics and Gynecology

## 2018-06-28 ENCOUNTER — Ambulatory Visit: Payer: BLUE CROSS/BLUE SHIELD | Admitting: Obstetrics and Gynecology

## 2018-06-28 VITALS — BP 100/58 | HR 96 | Ht 60.0 in | Wt 134.0 lb

## 2018-06-28 DIAGNOSIS — R339 Retention of urine, unspecified: Secondary | ICD-10-CM

## 2018-06-28 NOTE — Progress Notes (Signed)
Patient ID: Sarah Moran, female   DOB: [birth year], 37 y.o.   MRN: [id number]  Reason for Consult: Vaginal Prolapse (Vaginal itching, burning. Frequent urination, hard time going at times, some leakage, uncomfortable with intercourse )   Referred by Leone Haven, MD  Subjective:     HPI:  Sarah Moran is a 37 y.o. female. Vaginal itching, burning. Frequent urination, hard time going at times, some leakage, uncomfortable with intercourse   Gynecological History  No LMP recorded. Patient has had a hysterectomy.   History of fibroids, polyps, or ovarian cysts? : no  History of PCOS? no Hstory of Endometriosis? no History of abnormal pap smears? no Have you had any sexually transmitted infections in the past? no    Past Medical History:  Diagnosis Date  . Allergy   . Anal fissure   . Anxiety    no meds  . Chronic pelvic pain in female   . Family history of breast cancer    My Risk neg 5/18  . Family history of cervical cancer   . Family history of skin cancer   . Family history of throat cancer   . Frequent headaches   . Gallstone   . Gallstones   . Genetic testing of female 01/2017   My Risk/BRCA neg  . GERD (gastroesophageal reflux disease)    well controlled. Takes gingerroot when symptoms  . Hypothyroidism    s/p thyroidectomy - levothyroxine 75 mcg daily  . IBS (irritable bowel syndrome)    constipation - takes miralax daily  . Increased risk of breast cancer 01/2017   IBIS=18.8%/riskscore=21.9%  . Liver cyst 04/2018  . Migraine    takes excedrin. She has tried imitrex in the past   . Personal history of radiation therapy   . Seizure ([place]) 1997    x 1-age 1-after surgery for scoliosis  . Thyroid cancer ([place]) 2007   s/p thyroidectomy   Family History  Problem Relation Age of Onset  . Hyperlipidemia Father   . Hypertension Father   . Clotting disorder Sister   . Hepatitis B Brother   . Breast cancer Paternal Grandmother 15       with  mets  . Cervical cancer Mother        dx. in her early 33s  . Breast cancer Paternal Aunt 21  . Throat cancer Paternal Aunt 29  . Breast cancer Paternal Aunt 52  . Breast cancer Paternal Aunt        34  . Skin cancer Paternal Aunt   . Cancer Cousin        unknown type dx. in her late 20s/early 58s  . Skin cancer Cousin        13 cancerous moles removed in her 40s   Past Surgical History:  Procedure Laterality Date  . ANAL RECTAL MANOMETRY N/A 10/12/2018   Procedure: ANO RECTAL MANOMETRY;  Surgeon: Mauri Pole, MD;  Location: [facility];  Service: Endoscopy;  Laterality: N/A;  . ANTERIOR AND POSTERIOR REPAIR WITH SACROSPINOUS FIXATION N/A 08/02/2018   Procedure: ANTERIOR AND POSTERIOR REPAIR;  Surgeon: Homero Fellers, MD;  Location: [facility];  Service: Gynecology;  Laterality: N/A;  . BACK SURGERY  1997   scoliosis  . CHOLECYSTECTOMY N/A 09/21/2018   Procedure: LAPAROSCOPIC CHOLECYSTECTOMY;  Surgeon: Fredirick Maudlin, MD;  Location: [facility];  Service: General;  Laterality: N/A;  . COLONOSCOPY    . ESOPHAGOGASTRODUODENOSCOPY (EGD) WITH PROPOFOL N/A 06/12/2018   Procedure: ESOPHAGOGASTRODUODENOSCOPY (EGD)  WITH PROPOFOL;  Surgeon: Lin Landsman, MD;  Location: [facility];  Service: Gastroenterology;  Laterality: N/A;  . HERNIA REPAIR  1990   bilateral inguinal  . PUBOVAGINAL SLING N/A 08/02/2018   Procedure: PUBO-VAGINAL SLING- TVT;  Surgeon: Homero Fellers, MD;  Location: [facility];  Service: Gynecology;  Laterality: N/A;  . THYROIDECTOMY  2007   thyroid cancer  . TOTAL ABDOMINAL HYSTERECTOMY  2008   dymenorrhea    Short Social History:  Social History   Tobacco Use  . Smoking status: Never Smoker  . Smokeless tobacco: Never Used  Substance Use Topics  . Alcohol use: Yes    Allergies  Allergen Reactions  . Darvon [Propoxyphene] Other (See Comments)    Hallucinations   . Onion Other (See Comments)    Migraines  . Percocet  [Oxycodone-Acetaminophen] Hives  . Vicodin [Hydrocodone-Acetaminophen] Hives    Current Outpatient Medications  Medication Sig Dispense Refill  . azelastine (ASTELIN) 0.1 % nasal spray Place 2 sprays into both nostrils 2 (two) times daily. Use in each nostril as directed 30 mL 12  . Cholecalciferol (VITAMIN D3) 50 MCG (2000 UT) capsule Take 2,000 Units by mouth daily.    Marland Kitchen Cod Liver Oil CAPS Take 1 capsule by mouth at bedtime.     . diclofenac Sodium (VOLTAREN) 1 % GEL Apply 2 g topically 4 (four) times daily.    Marland Kitchen escitalopram (LEXAPRO) 5 MG tablet Take 1 tablet (5 mg total) by mouth daily. 90 tablet 1  . levocetirizine (XYZAL) 5 MG tablet Take 5 mg by mouth every evening.    Marland Kitchen LINZESS 290 MCG CAPS capsule TAKE 1 CAPSULE (290 MCG TOTAL) BY MOUTH 2 (TWO) TIMES DAILY BEFORE A MEAL 180 capsule 3  . Multiple Vitamin (MULTI-VITAMIN) tablet Take by mouth.    . SUMAtriptan (IMITREX) 50 MG tablet TAKE ONE TABLET BY MOUTH AT ONSET OF MIGRAINE - MAY REPEAT DOSE IN 2 HOURS IF HEADACHE PERSISTS 10 tablet 2  . SYNTHROID 88 MCG tablet TAKE 1 TABLET BY MOUTH DAILY BEFORE BREAKFAST. 90 tablet 0   No current facility-administered medications for this visit.    REVIEW OF SYSTEMS      Objective:  Objective   Vitals:   06/28/18 1348  BP: (!) 100/58  Pulse: 96  Weight: 134 lb (60.8 kg)  Height: 5' (1.524 m)   Body mass index is 26.17 kg/m.  Physical Exam  Assessment/Plan:     37 yo Stage 2 cystocele and rectocele- stress incontinence, would benefit from a Uretral sling Will return for pessary fitting   Adrian Prows MD [facility] 06/28/18 2:53 PM

## 2018-06-29 DIAGNOSIS — R339 Retention of urine, unspecified: Secondary | ICD-10-CM | POA: Diagnosis not present

## 2018-06-30 ENCOUNTER — Other Ambulatory Visit: Payer: Self-pay | Admitting: Gastroenterology

## 2018-06-30 DIAGNOSIS — K5904 Chronic idiopathic constipation: Secondary | ICD-10-CM

## 2018-07-01 LAB — URINE CULTURE

## 2018-07-02 ENCOUNTER — Telehealth: Payer: Self-pay

## 2018-07-02 ENCOUNTER — Telehealth: Payer: Self-pay | Admitting: Gastroenterology

## 2018-07-02 MED ORDER — LINACLOTIDE 290 MCG PO CAPS: 290.0000 ug | ORAL_CAPSULE | Freq: Every day | ORAL | 0 refills | Status: DC

## 2018-07-02 NOTE — Telephone Encounter (Signed)
Pt left vm she needs refill opn rx linset 290 mcds to  [facility] she has been out since Saturday

## 2018-07-02 NOTE — Telephone Encounter (Signed)
Pt called triage stating she would like to schedule the prolapse procedure with you for the middle of November if that is possible. Please advise thank you!

## 2018-07-02 NOTE — Telephone Encounter (Signed)
Medication has been refilled and sent to pharmacy, pt has been notified  

## 2018-07-04 ENCOUNTER — Ambulatory Visit: Payer: BLUE CROSS/BLUE SHIELD | Admitting: Obstetrics and Gynecology

## 2018-07-09 ENCOUNTER — Telehealth: Payer: Self-pay | Admitting: Obstetrics and Gynecology

## 2018-07-09 NOTE — Telephone Encounter (Signed)
Patient is aware of H&P at [facility] on 07/26/18 @ 8:50am w/ Dr Gilman Schmidt, [place] phone interview to be scheduled (patient will watch for notification on MyChart), and OR on 08/02/18. Patient is aware she may receive calls from the [facility] and [facility]. Patient confirmed BCBS and no secondary insurance. BCBS information was discussed. Ext given.

## 2018-07-10 NOTE — Telephone Encounter (Signed)
Called and discussed with patient on 07/09/18. She would like to proceed with the procedure as planned. 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

## 2018-07-17 ENCOUNTER — Encounter: Payer: Self-pay | Admitting: Gastroenterology

## 2018-07-23 ENCOUNTER — Encounter: Payer: Self-pay | Admitting: Gastroenterology

## 2018-07-23 ENCOUNTER — Ambulatory Visit: Payer: BLUE CROSS/BLUE SHIELD | Admitting: Gastroenterology

## 2018-07-26 ENCOUNTER — Encounter: Payer: Self-pay | Admitting: Obstetrics and Gynecology

## 2018-07-26 ENCOUNTER — Ambulatory Visit (INDEPENDENT_AMBULATORY_CARE_PROVIDER_SITE_OTHER): Payer: BLUE CROSS/BLUE SHIELD | Admitting: Obstetrics and Gynecology

## 2018-07-26 VITALS — BP 110/70 | HR 83 | Ht 60.0 in | Wt 137.0 lb

## 2018-07-26 DIAGNOSIS — N816 Rectocele: Secondary | ICD-10-CM

## 2018-07-26 DIAGNOSIS — N393 Stress incontinence (female) (male): Secondary | ICD-10-CM | POA: Diagnosis not present

## 2018-07-26 DIAGNOSIS — N8110 Cystocele, unspecified: Secondary | ICD-10-CM | POA: Diagnosis not present

## 2018-07-26 NOTE — H&P (View-Only) (Signed)
Patient ID: Sarah Moran, female   DOB: 1981/08/23, 37 y.o.   MRN: [id number]  Reason for Consult: Follow-up (H&P)   Referred by Leone Haven, MD  Subjective:     HPI:  Sarah Moran is a 37 y.o. female . She is here today for a preoperative visit. She has a stage 3 rectocele and cystocele as well as stress incontinence. She has been struggling with constipation. She has to splint to have a bowel movement despite being on several medicine to help her have regular bowel movements. She has a history significant for obstetrical trauma with the delivery of her two children. She also reports stress incontinence with cough, laugh, sneeze. She has had a prior hysterectomy. She was counseled about her options for management of prolapse and has elected to proceed with a anterior and posterior repair with a retropubic TVT urethral sling.  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 She is okay with cell saver and albumin.    Past Medical History:  Diagnosis Date  . Allergy   . Anxiety    no meds  . Chronic pelvic pain in female   . Family history of breast cancer    My Risk neg 5/18  . Frequent headaches   . Gallstone   . Gallstones   . Genetic testing of female 01/2017   My Risk/BRCA neg  . GERD (gastroesophageal reflux disease)    well controlled. Takes gingerroot when symptoms  . Hypothyroidism    s/p thyroidectomy - levothyroxine 75 mcg daily  . IBS (irritable bowel syndrome)    constipation - takes miralax daily  . Increased risk of breast cancer 01/2017   IBIS=18.8%/riskscore=21.9%  . Liver cyst 04/2018  . Migraine    takes excedrin. She has tried imitrex in the past   . Personal history of radiation therapy   . Seizure ([place]) 1997    x 1-age 48-after surgery for scoliosis  . Thyroid cancer ([place]) 2007   s/p thyroidectomy   Family History  Problem Relation Age of Onset  . Hyperlipidemia Father   . Hypertension Father   . Clotting  disorder Sister   . Hepatitis B Brother   . Cancer Paternal Grandmother 2       breast cancer? with mets  . Breast cancer Paternal Grandmother 89  . Cancer Mother        Cervical Cancer  . Cancer Maternal Aunt        throat cancer  . Breast cancer Paternal Aunt        67  . Breast cancer Paternal Aunt        60  . Breast cancer Paternal Aunt        42   Past Surgical History:  Procedure Laterality Date  . BACK SURGERY  1997   scoliosis  . COLONOSCOPY    . ESOPHAGOGASTRODUODENOSCOPY (EGD) WITH PROPOFOL N/A 06/12/2018   Procedure: ESOPHAGOGASTRODUODENOSCOPY (EGD) WITH PROPOFOL;  Surgeon: Lin Landsman, MD;  Location: [facility];  Service: Gastroenterology;  Laterality: N/A;  . HERNIA REPAIR  1990   bilateral inguinal  . THYROIDECTOMY  2007   thyroid cancer  . TOTAL ABDOMINAL HYSTERECTOMY  2008   dymenorrhea    Short Social History:  Social History   Tobacco Use  . Smoking status: Never Smoker  . Smokeless tobacco: Never Used  Substance Use Topics  . Alcohol use: Yes    Comment: Occasionally - very rarely  Allergies  Allergen Reactions  . Darvon [Propoxyphene] Other (See Comments)    Hallucinations   . Onion Other (See Comments)    RAW-MIGRAINES  . Percocet [Oxycodone-Acetaminophen] Hives  . Vicodin [Hydrocodone-Acetaminophen] Hives    Current Outpatient Medications  Medication Sig Dispense Refill  . cetirizine (ZYRTEC) 10 MG tablet Take 10 mg by mouth at bedtime.     Noelle Penner FIBER SUPPLEMENT PO Take 2 tablets by mouth 3 (three) times daily.    . fluticasone (FLONASE) 50 MCG/ACT nasal spray Place 2 sprays into both nostrils daily.    . Prucalopride Succinate (MOTEGRITY) 2 MG TABS Take 1 tablet by mouth every morning.     . SUMAtriptan (IMITREX) 50 MG tablet TAKE ONE TABLET BY MOUTH AT ONSET OF MIGRAINE - MAY REPEAT DOSE IN 2 HOURS IF HEADACHE PERSISTS OR RECURS (Patient taking differently: Take 50 mg by mouth every 2 (two) hours as needed for migraine.  ) 10 tablet 11  . SYNTHROID 75 MCG tablet TAKE 1 TABLET (75 MCG TOTAL) BY MOUTH DAILY BEFORE BREAKFAST. 90 tablet 1  . tiZANidine (ZANAFLEX) 4 MG tablet Take 1 tablet (4 mg total) by mouth every 6 (six) hours as needed for muscle spasms. 30 tablet 0  . linaclotide (LINZESS) 290 MCG CAPS capsule Take 1 capsule (290 mcg total) by mouth 2 (two) times daily before a meal for 180 doses. 180 capsule 0  . LINZESS 290 MCG CAPS capsule TAKE 1 CAPSULE (290 MCG TOTAL) BY MOUTH DAILY BEFORE BREAKFAST. 30 capsule 0  . omeprazole (PRILOSEC) 40 MG capsule Take 1 capsule (40 mg total) by mouth 2 (two) times daily for 14 days. 28 capsule 0   No current facility-administered medications for this visit.    Facility-Administered Medications Ordered in Other Visits  Medication Dose Route Frequency Provider Last Rate Last Dose  . ceFAZolin (ANCEF) IVPB 2g/100 mL premix  2 g Intravenous On Call to [place], MD        Review of Systems  Constitutional: Negative for chills, fatigue, fever and unexpected weight change.  HENT: Negative for trouble swallowing.  Eyes: Negative for loss of vision.  Respiratory: Negative for cough, shortness of breath and wheezing.  Cardiovascular: Negative for chest pain, leg swelling, palpitations and syncope.  GI: Negative for abdominal pain, blood in stool, diarrhea, nausea and vomiting.  GU: Negative for difficulty urinating, dysuria, frequency and hematuria.  Musculoskeletal: Negative for back pain, leg pain and joint pain.  Skin: Negative for rash.  Neurological: Negative for dizziness, headaches, light-headedness, numbness and seizures.  Psychiatric: Negative for behavioral problem, confusion, depressed mood and sleep disturbance.         Objective:  Objective   Vitals:   07/26/18 0850  BP: 110/70  Pulse: 83  Weight: 137 lb (62.1 kg)  Height: 5' (1.524 m)   Body mass index is 26.76 kg/m.  Physical Exam  Constitutional: She is oriented to person,  place, and time. She appears well-developed and well-nourished.  HENT:  Head: Normocephalic and atraumatic.  Eyes: Pupils are equal, round, and reactive to light. EOM are normal.  Cardiovascular: Normal rate and regular rhythm.  Pulmonary/Chest: Effort normal. No respiratory distress.  Neurological: She is alert and oriented to person, place, and time.  Skin: Skin is warm and dry.  Psychiatric: She has a normal mood and affect. Her behavior is normal. Judgment and thought content normal.  Nursing note and vitals reviewed.  From 06/28/18 visit:   Pelvic floor muscle strength (  Kegel) =     4 out of 4  POP-Q exam:      Aa = 1 Ba = 1 C = x  gH = 4 pb = 3 TVL = 10  Ap = +2 BP = +2 D = 10   Summary statement of POP-Q exam:    the anterior vaginal wall is 1  Cm in front of the level of the hymen the cuff / cervix is 8 cm behind the level of the hymen,  and the posterior vaginal wall is 2 cm in front of the level of the hymen  Patient voided:  More than 100 cc  PVR = 10cc cc      Leakage of urine demonstrated with replacement of the anterior vaginal wall.       Assessment/Plan:     37 yo with stage 3 rectocele, stage 2 cystocele, and stress incontinence.  Will proceed with planned surgery. Discussed with patient the risks, benefits, and alternatives to surgery. She understands the risks of recurrence as well as possible complications of the procedure including injury to bowel, bladder and major blood vessels, risk of urinary retention, sexual dysfunction. She understands the risks of bleeding and infection. She states that she does not want a blood transfusion under any circumstance, even if it was to save her life.     More than 25 minutes were spent face to face with the patient in the room with more than 50% of the time spent providing counseling and discussing the plan of management.    Adrian Prows MD [facility] 08/02/18 8:12 AM

## 2018-07-26 NOTE — Progress Notes (Signed)
 Patient ID: Sarah Moran, female   DOB: 03/01/1981, 37 y.o.   MRN: [id number]  Reason for Consult: Follow-up (H&P)   Referred by Sonnenberg, Eric G, MD  Subjective:     HPI:  [name] is a 37 y.o. female . She is here today for a preoperative visit. She has a stage 3 rectocele and cystocele as well as stress incontinence. She has been struggling with constipation. She has to splint to have a bowel movement despite being on several medicine to help her have regular bowel movements. She has a history significant for obstetrical trauma with the delivery of her two children. She also reports stress incontinence with cough, laugh, sneeze. She has had a prior hysterectomy. She was counseled about her options for management of prolapse and has elected to proceed with a anterior and posterior repair with a retropubic TVT urethral sling.  Of note she is a Jehovah's Witness and does not want a blood transfusion. She is okay with cell saver and albumin.    Past Medical History:  Diagnosis Date  . Allergy   . Anxiety    no meds  . Chronic pelvic pain in female   . Family history of breast cancer    My Risk neg 5/18  . Frequent headaches   . Gallstone   . Gallstones   . Genetic testing of female 01/2017   My Risk/BRCA neg  . GERD (gastroesophageal reflux disease)    well controlled. Takes gingerroot when symptoms  . Hypothyroidism    s/p thyroidectomy - levothyroxine 75 mcg daily  . IBS (irritable bowel syndrome)    constipation - takes miralax daily  . Increased risk of breast cancer 01/2017   IBIS=18.8%/riskscore=21.9%  . Liver cyst 04/2018  . Migraine    takes excedrin. She has tried imitrex in the past   . Personal history of radiation therapy   . Seizure ([facility]) 1997    x 1-age 15-after surgery for scoliosis  . Thyroid cancer ([facility]) 2007   s/p thyroidectomy   Family History  Problem Relation Age of Onset  . Hyperlipidemia Father   . Hypertension Father   . Clotting  disorder Sister   . Hepatitis B Brother   . Cancer Paternal Grandmother 35       breast cancer? with mets  . Breast cancer Paternal Grandmother 35  . Cancer Mother        Cervical Cancer  . Cancer Maternal Aunt        throat cancer  . Breast cancer Paternal Aunt        50  . Breast cancer Paternal Aunt        50  . Breast cancer Paternal Aunt        50   Past Surgical History:  Procedure Laterality Date  . BACK SURGERY  1997   scoliosis  . COLONOSCOPY    . ESOPHAGOGASTRODUODENOSCOPY (EGD) WITH PROPOFOL N/A 06/12/2018   Procedure: ESOPHAGOGASTRODUODENOSCOPY (EGD) WITH PROPOFOL;  Surgeon: Vanga, Rohini Reddy, MD;  Location: [facility];  Service: Gastroenterology;  Laterality: N/A;  . HERNIA REPAIR  1990   bilateral inguinal  . THYROIDECTOMY  2007   thyroid cancer  . TOTAL ABDOMINAL HYSTERECTOMY  2008   dymenorrhea    Short Social History:  Social History   Tobacco Use  . Smoking status: Never Smoker  . Smokeless tobacco: Never Used  Substance Use Topics  . Alcohol use: Yes    Comment: Occasionally - very rarely      Allergies  Allergen Reactions  . Darvon [Propoxyphene] Other (See Comments)    Hallucinations   . Onion Other (See Comments)    RAW-MIGRAINES  . Percocet [Oxycodone-Acetaminophen] Hives  . Vicodin [Hydrocodone-Acetaminophen] Hives    Current Outpatient Medications  Medication Sig Dispense Refill  . cetirizine (ZYRTEC) 10 MG tablet Take 10 mg by mouth at bedtime.     . EQ FIBER SUPPLEMENT PO Take 2 tablets by mouth 3 (three) times daily.    . fluticasone (FLONASE) 50 MCG/ACT nasal spray Place 2 sprays into both nostrils daily.    . Prucalopride Succinate (MOTEGRITY) 2 MG TABS Take 1 tablet by mouth every morning.     . SUMAtriptan (IMITREX) 50 MG tablet TAKE ONE TABLET BY MOUTH AT ONSET OF MIGRAINE - MAY REPEAT DOSE IN 2 HOURS IF HEADACHE PERSISTS OR RECURS (Patient taking differently: Take 50 mg by mouth every 2 (two) hours as needed for migraine.  ) 10 tablet 11  . SYNTHROID 75 MCG tablet TAKE 1 TABLET (75 MCG TOTAL) BY MOUTH DAILY BEFORE BREAKFAST. 90 tablet 1  . tiZANidine (ZANAFLEX) 4 MG tablet Take 1 tablet (4 mg total) by mouth every 6 (six) hours as needed for muscle spasms. 30 tablet 0  . linaclotide (LINZESS) 290 MCG CAPS capsule Take 1 capsule (290 mcg total) by mouth 2 (two) times daily before a meal for 180 doses. 180 capsule 0  . LINZESS 290 MCG CAPS capsule TAKE 1 CAPSULE (290 MCG TOTAL) BY MOUTH DAILY BEFORE BREAKFAST. 30 capsule 0  . omeprazole (PRILOSEC) 40 MG capsule Take 1 capsule (40 mg total) by mouth 2 (two) times daily for 14 days. 28 capsule 0   No current facility-administered medications for this visit.    Facility-Administered Medications Ordered in Other Visits  Medication Dose Route Frequency Provider Last Rate Last Dose  . ceFAZolin (ANCEF) IVPB 2g/100 mL premix  2 g Intravenous On Call to OR Schuman, Christanna R, MD        Review of Systems  Constitutional: Negative for chills, fatigue, fever and unexpected weight change.  HENT: Negative for trouble swallowing.  Eyes: Negative for loss of vision.  Respiratory: Negative for cough, shortness of breath and wheezing.  Cardiovascular: Negative for chest pain, leg swelling, palpitations and syncope.  GI: Negative for abdominal pain, blood in stool, diarrhea, nausea and vomiting.  GU: Negative for difficulty urinating, dysuria, frequency and hematuria.  Musculoskeletal: Negative for back pain, leg pain and joint pain.  Skin: Negative for rash.  Neurological: Negative for dizziness, headaches, light-headedness, numbness and seizures.  Psychiatric: Negative for behavioral problem, confusion, depressed mood and sleep disturbance.         Objective:  Objective   Vitals:   07/26/18 0850  BP: 110/70  Pulse: 83  Weight: 137 lb (62.1 kg)  Height: 5' (1.524 m)   Body mass index is 26.76 kg/m.  Physical Exam  Constitutional: She is oriented to person,  place, and time. She appears well-developed and well-nourished.  HENT:  Head: Normocephalic and atraumatic.  Eyes: Pupils are equal, round, and reactive to light. EOM are normal.  Cardiovascular: Normal rate and regular rhythm.  Pulmonary/Chest: Effort normal. No respiratory distress.  Neurological: She is alert and oriented to person, place, and time.  Skin: Skin is warm and dry.  Psychiatric: She has a normal mood and affect. Her behavior is normal. Judgment and thought content normal.  Nursing note and vitals reviewed.  From 06/28/18 visit:   Pelvic floor muscle strength (  Kegel) =     4 out of 4  POP-Q exam:      Aa = 1 Ba = 1 C = x  gH = 4 pb = 3 TVL = 10  Ap = +2 BP = +2 D = 10   Summary statement of POP-Q exam:    the anterior vaginal wall is 1  Cm in front of the level of the hymen the cuff / cervix is 8 cm behind the level of the hymen,  and the posterior vaginal wall is 2 cm in front of the level of the hymen  Patient voided:  More than 100 cc  PVR = 10cc cc      Leakage of urine demonstrated with replacement of the anterior vaginal wall.       Assessment/Plan:     37 yo with stage 3 rectocele, stage 2 cystocele, and stress incontinence.  Will proceed with planned surgery. Discussed with patient the risks, benefits, and alternatives to surgery. She understands the risks of recurrence as well as possible complications of the procedure including injury to bowel, bladder and major blood vessels, risk of urinary retention, sexual dysfunction. She understands the risks of bleeding and infection. She states that she does not want a blood transfusion under any circumstance, even if it was to save her life.     More than 25 minutes were spent face to face with the patient in the room with more than 50% of the time spent providing counseling and discussing the plan of management.    Christanna Schuman MD [facility], [facility] 08/02/18 8:12 AM       

## 2018-07-27 ENCOUNTER — Other Ambulatory Visit: Payer: Self-pay

## 2018-07-27 ENCOUNTER — Encounter
Admission: RE | Admit: 2018-07-27 | Discharge: 2018-07-27 | Disposition: A | Discharge status: Home / Self Care | Payer: BLUE CROSS/BLUE SHIELD | Source: Ambulatory Visit | Attending: Obstetrics and Gynecology | Admitting: Obstetrics and Gynecology

## 2018-07-27 HISTORY — DX: Calculus of gallbladder without cholecystitis without obstruction: K80.20

## 2018-07-27 HISTORY — DX: Anxiety disorder, unspecified: F41.9

## 2018-07-27 HISTORY — DX: Unspecified convulsions: R56.9

## 2018-07-27 NOTE — Patient Instructions (Signed)
Your procedure is scheduled on: 08-02-18 THURSDAY Report to Same Day Surgery 2nd floor medical mall [facility] Entrance-take elevator on left to 2nd floor.  Check in with surgery information desk.) To find out your arrival time please call [phone] between 1PM - 3PM on 08-01-18 [facility]  Remember: Instructions that are not followed completely may result in serious medical risk, up to and including death, or upon the discretion of your surgeon and anesthesiologist your surgery may need to be rescheduled.    _x___ 1. Do not eat food after midnight the night before your procedure. NO GUM OR CANDY AFTER MIDNIGHT.  You may drink clear liquids up to 2 hours before you are scheduled to arrive at the hospital for your procedure.  Do not drink clear liquids within 2 hours of your scheduled arrival to the hospital.  Clear liquids include  --Water or Apple juice without pulp  --Clear carbohydrate beverage such as ClearFast or Gatorade  --Black Coffee or Clear Tea (No milk, no creamers, do not add anything to the coffee or Tea   ____Ensure clear carbohydrate drink on the way to the hospital for bariatric patients  ____Ensure clear carbohydrate drink 3 hours before surgery for Dr Dwyane Luo patients if physician instructed.    __x__ 2. No Alcohol for 24 hours before or after surgery.   __x__3. No Smoking or e-cigarettes for 24 prior to surgery.  Do not use any chewable tobacco products for at least 6 hour prior to surgery   ____  4. Bring all medications with you on the day of surgery if instructed.    __x__ 5. Notify your doctor if there is any change in your medical condition     (cold, fever, infections).    x___6. On the morning of surgery brush your teeth with toothpaste and water.  You may rinse your mouth with mouth wash if you wish.  Do not swallow any toothpaste or mouthwash.   Do not wear jewelry, make-up, hairpins, clips or nail polish.  Do not wear lotions, powders, or  perfumes. You may wear deodorant.  Do not shave 48 hours prior to surgery. Men may shave face and neck.  Do not bring valuables to the hospital.    [facility] is not responsible for any belongings or valuables.               Contacts, dentures or bridgework may not be worn into surgery.  Leave your suitcase in the car. After surgery it may be brought to your room.  For patients admitted to the hospital, discharge time is determined by your treatment team.  _  Patients discharged the day of surgery will not be allowed to drive home.  You will need someone to drive you home and stay with you the night of your procedure.    Please read over the following fact sheets that you were given:   [facility] Preparing for Surgery   _x___ TAKE THE FOLLOWING MEDICATION THE MORNING OF SURGERY WITH A SMALL SIP OF WATER. These include:  1. PRILOSEC   2. SYNTHROID  3.  4.  5.  6.  ____Fleets enema or Magnesium Citrate as directed.   ____ Use CHG Soap or sage wipes as directed on instruction sheet   ____ Use inhalers on the day of surgery and bring to hospital day of surgery  ____ Stop Metformin and Janumet 2 days prior to surgery.    ____ Take 1/2 of usual insulin dose the night  before surgery and none on the morning surgery.   ____ Follow recommendations from Cardiologist, Pulmonologist or PCP regarding stopping Aspirin, Coumadin, Plavix ,Eliquis, Effient, or Pradaxa, and Pletal.  X____Stop Anti-inflammatories such as Advil, Aleve, Ibuprofen, Motrin, Naproxen, Naprosyn, Goodies powders or aspirin products NOW- OK to take Tylenol    ____ Stop supplements until after surgery.     ____ Bring C-Pap to the hospital.

## 2018-07-29 ENCOUNTER — Other Ambulatory Visit: Payer: Self-pay | Admitting: Gastroenterology

## 2018-07-30 ENCOUNTER — Ambulatory Visit: Payer: BLUE CROSS/BLUE SHIELD | Admitting: Gastroenterology

## 2018-07-30 ENCOUNTER — Encounter
Admission: RE | Admit: 2018-07-30 | Discharge: 2018-07-30 | Disposition: A | Discharge status: Home / Self Care | Payer: BLUE CROSS/BLUE SHIELD | Source: Ambulatory Visit | Attending: Obstetrics and Gynecology | Admitting: Obstetrics and Gynecology

## 2018-07-30 ENCOUNTER — Encounter: Payer: Self-pay | Admitting: Gastroenterology

## 2018-07-30 VITALS — BP 113/74 | HR 82 | Resp 17 | Ht 60.0 in | Wt 132.6 lb

## 2018-07-30 DIAGNOSIS — N816 Rectocele: Secondary | ICD-10-CM | POA: Insufficient documentation

## 2018-07-30 DIAGNOSIS — N393 Stress incontinence (female) (male): Secondary | ICD-10-CM | POA: Insufficient documentation

## 2018-07-30 DIAGNOSIS — N8110 Cystocele, unspecified: Secondary | ICD-10-CM | POA: Diagnosis not present

## 2018-07-30 DIAGNOSIS — Z01818 Encounter for other preprocedural examination: Secondary | ICD-10-CM | POA: Diagnosis not present

## 2018-07-30 DIAGNOSIS — A048 Other specified bacterial intestinal infections: Secondary | ICD-10-CM | POA: Diagnosis not present

## 2018-07-30 DIAGNOSIS — K581 Irritable bowel syndrome with constipation: Secondary | ICD-10-CM | POA: Diagnosis not present

## 2018-07-30 LAB — BASIC METABOLIC PANEL
Anion gap: 10 (ref 5–15)
BUN: 8 mg/dL (ref 6–20)
CO2: 25 mmol/L (ref 22–32)
Calcium: 8.4 mg/dL — ABNORMAL LOW (ref 8.9–10.3)
Chloride: 101 mmol/L (ref 98–111)
Creatinine, Ser: 0.54 mg/dL (ref 0.44–1.00)
GFR calc Af Amer: >60 mL/min (ref >60)
GFR calc non Af Amer: >60 mL/min (ref >60)
Glucose, Bld: 116 mg/dL — ABNORMAL HIGH (ref 70–99)
Potassium: 3.8 mmol/L (ref 3.5–5.1)
Sodium: 136 mmol/L (ref 135–145)

## 2018-07-30 LAB — CBC
HCT: 36.7 % (ref 36.0–46.0)
Hemoglobin: 12.2 g/dL (ref 12.0–15.0)
MCH: 30 pg (ref 26.0–34.0)
MCHC: 33.2 g/dL (ref 30.0–36.0)
MCV: 90.4 fL (ref 80.0–100.0)
Platelets: 316 10*3/uL (ref 150–400)
RBC: 4.06 MIL/uL (ref 3.87–5.11)
RDW: 12.3 % (ref 11.5–15.5)
WBC: 9.3 10*3/uL (ref 4.0–10.5)
nRBC: 0 % (ref 0.0–0.2)

## 2018-07-30 MED ORDER — LINACLOTIDE 290 MCG PO CAPS: 290.0000 ug | ORAL_CAPSULE | Freq: Two times a day (BID) | ORAL | 0 refills | Status: DC

## 2018-07-30 NOTE — Progress Notes (Signed)
Sarah Darby, MD [address]  [place]  [place], [place] [address]  Main: [phone]  Fax: [phone]    Gastroenterology Consultation  Referring Provider:     Leone Haven, MD Primary Care Physician:  Leone Haven, MD Primary Gastroenterologist:  Dr. Cephas Moran Reason for Consultation:     Dyspepsia and chronic constipation        HPI:   Sarah Moran is a 37 y.o. female referred by Dr. Caryl Bis, Angela Adam, MD  for consultation & management of dyspepsia and chronic constipation.  Chronic constipation: suffering from infrequent bowel movements since the delivery of her child, which is more than 10 years. She manages to incorporate more fiber in her diet, takes MiraLAX daily. Fleet enemas as needed. She reports having 1-2 bowel movements daily but associated with incomplete emptying.   Dyspepsia: for the last 6 months, patient has been experiencing right upper quadrant/epigastric discomfort associated with bloating, early satiety. She underwent right upper quadrant Ultrasound which revealed cholelithiasis. Her LFTs are normal. She is started on omeprazole 20 mg once a day. Patient says that she is generally stressful and anxious person, reports having tension knots. She was recommended to see a therapist by her PCP. She has been delaying to see the therapist because she preferred not to   Follow-up visit 06/19/2018 She underwent EGD which revealed H. pylori gastritis, started her on triple therapy.  She tried linaclotide 145 MCG which did not help, currently she is taking 290 MCG.  She continues to have ongoing symptoms of severe constipation.  She also notices bulging of the vagina particularly when she has hard stools associated with significant straining.  She said that she had to push it back after the bowel movement.  She had 2 days of hard bowel movements and she took Fleet enemas which finally helped.  She is on shakes only today, her stools are soft  now.  She says she is drinking more water, coffee in the morning and trying to add more fiber in her diet  Follow-up visit 1119 Patient finished treatment for H pylori.  She reports that linaclotide 290 MCG worked temporarily.  She tried motegrity and develop severe headache after taking 1 pill and caused severe cramps.  She tried Linzess 290 MCG twice daily which helped to move her bowel.  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  Plan to undergo ureteral sling on 08/02/2018.  NSAIDs: none  Antiplts/Anticoagulants/Anti thrombotics: none  GI Procedures: colonoscopy approximately 10 years ago  Reportedly normal  Past Medical History:  Diagnosis Date  . Allergy   . Anxiety    no meds  . Chronic pelvic pain in female   . Family history of breast cancer    My Risk neg 5/18  . Frequent headaches   . Gallstone   . Gallstones   . Genetic testing of female 01/2017   My Risk/BRCA neg  . GERD (gastroesophageal reflux disease)    well controlled. Takes gingerroot when symptoms  . Hypothyroidism    s/p thyroidectomy - levothyroxine 75 mcg daily  . IBS (irritable bowel syndrome)    constipation - takes miralax daily  . Increased risk of breast cancer 01/2017   IBIS=18.8%/riskscore=21.9%  . Liver cyst 04/2018  . Migraine    takes excedrin. She has tried imitrex in the past   . Personal history of radiation therapy   . Seizure ([place])  1997    x 1-age 49-after surgery for scoliosis  . Thyroid cancer ([place]) 2007   s/p thyroidectomy    Past Surgical History:  Procedure Laterality Date  . BACK SURGERY  1997   scoliosis  . COLONOSCOPY    . ESOPHAGOGASTRODUODENOSCOPY (EGD) WITH PROPOFOL N/A 06/12/2018   Procedure: ESOPHAGOGASTRODUODENOSCOPY (EGD) WITH PROPOFOL;  Surgeon: Lin Landsman, MD;  Location: [facility];  Service: Gastroenterology;  Laterality: N/A;  . HERNIA REPAIR  1990   bilateral inguinal  .  THYROIDECTOMY  2007   thyroid cancer  . TOTAL ABDOMINAL HYSTERECTOMY  2008   dymenorrhea    Current Outpatient Medications:  .  cetirizine (ZYRTEC) 10 MG tablet, Take 10 mg by mouth at bedtime. , Disp: , Rfl:  .  EQ FIBER SUPPLEMENT PO, Take 2 tablets by mouth 3 (three) times daily., Disp: , Rfl:  .  fluticasone (FLONASE) 50 MCG/ACT nasal spray, Place 2 sprays into both nostrils daily., Disp: , Rfl:  .  SUMAtriptan (IMITREX) 50 MG tablet, TAKE ONE TABLET BY MOUTH AT ONSET OF MIGRAINE - MAY REPEAT DOSE IN 2 HOURS IF HEADACHE PERSISTS OR RECURS (Patient taking differently: Take 50 mg by mouth every 2 (two) hours as needed for migraine. ), Disp: 10 tablet, Rfl: 11 .  SYNTHROID 75 MCG tablet, TAKE 1 TABLET (75 MCG TOTAL) BY MOUTH DAILY BEFORE BREAKFAST., Disp: 90 tablet, Rfl: 1 .  tiZANidine (ZANAFLEX) 4 MG tablet, Take 1 tablet (4 mg total) by mouth every 6 (six) hours as needed for muscle spasms., Disp: 30 tablet, Rfl: 0 .  linaclotide (LINZESS) 290 MCG CAPS capsule, Take 1 capsule (290 mcg total) by mouth daily before breakfast. (Patient not taking: Reported on 07/26/2018), Disp: 30 capsule, Rfl: 0 .  linaclotide (LINZESS) 290 MCG CAPS capsule, Take 1 capsule (290 mcg total) by mouth 2 (two) times daily before a meal for 180 doses., Disp: 180 capsule, Rfl: 0 .  omeprazole (PRILOSEC) 40 MG capsule, Take 1 capsule (40 mg total) by mouth 2 (two) times daily for 14 days., Disp: 28 capsule, Rfl: 0 .  Prucalopride Succinate (MOTEGRITY) 2 MG TABS, Take 1 tablet by mouth every morning. , Disp: , Rfl:    Family History  Problem Relation Age of Onset  . Hyperlipidemia Father   . Hypertension Father   . Clotting disorder Sister   . Hepatitis B Brother   . Cancer Paternal Grandmother 69       breast cancer? with mets  . Breast cancer Paternal Grandmother 82  . Cancer Mother        Cervical Cancer  . Cancer Maternal Aunt        throat cancer  . Breast cancer Paternal Aunt        66  . Breast  cancer Paternal Aunt        48  . Breast cancer Paternal Aunt        80     Social History   Tobacco Use  . Smoking status: Never Smoker  . Smokeless tobacco: Never Used  Substance Use Topics  . Alcohol use: Yes    Comment: Occasionally - very rarely  . Drug use: No    Allergies as of 07/30/2018 - Review Complete 07/30/2018  Allergen Reaction Noted  . Darvon [propoxyphene] Other (See Comments) 09/23/2013  . Onion Other (See Comments) 07/27/2018  . Percocet [oxycodone-acetaminophen] Hives 09/23/2013  . Vicodin [hydrocodone-acetaminophen] Hives 09/23/2013    Review of Systems:    All systems  reviewed and negative except where noted in HPI.   Physical Exam:  BP 113/74 (BP Location: Left Arm, Patient Position: Sitting, Cuff Size: Normal)   Pulse 82   Resp 17   Ht 5' (1.524 m)   Wt 132 lb 9.6 oz (60.1 kg)   BMI 25.90 kg/m  No LMP recorded. Patient has had a hysterectomy.  General:   Alert,  Well-developed, well-nourished, pleasant and cooperative in NAD Head:  Normocephalic and atraumatic. Eyes:  Sclera clear, no icterus.   Conjunctiva pink. Ears:  Normal auditory acuity. Nose:  No deformity, discharge, or lesions. Mouth:  No deformity or lesions,oropharynx pink & moist. Neck:  Supple; no masses or thyromegaly. Lungs:  Respirations even and unlabored.  Clear throughout to auscultation.   No wheezes, crackles, or rhonchi. No acute distress. Heart:  Regular rate and rhythm; no murmurs, clicks, rubs, or gallops. Abdomen:  Normal bowel sounds. Soft, non-tender and non-distended without masses, hepatosplenomegaly or hernias noted.  No guarding or rebound tenderness.   Rectal: Digital rectal exam revealed soft brown stool in the rectal vault, skin tags only, when I asked her to bear down, there was mild prolapse of the vagina which spontaneously reduced Msk:  Symmetrical without gross deformities. Good, equal movement & strength bilaterally. Pulses:  Normal pulses  noted. Extremities:  No clubbing or edema.  No cyanosis. Neurologic:  Alert and oriented x3;  grossly normal neurologically. Skin:  Intact without significant lesions or rashes. No jaundice. Psych:  Alert and cooperative. Normal mood and affect.  Imaging Studies: reviewed  Assessment and Plan:   Kenasia Tanney is a 37 y.o. Caucasian female with chronic constipation,six-month history of dyspepsia, and history of anxiety   Chronic idiopathic constipation: Continue high-fiber diet with adequate fluid intake She is taking Fiberchoice and fiber bars 2-3 times daily.  Benefiber caused headache Continue linaclotide 290 MCG 1-2 times daily  Chronic dyspepsia: Secondary to H. pylori infection based on gastric biopsies Ultrasound revealed cholelithiasis only Finished triple therapy H. pylori breath test today, treat with bismuth quadruple therapy if positive  Follow up in 3 months or sooner if needed   Sarah Darby, MD

## 2018-08-01 ENCOUNTER — Ambulatory Visit: Payer: BLUE CROSS/BLUE SHIELD | Admitting: Neurology

## 2018-08-01 LAB — H. PYLORI BREATH TEST: H pylori Breath Test: POSITIVE — AB

## 2018-08-01 MED ORDER — CEFAZOLIN SODIUM-DEXTROSE 2-4 GM/100ML-% IV SOLN
2.0000 g | INTRAVENOUS | Status: AC
  Administered 2018-08-02: 2 g via INTRAVENOUS

## 2018-08-02 ENCOUNTER — Observation Stay: Payer: BLUE CROSS/BLUE SHIELD | Admitting: Certified Registered Nurse Anesthetist

## 2018-08-02 ENCOUNTER — Encounter
Admission: RE | Disposition: A | Discharge status: Home / Self Care | Payer: Self-pay | Source: Ambulatory Visit | Attending: Obstetrics and Gynecology

## 2018-08-02 ENCOUNTER — Observation Stay
Admission: RE | Admit: 2018-08-02 | Discharge: 2018-08-03 | Disposition: A | Discharge status: Home / Self Care | Payer: BLUE CROSS/BLUE SHIELD | Source: Ambulatory Visit | Attending: Obstetrics and Gynecology | Admitting: Obstetrics and Gynecology

## 2018-08-02 DIAGNOSIS — K581 Irritable bowel syndrome with constipation: Secondary | ICD-10-CM | POA: Diagnosis not present

## 2018-08-02 DIAGNOSIS — Z79890 Hormone replacement therapy (postmenopausal): Secondary | ICD-10-CM | POA: Insufficient documentation

## 2018-08-02 DIAGNOSIS — Z90710 Acquired absence of both cervix and uterus: Secondary | ICD-10-CM | POA: Diagnosis not present

## 2018-08-02 DIAGNOSIS — K219 Gastro-esophageal reflux disease without esophagitis: Secondary | ICD-10-CM | POA: Insufficient documentation

## 2018-08-02 DIAGNOSIS — Z79899 Other long term (current) drug therapy: Secondary | ICD-10-CM | POA: Diagnosis not present

## 2018-08-02 DIAGNOSIS — Z885 Allergy status to narcotic agent status: Secondary | ICD-10-CM | POA: Insufficient documentation

## 2018-08-02 DIAGNOSIS — Z7951 Long term (current) use of inhaled steroids: Secondary | ICD-10-CM | POA: Diagnosis not present

## 2018-08-02 DIAGNOSIS — G43909 Migraine, unspecified, not intractable, without status migrainosus: Secondary | ICD-10-CM | POA: Diagnosis not present

## 2018-08-02 DIAGNOSIS — N819 Female genital prolapse, unspecified: Secondary | ICD-10-CM | POA: Diagnosis present

## 2018-08-02 DIAGNOSIS — E890 Postprocedural hypothyroidism: Secondary | ICD-10-CM | POA: Insufficient documentation

## 2018-08-02 DIAGNOSIS — G8929 Other chronic pain: Secondary | ICD-10-CM | POA: Insufficient documentation

## 2018-08-02 DIAGNOSIS — N816 Rectocele: Principal | ICD-10-CM | POA: Diagnosis present

## 2018-08-02 DIAGNOSIS — Z85850 Personal history of malignant neoplasm of thyroid: Secondary | ICD-10-CM | POA: Insufficient documentation

## 2018-08-02 DIAGNOSIS — F419 Anxiety disorder, unspecified: Secondary | ICD-10-CM | POA: Diagnosis not present

## 2018-08-02 DIAGNOSIS — N8110 Cystocele, unspecified: Secondary | ICD-10-CM | POA: Diagnosis not present

## 2018-08-02 DIAGNOSIS — N393 Stress incontinence (female) (male): Secondary | ICD-10-CM | POA: Insufficient documentation

## 2018-08-02 DIAGNOSIS — Z923 Personal history of irradiation: Secondary | ICD-10-CM | POA: Diagnosis not present

## 2018-08-02 DIAGNOSIS — Z91018 Allergy to other foods: Secondary | ICD-10-CM | POA: Diagnosis not present

## 2018-08-02 DIAGNOSIS — N813 Complete uterovaginal prolapse: Secondary | ICD-10-CM | POA: Diagnosis not present

## 2018-08-02 HISTORY — PX: ANTERIOR AND POSTERIOR REPAIR WITH SACROSPINOUS FIXATION: SHX6536

## 2018-08-02 HISTORY — PX: PUBOVAGINAL SLING: SHX1035

## 2018-08-02 LAB — CBC
HCT: 32.3 % — ABNORMAL LOW (ref 36.0–46.0)
Hemoglobin: 11.1 g/dL — ABNORMAL LOW (ref 12.0–15.0)
MCH: 30.5 pg (ref 26.0–34.0)
MCHC: 34.4 g/dL (ref 30.0–36.0)
MCV: 88.7 fL (ref 80.0–100.0)
Platelets: 261 10*3/uL (ref 150–400)
RBC: 3.64 MIL/uL — ABNORMAL LOW (ref 3.87–5.11)
RDW: 12.2 % (ref 11.5–15.5)
WBC: 12 10*3/uL — ABNORMAL HIGH (ref 4.0–10.5)
nRBC: 0 % (ref 0.0–0.2)

## 2018-08-02 SURGERY — ANTERIOR AND POSTERIOR REPAIR WITH SACROSPINOUS FIXATION
Anesthesia: General

## 2018-08-02 MED ORDER — LINACLOTIDE 290 MCG PO CAPS
290.0000 ug | ORAL_CAPSULE | Freq: Every day | ORAL | Status: DC
  Administered 2018-08-03: 290 ug via ORAL
  Filled 2018-08-02: qty 1

## 2018-08-02 MED ORDER — IBUPROFEN 800 MG PO TABS
800.0000 mg | ORAL_TABLET | Freq: Three times a day (TID) | ORAL | Status: DC | PRN
  Administered 2018-08-02 – 2018-08-03 (×3): 800 mg via ORAL
  Filled 2018-08-02 (×4): qty 1

## 2018-08-02 MED ORDER — HEPARIN SODIUM (PORCINE) 5000 UNIT/ML IJ SOLN
INTRAMUSCULAR | Status: AC
  Administered 2018-08-02: 5000 [IU] via SUBCUTANEOUS
  Filled 2018-08-02: qty 1

## 2018-08-02 MED ORDER — HYDROMORPHONE HCL 1 MG/ML IJ SOLN
0.2000 mg | INTRAMUSCULAR | Status: DC | PRN
  Administered 2018-08-02 (×3): 0.6 mg via INTRAVENOUS
  Filled 2018-08-02 (×3): qty 1

## 2018-08-02 MED ORDER — VASOPRESSIN 20 UNIT/ML IV SOLN
INTRAVENOUS | Status: DC | PRN
  Administered 2018-08-02: 17 mL via INTRAMUSCULAR

## 2018-08-02 MED ORDER — SIMETHICONE 80 MG PO CHEW
80.0000 mg | CHEWABLE_TABLET | Freq: Four times a day (QID) | ORAL | Status: DC | PRN
  Administered 2018-08-03: 80 mg via ORAL
  Filled 2018-08-02: qty 1

## 2018-08-02 MED ORDER — PANTOPRAZOLE SODIUM 40 MG PO TBEC
40.0000 mg | DELAYED_RELEASE_TABLET | Freq: Every day | ORAL | Status: DC
  Administered 2018-08-02: 40 mg via ORAL
  Filled 2018-08-02: qty 1

## 2018-08-02 MED ORDER — ESTROGENS, CONJUGATED 0.625 MG/GM VA CREA
TOPICAL_CREAM | VAGINAL | Status: AC
  Filled 2018-08-02: qty 30

## 2018-08-02 MED ORDER — LORATADINE 10 MG PO TABS
10.0000 mg | ORAL_TABLET | Freq: Every day | ORAL | Status: DC
  Administered 2018-08-03: 10 mg via ORAL
  Filled 2018-08-02: qty 1

## 2018-08-02 MED ORDER — VASOPRESSIN 20 UNIT/ML IV SOLN
INTRAVENOUS | Status: AC
  Filled 2018-08-02: qty 1

## 2018-08-02 MED ORDER — PHENYLEPHRINE HCL 10 MG/ML IJ SOLN
INTRAMUSCULAR | Status: DC | PRN
  Administered 2018-08-02: 50 ug via INTRAVENOUS
  Administered 2018-08-02 (×5): 100 ug via INTRAVENOUS

## 2018-08-02 MED ORDER — LIDOCAINE HCL (CARDIAC) PF 100 MG/5ML IV SOSY
PREFILLED_SYRINGE | INTRAVENOUS | Status: DC | PRN
  Administered 2018-08-02: 50 mg via INTRAVENOUS

## 2018-08-02 MED ORDER — DEXAMETHASONE SODIUM PHOSPHATE 10 MG/ML IJ SOLN
INTRAMUSCULAR | Status: DC | PRN
  Administered 2018-08-02: 10 mg via INTRAVENOUS

## 2018-08-02 MED ORDER — FENTANYL CITRATE (PF) 100 MCG/2ML IJ SOLN
INTRAMUSCULAR | Status: AC
  Filled 2018-08-02: qty 2

## 2018-08-02 MED ORDER — LIDOCAINE HCL (PF) 1 % IJ SOLN
INTRAMUSCULAR | Status: AC
  Filled 2018-08-02: qty 30

## 2018-08-02 MED ORDER — FENTANYL CITRATE (PF) 100 MCG/2ML IJ SOLN
INTRAMUSCULAR | Status: DC | PRN
  Administered 2018-08-02 (×5): 50 ug via INTRAVENOUS

## 2018-08-02 MED ORDER — LACTATED RINGERS IV SOLN: INTRAVENOUS | Status: DC

## 2018-08-02 MED ORDER — SUMATRIPTAN SUCCINATE 50 MG PO TABS
50.0000 mg | ORAL_TABLET | ORAL | Status: DC | PRN
  Filled 2018-08-02: qty 1

## 2018-08-02 MED ORDER — CEFAZOLIN SODIUM-DEXTROSE 2-4 GM/100ML-% IV SOLN
INTRAVENOUS | Status: AC
  Filled 2018-08-02: qty 100

## 2018-08-02 MED ORDER — FLUTICASONE PROPIONATE 50 MCG/ACT NA SUSP
2.0000 | Freq: Every day | NASAL | Status: DC
  Administered 2018-08-03: 2 via NASAL
  Filled 2018-08-02: qty 16

## 2018-08-02 MED ORDER — PROMETHAZINE HCL 25 MG/ML IJ SOLN: 6.2500 mg | INTRAMUSCULAR | Status: DC | PRN

## 2018-08-02 MED ORDER — TIZANIDINE HCL 4 MG PO TABS
4.0000 mg | ORAL_TABLET | Freq: Four times a day (QID) | ORAL | Status: DC | PRN
  Administered 2018-08-03: 2 mg via ORAL
  Filled 2018-08-02 (×2): qty 1

## 2018-08-02 MED ORDER — MEPERIDINE HCL 50 MG/ML IJ SOLN: 6.2500 mg | INTRAMUSCULAR | Status: DC | PRN

## 2018-08-02 MED ORDER — CALCIUM POLYCARBOPHIL 625 MG PO TABS
625.0000 mg | ORAL_TABLET | Freq: Three times a day (TID) | ORAL | Status: DC
  Administered 2018-08-03: 625 mg via ORAL
  Filled 2018-08-02 (×4): qty 1

## 2018-08-02 MED ORDER — PROPOFOL 10 MG/ML IV BOLUS
INTRAVENOUS | Status: DC | PRN
  Administered 2018-08-02: 150 mg via INTRAVENOUS

## 2018-08-02 MED ORDER — TRAMADOL HCL 50 MG PO TABS
50.0000 mg | ORAL_TABLET | Freq: Four times a day (QID) | ORAL | Status: DC | PRN
  Administered 2018-08-03: 50 mg via ORAL
  Filled 2018-08-02: qty 1

## 2018-08-02 MED ORDER — MIDAZOLAM HCL 2 MG/2ML IJ SOLN
INTRAMUSCULAR | Status: DC | PRN
  Administered 2018-08-02: 2 mg via INTRAVENOUS

## 2018-08-02 MED ORDER — ROCURONIUM BROMIDE 100 MG/10ML IV SOLN
INTRAVENOUS | Status: DC | PRN
  Administered 2018-08-02: 50 mg via INTRAVENOUS
  Administered 2018-08-02 (×3): 10 mg via INTRAVENOUS

## 2018-08-02 MED ORDER — LEVOTHYROXINE SODIUM 75 MCG PO TABS
75.0000 ug | ORAL_TABLET | Freq: Every day | ORAL | Status: DC
  Administered 2018-08-03: 75 ug via ORAL
  Filled 2018-08-02: qty 1

## 2018-08-02 MED ORDER — LACTATED RINGERS IV SOLN
INTRAVENOUS | Status: DC
  Administered 2018-08-02: 18:00:00 via INTRAVENOUS
  Administered 2018-08-02: 1 mL via INTRAVENOUS
  Administered 2018-08-03: 06:00:00 via INTRAVENOUS

## 2018-08-02 MED ORDER — ACETAMINOPHEN 500 MG PO TABS
1000.0000 mg | ORAL_TABLET | Freq: Four times a day (QID) | ORAL | Status: DC
  Administered 2018-08-02 – 2018-08-03 (×3): 1000 mg via ORAL
  Filled 2018-08-02 (×3): qty 2

## 2018-08-02 MED ORDER — ESTROGENS, CONJUGATED 0.625 MG/GM VA CREA
TOPICAL_CREAM | VAGINAL | Status: DC | PRN
  Administered 2018-08-02 (×2): 1 via VAGINAL

## 2018-08-02 MED ORDER — HEPARIN SODIUM (PORCINE) 5000 UNIT/ML IJ SOLN
5000.0000 [IU] | INTRAMUSCULAR | Status: AC
  Administered 2018-08-02: 5000 [IU] via SUBCUTANEOUS

## 2018-08-02 MED ORDER — LACTATED RINGERS IV SOLN
INTRAVENOUS | Status: DC
  Administered 2018-08-02 (×3): via INTRAVENOUS

## 2018-08-02 MED ORDER — FENTANYL CITRATE (PF) 100 MCG/2ML IJ SOLN
25.0000 ug | INTRAMUSCULAR | Status: DC | PRN
  Administered 2018-08-02 (×2): 50 ug via INTRAVENOUS

## 2018-08-02 MED ORDER — ONDANSETRON HCL 4 MG/2ML IJ SOLN
4.0000 mg | Freq: Four times a day (QID) | INTRAMUSCULAR | Status: DC | PRN
  Administered 2018-08-02: 4 mg via INTRAVENOUS
  Filled 2018-08-02: qty 2

## 2018-08-02 MED ORDER — EPHEDRINE SULFATE 50 MG/ML IJ SOLN
INTRAMUSCULAR | Status: DC | PRN
  Administered 2018-08-02 (×2): 5 mg via INTRAVENOUS

## 2018-08-02 MED ORDER — LIDOCAINE HCL 0.5 % IJ SOLN
INTRAMUSCULAR | Status: DC | PRN
  Administered 2018-08-02: 10 mL

## 2018-08-02 MED ORDER — ONDANSETRON HCL 4 MG/2ML IJ SOLN
INTRAMUSCULAR | Status: DC | PRN
  Administered 2018-08-02: 4 mg via INTRAVENOUS

## 2018-08-02 MED ORDER — SODIUM CHLORIDE (PF) 0.9 % IJ SOLN
INTRAMUSCULAR | Status: AC
  Filled 2018-08-02: qty 100

## 2018-08-02 MED ORDER — MIDAZOLAM HCL 2 MG/2ML IJ SOLN
INTRAMUSCULAR | Status: AC
  Filled 2018-08-02: qty 2

## 2018-08-02 MED ORDER — PROPOFOL 10 MG/ML IV BOLUS
INTRAVENOUS | Status: AC
  Filled 2018-08-02: qty 20

## 2018-08-02 MED ORDER — KETOROLAC TROMETHAMINE 30 MG/ML IJ SOLN
INTRAMUSCULAR | Status: DC | PRN
  Administered 2018-08-02: 30 mg via INTRAVENOUS

## 2018-08-02 MED ORDER — ONDANSETRON HCL 4 MG PO TABS
4.0000 mg | ORAL_TABLET | Freq: Four times a day (QID) | ORAL | Status: DC | PRN
  Administered 2018-08-02: 4 mg via ORAL
  Filled 2018-08-02: qty 1

## 2018-08-02 MED ORDER — SUGAMMADEX SODIUM 200 MG/2ML IV SOLN
INTRAVENOUS | Status: DC | PRN
  Administered 2018-08-02: 120 mg via INTRAVENOUS

## 2018-08-02 MED ORDER — FENTANYL CITRATE (PF) 100 MCG/2ML IJ SOLN
INTRAMUSCULAR | Status: AC
  Administered 2018-08-02: 50 ug via INTRAVENOUS
  Filled 2018-08-02: qty 2

## 2018-08-02 MED ORDER — PRUCALOPRIDE SUCCINATE 2 MG PO TABS: 1.0000 | ORAL_TABLET | ORAL | Status: DC

## 2018-08-02 MED ORDER — ACETAMINOPHEN-CODEINE #3 300-30 MG PO TABS: 1.0000 | ORAL_TABLET | Freq: Four times a day (QID) | ORAL | Status: DC | PRN

## 2018-08-02 SURGICAL SUPPLY — 58 items
BAG URINE DRAINAGE (UROLOGICAL SUPPLIES) ×4 IMPLANT
BLADE SURG 15 STRL LF DISP TIS (BLADE) ×1 IMPLANT
BLADE SURG 15 STRL SS (BLADE) ×1
BLADE SURG SZ10 CARB STEEL (BLADE) ×2 IMPLANT
CANISTER SUCT 1200ML W/VALVE (MISCELLANEOUS) ×2 IMPLANT
CATH FOLEY 2WAY  5CC 16FR (CATHETERS) ×2
CATH URTH 16FR FL 2W BLN LF (CATHETERS) ×2 IMPLANT
COVER WAND RF STERILE (DRAPES) ×2 IMPLANT
DERMABOND ADVANCED (GAUZE/BANDAGES/DRESSINGS) ×1
DERMABOND ADVANCED .7 DNX12 (GAUZE/BANDAGES/DRESSINGS) ×1 IMPLANT
DRAPE LAPAROTOMY 100X77 ABD (DRAPES) ×2 IMPLANT
DRAPE PERI LITHO V/GYN (MISCELLANEOUS) ×2 IMPLANT
DRAPE SHEET LG 3/4 BI-LAMINATE (DRAPES) ×2 IMPLANT
DRAPE UNDER BUTTOCK W/FLU (DRAPES) ×2 IMPLANT
ELECT CAUTERY BLADE 6.4 (BLADE) ×2 IMPLANT
ELECT REM PT RETURN 9FT ADLT (ELECTROSURGICAL) ×2
ELECTRODE REM PT RTRN 9FT ADLT (ELECTROSURGICAL) ×1 IMPLANT
GAUZE 4X4 16PLY RFD (DISPOSABLE) ×6 IMPLANT
GAUZE PACK 2X3YD (MISCELLANEOUS) ×4 IMPLANT
GLOVE BIOGEL PI IND STRL 6.5 (GLOVE) ×1 IMPLANT
GLOVE BIOGEL PI INDICATOR 6.5 (GLOVE) ×1
GLOVE SURG SYN 6.5 ES PF (GLOVE) ×2 IMPLANT
GOWN STRL REUS W/ TWL LRG LVL3 (GOWN DISPOSABLE) ×3 IMPLANT
GOWN STRL REUS W/TWL LRG LVL3 (GOWN DISPOSABLE) ×3
IV LACTATED RINGERS 1000ML (IV SOLUTION) ×4 IMPLANT
KIT TURNOVER CYSTO (KITS) ×2 IMPLANT
KIT TURNOVER KIT A (KITS) ×2 IMPLANT
LABEL OR SOLS (LABEL) ×2 IMPLANT
NDL HPO THNWL 1X22GA REG BVL (NEEDLE) ×1 IMPLANT
NDL SAFETY ECLIPSE 18X1.5 (NEEDLE) ×1 IMPLANT
NEEDLE HYPO 18GX1.5 SHARP (NEEDLE) ×1
NEEDLE HYPO 22GX1.5 SAFETY (NEEDLE) ×2 IMPLANT
NEEDLE SAFETY 22GX1 (NEEDLE) ×1
NEEDLE SPNL 22GX3.5 QUINCKE BK (NEEDLE) ×2 IMPLANT
NS IRRIG 500ML POUR BTL (IV SOLUTION) ×2 IMPLANT
PACK BASIN MINOR ARMC (MISCELLANEOUS) ×2 IMPLANT
PAD OB MATERNITY 4.3X12.25 (PERSONAL CARE ITEMS) ×2 IMPLANT
PAD PREP 24X41 OB/GYN DISP (PERSONAL CARE ITEMS) ×2 IMPLANT
SET CYSTO W/LG BORE CLAMP LF (SET/KITS/TRAYS/PACK) ×2 IMPLANT
SLING TVT 1.1CW X 45CML POLY (Sling) ×2 IMPLANT
SOL PREP PVP 2OZ (MISCELLANEOUS) ×2
SOLUTION PREP PVP 2OZ (MISCELLANEOUS) ×1 IMPLANT
STRAP SAFETY 5IN WIDE (MISCELLANEOUS) ×2 IMPLANT
SURGILUBE 2OZ TUBE FLIPTOP (MISCELLANEOUS) ×2 IMPLANT
SUT ETHIBOND NAB CT1 #1 30IN (SUTURE) ×8 IMPLANT
SUT VIC AB 0 CT1 27 (SUTURE) ×1
SUT VIC AB 0 CT1 27XCR 8 STRN (SUTURE) ×1 IMPLANT
SUT VIC AB 2-0 CT1 (SUTURE) ×8 IMPLANT
SUT VIC AB 2-0 CT1 27 (SUTURE) ×1
SUT VIC AB 2-0 CT1 TAPERPNT 27 (SUTURE) ×1 IMPLANT
SUT VIC AB 3-0 SH 27 (SUTURE) ×1
SUT VIC AB 3-0 SH 27X BRD (SUTURE) ×1 IMPLANT
SUT VIC AB 4-0 SH 27 (SUTURE) ×1
SUT VIC AB 4-0 SH 27XANBCTRL (SUTURE) ×1 IMPLANT
SUT VICRYL+ 3-0 36IN CT-1 (SUTURE) ×6 IMPLANT
SYR 10ML LL (SYRINGE) ×2 IMPLANT
SYR CONTROL 10ML (SYRINGE) ×2 IMPLANT
SYRINGE IRR TOOMEY STRL 70CC (SYRINGE) IMPLANT

## 2018-08-02 NOTE — Interval H&P Note (Signed)
History and Physical Interval Note:  08/02/2018 10:58 AM  Sarah Moran  has presented today for surgery, with the diagnosis of rectocele stage 3,cystocele stage 3,stress incontinence  The various methods of treatment have been discussed with the patient and family. After consideration of risks, benefits and other options for treatment, the patient has consented to  Procedure(s): ANTERIOR AND POSTERIOR REPAIR (N/A) PUBO-VAGINAL SLING- TVT (N/A) as a surgical intervention .  The patient's history has been reviewed, patient examined, no change in status, stable for surgery.  I have reviewed the patient's chart and labs.  Questions were answered to the patient's satisfaction.     [place]

## 2018-08-02 NOTE — Transfer of Care (Signed)
Immediate Anesthesia Transfer of Care Note  Patient: Sarah Moran  Procedure(s) Performed: ANTERIOR AND POSTERIOR REPAIR (N/A ) PUBO-VAGINAL SLING- TVT (N/A )  Patient Location: PACU  Anesthesia Type:General  Level of Consciousness: sedated  Airway & Oxygen Therapy: Patient Spontanous Breathing and Patient connected to face mask oxygen  Post-op Assessment: Report given to RN and Post -op Vital signs reviewed and stable  Post vital signs: Reviewed and stable  Last Vitals:  Vitals Value Taken Time  BP 94/56 08/02/2018  4:18 PM  Temp    Pulse 119 08/02/2018  4:19 PM  Resp 23 08/02/2018  4:19 PM  SpO2 100 % 08/02/2018  4:19 PM  Vitals shown include unvalidated device data.  Last Pain:  Vitals:   08/02/18 0928  TempSrc: Oral         Complications: No apparent anesthesia complications

## 2018-08-02 NOTE — Anesthesia Preprocedure Evaluation (Signed)
Anesthesia Evaluation  Patient identified by MRN, date of birth, ID band Patient awake    Reviewed: Allergy & Precautions, NPO status , Patient's Chart, lab work & pertinent test results  History of Anesthesia Complications Negative for: history of anesthetic complications  Airway Mallampati: I  TM Distance: >3 FB Neck ROM: Full    Dental no notable dental hx.    Pulmonary neg pulmonary ROS, neg sleep apnea, neg COPD,    breath sounds clear to auscultation- rhonchi (-) wheezing      Cardiovascular Exercise Tolerance: Good (-) hypertension(-) CAD, (-) Past MI, (-) Cardiac Stents and (-) CABG  Rhythm:Regular Rate:Normal - Systolic murmurs and - Diastolic murmurs    Neuro/Psych  Headaches, Anxiety    GI/Hepatic Neg liver ROS, GERD  ,  Endo/Other  neg diabetesHypothyroidism   Renal/GU negative Renal ROS     Musculoskeletal negative musculoskeletal ROS (+)   Abdominal (+) - obese,   Peds  Hematology negative hematology ROS (+)   Anesthesia Other Findings Past Medical History: No date: Allergy No date: Anxiety     Comment:  no meds No date: Chronic pelvic pain in female No date: Family history of breast cancer     Comment:  My Risk neg 5/18 No date: Frequent headaches No date: Gallstone No date: Gallstones 01/2017: Genetic testing of female     Comment:  My Risk/BRCA neg No date: GERD (gastroesophageal reflux disease)     Comment:  well controlled. Takes gingerroot when symptoms No date: Hypothyroidism     Comment:  s/p thyroidectomy - levothyroxine 75 mcg daily No date: IBS (irritable bowel syndrome)     Comment:  constipation - takes miralax daily 01/2017: Increased risk of breast cancer     Comment:  IBIS=18.8%/riskscore=21.9% 04/2018: Liver cyst No date: Migraine     Comment:  takes excedrin. She has tried imitrex in the past  No date: Personal history of radiation therapy 1997: Seizure ([place])  Comment:   x 1-age 35-after surgery for scoliosis 2007: Thyroid cancer ([place])     Comment:  s/p thyroidectomy   Reproductive/Obstetrics                             Anesthesia Physical Anesthesia Plan  ASA: II  Anesthesia Plan: General   Post-op Pain Management:    Induction: Intravenous  PONV Risk Score and Plan: 2 and Ondansetron, Dexamethasone and Midazolam  Airway Management Planned: Oral ETT  Additional Equipment:   Intra-op Plan:   Post-operative Plan: Extubation in OR  Informed Consent: I have reviewed the patients History and Physical, chart, labs and discussed the procedure including the risks, benefits and alternatives for the proposed anesthesia with the patient or authorized representative who has indicated his/her understanding and acceptance.   Dental advisory given  Plan Discussed with: CRNA and Anesthesiologist  Anesthesia Plan Comments:         Anesthesia Quick Evaluation

## 2018-08-02 NOTE — Anesthesia Procedure Notes (Signed)
Procedure Name: Intubation Date/Time: 08/02/2018 11:24 AM Performed by: Willette Alma, CRNA Pre-anesthesia Checklist: Patient identified, Patient being monitored, Timeout performed, Emergency Drugs available and Suction available Patient Re-evaluated:Patient Re-evaluated prior to induction Oxygen Delivery Method: Circle system utilized Preoxygenation: Pre-oxygenation with 100% oxygen Induction Type: IV induction Ventilation: Mask ventilation without difficulty Laryngoscope Size: Mac and 3 Grade View: Grade I Tube type: Oral Tube size: 7.0 mm Number of attempts: 1 Airway Equipment and Method: Stylet Placement Confirmation: ETT inserted through vocal cords under direct vision,  positive ETCO2 and breath sounds checked- equal and bilateral Secured at: 21 cm Tube secured with: Tape Dental Injury: Teeth and Oropharynx as per pre-operative assessment

## 2018-08-02 NOTE — Op Note (Signed)
Operative Note:  PRE-OP DIAGNOSIS: rectocele stage 3,cystocele stage 3,stress incontinence   POST-OP DIAGNOSIS: rectocele stage 3,cystocele stage 3,stress incontinence   PROCEDURE: Procedure(s): ANTERIOR AND POSTERIOR REPAIR RETROPUBIC PUBO-VAGINAL SLING- TVT PERINEORRHAPHY   SURGEON: Adrian Prows MD  ASSISTANT: Prentice Docker MD   ANESTHESIA: General endotracheal anesthesia  ESTIMATED BLOOD LOSS: 300 cc  SPECIMENS: None.  COMPLICATIONS: Retropubic mesh released after placement.   DISPOSITION: stable to PACU  FINDINGS: Stage 3 rectocele and cystocele. Normal vaginal cuff.  PROCEDURE:   Patient was taken to the OR where she was placed in dorsal lithotomy in [place]. She was prepped and draped in the usual sterile fashion. A timeout was performed. Foley is placed into bladder. A speculum was placed inside the vagina. Above findings noted.  Posterior colporrhaphy is performed.  Allis clamps are placed along the posterior vaginal wall, lidocaine is used to infiltrate the plane, and incision is made midline vertical.  Endopelvic fascia is dissected free of vaginal mucosa.  Fascia is plicated w interrupted vicryl sutures.  Ethibond suture also used at the introitus.  Excess mucosa is excised.  Vaginal incision is closed with a running locking Vicryl suture.  Perineorrhaphy is performed on the external skin as well as to strengthen and plicate the perineal body.    Excellent hemostasis was noted at the end of the case.     The retropubic sling was then performed.  With the bladder completely empty, a midline incision was made in the anterior vaginal wall 1?cm below the urethral meatus to the level of the bladder neck. Using a combination of sharp and blunt dissection, an anterior vaginal wall flap was dissected off the underlying periurethral and pubocervical fascias. The dissection was continued laterally to the ischiopubic rami, exposing the endopelvic fascia bilaterally.  During the entire dissection, care was taken to hug the posterior aspect of the pubic symphysis to avoid injuring the bladder. The rigid catheter guide is placed in the foley with appropriate deviation of the bladder while placing the sling.    The polypropylene mesh was brought into the vaginal field and the right/left sling suture was loaded on the transvaginal needle. Using the TVT  device and material, the trocar is placed through the right then the left retropubic space and then trough an exit incision in the mons pubis.  Cystoscopy is performed with saline distension of the bladder with no perforations noted.  The TVT sling is then pulled upward until contact is made along the bladder neck, and a Kelly clamp is used to ensure it is not cinched too tightly.  Suprapubic pressure is performed to ensure no leakage of urine.  The sleeves are then removed from the sling.  Incisions are closed with Dermabond and 4-0 monocryl suture.    Anterior colporrhaphy is performed.  Allis clamps are placed along the anterior vaginal wall, lidocaine is used to infiltrate the plane, and incision was extended.  Endopelvic fascia is dissected free of vaginal mucosa.  Fascia is plicated w interrupted vicryl sutures.  Excess mucosa is excised.  Vaginal incision is closed with a running locking Vicryl suture.  Excellent hemostasis was noted at the end of the case.   After completion of the anterior colporrhapy it was noted that the retropubic mid urethral sling mesh had button-holed through the vaginal mucosa. Because of this the mesh of the sling that was visible was excised.    Packing gauze w premarin cream is placed. A Foley catheter is left in  place  inside her bladder. Clear, yellow urine was noted. All instrument needle and lap counts were correct x 2. Patient was awakened taken to recovery room in stable condition.  Adrian Prows MD [facility] 08/23/2018  12:46 AM

## 2018-08-02 NOTE — Anesthesia Post-op Follow-up Note (Signed)
Anesthesia QCDR form completed.        

## 2018-08-02 NOTE — Anesthesia Postprocedure Evaluation (Signed)
Anesthesia Post Note  Patient: Sarah Moran  Procedure(s) Performed: ANTERIOR AND POSTERIOR REPAIR (N/A ) PUBO-VAGINAL SLING- TVT (N/A )  Patient location during evaluation: PACU Anesthesia Type: General Level of consciousness: awake and alert Pain management: pain level controlled Vital Signs Assessment: post-procedure vital signs reviewed and stable Respiratory status: spontaneous breathing, nonlabored ventilation, respiratory function stable and patient connected to nasal cannula oxygen Cardiovascular status: blood pressure returned to baseline and stable Postop Assessment: no apparent nausea or vomiting Anesthetic complications: no     Last Vitals:  Vitals:   08/02/18 1633 08/02/18 1703  BP: (!) 89/63 (!) 94/59  Pulse: (!) 108 97  Resp: 15 18  Temp:  (!) 36.4 C  SpO2: 99% 95%    Last Pain:  Vitals:   08/02/18 1703  TempSrc:   PainSc: 3                  Precious Haws Woodard Perrell

## 2018-08-03 ENCOUNTER — Encounter: Payer: Self-pay | Admitting: Obstetrics and Gynecology

## 2018-08-03 DIAGNOSIS — K581 Irritable bowel syndrome with constipation: Secondary | ICD-10-CM | POA: Diagnosis not present

## 2018-08-03 DIAGNOSIS — G43909 Migraine, unspecified, not intractable, without status migrainosus: Secondary | ICD-10-CM | POA: Diagnosis not present

## 2018-08-03 DIAGNOSIS — N816 Rectocele: Secondary | ICD-10-CM | POA: Diagnosis not present

## 2018-08-03 DIAGNOSIS — F419 Anxiety disorder, unspecified: Secondary | ICD-10-CM | POA: Diagnosis not present

## 2018-08-03 DIAGNOSIS — Z885 Allergy status to narcotic agent status: Secondary | ICD-10-CM | POA: Diagnosis not present

## 2018-08-03 DIAGNOSIS — Z79890 Hormone replacement therapy (postmenopausal): Secondary | ICD-10-CM | POA: Diagnosis not present

## 2018-08-03 DIAGNOSIS — K219 Gastro-esophageal reflux disease without esophagitis: Secondary | ICD-10-CM | POA: Diagnosis not present

## 2018-08-03 DIAGNOSIS — G8929 Other chronic pain: Secondary | ICD-10-CM | POA: Diagnosis not present

## 2018-08-03 DIAGNOSIS — Z91018 Allergy to other foods: Secondary | ICD-10-CM | POA: Diagnosis not present

## 2018-08-03 DIAGNOSIS — Z90710 Acquired absence of both cervix and uterus: Secondary | ICD-10-CM | POA: Diagnosis not present

## 2018-08-03 DIAGNOSIS — Z7951 Long term (current) use of inhaled steroids: Secondary | ICD-10-CM | POA: Diagnosis not present

## 2018-08-03 DIAGNOSIS — E890 Postprocedural hypothyroidism: Secondary | ICD-10-CM | POA: Diagnosis not present

## 2018-08-03 DIAGNOSIS — Z85850 Personal history of malignant neoplasm of thyroid: Secondary | ICD-10-CM | POA: Diagnosis not present

## 2018-08-03 DIAGNOSIS — Z79899 Other long term (current) drug therapy: Secondary | ICD-10-CM | POA: Diagnosis not present

## 2018-08-03 DIAGNOSIS — N393 Stress incontinence (female) (male): Secondary | ICD-10-CM | POA: Diagnosis not present

## 2018-08-03 DIAGNOSIS — N8110 Cystocele, unspecified: Secondary | ICD-10-CM | POA: Diagnosis not present

## 2018-08-03 MED ORDER — ACETAMINOPHEN-CODEINE #3 300-30 MG PO TABS: 1.0000 | ORAL_TABLET | Freq: Four times a day (QID) | ORAL | 0 refills | Status: DC | PRN

## 2018-08-03 MED ORDER — IBUPROFEN 800 MG PO TABS: 800.0000 mg | ORAL_TABLET | Freq: Three times a day (TID) | ORAL | 0 refills | Status: DC | PRN

## 2018-08-03 MED ORDER — SIMETHICONE 80 MG PO CHEW: 80.0000 mg | CHEWABLE_TABLET | Freq: Four times a day (QID) | ORAL | 0 refills | Status: DC | PRN

## 2018-08-03 NOTE — Discharge Instructions (Signed)
Prolapse Repair, Care After Refer to this sheet in the next few weeks. These instructions provide you with information about caring for yourself after your procedure. Your health care provider may also give you more specific instructions. Your treatment has been planned according to current medical practices, but problems sometimes occur. Call your health care provider if you have any problems or questions after your procedure. What can I expect after the procedure? After the procedure, it is common to have:  Pain.  Soreness and numbness in your incision areas.  Vaginal bleeding and discharge.  Constipation.  Temporary problems emptying the bladder.  Feelings of sadness or other emotions.  Follow these instructions at home: Medicines  Take over-the-counter and prescription medicines only as told by your health care provider.  If you were prescribed an antibiotic medicine, take it as told by your health care provider. Do not stop taking the antibiotic even if you start to feel better.  Do not drive or operate heavy machinery while taking prescription pain medicine. Activity  Return to your normal activities as told by your health care provider. Ask your health care provider what activities are safe for you.  Get regular exercise as told by your health care provider. You may be told to take short walks every day and go farther each time.  Do not lift anything that is heavier than 10 lb (4.5 kg). General instructions   Do not put anything in your vagina for 6 weeks after your surgery or as told by your health care provider. This includes tampons and douches.  Do not have sex until your health care provider says you can.  Do not take baths, swim, or use a hot tub until your health care provider approves.  Drink enough fluid to keep your urine clear or pale yellow.  Do not drive for 24 hours if you were given a sedative.  Keep all follow-up visits as told by your health care  provider. This is important. Contact a health care provider if:  Your pain medicine is not helping.  You have a fever.  You have redness, swelling, or pain at your incision site.  You have blood, pus, or a bad-smelling discharge from your vagina.  You continue to have difficulty urinating. Get help right away if:  You have severe abdominal or back pain.  You have heavy bleeding from your vagina.  You have chest pain or shortness of breath. This information is not intended to replace advice given to you by your health care provider. Make sure you discuss any questions you have with your health care provider. Document Released: 12/28/2015 Document Revised: 02/11/2016 Document Reviewed: 09/20/2015 Elsevier Interactive Patient Education  [employer].

## 2018-08-03 NOTE — Progress Notes (Signed)
Patient will be discharged home with family. Discharge instructions and prescriptions given and reviewed with patient. F/u appointment 11/22. Patient verbalized understanding. Will be escorted out by auxillary.

## 2018-08-03 NOTE — Discharge Summary (Signed)
Physician Discharge Summary   Patient ID: Sarah Moran [id number] y.o. 1981-05-29  Admit date: 08/02/2018  Discharge date and time: No discharge date for patient encounter.   Admitting Physician: Homero Fellers, MD   Discharge Physician: Adrian Prows MS  Admission Diagnoses: rectocele stage 3,cystocele stage 3,stress incontinence  Discharge Diagnoses: rectocele stage 3,cystocele stage 3,stress incontinence, postoperative care following prolapse surgery  Admission Condition: good  Discharged Condition: good  Indication for Admission: Postoperative recovery  Hospital Course: Sarah Moran was admitted after undergoing surgery for prolapse and urethral incontinence. After the surgery she was feeling better   Consults: None  Significant Diagnostic Studies: none  Treatments: surgery  Discharge Exam: BP 99/61 (BP Location: Right Arm)   Pulse 64   Temp 97.9 F (36.6 C) (Oral)   Resp 16   SpO2 100%   General Appearance:    Alert, cooperative, no distress, appears stated age  Head:    Normocephalic, without obvious abnormality, atraumatic  Eyes:    PERRL, conjunctiva/corneas clear, EOM's intact, fundi    benign, both eyes  Ears:    Normal TM's and external ear canals, both ears  Nose:   Nares normal, septum midline, mucosa normal, no drainage    or sinus tenderness  Throat:   Lips, mucosa, and tongue normal; teeth and gums normal  Neck:   Supple, symmetrical, trachea midline, no adenopathy;    thyroid:  no enlargement/tenderness/nodules; no carotid   bruit or JVD  Back:     Symmetric, no curvature, ROM normal, no CVA tenderness  Lungs:     Clear to auscultation bilaterally, respirations unlabored  Chest Wall:    No tenderness or deformity   Heart:    Regular rate and rhythm, S1 and S2 normal, no murmur, rub   or gallop  Breast Exam:    No tenderness, masses, or nipple abnormality  Abdomen:     Soft, non-tender, bowel sounds active all four quadrants,    no  masses, no organomegaly  Genitalia:    Normal female without lesion, discharge or tenderness  Rectal:    Normal tone, normal prostate, no masses or tenderness;   guaiac negative stool  Extremities:   Extremities normal, atraumatic, no cyanosis or edema  Pulses:   2+ and symmetric all extremities  Skin:   Skin color, texture, turgor normal, no rashes or lesions  Lymph nodes:   Cervical, supraclavicular, and axillary nodes normal  Neurologic:   CNII-XII intact, normal strength, sensation and reflexes    throughout    Disposition: Discharge disposition: 01-Home or Self Care       Patient Instructions:  Allergies as of 08/03/2018      Reactions   Darvon [propoxyphene] Other (See Comments)   Hallucinations   Onion Other (See Comments)   RAW-MIGRAINES   Percocet [oxycodone-acetaminophen] Hives   Vicodin [hydrocodone-acetaminophen] Hives      Medication List    TAKE these medications   acetaminophen-codeine 300-30 MG tablet Commonly known as:  TYLENOL #3 Take 1 tablet by mouth every 6 (six) hours as needed for moderate pain.   cetirizine 10 MG tablet Commonly known as:  ZYRTEC Take 10 mg by mouth at bedtime.   EQ FIBER SUPPLEMENT PO Take 2 tablets by mouth 3 (three) times daily.   fluticasone 50 MCG/ACT nasal spray Commonly known as:  FLONASE Place 2 sprays into both nostrils daily.   ibuprofen 800 MG tablet Commonly known as:  ADVIL,MOTRIN Take 1 tablet (800 mg total) by mouth every  8 (eight) hours as needed (mild pain).   linaclotide 290 MCG Caps capsule Commonly known as:  LINZESS Take 1 capsule (290 mcg total) by mouth 2 (two) times daily before a meal for 180 doses.   LINZESS 290 MCG Caps capsule Generic drug:  linaclotide TAKE 1 CAPSULE (290 MCG TOTAL) BY MOUTH DAILY BEFORE BREAKFAST.   MOTEGRITY 2 MG Tabs Generic drug:  Prucalopride Succinate Take 1 tablet by mouth every morning.   omeprazole 40 MG capsule Commonly known as:  PRILOSEC Take 1 capsule  (40 mg total) by mouth 2 (two) times daily for 14 days.   simethicone 80 MG chewable tablet Commonly known as:  MYLICON Chew 1 tablet (80 mg total) by mouth 4 (four) times daily as needed for flatulence.   SUMAtriptan 50 MG tablet Commonly known as:  IMITREX TAKE ONE TABLET BY MOUTH AT ONSET OF MIGRAINE - MAY REPEAT DOSE IN 2 HOURS IF HEADACHE PERSISTS OR RECURS What changed:  See the new instructions.   SYNTHROID 75 MCG tablet Generic drug:  levothyroxine TAKE 1 TABLET (75 MCG TOTAL) BY MOUTH DAILY BEFORE BREAKFAST.   tiZANidine 4 MG tablet Commonly known as:  ZANAFLEX Take 1 tablet (4 mg total) by mouth every 6 (six) hours as needed for muscle spasms.      Activity: ambulate in house and no lifting, driving, or strenuous exercise for 12-16 weeks Diet: regular diet Wound Care: as directed  Follow-up with Dr. Gilman Schmidt in 1 week.  Signed: Homero Fellers 08/03/2018 1:43 PM

## 2018-08-03 NOTE — Progress Notes (Signed)
Dr. Gilman Schmidt notified pt walked x2 laps around nurses station with PT. Passed post-void trial. Pt feels better now that foley catheter is removed just some pressure and scant vaginal bleeding.

## 2018-08-03 NOTE — Progress Notes (Signed)
1 Day Post-Op Procedure(s) (LRB): ANTERIOR AND POSTERIOR REPAIR (N/A) PUBO-VAGINAL SLING- TVT (N/A)  Subjective: Patient reports that her pain is controlled. She is passing both flatus and having burping. She was nauseous yesterday, but she stopped taking dilaudid and has felt better since then. She has numbness and tingling in her left lateral thigh from the knee to the hip. She can lift and move her left leg. She was able to stand at bedside with assistance from me and her husband.   Objective: I have reviewed patient's vital signs, medications and labs.  General: alert, cooperative, appears stated age and no distress Resp: clear to auscultation bilaterally Cardio: regular rate and rhythm, S1, S2 normal, no murmur, click, rub or gallop GI: soft, non-tender; bowel sounds normal; no masses,  no organomegaly Extremities: extremities normal, atraumatic, no cyanosis or edema, no edema, redness or tenderness in the calves or thighs and bilateral patellar reflexes intact Vaginal Bleeding: packing rmoved. scant dark red blood on the packing  Assessment: s/p Procedure(s): ANTERIOR AND POSTERIOR REPAIR (N/A) PUBO-VAGINAL SLING- TVT (N/A): stable, progressing well and tolerating diet  Plan:  WIll consult physical therapy to clear patient for ambulation. If she is cleared by them will remove foley catheter and conduct voiding trial. Instructions given to nursing.   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 Reassurance at this time. Will continue to monitor.  No evidence at this time of femoral nerve injury. Reflexes intact.   Discussed diet advancement. She will try oatmeal this morning.  Continue home medications for constipation related to IBS.   Discharge possible this afternoon pending PT evaluation and voiding trial.   Ted hose and SCDs for DVT prophylaxis  More than 20 mintues spent at patient's bedside this morning.    LOS: 0 days    Sarah Moran  Sarah Moran 08/03/2018, 8:09 AM

## 2018-08-03 NOTE — Evaluation (Signed)
Physical Therapy Evaluation Patient Details Name: Sarah Moran MRN: [id number] DOB: 02-24-1981 Today's Date: 08/03/2018   History of Present Illness  Pt is a 37 y.o. female s/p 08/02/18 anterior and posterior repair and pubo-vaginal sling d/t stage 3 rectocele and cystocele as well as stress incontinence.  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  PMH includes HA, anxiety, IBS, seizure, thyroid CA s/p thyroidectomy, and chronic pelvic pain.  Clinical Impression  Prior to hospital admission, pt was independent.  Pt lives with her husband/family in 1 level home.  Currently pt is modified independent with bed mobility, transfers, and ambulation (2 laps) around nursing loop.  Pt demonstrating numbness/tingling L anterior/lateral (and mild posterior) thigh but L LE strength appearing WFL (no knee buckling noted and pt reporting L LE feeling stronger the more she walked).  Pain 8/10 (pt reporting burning/pressure from foley catheter) throughout session (no change in pain during session; nurse notified).  No further PT needs noted during hospital stay (pt educated to follow-up with MD for any further PT needs if N/T not improving and strength concerns noted; pt reports plan for pelvic health PT about 6 weeks after surgery).  Pt reports no further questions or concerns for PT.  Discussed pt's status with pt's nurse.  Will sign off from PT in house and complete current PT order.    Follow Up Recommendations No PT follow up(Follow-up with MD for any further PT needs (pt reports plan for pelvic health PT about 6 weeks after surgery))    Equipment Recommendations  None recommended by PT    Recommendations for Other Services       Precautions / Restrictions Precautions Precautions: Fall Restrictions Weight Bearing Restrictions: No      Mobility  Bed Mobility Overal bed mobility: Modified Independent             General bed mobility comments: pt declined laying bed flat (reports unable d/t catheter pain and may sleep in recliner at home) but able to perform semi-supine to/from sit with HOB elevated with only mild increased effort/time on own  Transfers Overall transfer level: Modified independent   Transfers: Sit to/from [facility] Sit to Stand: Modified independent (Device/Increase time) Stand pivot transfers: Modified independent (Device/Increase time)       General transfer comment: mild increased effort/time to perform on own but overall steady and safe  Ambulation/Gait Ambulation/Gait assistance: Modified independent (Device/Increase time) Gait Distance (Feet): 350 Feet Assistive device: None Gait Pattern/deviations: Step-through pattern Gait velocity: mildly decreased   General Gait Details: wider BOS (pt reports d/t foley catheter discomfort) causing increased lateral sway but overall steady  Stairs Stairs: (Deferred (pt reports no concerns with stairs at home and has strong family members that can assist as needed))          Wheelchair Mobility    Modified Rankin (Stroke Patients Only)       Balance Overall balance assessment: No apparent balance deficits (not formally assessed)                                           Pertinent Vitals/Pain Pain Assessment: 0-10 Pain Score: 8  Pain Location: pressure from catheter Pain Descriptors / Indicators: Burning;Pressure Pain Intervention(s): Limited activity within patient's tolerance;Monitored during session;Repositioned;Other (comment)(RN notified of pt's pain) Vitals (HR  and O2 on room air) stable and WFL throughout treatment session.    Home Living Family/patient expects to be discharged to:: Private residence Living Arrangements: Spouse/significant other;Children Available Help at Discharge: Family Type of Home: House Home Access: Stairs to enter Entrance  Stairs-Rails: Psychiatric nurse of Steps: 3-6 steps to enter (depending on entrance) Home Layout: One level Home Equipment: None      Prior Function Level of Independence: Independent         Comments: Pt reports no falls in past 6 months.  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     Hand Dominance        Extremity/Trunk Assessment   Upper Extremity Assessment Upper Extremity Assessment: Overall WFL for tasks assessed    Lower Extremity Assessment Lower Extremity Assessment: RLE deficits/detail;LLE deficits/detail RLE Deficits / Details: at least 3/5 hip flexion AROM; at least 4/5 knee flexion/extension; at least 3/5 DF LLE Deficits / Details: at least 3/5 hip flexion AROM; at least 4/5 knee flexion/extension; at least 3/5 DF; decreased light touch L anterior and lateral thigh (and very slight decreased light touch posterior thigh) LLE Sensation: decreased light touch    Cervical / Trunk Assessment Cervical / Trunk Assessment: Normal  Communication   Communication: No difficulties  Cognition Arousal/Alertness: Awake/alert Behavior During Therapy: WFL for tasks assessed/performed Overall Cognitive Status: Within Functional Limits for tasks assessed                                        General Comments   Nursing cleared pt for participation in physical therapy.  Pt agreeable to PT session.  Pt's husband present during session.    Exercises     Assessment/Plan    PT Assessment Patent does not need any further PT services  PT Problem List         PT Treatment Interventions      PT Goals (Current goals can be found in the Care Plan section)  Acute Rehab PT Goals Patient Stated Goal: to get foley catheter out and go home PT Goal Formulation: With patient Time For Goal Achievement: 08/17/18 Potential to Achieve Goals: Good    Frequency     Barriers to discharge        Co-evaluation               AM-PAC PT "6  Clicks" Daily Activity  Outcome Measure Difficulty turning over in bed (including adjusting bedclothes, sheets and blankets)?: A Little Difficulty moving from lying on back to sitting on the side of the bed? : A Little Difficulty sitting down on and standing up from a chair with arms (e.g., wheelchair, bedside commode, etc,.)?: A Little Help needed moving to and from a bed to chair (including a wheelchair)?: None Help needed walking in hospital room?: None Help needed climbing 3-5 steps with a railing? : A Little 6 Click Score: 20    End of Session Equipment Utilized During Treatment: Gait belt(up high off of incisions) Activity Tolerance: Patient tolerated treatment well Patient left: in bed;with call bell/phone within reach;with family/visitor present Nurse Communication: Mobility status;Precautions;Other (comment)(Pt's pain status) PT Visit Diagnosis: Other abnormalities of gait and mobility (R26.89)    Time: 1607-3710 PT Time Calculation (min) (ACUTE ONLY): 26 min   Charges:   PT Evaluation $PT Eval Low Complexity: 1 Low         Jermiah Soderman, PT 08/03/18,  12:33 PM [phone]

## 2018-08-03 NOTE — Plan of Care (Signed)

## 2018-08-07 ENCOUNTER — Encounter: Payer: Self-pay | Admitting: Gastroenterology

## 2018-08-10 ENCOUNTER — Ambulatory Visit (INDEPENDENT_AMBULATORY_CARE_PROVIDER_SITE_OTHER): Payer: BLUE CROSS/BLUE SHIELD | Admitting: Obstetrics and Gynecology

## 2018-08-10 ENCOUNTER — Encounter: Payer: Self-pay | Admitting: Obstetrics and Gynecology

## 2018-08-10 VITALS — BP 108/60 | Ht 60.0 in | Wt 133.0 lb

## 2018-08-10 DIAGNOSIS — N816 Rectocele: Secondary | ICD-10-CM

## 2018-08-10 DIAGNOSIS — Z4889 Encounter for other specified surgical aftercare: Secondary | ICD-10-CM

## 2018-08-10 DIAGNOSIS — N8110 Cystocele, unspecified: Secondary | ICD-10-CM

## 2018-08-10 NOTE — Progress Notes (Signed)
  Postoperative Follow-up Patient presents post op from anterior and posterior repair and bladder sling for stage 3 rectocele and cystocele 1 week ago. She has been feeling well. She is having soft to runny bowel movements. She started treatment with antibiotics for H. Pylori infection. She has had minimal bleeding and spotting since the surgery. She is resting in bed mostly since the procedure.  Subjective: Patient reports some improvement in her preop symptoms. Eating a regular diet without difficulty. The patient is not having any pain.  Activity: sedentary. Patient reports additional symptom's since surgery of None.  Objective: BP 108/60   Ht 5' (1.524 m)   Wt 133 lb (60.3 kg)   BMI 25.97 kg/m  Physical Exam  Constitutional: She is oriented to person, place, and time. She appears well-developed.  Genitourinary: Vagina normal and uterus normal. There is no lesion on the right labia. There is no lesion on the left labia.    Vagina exhibits no lesion. Right adnexum does not display mass. Left adnexum does not display mass. Cervix does not exhibit motion tenderness.  HENT:  Head: Normocephalic and atraumatic.  Eyes: EOM are normal.  Neck: Neck supple. No thyromegaly present.  Cardiovascular: Normal rate, regular rhythm and normal heart sounds.  Pulmonary/Chest: Effort normal and breath sounds normal. Right breast exhibits no inverted nipple, no mass, no nipple discharge and no skin change. Left breast exhibits no inverted nipple, no mass, no nipple discharge and no skin change.  Abdominal: Soft. Bowel sounds are normal. She exhibits no distension and no mass.  Neurological: She is alert and oriented to person, place, and time.  Skin: Skin is warm and dry.  Psychiatric: She has a normal mood and affect. Her behavior is normal. Judgment and thought content normal.  Vitals reviewed.   Assessment: s/p :  anterior and posterior  repair with retropubic TVT stable  Plan: Patient has done  well after surgery with no apparent complications.  I have discussed the post-operative course to date, and the expected progress moving forward.  The patient understands what complications to be concerned about.  I will see the patient in routine follow up, or sooner if needed.    She is okay to walk and ambulate in the house. No more than 0.5 miles a day. Not okay to do house work. Will need to wait 2 more weeks until driving. Will need to wait another 3 weeks before resuming light house work.   Activity plan: No heavy lifting.  Sarah Moran 08/10/2018, 9:16 AM

## 2018-08-15 ENCOUNTER — Ambulatory Visit: Payer: BLUE CROSS/BLUE SHIELD | Admitting: Family Medicine

## 2018-08-15 ENCOUNTER — Encounter: Payer: Self-pay | Admitting: Family Medicine

## 2018-08-15 VITALS — BP 92/60 | HR 100 | Temp 98.0°F | Ht 60.0 in | Wt 135.2 lb

## 2018-08-15 DIAGNOSIS — N819 Female genital prolapse, unspecified: Secondary | ICD-10-CM | POA: Diagnosis not present

## 2018-08-15 DIAGNOSIS — N644 Mastodynia: Secondary | ICD-10-CM | POA: Diagnosis not present

## 2018-08-15 DIAGNOSIS — A048 Other specified bacterial intestinal infections: Secondary | ICD-10-CM | POA: Diagnosis not present

## 2018-08-15 DIAGNOSIS — Z23 Encounter for immunization: Secondary | ICD-10-CM | POA: Diagnosis not present

## 2018-08-15 NOTE — Progress Notes (Signed)
Tommi Rumps, MD Phone: [phone]  Sarah Moran is a 37 y.o. female who presents today for follow-up.  CC: Right breast pain, right axillary lesion, rectocele/cystocele, H. pylori  Right breast pain/right axillary lesion: Patient notes for the last couple months she has had a fatty lesion in her right axilla.  It is "mushy" per her report.  No discrete mass.  She notes she has had a burning sensation in her breast that subsides with cold compresses.  She notes some discomfort around her nipple.  She had a screening mammogram previously that was negative.  She discussed MRI breasts with her gynecologist.  Notes no nipple bleeding.  She does note occasional whiteness from the nipples bilaterally when she showers though otherwise no discharge.  She notes this whiteness has been going on for a long time.  Rectocele/cystocele: Underwent surgical intervention for this with gynecology.  She notes her pain is improving.  No bleeding.  She notes having some hot flashes where she will all of a sudden feel hot and a little clammy and then feel cold since the surgery.  She has not noted any fevers.  H. pylori: She is on her second round of treatment for this.  She does note some continued reflux though no abdominal pain.  No blood in her stool.  Does note the medications for treating the H. pylori seem to bring on migraines.  This is improved by taking medications with a little bit of food.  She did check with the pharmacist regarding being able to take these with food.  Social History   Tobacco Use  Smoking Status Never Smoker  Smokeless Tobacco Never Used     ROS see history of present illness  Objective  Physical Exam Vitals:   08/15/18 0958  BP: 92/60  Pulse: 100  Temp: 98 F (36.7 C)  SpO2: 98%    BP Readings from Last 3 Encounters:  08/15/18 92/60  08/10/18 108/60  08/03/18 99/61   Wt Readings from Last 3 Encounters:  08/15/18 135 lb 3.2 oz (61.3 kg)  08/10/18 133 lb  (60.3 kg)  07/30/18 132 lb 9.6 oz (60.1 kg)    Physical Exam  Constitutional: No distress.  Cardiovascular: Normal rate, regular rhythm and normal heart sounds.  Pulmonary/Chest: Effort normal and breath sounds normal.    Genitourinary:  Genitourinary Comments: Chaperone used, abnormalities as outlined above, otherwise nipples are retracted and patient reports this is chronic and unchanged, no other skin changes, masses, or axillary masses  Musculoskeletal: She exhibits no edema.  Neurological: She is alert.  Skin: Skin is warm and dry. She is not diaphoretic.     Assessment/Plan: Please see individual problem list.  Breast pain, right Diagnostic mammogram and ultrasound ordered.  She will follow-up in 2 to 3 weeks after having this completed.  H. pylori infection She will complete treatment through GI and follow-up with them as planned.  Prolapse of female pelvic organs Patient seems to be doing well following surgery.  She does note some hot flashes since the surgery.  This does not appear to be related to the antibiotics that she is on for H. pylori.  She will discuss this with her gynecologist.   Orders Placed This Encounter  Procedures  . US BREAST LTD UNI LEFT INC AXILLA    Standing Status:   Future    Standing Expiration Date:   10/16/2019    Order Specific Question:   Reason for Exam (SYMPTOM  OR DIAGNOSIS REQUIRED)  Answer:   right breast tenderness around nipple and at 9 oclock location, soft tissue prominence in right axilla, no palpable underlying masses    Order Specific Question:   Preferred imaging location?    Answer:   [facility]  . US BREAST LTD UNI RIGHT INC AXILLA    Standing Status:   Future    Standing Expiration Date:   10/16/2019    Order Specific Question:   Reason for Exam (SYMPTOM  OR DIAGNOSIS REQUIRED)    Answer:   right breast tenderness around nipple and at 9 oclock location, soft tissue prominence in right axilla, no palpable  underlying masses    Order Specific Question:   Preferred imaging location?    Answer:   [facility]  . MM DIAG BREAST TOMO UNI RIGHT    Standing Status:   Future    Standing Expiration Date:   10/16/2019    Order Specific Question:   Reason for Exam (SYMPTOM  OR DIAGNOSIS REQUIRED)    Answer:   right breast tenderness around nipple and at 9 oclock location, soft tissue prominence in right axilla, no palpable underlying masses    Order Specific Question:   Is the patient pregnant?    Answer:   No    Order Specific Question:   Preferred imaging location?    Answer:   [facility]  . Flu Vaccine QUAD 6+ mos PF IM (Fluarix Quad PF)    No orders of the defined types were placed in this encounter.    Tommi Rumps, MD [place]

## 2018-08-15 NOTE — Patient Instructions (Signed)
Nice to see you. We will get a diagnostic mammogram and ultrasound.  We will have you return in 2 to 3 weeks for recheck. We will also have you follow-up with your gynecologist as planned.

## 2018-08-16 DIAGNOSIS — N644 Mastodynia: Secondary | ICD-10-CM | POA: Insufficient documentation

## 2018-08-16 NOTE — Assessment & Plan Note (Signed)
Diagnostic mammogram and ultrasound ordered.  She will follow-up in 2 to 3 weeks after having this completed.

## 2018-08-16 NOTE — Assessment & Plan Note (Signed)
She will complete treatment through GI and follow-up with them as planned.

## 2018-08-16 NOTE — Assessment & Plan Note (Signed)
Patient seems to be doing well following surgery.  She does note some hot flashes since the surgery.  This does not appear to be related to the antibiotics that she is on for H. pylori.  She will discuss this with her gynecologist.

## 2018-08-20 ENCOUNTER — Telehealth: Payer: Self-pay | Admitting: Gastroenterology

## 2018-08-20 ENCOUNTER — Encounter: Payer: Self-pay | Admitting: Gastroenterology

## 2018-08-20 NOTE — Telephone Encounter (Signed)
Pt has questions and concerns. Would like a call back from the dr. or nurse.

## 2018-08-21 ENCOUNTER — Telehealth: Payer: Self-pay | Admitting: Obstetrics and Gynecology

## 2018-08-21 NOTE — Telephone Encounter (Signed)
Please advise. Thank you

## 2018-08-21 NOTE — Telephone Encounter (Signed)
She sent me a my chart message and I responded.

## 2018-08-21 NOTE — Telephone Encounter (Signed)
Pt spoke with Dr. Marius Ditch on today at office, and pt was notified to visit OBGYN.

## 2018-08-21 NOTE — Telephone Encounter (Signed)
Pt had surgery 3 weeks ago and noticed that when she went to restroom and wiped she seen something in her stool and went to the GI doctor about it and they stated that it was a piece of plastic. Patient was advised to let her GYN doctor aware.   Pt cb# [phone] after 1:30pm

## 2018-08-24 ENCOUNTER — Ambulatory Visit
Admission: RE | Admit: 2018-08-24 | Discharge: 2018-08-24 | Disposition: A | Discharge status: Home / Self Care | Payer: BLUE CROSS/BLUE SHIELD | Source: Ambulatory Visit | Attending: Family Medicine | Admitting: Family Medicine

## 2018-08-24 DIAGNOSIS — N644 Mastodynia: Secondary | ICD-10-CM | POA: Insufficient documentation

## 2018-08-24 DIAGNOSIS — R922 Inconclusive mammogram: Secondary | ICD-10-CM | POA: Diagnosis not present

## 2018-08-24 DIAGNOSIS — N6001 Solitary cyst of right breast: Secondary | ICD-10-CM | POA: Diagnosis not present

## 2018-08-24 IMAGING — MG DIGITAL DIAGNOSTIC UNILATERAL RIGHT MAMMOGRAM WITH TOMO AND CAD
8 series · 8 of 24 positions shown · non-contrast
Comparison: Previous exam(s).

CLINICAL DATA: Focal pain right breast.

EXAM:
DIGITAL DIAGNOSTIC RIGHT MAMMOGRAM WITH CAD AND TOMO
ULTRASOUND RIGHT BREAST

[R XCCL synth-2D (1 of 2)]
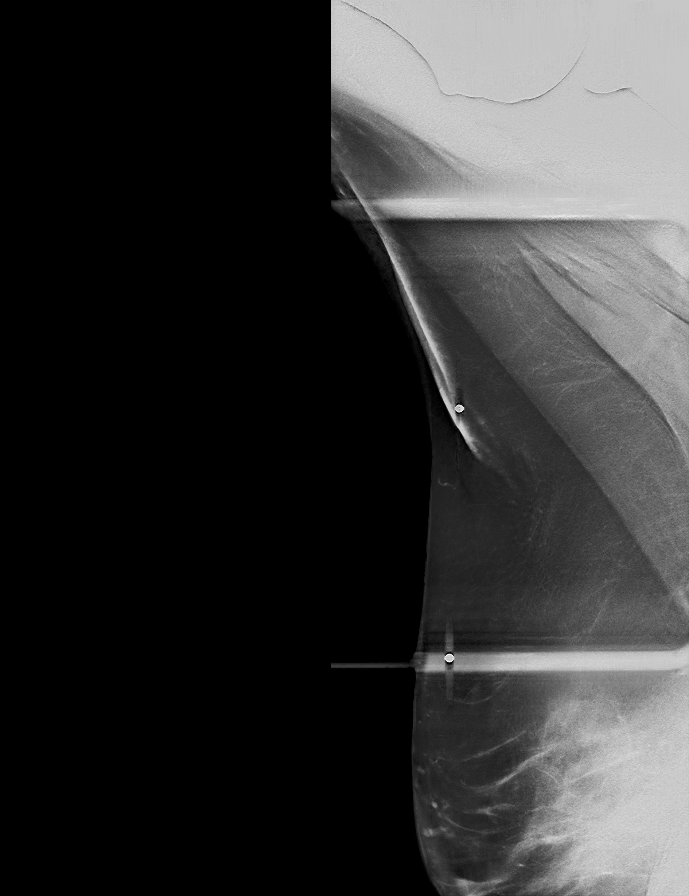

[R MLO synth-2D]
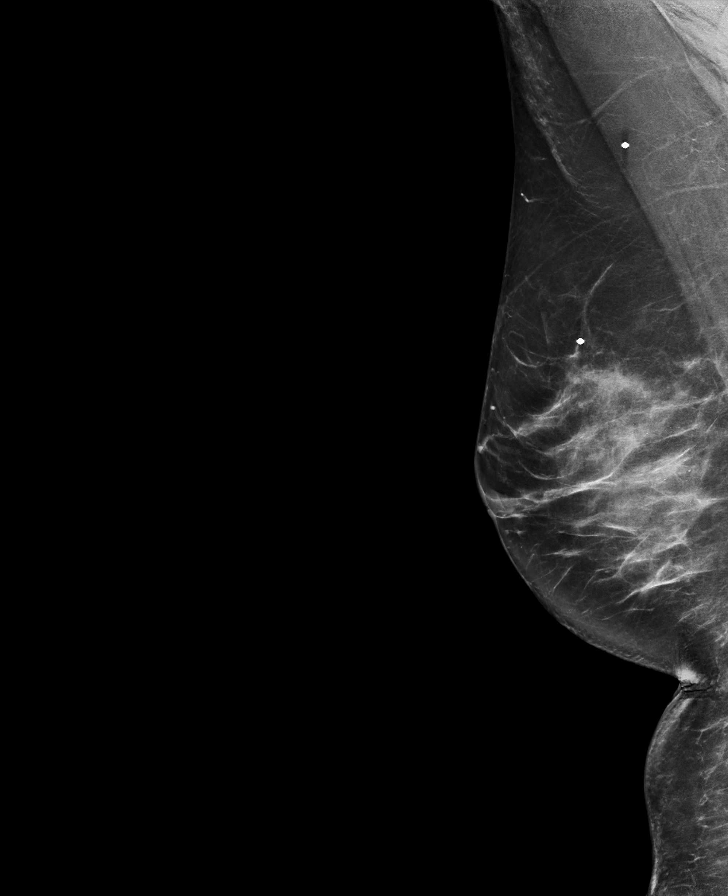

[R CC synth-2D]
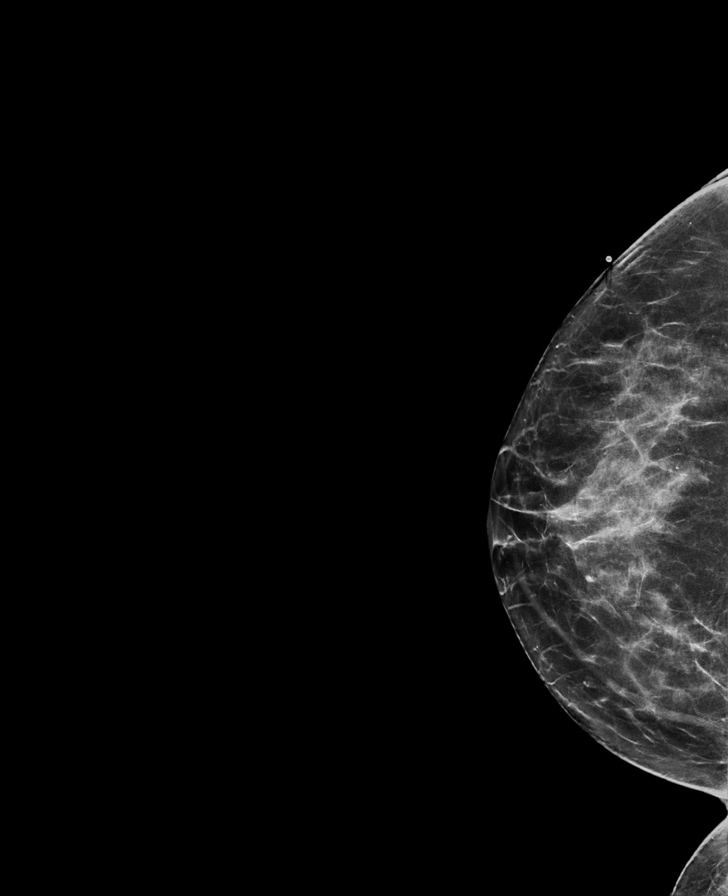

[R XCCL synth-2D (2 of 2)]
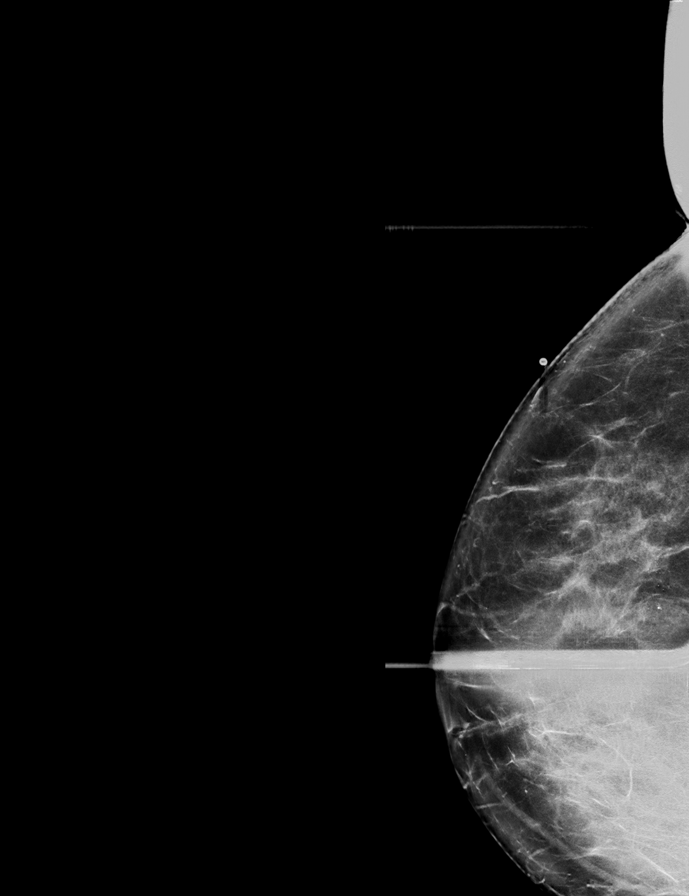

[R XCCL tomo (1 of 2) · tomo slice 37/72.0]
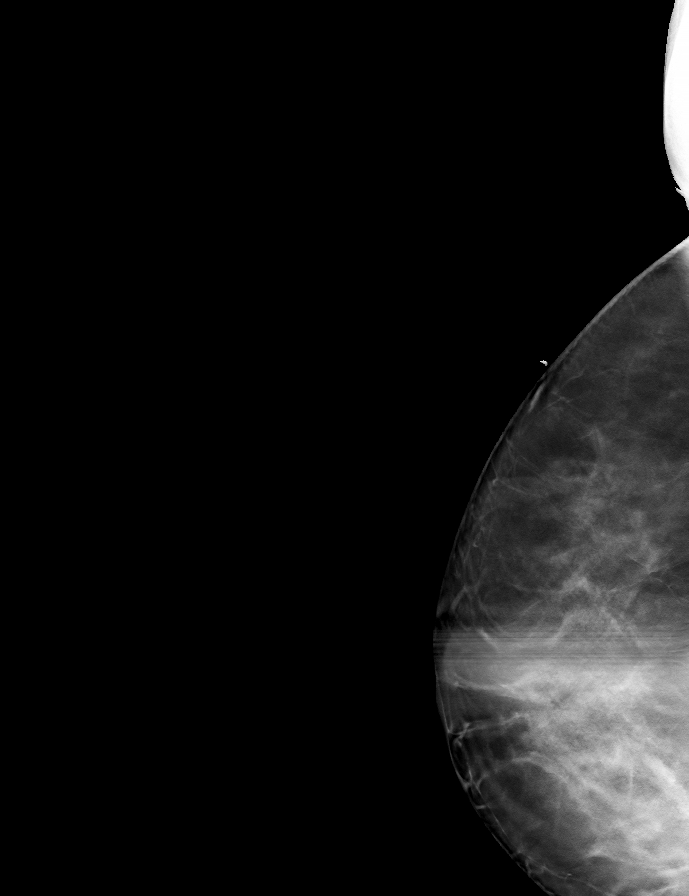

[R CC tomo · tomo slice 33/64.0]
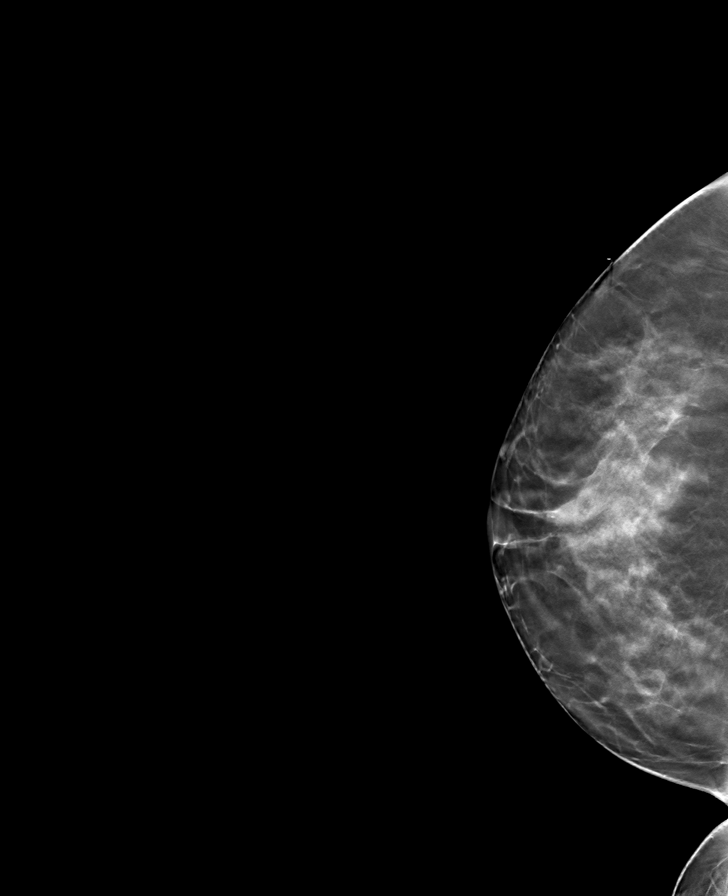

[R XCCL tomo (2 of 2) · tomo slice 53/106.0]
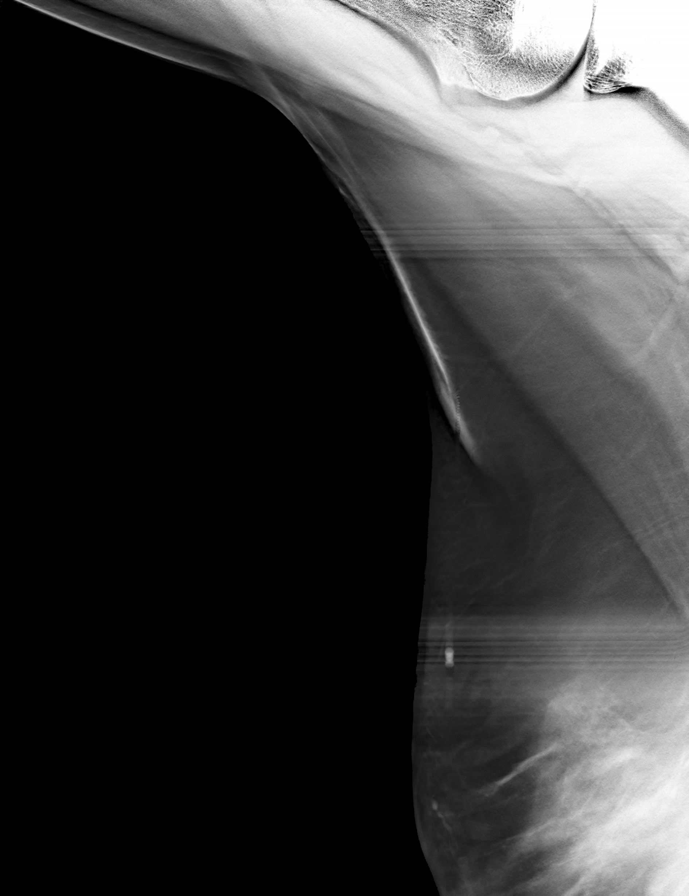

[R MLO tomo · tomo slice 39/77.0]
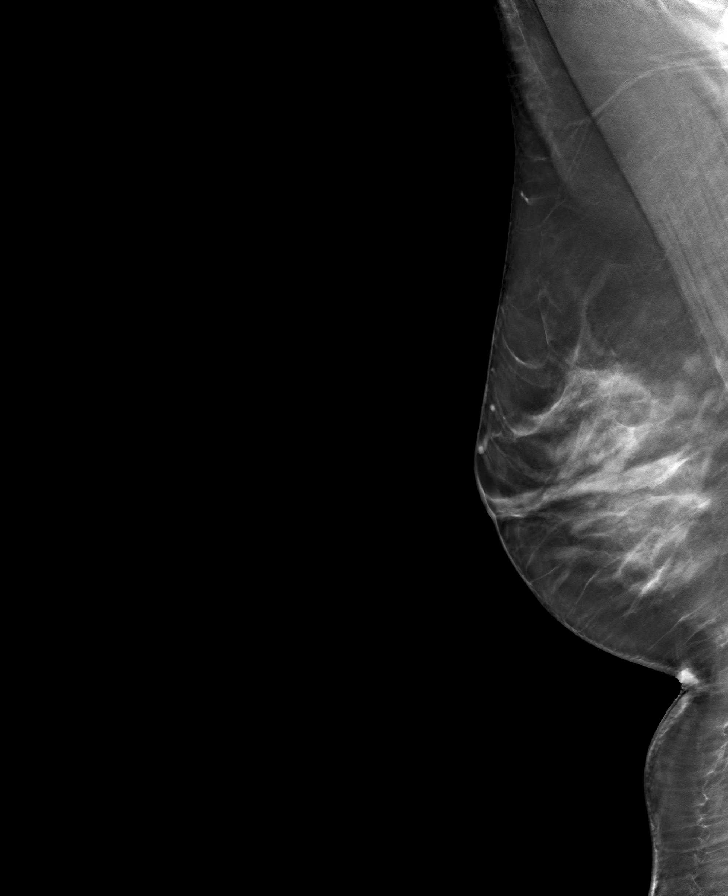

[8 of 24 positions shown; findings below may reference images not displayed]

ACR Breast Density Category c: The breast tissue is heterogeneously
dense, which may obscure small masses.
FINDINGS: Cc and MLO views of right breast, exaggerated right CC view are
submitted. No suspicious abnormalities identified in the right
breast.

Mammographic images were processed with CAD.

Targeted ultrasound is performed, showing 5.7 mm simple cyst in the
retroareolar right breast. No other focal discrete cystic or solid
lesion is identified in the retroareolar right breast, right breast
8 o'clock, right breast 10 o'clock, right axilla areas of pain.
IMPRESSION: Benign findings.

RECOMMENDATION:
Routine screening mammogram back on schedule.

I have discussed the findings and recommendations with the patient.
Results were also provided in writing at the conclusion of the
visit. If applicable, a reminder letter will be sent to the patient
regarding the next appointment.

BI-RADS CATEGORY  2: Benign.

## 2018-08-30 NOTE — Telephone Encounter (Signed)
I'm okay for anytime for her appointment. She can come in tomorrow at 1:15 for an appointment. Thank you,  Dr. Gilman Schmidt

## 2018-08-31 ENCOUNTER — Ambulatory Visit (INDEPENDENT_AMBULATORY_CARE_PROVIDER_SITE_OTHER): Payer: BLUE CROSS/BLUE SHIELD | Admitting: Obstetrics and Gynecology

## 2018-08-31 ENCOUNTER — Encounter: Payer: Self-pay | Admitting: Obstetrics and Gynecology

## 2018-08-31 VITALS — BP 110/72 | HR 98 | Ht 60.0 in | Wt 136.0 lb

## 2018-08-31 DIAGNOSIS — R300 Dysuria: Secondary | ICD-10-CM | POA: Diagnosis not present

## 2018-08-31 LAB — POCT URINALYSIS DIPSTICK
Bilirubin, UA: NEGATIVE
Glucose, UA: NEGATIVE
Ketones, UA: NEGATIVE
Nitrite, UA: NEGATIVE
Protein, UA: NEGATIVE
Spec Grav, UA: <=1.005 — AB (ref 1.010–1.025)
Urobilinogen, UA: 0.2 E.U./dL
pH, UA: 5 (ref 5.0–8.0)

## 2018-08-31 NOTE — Progress Notes (Signed)
  Postoperative Follow-up Patient presents post op from  [place] for  Prolapse and stress incontinence , 1 month ago.  Subjective: Patient reports some improvement in her preop symptoms. Eating a regular diet without difficulty. Pain is controlled without any medications.  Activity: normal activities of daily living. Patient reports additional symptom's since surgery of None.  Objective: BP 110/72   Pulse 98   Ht 5' (1.524 m)   Wt 136 lb (61.7 kg)   BMI 26.56 kg/m  OBGyn Exam  Assessment: s/p :  ANTERIOR AND POSTERIOR REPAIR RETROPUBIC PUBO-VAGINAL SLING- TVT PERINEORRHAPHY  stable  Plan: Patient has done well after surgery with no apparent complications.  I have discussed the post-operative course to date, and the expected progress moving forward.  The patient understands what complications to be concerned about.  I will see the patient in routine follow up, or sooner if needed.    Activity plan: No heavy lifting. Pelvic rest.  Adrian Prows MD [facility] 08/31/18 4:44 PM

## 2018-09-03 ENCOUNTER — Ambulatory Visit: Payer: BLUE CROSS/BLUE SHIELD | Admitting: Family Medicine

## 2018-09-03 VITALS — BP 98/60 | HR 71 | Temp 98.0°F | Ht 60.0 in | Wt 134.8 lb

## 2018-09-03 DIAGNOSIS — N6001 Solitary cyst of right breast: Secondary | ICD-10-CM

## 2018-09-03 DIAGNOSIS — N6452 Nipple discharge: Secondary | ICD-10-CM

## 2018-09-03 DIAGNOSIS — R300 Dysuria: Secondary | ICD-10-CM | POA: Diagnosis not present

## 2018-09-03 DIAGNOSIS — N644 Mastodynia: Secondary | ICD-10-CM | POA: Diagnosis not present

## 2018-09-03 NOTE — Assessment & Plan Note (Addendum)
The patient continues to have issues with breast pain.  Her mammogram did reveal a small simple cyst which could be contributing.  This could also be hormonally mediated.  Given the soft tissue prominence in the cyst will refer to general surgery for further evaluation to determine if the cyst could potentially need to be drained though it is quite small.  We will additionally check a prolactin given her reported white material from her nipples after bathing though I suspect this may be physiologic given the duration of time it has been going on.  I will also have the CMA contact the patient to let her know she could try over-the-counter ibuprofen as that may be beneficial for breast tenderness.

## 2018-09-03 NOTE — Patient Instructions (Signed)
Nice to see you. We will get you into see a surgeon to determine if there is anything additional to be done with your breast tenderness and breast cyst. We will get lab work and contact you with results.

## 2018-09-03 NOTE — Progress Notes (Signed)
  Tommi Rumps, MD Phone: [phone]  Sarah Moran is a 37 y.o. female who presents today for follow-up.  CC: Breast tenderness  Patient was previously seen for breast tenderness and soft tissue prominence near her right axilla.  She underwent mammogram which revealed a small 5.7 mm simple cyst in the right retroareolar breast.  There were no other focal discrete cystic or solid lesions in any of the areas of discomfort.  They recommended routine screening mammogram back on schedule.  She does note continued tenderness.  She notes the burning sensation has gone away.  She does note occasionally having white material from her nipples after she bathes though that has been going on for years.  Social History   Tobacco Use  Smoking Status Never Smoker  Smokeless Tobacco Never Used     ROS see history of present illness  Objective  Physical Exam Vitals:   09/03/18 1640  BP: 98/60  Pulse: 71  Temp: 98 F (36.7 C)  SpO2: 98%    BP Readings from Last 3 Encounters:  09/03/18 98/60  08/31/18 110/72  08/15/18 92/60   Wt Readings from Last 3 Encounters:  09/03/18 134 lb 12.8 oz (61.1 kg)  08/31/18 136 lb (61.7 kg)  08/15/18 135 lb 3.2 oz (61.3 kg)    Physical Exam Genitourinary:    Comments: Chaperone used, right breast with persistent tenderness over the 3 o'clock position of the aereola, the 9 o'clock position of the breast, soft tissue prominence in lateral portion of breast and into axilla with no underlying palpable abnormalities, this area is tender as well, no palpable masses Neurological:     Mental Status: She is alert.      Assessment/Plan: Please see individual problem list.  Breast pain, right The patient continues to have issues with breast pain.  Her mammogram did reveal a small simple cyst which could be contributing.  This could also be hormonally mediated.  Given the soft tissue prominence in the cyst will refer to general surgery for further  evaluation to determine if the cyst could potentially need to be drained though it is quite small.  We will additionally check a prolactin given her reported white material from her nipples after bathing though I suspect this may be physiologic given the duration of time it has been going on.  I will also have the CMA contact the patient to let her know she could try over-the-counter ibuprofen as that may be beneficial for breast tenderness.    Orders Placed This Encounter  Procedures  . Prolactin  . Ambulatory referral to General Surgery    Referral Priority:   Routine    Referral Type:   Surgical    Referral Reason:   Specialty Services Required    Requested Specialty:   General Surgery    Number of Visits Requested:   1    No orders of the defined types were placed in this encounter.    Tommi Rumps, MD [place]

## 2018-09-04 LAB — PROLACTIN: Prolactin: 13.5 ng/mL

## 2018-09-05 ENCOUNTER — Ambulatory Visit: Payer: BLUE CROSS/BLUE SHIELD | Admitting: Gastroenterology

## 2018-09-05 ENCOUNTER — Encounter: Payer: Self-pay | Admitting: Gastroenterology

## 2018-09-05 VITALS — BP 107/75 | HR 80 | Ht 60.0 in | Wt 137.2 lb

## 2018-09-05 DIAGNOSIS — A048 Other specified bacterial intestinal infections: Secondary | ICD-10-CM | POA: Diagnosis not present

## 2018-09-05 DIAGNOSIS — K8050 Calculus of bile duct without cholangitis or cholecystitis without obstruction: Secondary | ICD-10-CM | POA: Diagnosis not present

## 2018-09-05 DIAGNOSIS — K5909 Other constipation: Secondary | ICD-10-CM

## 2018-09-05 LAB — URINE CULTURE

## 2018-09-05 NOTE — Progress Notes (Signed)
Cephas Darby, MD [address]  [place]  [place], [place] [address]  Main: [phone]  Fax: [phone]    Gastroenterology Consultation  Referring Provider:     Leone Haven, MD Primary Care Physician:  Leone Haven, MD Primary Gastroenterologist:  Dr. Cephas Darby Reason for Consultation:     Dyspepsia and chronic constipation        HPI:   Sarah Moran is a 37 y.o. female referred by Dr. Caryl Bis, Angela Adam, MD  for consultation & management of dyspepsia and chronic constipation.  Chronic constipation: suffering from infrequent bowel movements since the delivery of her child, which is more than 10 years. She manages to incorporate more fiber in her diet, takes MiraLAX daily. Fleet enemas as needed. She reports having 1-2 bowel movements daily but associated with incomplete emptying.   Dyspepsia: for the last 6 months, patient has been experiencing right upper quadrant/epigastric discomfort associated with bloating, early satiety. She underwent right upper quadrant Ultrasound which revealed cholelithiasis. Her LFTs are normal. She is started on omeprazole 20 mg once a day. Patient says that she is generally stressful and anxious person, reports having tension knots. She was recommended to see a therapist by her PCP. She has been delaying to see the therapist because she preferred not to   Follow-up visit 06/19/2018 She underwent EGD which revealed H. pylori gastritis, started her on triple therapy.  She tried linaclotide 145 MCG which did not help, currently she is taking 290 MCG.  She continues to have ongoing symptoms of severe constipation.  She also notices bulging of the vagina particularly when she has hard stools associated with significant straining.  She said that she had to push it back after the bowel movement.  She had 2 days of hard bowel movements and she took Fleet enemas which finally helped.  She is on shakes only today, her stools are soft  now.  She says she is drinking more water, coffee in the morning and trying to add more fiber in her diet  Follow-up visit 07/30/18 Patient finished treatment for H pylori.  She reports that linaclotide 290 MCG worked temporarily.  She tried motegrity and develop severe headache after taking 1 pill and caused severe cramps.  She tried Linzess 290 MCG twice daily which helped to move her bowel.  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  Plan to undergo ureteral sling on 08/02/2018.  Follow-up visit 09/05/2018 Patient completed Pylera for recurrent H. pylori infection.  She continues to have intermittent right upper quadrant discomfort radiating to her back.  She does have significant improvement in her epigastric pain.  Patient underwent anterior and posterior repair with bladder sling for stage III rectocele and cystocele on 08/02/2018.  Patient is recovering well from this.  She continues to have severe constipation for which she has been needing Fleet enema since surgery.  She tried linaclotide 290 MCG 2 pills at the time which does help intermittently.  As long as she is not eating solid food, she reports having soft bowel movements.  She incorporates a lot of fiber in smoothies and shakes daily.  She is also scheduled to see general surgery for right axillary lesion on 09/17/2018  NSAIDs: none  Antiplts/Anticoagulants/Anti thrombotics: none  GI Procedures: colonoscopy approximately 10 years ago  Reportedly normal  Past Medical History:  Diagnosis Date  . Allergy   . Anxiety  no meds  . Chronic pelvic pain in female   . Family history of breast cancer    My Risk neg 5/18  . Frequent headaches   . Gallstone   . Gallstones   . Genetic testing of female 01/2017   My Risk/BRCA neg  . GERD (gastroesophageal reflux disease)    well controlled. Takes gingerroot when symptoms  . Hypothyroidism    s/p thyroidectomy  - levothyroxine 75 mcg daily  . IBS (irritable bowel syndrome)    constipation - takes miralax daily  . Increased risk of breast cancer 01/2017   IBIS=18.8%/riskscore=21.9%  . Liver cyst 04/2018  . Migraine    takes excedrin. She has tried imitrex in the past   . Personal history of radiation therapy   . Seizure ([place]) 1997    x 1-age 59-after surgery for scoliosis  . Thyroid cancer ([facility]) 2007   s/p thyroidectomy    Past Surgical History:  Procedure Laterality Date  . ANTERIOR AND POSTERIOR REPAIR WITH SACROSPINOUS FIXATION N/A 08/02/2018   Procedure: ANTERIOR AND POSTERIOR REPAIR;  Surgeon: Homero Fellers, MD;  Location: [facility];  Service: Gynecology;  Laterality: N/A;  . BACK SURGERY  1997   scoliosis  . COLONOSCOPY    . ESOPHAGOGASTRODUODENOSCOPY (EGD) WITH PROPOFOL N/A 06/12/2018   Procedure: ESOPHAGOGASTRODUODENOSCOPY (EGD) WITH PROPOFOL;  Surgeon: Lin Landsman, MD;  Location: [facility];  Service: Gastroenterology;  Laterality: N/A;  . HERNIA REPAIR  1990   bilateral inguinal  . PUBOVAGINAL SLING N/A 08/02/2018   Procedure: PUBO-VAGINAL SLING- TVT;  Surgeon: Homero Fellers, MD;  Location: [facility];  Service: Gynecology;  Laterality: N/A;  . THYROIDECTOMY  2007   thyroid cancer  . TOTAL ABDOMINAL HYSTERECTOMY  2008   dymenorrhea    Current Outpatient Medications:  .  cetirizine (ZYRTEC) 10 MG tablet, Take 10 mg by mouth at bedtime. , Disp: , Rfl:  .  Cholecalciferol (VITAMIN D3) 50 MCG (2000 UT) capsule, Take 2,000 Units by mouth daily., Disp: , Rfl:  .  Cod Liver Oil CAPS, Take by mouth., Disp: , Rfl:  .  EQ FIBER SUPPLEMENT PO, Take 2 tablets by mouth 3 (three) times daily., Disp: , Rfl:  .  fluticasone (FLONASE) 50 MCG/ACT nasal spray, Place 2 sprays into both nostrils daily., Disp: , Rfl:  .  LINZESS 290 MCG CAPS capsule, TAKE 1 CAPSULE (290 MCG TOTAL) BY MOUTH DAILY BEFORE BREAKFAST. (Patient taking differently: Take by mouth 2 (two) times  daily. ), Disp: 30 capsule, Rfl: 0 .  Omega-3 Fatty Acids (FISH OIL) 1200 MG CAPS, Take by mouth., Disp: , Rfl:  .  SUMAtriptan (IMITREX) 50 MG tablet, TAKE ONE TABLET BY MOUTH AT ONSET OF MIGRAINE - MAY REPEAT DOSE IN 2 HOURS IF HEADACHE PERSISTS OR RECURS (Patient taking differently: Take 50 mg by mouth every 2 (two) hours as needed for migraine. ), Disp: 10 tablet, Rfl: 11 .  SYNTHROID 75 MCG tablet, TAKE 1 TABLET (75 MCG TOTAL) BY MOUTH DAILY BEFORE BREAKFAST., Disp: 90 tablet, Rfl: 1 .  tiZANidine (ZANAFLEX) 4 MG tablet, Take 1 tablet (4 mg total) by mouth every 6 (six) hours as needed for muscle spasms., Disp: 30 tablet, Rfl: 0   Family History  Problem Relation Age of Onset  . Hyperlipidemia Father   . Hypertension Father   . Clotting disorder Sister   . Hepatitis B Brother   . Cancer Paternal Grandmother 87       breast cancer? with mets  .  Breast cancer Paternal Grandmother 10  . Cancer Mother        Cervical Cancer  . Cancer Maternal Aunt        throat cancer  . Breast cancer Paternal Aunt        5  . Breast cancer Paternal Aunt        81  . Breast cancer Paternal Aunt        17     Social History   Tobacco Use  . Smoking status: Never Smoker  . Smokeless tobacco: Never Used  Substance Use Topics  . Alcohol use: Yes    Comment: Occasionally - very rarely  . Drug use: No    Allergies as of 09/05/2018 - Review Complete 09/05/2018  Allergen Reaction Noted  . Darvon [propoxyphene] Other (See Comments) 09/23/2013  . Onion Other (See Comments) 07/27/2018  . Percocet [oxycodone-acetaminophen] Hives 09/23/2013  . Vicodin [hydrocodone-acetaminophen] Hives 09/23/2013    Review of Systems:    All systems reviewed and negative except where noted in HPI.   Physical Exam:  BP 107/75   Pulse 80   Ht 5' (1.524 m)   Wt 137 lb 3.2 oz (62.2 kg)   BMI 26.80 kg/m  No LMP recorded. Patient has had a hysterectomy.  General:   Alert,  Well-developed, well-nourished,  pleasant and cooperative in NAD Head:  Normocephalic and atraumatic. Eyes:  Sclera clear, no icterus.   Conjunctiva pink. Ears:  Normal auditory acuity. Nose:  No deformity, discharge, or lesions. Mouth:  No deformity or lesions,oropharynx pink & moist. Neck:  Supple; no masses or thyromegaly. Lungs:  Respirations even and unlabored.  Clear throughout to auscultation.   No wheezes, crackles, or rhonchi. No acute distress. Heart:  Regular rate and rhythm; no murmurs, clicks, rubs, or gallops. Abdomen:  Normal bowel sounds. Soft, non-tender and non-distended without masses, hepatosplenomegaly or hernias noted.  No guarding or rebound tenderness.   Rectal: Digital rectal exam revealed soft brown stool in the rectal vault, skin tags only, when I asked her to bear down, there was mild prolapse of the vagina which spontaneously reduced Msk:  Symmetrical without gross deformities. Good, equal movement & strength bilaterally. Pulses:  Normal pulses noted. Extremities:  No clubbing or edema.  No cyanosis. Neurologic:  Alert and oriented x3;  grossly normal neurologically. Skin:  Intact without significant lesions or rashes. No jaundice. Psych:  Alert and cooperative. Normal mood and affect.  Imaging Studies: reviewed  Assessment and Plan:   Sarah Moran is a 37 y.o. Caucasian female with chronic constipation,six-month history of dyspepsia, and history of anxiety   Chronic idiopathic constipation: Which led to rectocele and cystocele, status post repair and bladder sling Linaclotide did not help Continue high-fiber diet with adequate fluid intake She is taking Fiberchoice and fiber bars 2-3 times daily.  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 pylori infection based on gastric biopsies Finished triple therapy, H. pylori breath test posttreatment return positive, status post treatment with bismuth quadruple therapy.  She just finished treatment last week.  Will repeat H. pylori breath test in 4 to 6 weeks  Biliary colic: Ultrasound revealed cholelithiasis and radiologic positive Murphy sign but there was no evidence of acute cholecystitis.  She will probably benefit from cholecystectomy Suggested her to discuss with Dr. Celine Ahr, general surgery when she sees her for evaluation of right axillary lesion  Follow up in 2-3 months   Cephas Darby, MD

## 2018-09-07 ENCOUNTER — Ambulatory Visit: Payer: BLUE CROSS/BLUE SHIELD | Admitting: Obstetrics and Gynecology

## 2018-09-13 NOTE — Progress Notes (Signed)
Called patient and was unable to leave a VM to call back du eto mailbox being full at this time.

## 2018-09-14 ENCOUNTER — Telehealth: Payer: Self-pay | Admitting: Gastroenterology

## 2018-09-14 ENCOUNTER — Emergency Department
Admission: EM | Admit: 2018-09-14 | Discharge: 2018-09-14 | Disposition: A | Discharge status: Home / Self Care | Payer: BLUE CROSS/BLUE SHIELD

## 2018-09-14 ENCOUNTER — Encounter: Payer: Self-pay | Admitting: Obstetrics and Gynecology

## 2018-09-14 ENCOUNTER — Ambulatory Visit (INDEPENDENT_AMBULATORY_CARE_PROVIDER_SITE_OTHER): Payer: BLUE CROSS/BLUE SHIELD | Admitting: Obstetrics and Gynecology

## 2018-09-14 ENCOUNTER — Telehealth: Payer: Self-pay | Admitting: Obstetrics and Gynecology

## 2018-09-14 VITALS — BP 90/60 | HR 72 | Ht 60.0 in | Wt 134.0 lb

## 2018-09-14 DIAGNOSIS — N816 Rectocele: Secondary | ICD-10-CM

## 2018-09-14 DIAGNOSIS — K5909 Other constipation: Secondary | ICD-10-CM

## 2018-09-14 DIAGNOSIS — N8110 Cystocele, unspecified: Secondary | ICD-10-CM

## 2018-09-14 NOTE — Telephone Encounter (Signed)
Called and spoke with patient, She took 5 fleets enemas at home and used her fingers to disimpact herself. She said there was blood afterwards and she was in pain. She took a Tylenol 3. She is now doing better. She went to the ER but it will be 3 hours before she is seen. She thinks she will leave. She is going to restart linzess and miralax which worked best for her in the past.  She [place] GI doctor [place] and she did not get a response.

## 2018-09-14 NOTE — Telephone Encounter (Signed)
Patient called in today stating she is in a lot of pain & unable to go to the bathroom with out taking fleets.She uses [facility] on [address] in [place].

## 2018-09-14 NOTE — Progress Notes (Signed)
Patient ID: Sarah Moran, female   DOB: 07-29-1981, 37 y.o.   MRN: [id number]  Reason for Consult: Follow-up (pt her bowels are small and hurt, has to take fleets for stool to come out)   Referred by Leone Haven, MD  Subjective:     HPI:  Sarah Moran is a 37 y.o. female .  [name] is very distressed because she is having difficulty passing stool.  She reports that she used a fleets enema on Tuesday to have a bowel movement.  After she used the enema she was able to have a large bowel movement.  She reports that since then she is only passed a small amount of firm stool.  She has been adjusting her diet to try and accommodate bowel movements.  She takes fiber bars and uses shakes.  She eats celery and Chia seeds.  She avoids foods that are compacting such as corn dogs and chips which she does like to eat.  She feels like she can no longer eat like a normal person and it has become exhausting and draining for her emotionally.  She has tried many different motility medications recently with very limited success.  Her best bowel movements was were when she was taking antibiotics for H. Pylori.  She supposed to have a referral to a motility expert but has not heard about scheduling that appointment.  She currently does not have a bathroom routine and does not try to sit on the toilet at the same time each day.  She is only currently having bowel movements after the uses usage of fleets enemas. She is using a Physiological scientist.  She is concerned about her repair.  She feels like she can see a bulge at the introitus.  She has been straining to have bowel movements.  The other day she felt 2 pops.  She has been passing small pieces of suture.  She has some tenderness after sitting upright for long periods of time.   She was tearful after discussion of all of these issues in the office and feels frustrated.   Past Medical History:  Diagnosis Date  . Allergy   . Anxiety    no meds  . Chronic  pelvic pain in female   . Family history of breast cancer    My Risk neg 5/18  . Frequent headaches   . Gallstone   . Gallstones   . Genetic testing of female 01/2017   My Risk/BRCA neg  . GERD (gastroesophageal reflux disease)    well controlled. Takes gingerroot when symptoms  . Hypothyroidism    s/p thyroidectomy - levothyroxine 75 mcg daily  . IBS (irritable bowel syndrome)    constipation - takes miralax daily  . Increased risk of breast cancer 01/2017   IBIS=18.8%/riskscore=21.9%  . Liver cyst 04/2018  . Migraine    takes excedrin. She has tried imitrex in the past   . Personal history of radiation therapy   . Seizure ([place]) 1997    x 1-age 14-after surgery for scoliosis  . Thyroid cancer ([facility] [facility]) 2007   s/p thyroidectomy   Family History  Problem Relation Age of Onset  . Hyperlipidemia Father   . Hypertension Father   . Clotting disorder Sister   . Hepatitis B Brother   . Cancer Paternal Grandmother 61       breast cancer? with mets  . Breast cancer Paternal Grandmother 100  . Cancer Mother        Cervical Cancer  .  Cancer Maternal Aunt        throat cancer  . Breast cancer Paternal Aunt        96  . Breast cancer Paternal Aunt        60  . Breast cancer Paternal Aunt        75   Past Surgical History:  Procedure Laterality Date  . ANTERIOR AND POSTERIOR REPAIR WITH SACROSPINOUS FIXATION N/A 08/02/2018   Procedure: ANTERIOR AND POSTERIOR REPAIR;  Surgeon: Homero Fellers, MD;  Location: [facility];  Service: Gynecology;  Laterality: N/A;  . BACK SURGERY  1997   scoliosis  . COLONOSCOPY    . ESOPHAGOGASTRODUODENOSCOPY (EGD) WITH PROPOFOL N/A 06/12/2018   Procedure: ESOPHAGOGASTRODUODENOSCOPY (EGD) WITH PROPOFOL;  Surgeon: Lin Landsman, MD;  Location: [facility];  Service: Gastroenterology;  Laterality: N/A;  . HERNIA REPAIR  1990   bilateral inguinal  . PUBOVAGINAL SLING N/A 08/02/2018   Procedure: PUBO-VAGINAL SLING- TVT;  Surgeon: Homero Fellers, MD;  Location: [facility];  Service: Gynecology;  Laterality: N/A;  . THYROIDECTOMY  2007   thyroid cancer  . TOTAL ABDOMINAL HYSTERECTOMY  2008   dymenorrhea    Short Social History:  Social History   Tobacco Use  . Smoking status: Never Smoker  . Smokeless tobacco: Never Used  Substance Use Topics  . Alcohol use: Yes    Comment: Occasionally - very rarely    Allergies  Allergen Reactions  . Darvon [Propoxyphene] Other (See Comments)    Hallucinations   . Onion Other (See Comments)    RAW-MIGRAINES  . Percocet [Oxycodone-Acetaminophen] Hives  . Vicodin [Hydrocodone-Acetaminophen] Hives    Current Outpatient Medications  Medication Sig Dispense Refill  . cetirizine (ZYRTEC) 10 MG tablet Take 10 mg by mouth at bedtime.     . Cholecalciferol (VITAMIN D3) 50 MCG (2000 UT) capsule Take 2,000 Units by mouth daily.    Marland Kitchen Cod Liver Oil CAPS Take by mouth.    Noelle Penner FIBER SUPPLEMENT PO Take 2 tablets by mouth 3 (three) times daily.    . fluticasone (FLONASE) 50 MCG/ACT nasal spray Place 2 sprays into both nostrils daily.    . Omega-3 Fatty Acids (FISH OIL) 1200 MG CAPS Take by mouth.    . Plecanatide (TRULANCE) 3 MG TABS Take by mouth daily.    . SUMAtriptan (IMITREX) 50 MG tablet TAKE ONE TABLET BY MOUTH AT ONSET OF MIGRAINE - MAY REPEAT DOSE IN 2 HOURS IF HEADACHE PERSISTS OR RECURS (Patient taking differently: Take 50 mg by mouth every 2 (two) hours as needed for migraine. ) 10 tablet 11  . SYNTHROID 75 MCG tablet TAKE 1 TABLET (75 MCG TOTAL) BY MOUTH DAILY BEFORE BREAKFAST. 90 tablet 1  . tiZANidine (ZANAFLEX) 4 MG tablet Take 1 tablet (4 mg total) by mouth every 6 (six) hours as needed for muscle spasms. 30 tablet 0   No current facility-administered medications for this visit.     Review of Systems  Constitutional: Negative for chills, fatigue, fever and unexpected weight change.  HENT: Negative for trouble swallowing.  Eyes: Negative for loss of vision.    Respiratory: Negative for cough, shortness of breath and wheezing.  Cardiovascular: Negative for chest pain, leg swelling, palpitations and syncope.  GI: Negative for abdominal pain, blood in stool, diarrhea, nausea and vomiting.       + constipation GU: Negative for difficulty urinating, dysuria, frequency and hematuria.  Musculoskeletal: Negative for back pain, leg pain and joint pain.  Skin: Negative for rash.  Neurological: Negative for dizziness, headaches, light-headedness, numbness and seizures.  Psychiatric: Negative for behavioral problem, confusion, depressed mood and sleep disturbance.        Objective:  Objective   Vitals:   09/14/18 0922  BP: 90/60  Pulse: 72  Weight: 134 lb (60.8 kg)  Height: 5' (1.524 m)   Body mass index is 26.17 kg/m.  Physical Exam Vitals signs and nursing note reviewed.  Constitutional:      Appearance: She is well-developed.  HENT:     Head: Normocephalic and atraumatic.  Eyes:     Pupils: Pupils are equal, round, and reactive to light.  Cardiovascular:     Rate and Rhythm: Normal rate and regular rhythm.  Pulmonary:     Effort: Pulmonary effort is normal. No respiratory distress.  Abdominal:     General: There is no distension.     Palpations: Abdomen is soft.  Genitourinary:    Rectum: Normal.     Comments: Anterior and posterior repairs are intact.  Skin:    General: Skin is warm and dry.  Neurological:     Mental Status: She is alert and oriented to person, place, and time.  Psychiatric:        Behavior: Behavior normal.        Thought Content: Thought content normal.        Judgment: Judgment normal.        Assessment/Plan:    37 yo s/p AP repair Difficulty passing firm formed stool Disimpaction attempted but not successful in office today.  Will await bowel movment until Sunday. Will call on Sunday afternoon if no bowel movment - could admit for soap suds enemas until BM Discussed daily time to sit on toilet for  20 minutes to help establish a routine.  She is awaiting referral for a motility expert for possible manometry and MR defecography. AP repair is intact and appears to be healing well.  Reassurance given to the patient. Hoping that new testing can be revealing and new treatment opportunities can be pursued.   More than 40 minutes were spent face to face with the patient in the room with more than 50% of the time spent providing counseling and discussing the plan of management.   Adrian Prows MD [facility] 09/14/2018 6:13 PM

## 2018-09-14 NOTE — Telephone Encounter (Signed)
Patient was seen earlier today by Dr. Gilman Schmidt.  She had a very bad bowel movement.  She is still in a lot of pain.  Should she go to ER?

## 2018-09-14 NOTE — ED Notes (Signed)
Pt husband to desk, states that pt spoke with her doctor and he is going to her see in the office.

## 2018-09-14 NOTE — Telephone Encounter (Signed)
Please advise 

## 2018-09-17 ENCOUNTER — Encounter: Payer: Self-pay | Admitting: General Surgery

## 2018-09-17 ENCOUNTER — Other Ambulatory Visit: Payer: Self-pay

## 2018-09-17 ENCOUNTER — Encounter: Payer: Self-pay | Admitting: *Deleted

## 2018-09-17 ENCOUNTER — Ambulatory Visit (INDEPENDENT_AMBULATORY_CARE_PROVIDER_SITE_OTHER): Payer: BLUE CROSS/BLUE SHIELD | Admitting: General Surgery

## 2018-09-17 VITALS — BP 105/69 | HR 73 | Temp 97.5°F | Resp 16 | Ht 60.0 in | Wt 134.6 lb

## 2018-09-17 DIAGNOSIS — K8020 Calculus of gallbladder without cholecystitis without obstruction: Secondary | ICD-10-CM

## 2018-09-17 NOTE — Progress Notes (Signed)
Patient's surgery has been scheduled for 09-21-2018 at [facility] with Dr. Celine Ahr.  The patient is aware she may be contacted by the [facility] to complete a phone interview sometime in the near future. She recently had surgery 7-8 weeks ago and is aware this may not need to be repeated. If not, she will follow the same instructions she was given for previous surgery.  The patient is aware to call the office should she have further questions.

## 2018-09-17 NOTE — Patient Instructions (Addendum)
Return as needed for breast. The patient is aware to call back for any questions or concerns.   Cholelithiasis  Cholelithiasis is also called "gallstones." It is a kind of gallbladder disease. The gallbladder is an organ that stores a liquid (bile) that helps you digest fat. Gallstones may not cause symptoms (may be silent gallstones) until they cause a blockage, and then they can cause pain (gallbladder attack). Follow these instructions at home:  Take over-the-counter and prescription medicines only as told by your doctor.  Stay at a healthy weight.  Eat healthy foods. This includes: ? Eating fewer fatty foods, like fried foods. ? Eating fewer refined carbs (refined carbohydrates). Refined carbs are breads and grains that are highly processed, like white bread and white rice. Instead, choose whole grains like whole-wheat bread and brown rice. ? Eating more fiber. Almonds, fresh fruit, and beans are healthy sources of fiber.  Keep all follow-up visits as told by your doctor. This is important. Contact a doctor if:  You have sudden pain in the upper right side of your belly (abdomen). Pain might spread to your right shoulder or your chest. This may be a sign of a gallbladder attack.  You feel sick to your stomach (are nauseous).  You throw up (vomit).  You have been diagnosed with gallstones that have no symptoms and you get: ? Belly pain. ? Discomfort, burning, or fullness in the upper part of your belly (indigestion). Get help right away if:  You have sudden pain in the upper right side of your belly, and it lasts for more than 2 hours.  You have belly pain that lasts for more than 5 hours.  You have a fever or chills.  You keep feeling sick to your stomach or you keep throwing up.  Your skin or the whites of your eyes turn yellow (jaundice).  You have dark-colored pee (urine).  You have light-colored poop (stool). Summary  Cholelithiasis is also called  "gallstones."  The gallbladder is an organ that stores a liquid (bile) that helps you digest fat.  Silent gallstones are gallstones that do not cause symptoms.  A gallbladder attack may cause sudden pain in the upper right side of your belly. Pain might spread to your right shoulder or your chest. If this happens, contact your doctor.  If you have sudden pain in the upper right side of your belly that lasts for more than 2 hours, get help right away. This information is not intended to replace advice given to you by your health care provider. Make sure you discuss any questions you have with your health care provider. Document Released: 02/22/2008 Document Revised: 05/22/2016 Document Reviewed: 05/22/2016 Elsevier Interactive Patient Education  2019 [employer].

## 2018-09-17 NOTE — H&P (View-Only) (Signed)
Patient ID: Sarah Moran, female   DOB: [birth year], 37 y.o.   MRN: [id number]  Chief Complaint  Patient presents with  . New Patient (Initial Visit)    right breast pain  . Cholelithiasis    HPI Sarah Moran is a 37 y.o. female.   She was referred by her primary care provider, Dr. Tommi Rumps, for evaluation of breast pain.  She states that it started approximately 6 months ago.  It has improved over that time.  She reports having a burning sensation in the area as well as in her right axilla.  She denies fevers or chills.  She is status post partial hysterectomy (still has ovaries).  She endorses hot flashes.  Her first menstrual period was at age 1.  She has had 2 pregnancies, both children were breast-fed.  She was on birth control for 14 years.  She does have a family history of breast cancer in her paternal grandmother and 3 paternal aunts.  Reports performing monthly self exams.  The electronic medical record does describe some whitish discharge from her nipples that is chronic, but today she denies any nipple discharge.  She is unable to palpate a mass or lump.  She did undergo screening mammography as well as focused right breast and axillary ultrasound.  The mammograms were negative for any concerning findings.  There was a 5.7 mm simple cyst identified in the retroareolar position.  She states that, actually, since she has been treated for Helicobacter pylori the breast pain has improved as has her axillary swelling.  As she no longer has menstrual cycles she is unable to determine whether or not the breast pain is cyclic, however she does not feel like it waxes and wanes.    She is also concerned about her gallstones.  She states that she had right upper quadrant pain that led to an ultrasound.  This showed gallstones but no acute cholecystitis.  She did not have any further studies, such as a nuclear medicine biliary scan or MRCP.  This did, however, lead to upper endoscopy upon which  her Helicobacter infection was identified.  States that she periodically has right upper quadrant pain.  It seems to be not necessarily associated with foods.  It spontaneously resolves without any intervention.  She denies ever having become jaundiced.  She denies acholic stools.  She states that her urine is always tea colored.  She denies diarrhea; she has chronic constipation and is on Linzess for this.   Past Medical History:  Diagnosis Date  . Allergy   . Anxiety    no meds  . Chronic pelvic pain in female   . Family history of breast cancer    My Risk neg 5/18  . Frequent headaches   . Gallstone   . Gallstones   . Genetic testing of female 01/2017   My Risk/BRCA neg  . GERD (gastroesophageal reflux disease)    well controlled. Takes gingerroot when symptoms  . Hypothyroidism    s/p thyroidectomy - levothyroxine 75 mcg daily  . IBS (irritable bowel syndrome)    constipation - takes miralax daily  . Increased risk of breast cancer 01/2017   IBIS=18.8%/riskscore=21.9%  . Liver cyst 04/2018  . Migraine    takes excedrin. She has tried imitrex in the past   . Personal history of radiation therapy   . Seizure ([place]) 1997    x 1-age 31-after surgery for scoliosis  . Thyroid cancer ([place]) 2007   s/p thyroidectomy  Past Surgical History:  Procedure Laterality Date  . ANTERIOR AND POSTERIOR REPAIR WITH SACROSPINOUS FIXATION N/A 08/02/2018   Procedure: ANTERIOR AND POSTERIOR REPAIR;  Surgeon: Homero Fellers, MD;  Location: [facility];  Service: Gynecology;  Laterality: N/A;  . BACK SURGERY  1997   scoliosis  . COLONOSCOPY    . ESOPHAGOGASTRODUODENOSCOPY (EGD) WITH PROPOFOL N/A 06/12/2018   Procedure: ESOPHAGOGASTRODUODENOSCOPY (EGD) WITH PROPOFOL;  Surgeon: Lin Landsman, MD;  Location: [facility];  Service: Gastroenterology;  Laterality: N/A;  . HERNIA REPAIR  1990   bilateral inguinal  . PUBOVAGINAL SLING N/A 08/02/2018   Procedure: PUBO-VAGINAL SLING- TVT;   Surgeon: Homero Fellers, MD;  Location: [facility];  Service: Gynecology;  Laterality: N/A;  . THYROIDECTOMY  2007   thyroid cancer  . TOTAL ABDOMINAL HYSTERECTOMY  2008   dymenorrhea    Family History  Problem Relation Age of Onset  . Hyperlipidemia Father   . Hypertension Father   . Clotting disorder Sister   . Hepatitis B Brother   . Cancer Paternal Grandmother 24       breast cancer? with mets  . Breast cancer Paternal Grandmother 10  . Cancer Mother        Cervical Cancer  . Cancer Maternal Aunt        throat cancer  . Breast cancer Paternal Aunt        53  . Breast cancer Paternal Aunt        53  . Breast cancer Paternal Aunt        21    Social History Social History   Tobacco Use  . Smoking status: Never Smoker  . Smokeless tobacco: Never Used  Substance Use Topics  . Alcohol use: Yes    Comment: Occasionally - very rarely  . Drug use: No    Allergies  Allergen Reactions  . Darvon [Propoxyphene] Other (See Comments)    Hallucinations   . Onion Other (See Comments)    RAW-MIGRAINES  . Percocet [Oxycodone-Acetaminophen] Hives  . Vicodin [Hydrocodone-Acetaminophen] Hives    Current Outpatient Medications  Medication Sig Dispense Refill  . acetaminophen-codeine (TYLENOL #3) 300-30 MG tablet Take by mouth every 4 (four) hours as needed for moderate pain.    . cetirizine (ZYRTEC) 10 MG tablet Take 10 mg by mouth at bedtime.     . Cholecalciferol (VITAMIN D3) 50 MCG (2000 UT) capsule Take 2,000 Units by mouth daily.    Marland Kitchen Cod Liver Oil CAPS Take by mouth.    Noelle Penner FIBER SUPPLEMENT PO Take 2 tablets by mouth 3 (three) times daily.    . fluticasone (FLONASE) 50 MCG/ACT nasal spray Place 2 sprays into both nostrils daily.    . Omega-3 Fatty Acids (FISH OIL) 1200 MG CAPS Take by mouth.    . Plecanatide (TRULANCE) 3 MG TABS Take by mouth daily.    . SUMAtriptan (IMITREX) 50 MG tablet TAKE ONE TABLET BY MOUTH AT ONSET OF MIGRAINE - MAY REPEAT DOSE IN 2 HOURS  IF HEADACHE PERSISTS OR RECURS (Patient taking differently: Take 50 mg by mouth every 2 (two) hours as needed for migraine. ) 10 tablet 11  . SYNTHROID 75 MCG tablet TAKE 1 TABLET (75 MCG TOTAL) BY MOUTH DAILY BEFORE BREAKFAST. 90 tablet 1  . tiZANidine (ZANAFLEX) 4 MG tablet Take 1 tablet (4 mg total) by mouth every 6 (six) hours as needed for muscle spasms. 30 tablet 0   No current facility-administered medications for this visit.  Review of Systems Review of Systems  Constitutional: Negative.   HENT: Negative.   Eyes: Negative.   Respiratory: Negative.   Gastrointestinal: Positive for constipation.  Endocrine: Negative.   Musculoskeletal: Negative.   Skin: Negative.   Allergic/Immunologic: Negative.   Neurological: Positive for headaches.  Hematological: Negative.   Psychiatric/Behavioral: The patient is nervous/anxious.   All other systems reviewed and are negative.   Blood pressure 105/69, pulse 73, temperature (!) 97.5 F (36.4 C), temperature source Tympanic, resp. rate 16, height 5' (1.524 m), weight 134 lb 9.6 oz (61.1 kg), SpO2 100 %.  Physical Exam Physical Exam Vitals signs reviewed. Exam conducted with a chaperone present.  Constitutional:      General: She is not in acute distress.    Appearance: She is normal weight. She is not toxic-appearing.  HENT:     Head: Normocephalic and atraumatic.     Nose: Nose normal.     Mouth/Throat:     Mouth: Mucous membranes are moist.     Pharynx: No oropharyngeal exudate.  Eyes:     General: No scleral icterus.    Conjunctiva/sclera: Conjunctivae normal.     Pupils: Pupils are equal, round, and reactive to light.  Neck:     Musculoskeletal: Normal range of motion.     Comments: Surgical scar consistent with history of thyroidectomy. Cardiovascular:     Rate and Rhythm: Normal rate and regular rhythm.     Pulses: Normal pulses.     Heart sounds: Normal heart sounds.  Pulmonary:     Effort: Pulmonary effort is  normal.     Breath sounds: Normal breath sounds.  Chest:     Chest wall: No mass, deformity or swelling.     Breasts: Breasts are symmetrical.        Right: Tenderness present. No mass, nipple discharge or skin change.        Left: No mass, nipple discharge or skin change.       Comments: Dense, fibroglandular breast tissue. Tenderness on right in areas marked above. No masses appreciated.  Abdominal:     General: Abdomen is flat.     Palpations: Abdomen is soft.     Tenderness: There is abdominal tenderness in the right upper quadrant. Positive signs include Murphy's sign.     Comments: Tender to deep palpation in the RUQ.  Musculoskeletal: Normal range of motion.  Lymphadenopathy:     Upper Body:     Right upper body: No supraclavicular, axillary or pectoral adenopathy.     Left upper body: No supraclavicular, axillary or pectoral adenopathy.  Skin:    General: Skin is warm and dry.     Coloration: Skin is not jaundiced.     Findings: No rash.  Neurological:     General: No focal deficit present.     Mental Status: She is alert and oriented to person, place, and time.  Psychiatric:        Mood and Affect: Mood normal.        Behavior: Behavior normal.     Data Reviewed CLINICAL DATA:  Focal pain right breast.  EXAM: DIGITAL DIAGNOSTIC RIGHT MAMMOGRAM WITH CAD AND TOMO  ULTRASOUND RIGHT BREAST  COMPARISON:  Previous exam(s).  ACR Breast Density Category c: The breast tissue is heterogeneously dense, which may obscure small masses.  FINDINGS: Cc and MLO views of right breast, exaggerated right CC view are submitted. No suspicious abnormalities identified in the right breast.  Mammographic images were processed with  CAD.  Targeted ultrasound is performed, showing 5.7 mm simple cyst in the retroareolar right breast. No other focal discrete cystic or solid lesion is identified in the retroareolar right breast, right breast 8 o'clock, right breast 10  o'clock, right axilla areas of pain.  IMPRESSION: Benign findings.  CLINICAL DATA:  37 year old female with a history of pain  EXAM: ABDOMEN ULTRASOUND COMPLETE  COMPARISON:  None.  FINDINGS: Gallbladder: Echogenic gallstones layer dependently within the gallbladder. Sonographic Murphy's sign is reported as positive. No gallbladder wall thickening.  Common bile duct: Diameter: 6 mm-7 mm  Liver: Parenchyma of the liver unremarkable. There is anechoic lesion with through transmission in the left liver measuring 2.9 cm x 1.6 cm x 3.3 cm. No internal flow or significant complexity.  IVC: No abnormality visualized.  Pancreas: Visualized portion unremarkable.  Spleen: Size and appearance within normal limits.  Right Kidney: Length: 10.8 cm. Mild fullness in the pelvis of the right kidney. No echogenic foci of the right collecting system.  Left Kidney: Length: 11.4 cm. Echogenicity within normal limits. No mass or hydronephrosis visualized.  Abdominal aorta: No aneurysm visualized.  Other findings: None.  IMPRESSION: The ultrasound survey is equivocal for acute cholecystitis. While the sonographic Murphy's sign is recorded as positive, biliary ductal dilatation is observed, and there are gallstones present within the gallbladder, there is no gallbladder wall thickening, distention, or pericholecystic fluid. If further evaluation is warranted, nuclear medicine HIDA study would be the test of choice.  Mild fullness in the right kidney collecting system, may represent either the patient's normal anatomy with extrarenal pelvis, or early hydronephrosis.  Left liver cyst.  Assessment    Mastalgia without concern for mass.  Symptomatic cholelithiasis.    Plan    I discussed with her that many women have breast pain and we are often unable to appreciate the cause.  I do not believe her cyst is responsible.  She does have fibroglandular breasts.  There does  not seem to be any cyclic variation in her discomfort.  It is interesting that she feels improved after being on antibiotics for helical back to her infection.  She does not drink a lot of caffeine.  I discussed with her that there is no scientific data to support the use of evening primrose oil or abstaining from caffeine in improving the symptoms of mastalgia.  I have recommended that she continue using nonsteroidal anti-inflammatory medications on an as-needed basis.  There are other options, such as danazol, that can be tried if this is persistent and bothersome.  In discussion regarding her gallbladder, I think she would probably benefit from having her gallbladder removed. I discussed the procedure in detail.  We discussed the risks and benefits of a laparoscopic cholecystectomy and possible cholangiogram including, but not limited to: bleeding, infection, injury to surrounding structures such as the intestine or liver, bile leak, retained gallstones, need to convert to an open procedure, prolonged diarrhea, blood clots such as DVT, common bile duct injury, anesthesia risks, and possible need for additional procedures. The patient had the opportunity to ask any questions and these were answered to her satisfaction.  Will schedule surgery.        Fredirick Maudlin 09/17/2018, 10:48 AM

## 2018-09-17 NOTE — Progress Notes (Signed)
Patient ID: [name], female   DOB: [birth year], 37 y.o.   MRN: [id number]  Chief Complaint  Patient presents with  . New Patient (Initial Visit)    right breast pain  . Cholelithiasis    HPI Yetzali Sedor is a 37 y.o. female.   She was referred by her primary care provider, Dr. Tommi Rumps, for evaluation of breast pain.  She states that it started approximately 6 months ago.  It has improved over that time.  She reports having a burning sensation in the area as well as in her right axilla.  She denies fevers or chills.  She is status post partial hysterectomy (still has ovaries).  She endorses hot flashes.  Her first menstrual period was at age 62.  She has had 2 pregnancies, both children were breast-fed.  She was on birth control for 14 years.  She does have a family history of breast cancer in her paternal grandmother and 3 paternal aunts.  Reports performing monthly self exams.  The electronic medical record does describe some whitish discharge from her nipples that is chronic, but today she denies any nipple discharge.  She is unable to palpate a mass or lump.  She did undergo screening mammography as well as focused right breast and axillary ultrasound.  The mammograms were negative for any concerning findings.  There was a 5.7 mm simple cyst identified in the retroareolar position.  She states that, actually, since she has been treated for Helicobacter pylori the breast pain has improved as has her axillary swelling.  As she no longer has menstrual cycles she is unable to determine whether or not the breast pain is cyclic, however she does not feel like it waxes and wanes.    She is also concerned about her gallstones.  She states that she had right upper quadrant pain that led to an ultrasound.  This showed gallstones but no acute cholecystitis.  She did not have any further studies, such as a nuclear medicine biliary scan or MRCP.  This did, however, lead to upper endoscopy upon which  her Helicobacter infection was identified.  States that she periodically has right upper quadrant pain.  It seems to be not necessarily associated with foods.  It spontaneously resolves without any intervention.  She denies ever having become jaundiced.  She denies acholic stools.  She states that her urine is always tea colored.  She denies diarrhea; she has chronic constipation and is on Linzess for this.   Past Medical History:  Diagnosis Date  . Allergy   . Anxiety    no meds  . Chronic pelvic pain in female   . Family history of breast cancer    My Risk neg 5/18  . Frequent headaches   . Gallstone   . Gallstones   . Genetic testing of female 01/2017   My Risk/BRCA neg  . GERD (gastroesophageal reflux disease)    well controlled. Takes gingerroot when symptoms  . Hypothyroidism    s/p thyroidectomy - levothyroxine 75 mcg daily  . IBS (irritable bowel syndrome)    constipation - takes miralax daily  . Increased risk of breast cancer 01/2017   IBIS=18.8%/riskscore=21.9%  . Liver cyst 04/2018  . Migraine    takes excedrin. She has tried imitrex in the past   . Personal history of radiation therapy   . Seizure ([place]) 1997    x 1-age 66-after surgery for scoliosis  . Thyroid cancer ([place]) 2007   s/p thyroidectomy  Past Surgical History:  Procedure Laterality Date  . ANTERIOR AND POSTERIOR REPAIR WITH SACROSPINOUS FIXATION N/A 08/02/2018   Procedure: ANTERIOR AND POSTERIOR REPAIR;  Surgeon: Homero Fellers, MD;  Location: [facility];  Service: Gynecology;  Laterality: N/A;  . BACK SURGERY  1997   scoliosis  . COLONOSCOPY    . ESOPHAGOGASTRODUODENOSCOPY (EGD) WITH PROPOFOL N/A 06/12/2018   Procedure: ESOPHAGOGASTRODUODENOSCOPY (EGD) WITH PROPOFOL;  Surgeon: Lin Landsman, MD;  Location: [place];  Service: Gastroenterology;  Laterality: N/A;  . HERNIA REPAIR  1990   bilateral inguinal  . PUBOVAGINAL SLING N/A 08/02/2018   Procedure: PUBO-VAGINAL SLING- TVT;   Surgeon: Homero Fellers, MD;  Location: [facility];  Service: Gynecology;  Laterality: N/A;  . THYROIDECTOMY  2007   thyroid cancer  . TOTAL ABDOMINAL HYSTERECTOMY  2008   dymenorrhea    Family History  Problem Relation Age of Onset  . Hyperlipidemia Father   . Hypertension Father   . Clotting disorder Sister   . Hepatitis B Brother   . Cancer Paternal Grandmother 69       breast cancer? with mets  . Breast cancer Paternal Grandmother 34  . Cancer Mother        Cervical Cancer  . Cancer Maternal Aunt        throat cancer  . Breast cancer Paternal Aunt        32  . Breast cancer Paternal Aunt        82  . Breast cancer Paternal Aunt        50    Social History Social History   Tobacco Use  . Smoking status: Never Smoker  . Smokeless tobacco: Never Used  Substance Use Topics  . Alcohol use: Yes    Comment: Occasionally - very rarely  . Drug use: No    Allergies  Allergen Reactions  . Darvon [Propoxyphene] Other (See Comments)    Hallucinations   . Onion Other (See Comments)    RAW-MIGRAINES  . Percocet [Oxycodone-Acetaminophen] Hives  . Vicodin [Hydrocodone-Acetaminophen] Hives    Current Outpatient Medications  Medication Sig Dispense Refill  . acetaminophen-codeine (TYLENOL #3) 300-30 MG tablet Take by mouth every 4 (four) hours as needed for moderate pain.    . cetirizine (ZYRTEC) 10 MG tablet Take 10 mg by mouth at bedtime.     . Cholecalciferol (VITAMIN D3) 50 MCG (2000 UT) capsule Take 2,000 Units by mouth daily.    Marland Kitchen Cod Liver Oil CAPS Take by mouth.    Noelle Penner FIBER SUPPLEMENT PO Take 2 tablets by mouth 3 (three) times daily.    . fluticasone (FLONASE) 50 MCG/ACT nasal spray Place 2 sprays into both nostrils daily.    . Omega-3 Fatty Acids (FISH OIL) 1200 MG CAPS Take by mouth.    . Plecanatide (TRULANCE) 3 MG TABS Take by mouth daily.    . SUMAtriptan (IMITREX) 50 MG tablet TAKE ONE TABLET BY MOUTH AT ONSET OF MIGRAINE - MAY REPEAT DOSE IN 2 HOURS  IF HEADACHE PERSISTS OR RECURS (Patient taking differently: Take 50 mg by mouth every 2 (two) hours as needed for migraine. ) 10 tablet 11  . SYNTHROID 75 MCG tablet TAKE 1 TABLET (75 MCG TOTAL) BY MOUTH DAILY BEFORE BREAKFAST. 90 tablet 1  . tiZANidine (ZANAFLEX) 4 MG tablet Take 1 tablet (4 mg total) by mouth every 6 (six) hours as needed for muscle spasms. 30 tablet 0   No current facility-administered medications for this visit.  Review of Systems Review of Systems  Constitutional: Negative.   HENT: Negative.   Eyes: Negative.   Respiratory: Negative.   Gastrointestinal: Positive for constipation.  Endocrine: Negative.   Musculoskeletal: Negative.   Skin: Negative.   Allergic/Immunologic: Negative.   Neurological: Positive for headaches.  Hematological: Negative.   Psychiatric/Behavioral: The patient is nervous/anxious.   All other systems reviewed and are negative.   Blood pressure 105/69, pulse 73, temperature (!) 97.5 F (36.4 C), temperature source Tympanic, resp. rate 16, height 5' (1.524 m), weight 134 lb 9.6 oz (61.1 kg), SpO2 100 %.  Physical Exam Physical Exam Vitals signs reviewed. Exam conducted with a chaperone present.  Constitutional:      General: She is not in acute distress.    Appearance: She is normal weight. She is not toxic-appearing.  HENT:     Head: Normocephalic and atraumatic.     Nose: Nose normal.     Mouth/Throat:     Mouth: Mucous membranes are moist.     Pharynx: No oropharyngeal exudate.  Eyes:     General: No scleral icterus.    Conjunctiva/sclera: Conjunctivae normal.     Pupils: Pupils are equal, round, and reactive to light.  Neck:     Musculoskeletal: Normal range of motion.     Comments: Surgical scar consistent with history of thyroidectomy. Cardiovascular:     Rate and Rhythm: Normal rate and regular rhythm.     Pulses: Normal pulses.     Heart sounds: Normal heart sounds.  Pulmonary:     Effort: Pulmonary effort is  normal.     Breath sounds: Normal breath sounds.  Chest:     Chest wall: No mass, deformity or swelling.     Breasts: Breasts are symmetrical.        Right: Tenderness present. No mass, nipple discharge or skin change.        Left: No mass, nipple discharge or skin change.       Comments: Dense, fibroglandular breast tissue. Tenderness on right in areas marked above. No masses appreciated.  Abdominal:     General: Abdomen is flat.     Palpations: Abdomen is soft.     Tenderness: There is abdominal tenderness in the right upper quadrant. Positive signs include Murphy's sign.     Comments: Tender to deep palpation in the RUQ.  Musculoskeletal: Normal range of motion.  Lymphadenopathy:     Upper Body:     Right upper body: No supraclavicular, axillary or pectoral adenopathy.     Left upper body: No supraclavicular, axillary or pectoral adenopathy.  Skin:    General: Skin is warm and dry.     Coloration: Skin is not jaundiced.     Findings: No rash.  Neurological:     General: No focal deficit present.     Mental Status: She is alert and oriented to person, place, and time.  Psychiatric:        Mood and Affect: Mood normal.        Behavior: Behavior normal.     Data Reviewed CLINICAL DATA:  Focal pain right breast.  EXAM: DIGITAL DIAGNOSTIC RIGHT MAMMOGRAM WITH CAD AND TOMO  ULTRASOUND RIGHT BREAST  COMPARISON:  Previous exam(s).  ACR Breast Density Category c: The breast tissue is heterogeneously dense, which may obscure small masses.  FINDINGS: Cc and MLO views of right breast, exaggerated right CC view are submitted. No suspicious abnormalities identified in the right breast.  Mammographic images were processed with  CAD.  Targeted ultrasound is performed, showing 5.7 mm simple cyst in the retroareolar right breast. No other focal discrete cystic or solid lesion is identified in the retroareolar right breast, right breast 8 o'clock, right breast 10  o'clock, right axilla areas of pain.  IMPRESSION: Benign findings.  CLINICAL DATA:  37 year old female with a history of pain  EXAM: ABDOMEN ULTRASOUND COMPLETE  COMPARISON:  None.  FINDINGS: Gallbladder: Echogenic gallstones layer dependently within the gallbladder. Sonographic Murphy's sign is reported as positive. No gallbladder wall thickening.  Common bile duct: Diameter: 6 mm-7 mm  Liver: Parenchyma of the liver unremarkable. There is anechoic lesion with through transmission in the left liver measuring 2.9 cm x 1.6 cm x 3.3 cm. No internal flow or significant complexity.  IVC: No abnormality visualized.  Pancreas: Visualized portion unremarkable.  Spleen: Size and appearance within normal limits.  Right Kidney: Length: 10.8 cm. Mild fullness in the pelvis of the right kidney. No echogenic foci of the right collecting system.  Left Kidney: Length: 11.4 cm. Echogenicity within normal limits. No mass or hydronephrosis visualized.  Abdominal aorta: No aneurysm visualized.  Other findings: None.  IMPRESSION: The ultrasound survey is equivocal for acute cholecystitis. While the sonographic Murphy's sign is recorded as positive, biliary ductal dilatation is observed, and there are gallstones present within the gallbladder, there is no gallbladder wall thickening, distention, or pericholecystic fluid. If further evaluation is warranted, nuclear medicine HIDA study would be the test of choice.  Mild fullness in the right kidney collecting system, may represent either the patient's normal anatomy with extrarenal pelvis, or early hydronephrosis.  Left liver cyst.  Assessment    Mastalgia without concern for mass.  Symptomatic cholelithiasis.    Plan    I discussed with her that many women have breast pain and we are often unable to appreciate the cause.  I do not believe her cyst is responsible.  She does have fibroglandular breasts.  There does  not seem to be any cyclic variation in her discomfort.  It is interesting that she feels improved after being on antibiotics for helical back to her infection.  She does not drink a lot of caffeine.  I discussed with her that there is no scientific data to support the use of evening primrose oil or abstaining from caffeine in improving the symptoms of mastalgia.  I have recommended that she continue using nonsteroidal anti-inflammatory medications on an as-needed basis.  There are other options, such as danazol, that can be tried if this is persistent and bothersome.  In discussion regarding her gallbladder, I think she would probably benefit from having her gallbladder removed. I discussed the procedure in detail.  We discussed the risks and benefits of a laparoscopic cholecystectomy and possible cholangiogram including, but not limited to: bleeding, infection, injury to surrounding structures such as the intestine or liver, bile leak, retained gallstones, need to convert to an open procedure, prolonged diarrhea, blood clots such as DVT, common bile duct injury, anesthesia risks, and possible need for additional procedures. The patient had the opportunity to ask any questions and these were answered to her satisfaction.  Will schedule surgery.        Fredirick Maudlin 09/17/2018, 10:48 AM

## 2018-09-20 ENCOUNTER — Encounter
Admission: RE | Admit: 2018-09-20 | Discharge: 2018-09-20 | Disposition: A | Discharge status: Home / Self Care | Payer: BLUE CROSS/BLUE SHIELD | Source: Ambulatory Visit | Attending: General Surgery | Admitting: General Surgery

## 2018-09-20 ENCOUNTER — Other Ambulatory Visit: Payer: Self-pay

## 2018-09-20 MED ORDER — CEFAZOLIN SODIUM-DEXTROSE 2-4 GM/100ML-% IV SOLN
2.0000 g | INTRAVENOUS | Status: AC
  Administered 2018-09-21: 2 g via INTRAVENOUS

## 2018-09-20 NOTE — Patient Instructions (Signed)
Your procedure is scheduled on: 09-21-18 FRIDAY Report to Same Day Surgery 2nd floor medical mall [facility] Entrance-take elevator on left to 2nd floor.  Check in with surgery information desk.) To find out your arrival time please call [phone] between 1PM - 3PM on 09-20-18 THURSDAY  Remember: Instructions that are not followed completely may result in serious medical risk, up to and including death, or upon the discretion of your surgeon and anesthesiologist your surgery may need to be rescheduled.    _x___ 1. Do not eat food after midnight the night before your procedure. NO GUM OR CANDY AFTER MIDNIGHT.  You may drink clear liquids up to 2 hours before you are scheduled to arrive at the hospital for your procedure.  Do not drink clear liquids within 2 hours of your scheduled arrival to the hospital.  Clear liquids include  --Water or Apple juice without pulp  --Clear carbohydrate beverage such as ClearFast or Gatorade  --Black Coffee or Clear Tea (No milk, no creamers, do not add anything to the coffee or Tea   ____Ensure clear carbohydrate drink on the way to the hospital for bariatric patients  ____Ensure clear carbohydrate drink 3 hours before surgery for Dr Dwyane Luo patients if physician instructed.    __x__ 2. No Alcohol for 24 hours before or after surgery.   __x__3. No Smoking or e-cigarettes for 24 prior to surgery.  Do not use any chewable tobacco products for at least 6 hour prior to surgery   ____  4. Bring all medications with you on the day of surgery if instructed.    __x__ 5. Notify your doctor if there is any change in your medical condition     (cold, fever, infections).    x___6. On the morning of surgery brush your teeth with toothpaste and water.  You may rinse your mouth with mouth wash if you wish.  Do not swallow any toothpaste or mouthwash.   Do not wear jewelry, make-up, hairpins, clips or nail polish.  Do not wear lotions, powders, or perfumes. You  may wear deodorant.  Do not shave 48 hours prior to surgery. Men may shave face and neck.  Do not bring valuables to the hospital.    [facility] is not responsible for any belongings or valuables.               Contacts, dentures or bridgework may not be worn into surgery.  Leave your suitcase in the car. After surgery it may be brought to your room.  For patients admitted to the hospital, discharge time is determined by your treatment team.  _  Patients discharged the day of surgery will not be allowed to drive home.  You will need someone to drive you home and stay with you the night of your procedure.    Please read over the following fact sheets that you were given:   [facility] Preparing for Surgery  _x___ TAKE THE FOLLOWING MEDICATION THE MORNING OF SURGERY WITH A SMALL SIP OF WATER. These include:  1. SYNTHROID  2.  3.  4.  5.  6.  ____Fleets enema or Magnesium Citrate as directed.   _x___ Use CHG Soap or sage wipes as directed on instruction sheet   ____ Use inhalers on the day of surgery and bring to hospital day of surgery  ____ Stop Metformin and Janumet 2 days prior to surgery.    ____ Take 1/2 of usual insulin dose the night before surgery and  none on the morning surgery.   ____ Follow recommendations from Cardiologist, Pulmonologist or PCP regarding stopping Aspirin, Coumadin, Plavix ,Eliquis, Effient, or Pradaxa, and Pletal.  X____Stop Anti-inflammatories such as Advil, Aleve, Ibuprofen, Motrin, Naproxen, Naprosyn, Goodies powders or aspirin products NOW-OK to take Tylenol    _x___ Stop supplements until after surgery-PT STOPPED ALL SUPPLEMENTS ON 09-17-18   ____ Bring C-Pap to the hospital.

## 2018-09-21 ENCOUNTER — Ambulatory Visit: Payer: BLUE CROSS/BLUE SHIELD | Admitting: Anesthesiology

## 2018-09-21 ENCOUNTER — Other Ambulatory Visit: Payer: Self-pay

## 2018-09-21 ENCOUNTER — Ambulatory Visit
Admission: RE | Admit: 2018-09-21 | Discharge: 2018-09-21 | Disposition: A | Discharge status: Home / Self Care | Payer: BLUE CROSS/BLUE SHIELD | Attending: General Surgery | Admitting: General Surgery

## 2018-09-21 ENCOUNTER — Encounter
Admission: RE | Disposition: A | Discharge status: Home / Self Care | Payer: Self-pay | Source: Home / Self Care | Attending: General Surgery

## 2018-09-21 DIAGNOSIS — Z885 Allergy status to narcotic agent status: Secondary | ICD-10-CM | POA: Diagnosis not present

## 2018-09-21 DIAGNOSIS — K8010 Calculus of gallbladder with chronic cholecystitis without obstruction: Secondary | ICD-10-CM | POA: Diagnosis not present

## 2018-09-21 DIAGNOSIS — K7689 Other specified diseases of liver: Secondary | ICD-10-CM | POA: Insufficient documentation

## 2018-09-21 DIAGNOSIS — Z803 Family history of malignant neoplasm of breast: Secondary | ICD-10-CM | POA: Diagnosis not present

## 2018-09-21 DIAGNOSIS — Z85850 Personal history of malignant neoplasm of thyroid: Secondary | ICD-10-CM | POA: Insufficient documentation

## 2018-09-21 DIAGNOSIS — Z79899 Other long term (current) drug therapy: Secondary | ICD-10-CM | POA: Diagnosis not present

## 2018-09-21 DIAGNOSIS — N6452 Nipple discharge: Secondary | ICD-10-CM | POA: Insufficient documentation

## 2018-09-21 DIAGNOSIS — F419 Anxiety disorder, unspecified: Secondary | ICD-10-CM | POA: Diagnosis not present

## 2018-09-21 DIAGNOSIS — K8020 Calculus of gallbladder without cholecystitis without obstruction: Secondary | ICD-10-CM

## 2018-09-21 HISTORY — PX: CHOLECYSTECTOMY: SHX55

## 2018-09-21 SURGERY — LAPAROSCOPIC CHOLECYSTECTOMY
Anesthesia: General

## 2018-09-21 MED ORDER — DEXAMETHASONE SODIUM PHOSPHATE 10 MG/ML IJ SOLN
INTRAMUSCULAR | Status: DC | PRN
  Administered 2018-09-21: 5 mg via INTRAVENOUS

## 2018-09-21 MED ORDER — CHLORHEXIDINE GLUCONATE CLOTH 2 % EX PADS: 6.0000 | MEDICATED_PAD | Freq: Once | CUTANEOUS | Status: DC

## 2018-09-21 MED ORDER — GLYCOPYRROLATE 0.2 MG/ML IJ SOLN
INTRAMUSCULAR | Status: DC | PRN
  Administered 2018-09-21: .5 mg via INTRAVENOUS

## 2018-09-21 MED ORDER — CELECOXIB 200 MG PO CAPS
ORAL_CAPSULE | ORAL | Status: AC
  Filled 2018-09-21: qty 1

## 2018-09-21 MED ORDER — DEXAMETHASONE SODIUM PHOSPHATE 10 MG/ML IJ SOLN
INTRAMUSCULAR | Status: AC
  Filled 2018-09-21: qty 1

## 2018-09-21 MED ORDER — ROCURONIUM BROMIDE 50 MG/5ML IV SOLN
INTRAVENOUS | Status: AC
  Filled 2018-09-21: qty 1

## 2018-09-21 MED ORDER — GABAPENTIN 300 MG PO CAPS
300.0000 mg | ORAL_CAPSULE | ORAL | Status: AC
  Administered 2018-09-21: 300 mg via ORAL

## 2018-09-21 MED ORDER — FENTANYL CITRATE (PF) 100 MCG/2ML IJ SOLN
INTRAMUSCULAR | Status: AC
  Administered 2018-09-21: 25 ug via INTRAVENOUS
  Filled 2018-09-21: qty 2

## 2018-09-21 MED ORDER — MIDAZOLAM HCL 2 MG/2ML IJ SOLN
INTRAMUSCULAR | Status: DC | PRN
  Administered 2018-09-21: 2 mg via INTRAVENOUS

## 2018-09-21 MED ORDER — LACTATED RINGERS IV SOLN
INTRAVENOUS | Status: DC
  Administered 2018-09-21: 09:00:00 via INTRAVENOUS

## 2018-09-21 MED ORDER — FAMOTIDINE 20 MG PO TABS
ORAL_TABLET | ORAL | Status: AC
  Filled 2018-09-21: qty 1

## 2018-09-21 MED ORDER — FENTANYL CITRATE (PF) 100 MCG/2ML IJ SOLN
INTRAMUSCULAR | Status: DC | PRN
  Administered 2018-09-21 (×2): 50 ug via INTRAVENOUS

## 2018-09-21 MED ORDER — ONDANSETRON HCL 4 MG/2ML IJ SOLN
INTRAMUSCULAR | Status: AC
  Filled 2018-09-21: qty 2

## 2018-09-21 MED ORDER — LIDOCAINE HCL (CARDIAC) PF 100 MG/5ML IV SOSY
PREFILLED_SYRINGE | INTRAVENOUS | Status: DC | PRN
  Administered 2018-09-21: 60 mg via INTRAVENOUS

## 2018-09-21 MED ORDER — CELECOXIB 200 MG PO CAPS
200.0000 mg | ORAL_CAPSULE | ORAL | Status: AC
  Administered 2018-09-21: 200 mg via ORAL

## 2018-09-21 MED ORDER — PROMETHAZINE HCL 25 MG/ML IJ SOLN
6.2500 mg | INTRAMUSCULAR | Status: DC | PRN
  Administered 2018-09-21: 6.25 mg via INTRAVENOUS

## 2018-09-21 MED ORDER — NEOSTIGMINE METHYLSULFATE 10 MG/10ML IV SOLN
INTRAVENOUS | Status: DC | PRN
  Administered 2018-09-21: 3 mg via INTRAVENOUS

## 2018-09-21 MED ORDER — LIDOCAINE HCL (PF) 2 % IJ SOLN
INTRAMUSCULAR | Status: AC
  Filled 2018-09-21: qty 10

## 2018-09-21 MED ORDER — GABAPENTIN 300 MG PO CAPS
ORAL_CAPSULE | ORAL | Status: AC
  Filled 2018-09-21: qty 1

## 2018-09-21 MED ORDER — CEFAZOLIN SODIUM-DEXTROSE 2-4 GM/100ML-% IV SOLN
INTRAVENOUS | Status: AC
  Filled 2018-09-21: qty 100

## 2018-09-21 MED ORDER — PROPOFOL 10 MG/ML IV BOLUS
INTRAVENOUS | Status: DC | PRN
  Administered 2018-09-21: 150 mg via INTRAVENOUS

## 2018-09-21 MED ORDER — KETAMINE HCL 50 MG/ML IJ SOLN
INTRAMUSCULAR | Status: AC
  Filled 2018-09-21: qty 10

## 2018-09-21 MED ORDER — LIDOCAINE-EPINEPHRINE 1 %-1:100000 IJ SOLN
INTRAMUSCULAR | Status: AC
  Filled 2018-09-21: qty 1

## 2018-09-21 MED ORDER — MIDAZOLAM HCL 2 MG/2ML IJ SOLN
INTRAMUSCULAR | Status: AC
  Filled 2018-09-21: qty 2

## 2018-09-21 MED ORDER — ROCURONIUM BROMIDE 100 MG/10ML IV SOLN
INTRAVENOUS | Status: DC | PRN
  Administered 2018-09-21: 40 mg via INTRAVENOUS

## 2018-09-21 MED ORDER — LIDOCAINE-EPINEPHRINE 1 %-1:100000 IJ SOLN
INTRAMUSCULAR | Status: DC | PRN
  Administered 2018-09-21: 20 mL

## 2018-09-21 MED ORDER — LABETALOL HCL 5 MG/ML IV SOLN
INTRAVENOUS | Status: AC
  Filled 2018-09-21: qty 4

## 2018-09-21 MED ORDER — SODIUM CHLORIDE FLUSH 0.9 % IV SOLN
INTRAVENOUS | Status: AC
  Filled 2018-09-21: qty 10

## 2018-09-21 MED ORDER — PROPOFOL 10 MG/ML IV BOLUS
INTRAVENOUS | Status: AC
  Filled 2018-09-21: qty 20

## 2018-09-21 MED ORDER — FENTANYL CITRATE (PF) 100 MCG/2ML IJ SOLN
25.0000 ug | INTRAMUSCULAR | Status: DC | PRN
  Administered 2018-09-21 (×3): 25 ug via INTRAVENOUS

## 2018-09-21 MED ORDER — FENTANYL CITRATE (PF) 250 MCG/5ML IJ SOLN
INTRAMUSCULAR | Status: AC
  Filled 2018-09-21: qty 5

## 2018-09-21 MED ORDER — FAMOTIDINE 20 MG PO TABS
ORAL_TABLET | ORAL | Status: AC
  Administered 2018-09-21: 20 mg via ORAL
  Filled 2018-09-21: qty 1

## 2018-09-21 MED ORDER — BUPIVACAINE HCL (PF) 0.25 % IJ SOLN
INTRAMUSCULAR | Status: AC
  Filled 2018-09-21: qty 30

## 2018-09-21 MED ORDER — FAMOTIDINE 20 MG PO TABS
20.0000 mg | ORAL_TABLET | Freq: Once | ORAL | Status: AC
  Administered 2018-09-21: 20 mg via ORAL

## 2018-09-21 MED ORDER — PROMETHAZINE HCL 25 MG/ML IJ SOLN
INTRAMUSCULAR | Status: AC
  Administered 2018-09-21: 6.25 mg via INTRAVENOUS
  Filled 2018-09-21: qty 1

## 2018-09-21 SURGICAL SUPPLY — 46 items
APPLIER CLIP 5 13 M/L LIGAMAX5 (MISCELLANEOUS) ×2
BLADE SURG SZ11 CARB STEEL (BLADE) ×2 IMPLANT
CANISTER SUCT 1200ML W/VALVE (MISCELLANEOUS) ×2 IMPLANT
CHLORAPREP W/TINT 26ML (MISCELLANEOUS) ×2 IMPLANT
CLIP APPLIE 5 13 M/L LIGAMAX5 (MISCELLANEOUS) ×1 IMPLANT
COVER WAND RF STERILE (DRAPES) ×2 IMPLANT
DECANTER SPIKE VIAL GLASS SM (MISCELLANEOUS) ×4 IMPLANT
DEFOGGER SCOPE WARMER CLEARIFY (MISCELLANEOUS) ×2 IMPLANT
DERMABOND ADVANCED (GAUZE/BANDAGES/DRESSINGS) ×1
DERMABOND ADVANCED .7 DNX12 (GAUZE/BANDAGES/DRESSINGS) ×1 IMPLANT
DRAPE SHEET LG 3/4 BI-LAMINATE (DRAPES) ×2 IMPLANT
ELECT CAUTERY BLADE TIP 2.5 (TIP) ×2
ELECT REM PT RETURN 9FT ADLT (ELECTROSURGICAL) ×2
ELECTRODE CAUTERY BLDE TIP 2.5 (TIP) ×1 IMPLANT
ELECTRODE REM PT RTRN 9FT ADLT (ELECTROSURGICAL) ×1 IMPLANT
FILTER LAP SMOKE EVAC STRL (MISCELLANEOUS) ×2 IMPLANT
GLOVE BIO SURGEON STRL SZ 6.5 (GLOVE) ×2 IMPLANT
GLOVE INDICATOR 7.0 STRL GRN (GLOVE) ×2 IMPLANT
GOWN STRL REUS W/ TWL LRG LVL3 (GOWN DISPOSABLE) ×3 IMPLANT
GOWN STRL REUS W/TWL LRG LVL3 (GOWN DISPOSABLE) ×3
GRASPER SUT TROCAR 14GX15 (MISCELLANEOUS) IMPLANT
IRRIGATION STRYKERFLOW (MISCELLANEOUS) ×1 IMPLANT
IRRIGATOR STRYKERFLOW (MISCELLANEOUS) ×2
IV NS 1000ML (IV SOLUTION) ×1
IV NS 1000ML BAXH (IV SOLUTION) ×1 IMPLANT
KIT TURNOVER KIT A (KITS) ×2 IMPLANT
L-HOOK LAP DISP 36CM (ELECTROSURGICAL)
LABEL OR SOLS (LABEL) ×2 IMPLANT
LHOOK LAP DISP 36CM (ELECTROSURGICAL) IMPLANT
NEEDLE HYPO 22GX1.5 SAFETY (NEEDLE) ×2 IMPLANT
NS IRRIG 500ML POUR BTL (IV SOLUTION) ×2 IMPLANT
PACK LAP CHOLECYSTECTOMY (MISCELLANEOUS) ×2 IMPLANT
PENCIL ELECTRO HAND CTR (MISCELLANEOUS) ×2 IMPLANT
POUCH SPECIMEN RETRIEVAL 10MM (ENDOMECHANICALS) ×2 IMPLANT
SCISSORS METZENBAUM CVD 33 (INSTRUMENTS) ×2 IMPLANT
SLEEVE ADV FIXATION 5X100MM (TROCAR) ×2 IMPLANT
STRIP CLOSURE SKIN 1/2X4 (GAUZE/BANDAGES/DRESSINGS) ×2 IMPLANT
SUT MNCRL 4-0 (SUTURE) ×1
SUT MNCRL 4-0 27XMFL (SUTURE) ×1
SUT VICRYL 0 AB UR-6 (SUTURE) ×4 IMPLANT
SUT VICRYL+ 3-0 27IN RB-1 (SUTURE) ×2 IMPLANT
SUTURE MNCRL 4-0 27XMF (SUTURE) ×1 IMPLANT
TROCAR BALLN GELPORT 12X130M (ENDOMECHANICALS) ×2 IMPLANT
TROCAR Z-THREAD OPTICAL 5X100M (TROCAR) ×2 IMPLANT
TUBING INSUFFLATION (TUBING) ×2 IMPLANT
WATER STERILE IRR 1000ML POUR (IV SOLUTION) ×2 IMPLANT

## 2018-09-21 NOTE — Discharge Instructions (Signed)
AMBULATORY SURGERY  °DISCHARGE INSTRUCTIONS ° ° °1) The drugs that you were given will stay in your system until tomorrow so for the next 24 hours you should not: ° °A) Drive an automobile °B) Make any legal decisions °C) Drink any alcoholic beverage ° ° °2) You may resume regular meals tomorrow.  Today it is better to start with liquids and gradually work up to solid foods. ° °You may eat anything you prefer, but it is better to start with liquids, then soup and crackers, and gradually work up to solid foods. ° ° °3) Please notify your doctor immediately if you have any unusual bleeding, trouble breathing, redness and pain at the surgery site, drainage, fever, or pain not relieved by medication. ° ° ° °4) Additional Instructions: ° ° ° ° ° ° ° °Please contact your physician with any problems or Same Day Surgery at [phone], Monday through Friday 6 am to 4 pm, or [facility] [place] [facility] Main number at [phone]. °

## 2018-09-21 NOTE — Brief Op Note (Signed)
09/21/2018  10:46 AM  PATIENT:  Sarah Moran  38 y.o. female  PRE-OPERATIVE DIAGNOSIS:  SYMPTOMATIC CHOLELITHIASIS  POST-OPERATIVE DIAGNOSIS:  SYMPTOMATIC CHOLELITHIASIS  PROCEDURE:  Procedure(s): LAPAROSCOPIC CHOLECYSTECTOMY (N/A)  SURGEON:  Surgeon(s) and Role:    * Fredirick Maudlin, MD - Primary  PHYSICIAN ASSISTANT:   ASSISTANTS: none   ANESTHESIA:   general  EBL:  5 mL   BLOOD ADMINISTERED:none  DRAINS: none   LOCAL MEDICATIONS USED:  OTHER 1:1 mixture of 0.25% bupivicaine and 1% lidocaine w/epi  SPECIMEN:  Source of Specimen:  gallbladder  DISPOSITION OF SPECIMEN:  PATHOLOGY  COUNTS:  YES  TOURNIQUET:  * No tourniquets in log *  DICTATION: .Note written in EPIC  PLAN OF CARE: Discharge to home after PACU  PATIENT DISPOSITION:  PACU - hemodynamically stable.   Delay start of Pharmacological VTE agent (>24hrs) due to surgical blood loss or risk of bleeding: not applicable

## 2018-09-21 NOTE — Anesthesia Preprocedure Evaluation (Addendum)
Anesthesia Evaluation  Patient identified by MRN, date of birth, ID band Patient awake    Reviewed: Allergy & Precautions, H&P , NPO status , Patient's Chart, lab work & pertinent test results  Airway Mallampati: I  TM Distance: >3 FB Neck ROM: full    Dental  (+) Teeth Intact   Pulmonary neg pulmonary ROS, neg COPD,           Cardiovascular (-) angina(-) Past MI and (-) Cardiac Stents negative cardio ROS  (-) dysrhythmias      Neuro/Psych  Headaches, Anxiety negative neurological ROS  negative psych ROS   GI/Hepatic negative GI ROS, Neg liver ROS, GERD  ,  Endo/Other  negative endocrine ROSHypothyroidism   Renal/GU negative Renal ROS  negative genitourinary   Musculoskeletal   Abdominal   Peds  Hematology negative hematology ROS (+)   Anesthesia Other Findings Jehovah's witness.  No blood products, will accept albumin 5%.  Past Medical History: No date: Allergy No date: Chronic pelvic pain in female No date: Family history of breast cancer     Comment:  My Risk neg 5/18 No date: Frequent headaches No date: Gallstone 01/2017: Genetic testing of female     Comment:  My Risk/BRCA neg No date: GERD (gastroesophageal reflux disease)     Comment:  well controlled. Takes gingerroot when symptoms No date: Hypothyroidism     Comment:  s/p thyroidectomy - levothyroxine 75 mcg daily No date: IBS (irritable bowel syndrome)     Comment:  constipation - takes miralax daily 01/2017: Increased risk of breast cancer     Comment:  IBIS=18.8%/riskscore=21.9% 04/2018: Liver cyst No date: Migraine     Comment:  takes excedrin. She has tried imitrex in the past  No date: Personal history of radiation therapy 2007: Thyroid cancer ([place])     Comment:  s/p thyroidectomy  Past Surgical History: 1997: BACK SURGERY     Comment:  scoliosis No date: COLONOSCOPY 1990: HERNIA REPAIR     Comment:  bilateral inguinal 2007:  THYROIDECTOMY     Comment:  thyroid cancer 2008: TOTAL ABDOMINAL HYSTERECTOMY     Comment:  dymenorrhea     Reproductive/Obstetrics negative OB ROS                            Anesthesia Physical  Anesthesia Plan  ASA: II  Anesthesia Plan: General ETT   Post-op Pain Management:    Induction:   PONV Risk Score and Plan: Ondansetron, Dexamethasone, Midazolam and Treatment may vary due to age or medical condition  Airway Management Planned: Natural Airway and Nasal Cannula  Additional Equipment:   Intra-op Plan:   Post-operative Plan:   Informed Consent: I have reviewed the patients History and Physical, chart, labs and discussed the procedure including the risks, benefits and alternatives for the proposed anesthesia with the patient or authorized representative who has indicated his/her understanding and acceptance.   Dental Advisory Given  Plan Discussed with: Anesthesiologist, CRNA and Surgeon  Anesthesia Plan Comments:         Anesthesia Quick Evaluation

## 2018-09-21 NOTE — Interval H&P Note (Signed)
History and Physical Interval Note:  09/21/2018 8:30 AM  Sarah Moran  has presented today for surgery, with the diagnosis of SYMPTOMATIC CHOLELITHIASIS  The various methods of treatment have been discussed with the patient and family. After consideration of risks, benefits and other options for treatment, the patient has consented to  Procedure(s): LAPAROSCOPIC CHOLECYSTECTOMY (N/A) as a surgical intervention .  The patient's history has been reviewed, patient examined, no change in status, stable for surgery.  I have reviewed the patient's chart and labs.  Questions were answered to the patient's satisfaction.     Fredirick Maudlin

## 2018-09-21 NOTE — Anesthesia Procedure Notes (Signed)
Procedure Name: Intubation Date/Time: 09/21/2018 9:33 AM Performed by: Fredderick Phenix, CRNA Pre-anesthesia Checklist: Patient identified, Emergency Drugs available, Suction available, Patient being monitored and Timeout performed Patient Re-evaluated:Patient Re-evaluated prior to induction Oxygen Delivery Method: Circle system utilized Preoxygenation: Pre-oxygenation with 100% oxygen Induction Type: IV induction Ventilation: Mask ventilation without difficulty Laryngoscope Size: Mac and 3 Grade View: Grade I Tube type: Oral Tube size: 7.0 mm Number of attempts: 1 Airway Equipment and Method: Stylet Placement Confirmation: ETT inserted through vocal cords under direct vision,  positive ETCO2,  CO2 detector and breath sounds checked- equal and bilateral Secured at: 21 cm Tube secured with: Tape

## 2018-09-21 NOTE — Transfer of Care (Signed)
Immediate Anesthesia Transfer of Care Note  Patient: Sarah Moran  Procedure(s) Performed: LAPAROSCOPIC CHOLECYSTECTOMY (N/A )  Patient Location: PACU  Anesthesia Type:General  Level of Consciousness: awake, alert  and oriented  Airway & Oxygen Therapy: Patient Spontanous Breathing and Patient connected to face mask oxygen  Post-op Assessment: Report given to RN  Post vital signs: Reviewed and stable  Last Vitals:  Vitals Value Taken Time  BP 114/61 09/21/2018 10:46 AM  Temp 36.6 C 09/21/2018 10:46 AM  Pulse 61 09/21/2018 10:50 AM  Resp 15 09/21/2018 10:50 AM  SpO2 100 % 09/21/2018 10:50 AM  Vitals shown include unvalidated device data.  Last Pain:  Vitals:   09/21/18 1046  TempSrc:   PainSc: 6          Complications: No apparent anesthesia complications

## 2018-09-21 NOTE — Anesthesia Postprocedure Evaluation (Signed)
Anesthesia Post Note  Patient: Sarah Moran  Procedure(s) Performed: LAPAROSCOPIC CHOLECYSTECTOMY (N/A )  Patient location during evaluation: PACU Anesthesia Type: General Level of consciousness: awake and alert Pain management: pain level controlled Vital Signs Assessment: post-procedure vital signs reviewed and stable Respiratory status: spontaneous breathing, nonlabored ventilation, respiratory function stable and patient connected to nasal cannula oxygen Cardiovascular status: blood pressure returned to baseline and stable Postop Assessment: no apparent nausea or vomiting Anesthetic complications: no     Last Vitals:  Vitals:   09/21/18 1222 09/21/18 1248  BP: (!) 123/59 121/68  Pulse: (!) 53 61  Resp: 16 16  Temp: 36.6 C   SpO2: 100% 100%    Last Pain:  Vitals:   09/21/18 1248  TempSrc:   PainSc: [name]

## 2018-09-21 NOTE — Anesthesia Post-op Follow-up Note (Signed)
Anesthesia QCDR form completed.        

## 2018-09-21 NOTE — OR Nursing (Signed)
Dr Celine Ahr and Dr Ola Spurr reviewed  with patient desires in regards to blood products, consent signed for refusal of blood products but patient states " I will accept albumin products that contain 5% or less.

## 2018-09-21 NOTE — Op Note (Signed)
Laparoscopic Cholecystectomy  Pre-operative Diagnosis: symptomatic cholelithiasis  Post-operative Diagnosis: same  Procedure: laparoscopic cholecystectomy  Surgeon: Fredirick Maudlin, MD  Anesthesia: GETA  Assistant:non   Findings: No inflammation or other signs of acute cholecystitis   Estimated Blood Loss: <5cc         Drains: non         Specimens: Gallbladder           Complications: none   Procedure Details  The patient was seen again in the preoperative holding area. The benefits, complications, treatment options, and expected outcomes were discussed with the patient. The risks of bleeding, infection, recurrence of symptoms, failure to resolve symptoms, bile duct damage, bile duct leak, retained common bile duct stone, bowel injury, any of which could require further surgery and/or ERCP, stent, or papillotomy were reviewed with the patient. The likelihood of improving the patient's symptoms with return to their baseline status is good.  The patient and/or family concurred with the proposed plan, giving informed consent.  The patient was taken to operating room, identified as [employer] and the procedure verified as Laparoscopic Cholecystectomy. A time out was performed and the above information confirmed.  Prior to the induction of general anesthesia, antibiotic prophylaxis was administered. VTE prophylaxis was in place. General endotracheal anesthesia was then administered and tolerated well. After the induction, the abdomen was prepped with Chloraprep and draped in the sterile fashion. The patient was positioned in the supine position.  Cut down technique was used to enter the abdominal cavity and a Hasson trochar was placed after two vicryl stitches were anchored to the fascia. Pneumoperitoneum was then created with CO2 and tolerated well without any adverse changes in the patient's vital signs.  Three 5-mm ports were placed in the right upper quadrant all under direct  vision. All skin incisions  were infiltrated with a local anesthetic agent before making the incision and placing the trocars.   The patient was positioned  in reverse Trendelenburg, tilted slightly to the patient's left.  The gallbladder was identified, the fundus grasped and retracted cephalad. Adhesions were lysed bluntly. The infundibulum was grasped and retracted laterally, exposing the peritoneum overlying the triangle of Calot. This was then divided and exposed in a blunt fashion. An extended critical view of the cystic duct and cystic artery was obtained.  The cystic duct was clearly identified and bluntly dissected free. Both the cystic artery and duct were double clipped and divided.  The gallbladder was taken from the gallbladder fossa in a retrograde fashion with the electrocautery. The gallbladder was removed and placed in an Endocatch bag. The liver bed was irrigated and inspected. Hemostasis was achieved with the electrocautery. Copious saline irrigation was utilized and was repeatedly aspirated until clear.  The gallbladder and Endocatch sac were then removed through a port site.     Inspection of the right upper quadrant was performed. No bleeding, bile duct injury or leak, or bowel injury was noted. Pneumoperitoneum was released.  The periumbilical port site was closed with interrumpted 0 Vicryl sutures. 4-0 subcuticular Monocryl was used to close the skin. Dermabond was applied.  The patient was then extubated and brought to the recovery room in stable condition. Sponge, lap, and needle counts were correct at closure and at the conclusion of the case.               Fredirick Maudlin, MD, FACS

## 2018-09-24 ENCOUNTER — Other Ambulatory Visit: Payer: Self-pay

## 2018-09-24 ENCOUNTER — Encounter: Payer: Self-pay | Admitting: Gastroenterology

## 2018-09-24 ENCOUNTER — Telehealth: Payer: Self-pay

## 2018-09-24 DIAGNOSIS — K5909 Other constipation: Secondary | ICD-10-CM

## 2018-09-24 LAB — SURGICAL PATHOLOGY

## 2018-09-24 NOTE — Telephone Encounter (Signed)
Referral to [facility] has been entered

## 2018-09-24 NOTE — Progress Notes (Signed)
Referral has been ordered

## 2018-09-26 ENCOUNTER — Encounter: Payer: Self-pay | Admitting: Gastroenterology

## 2018-09-28 ENCOUNTER — Ambulatory Visit (INDEPENDENT_AMBULATORY_CARE_PROVIDER_SITE_OTHER): Payer: BLUE CROSS/BLUE SHIELD | Admitting: Obstetrics and Gynecology

## 2018-09-28 ENCOUNTER — Encounter: Payer: Self-pay | Admitting: Obstetrics and Gynecology

## 2018-09-28 VITALS — BP 104/60 | HR 73 | Ht 60.0 in | Wt 135.0 lb

## 2018-09-28 DIAGNOSIS — Z4889 Encounter for other specified surgical aftercare: Secondary | ICD-10-CM | POA: Diagnosis not present

## 2018-09-28 DIAGNOSIS — N816 Rectocele: Secondary | ICD-10-CM

## 2018-09-28 DIAGNOSIS — K5909 Other constipation: Secondary | ICD-10-CM | POA: Diagnosis not present

## 2018-09-28 NOTE — Progress Notes (Signed)
Patient ID: Sarah Moran, female   DOB: 01-10-1981, 38 y.o.   MRN: [id number]  Reason for Consult: Follow-up (8 weeks sense surgery)   Referred by Leone Haven, MD  Subjective:     HPI:  Sarah Moran is a 38 y.o. female [place] is following up today about her constipation.  After a difficult visit to the ER after her last visit on 1227 she reports that she has had an improvement in her bowel movements.  She is taking smooth move tea every 3 days as well as daily Linzess.  She reports that she often uses her fingers to push on her rectum on either side of the rectal sphincter.  When she applies this pressure to the outside of the rectum she is able to have regular bowel movements.  She is following with GI and has been referred to a GI specialist for motility.  She has not been able to schedule this visit yet with a motility expert.  She recently had her gallbladder removed.  She has not been sexually active.  She is having no issues with her vagina. She is doing daily toilet time and using the squatty potty.    Past Medical History:  Diagnosis Date  . Allergy   . Anxiety    no meds  . Chronic pelvic pain in female   . Family history of breast cancer    My Risk neg 5/18  . Frequent headaches   . Gallstone   . Gallstones   . Genetic testing of female 01/2017   My Risk/BRCA neg  . GERD (gastroesophageal reflux disease)    well controlled. Takes gingerroot when symptoms  . Hypothyroidism    s/p thyroidectomy - levothyroxine 75 mcg daily  . IBS (irritable bowel syndrome)    constipation - takes miralax daily  . Increased risk of breast cancer 01/2017   IBIS=18.8%/riskscore=21.9%  . Liver cyst 04/2018  . Migraine    takes excedrin. She has tried imitrex in the past   . Personal history of radiation therapy   . Seizure ([place]) 1997    x 1-age 57-after surgery for scoliosis  . Thyroid cancer ([place]) 2007   s/p thyroidectomy   Family History  Problem Relation Age of Onset    . Hyperlipidemia Father   . Hypertension Father   . Clotting disorder Sister   . Hepatitis B Brother   . Cancer Paternal Grandmother 46       breast cancer? with mets  . Breast cancer Paternal Grandmother 8  . Cancer Mother        Cervical Cancer  . Cancer Maternal Aunt        throat cancer  . Breast cancer Paternal Aunt        41  . Breast cancer Paternal Aunt        55  . Breast cancer Paternal Aunt        25   Past Surgical History:  Procedure Laterality Date  . ANTERIOR AND POSTERIOR REPAIR WITH SACROSPINOUS FIXATION N/A 08/02/2018   Procedure: ANTERIOR AND POSTERIOR REPAIR;  Surgeon: Homero Fellers, MD;  Location: [facility] ORS;  Service: Gynecology;  Laterality: N/A;  . BACK SURGERY  1997   scoliosis  . CHOLECYSTECTOMY N/A 09/21/2018   Procedure: LAPAROSCOPIC CHOLECYSTECTOMY;  Surgeon: Fredirick Maudlin, MD;  Location: [facility] ORS;  Service: General;  Laterality: N/A;  . COLONOSCOPY    . ESOPHAGOGASTRODUODENOSCOPY (EGD) WITH PROPOFOL N/A 06/12/2018   Procedure: ESOPHAGOGASTRODUODENOSCOPY (EGD) WITH PROPOFOL;  Surgeon: Lin Landsman, MD;  Location: [facility] END[facility];  Service: Gastroenterology;  Laterality: N/A;  . HERNIA REPAIR  1990   bilateral inguinal  . PUBOVAGINAL SLING N/A 08/02/2018   Procedure: PUBO-VAGINAL SLING- TVT;  Surgeon: Homero Fellers, MD;  Location: [facility] ORS;  Service: Gynecology;  Laterality: N/A;  . THYROIDECTOMY  2007   thyroid cancer  . TOTAL ABDOMINAL HYSTERECTOMY  2008   dymenorrhea    Short Social History:  Social History   Tobacco Use  . Smoking status: Never Smoker  . Smokeless tobacco: Never Used  Substance Use Topics  . Alcohol use: Yes    Comment: Occasionally - very rarely    Allergies  Allergen Reactions  . Darvon [Propoxyphene] Other (See Comments)    Hallucinations   . Onion Other (See Comments)    RAW-MIGRAINES  . Percocet [Oxycodone-Acetaminophen] Hives  . Vicodin [Hydrocodone-Acetaminophen] Hives     Current Outpatient Medications  Medication Sig Dispense Refill  . Artificial Tear Solution (SOOTHE XP) SOLN Place 1 drop into both eyes daily as needed (dry eyes).    . cetirizine (ZYRTEC) 10 MG tablet Take 10 mg by mouth at bedtime.     . fluticasone (FLONASE) 50 MCG/ACT nasal spray Place 2 sprays into both nostrils daily as needed.     Marland Kitchen ibuprofen (ADVIL,MOTRIN) 800 MG tablet Take 800 mg by mouth every 8 (eight) hours as needed (pain).    Marland Kitchen linaclotide (LINZESS) 290 MCG CAPS capsule Take 290 mcg by mouth 2 (two) times daily.    . SUMAtriptan (IMITREX) 50 MG tablet TAKE ONE TABLET BY MOUTH AT ONSET OF MIGRAINE - MAY REPEAT DOSE IN 2 HOURS IF HEADACHE PERSISTS OR RECURS (Patient taking differently: Take 50 mg by mouth every 2 (two) hours as needed for migraine. ) 10 tablet 11  . SYNTHROID 75 MCG tablet TAKE 1 TABLET (75 MCG TOTAL) BY MOUTH DAILY BEFORE BREAKFAST. 90 tablet 1  . acetaminophen-codeine (TYLENOL #3) 300-30 MG tablet Take by mouth every 4 (four) hours as needed for moderate pain.    . Biotin 2500 MCG CAPS Take 1 capsule by mouth daily.    . Cholecalciferol (VITAMIN D3) 50 MCG (2000 UT) capsule Take 2,000 Units by mouth daily.    Marland Kitchen Cod Liver Oil CAPS Take 1 capsule by mouth at bedtime.     Noelle Penner FIBER SUPPLEMENT PO Take 2 tablets by mouth 3 (three) times daily.    . Omega-3 Fatty Acids (FISH OIL) 1200 MG CAPS Take 1 capsule by mouth daily.     . vitamin C (ASCORBIC ACID) 500 MG tablet Take 500 mg by mouth daily.     No current facility-administered medications for this visit.     Review of Systems  Constitutional: Negative for chills, fatigue, fever and unexpected weight change.  HENT: Negative for trouble swallowing.  Eyes: Negative for loss of vision.  Respiratory: Negative for cough, shortness of breath and wheezing.  Cardiovascular: Negative for chest pain, leg swelling, palpitations and syncope.  GI: Positive for rectal pain. Negative for abdominal pain, blood in stool,  diarrhea, nausea and vomiting.       +constipation GU: Negative for difficulty urinating, dysuria, frequency and hematuria.  Musculoskeletal: Negative for back pain, leg pain and joint pain.  Skin: Negative for rash.  Neurological: Negative for dizziness, headaches, light-headedness, numbness and seizures.  Psychiatric: Negative for behavioral problem, confusion, depressed mood and sleep disturbance.        Objective:  Objective   Vitals:  09/28/18 0812  BP: 104/60  Pulse: 73  Weight: 135 lb (61.2 kg)  Height: 5' (1.524 m)   Body mass index is 26.37 kg/m.  Physical Exam Vitals signs and nursing note reviewed.  Constitutional:      Appearance: She is well-developed.  HENT:     Head: Normocephalic and atraumatic.  Eyes:     Pupils: Pupils are equal, round, and reactive to light.  Cardiovascular:     Rate and Rhythm: Normal rate and regular rhythm.  Pulmonary:     Effort: Pulmonary effort is normal. No respiratory distress.  Abdominal:     General: Abdomen is flat. There is no distension.     Palpations: Abdomen is soft.  Genitourinary:    Comments: Anterior and posterior repairs intact and healing well.  Skin:    General: Skin is warm and dry.  Neurological:     Mental Status: She is alert and oriented to person, place, and time.  Psychiatric:        Behavior: Behavior normal.        Thought Content: Thought content normal.        Judgment: Judgment normal.    No fissures , tear or lacerations noted on the exterior rectum. Small, non inflamed external hemorrhoids.      Assessment/Plan:     38 yo s/p anterior and posterior repair. Feeling better. Continue current bowel regimen. Encouraged her to call the motility specialist for an appointment.  No sexual activity for 4 more weeks. Follow up in 6 weeks after she is sexually active to see how she is doing.   More than 25 minutes were spent face to face with the patient in the room with more than 50% of the time  spent providing counseling and discussing the plan of management.   Adrian Prows MD [facility] 10/01/2018 4:03 PM

## 2018-10-01 ENCOUNTER — Other Ambulatory Visit: Payer: Self-pay

## 2018-10-01 ENCOUNTER — Telehealth: Payer: Self-pay | Admitting: Gastroenterology

## 2018-10-01 DIAGNOSIS — K5909 Other constipation: Secondary | ICD-10-CM

## 2018-10-01 NOTE — Telephone Encounter (Signed)
Onesha from centralized scheduling states she just scheduled an appt with someone with this pt with Dr.Nandigam at [place] and she needs a cb to resch.

## 2018-10-03 ENCOUNTER — Other Ambulatory Visit: Payer: Self-pay

## 2018-10-03 NOTE — Telephone Encounter (Signed)
Patient is now scheduled for 10/12/2018 anal manometry at [facility]. Written instructions mailed to the patient. She also has an active My Chart to view her instructions.

## 2018-10-04 ENCOUNTER — Ambulatory Visit (INDEPENDENT_AMBULATORY_CARE_PROVIDER_SITE_OTHER): Payer: BLUE CROSS/BLUE SHIELD | Admitting: General Surgery

## 2018-10-04 ENCOUNTER — Encounter: Payer: Self-pay | Admitting: Gastroenterology

## 2018-10-04 ENCOUNTER — Other Ambulatory Visit: Payer: Self-pay

## 2018-10-04 ENCOUNTER — Encounter: Payer: Self-pay | Admitting: General Surgery

## 2018-10-04 VITALS — BP 94/67 | HR 80 | Temp 97.3°F | Resp 16 | Ht 65.0 in | Wt 137.0 lb

## 2018-10-04 DIAGNOSIS — Z9049 Acquired absence of other specified parts of digestive tract: Secondary | ICD-10-CM

## 2018-10-04 NOTE — Patient Instructions (Signed)

## 2018-10-04 NOTE — Progress Notes (Signed)
Sarah Moran returns to clinic today for a postoperative visit after undergoing an uncomplicated laparoscopic cholecystectomy.  She states that overall she has been doing well.  She has had some discomfort around her periumbilical incision.  She did have diarrhea after eating fatty foods, however she states that this is actually an improvement as she is normally constipated.  She denies fevers, chills, nausea, vomiting.  Pathology was benign.    Vitals:   10/04/18 0846  BP: 94/67  Pulse: 80  Resp: 16  Temp: (!) 97.3 F (36.3 C)  SpO2: 100%   Focused examination: The Steri-Strips were still intact.  These were removed.  There is skin separation at the 2 right lateral port sites.  There is no induration erythema or purulent drainage present.  There is a slight skin separation at the umbilical site.  I reapplied Mastisol and Steri-Strips to each area of skin separation.  Impression and plan: Sarah Moran is a 38 year old woman who underwent an uncomplicated laparoscopic cholecystectomy.  She is doing well.  She was advised to maintain her lifting restriction for another 3 to 4 weeks.  She may resume driving and all of her usual activities.  She will contact us if there are any further concerns regarding her incision sites.  We will otherwise see her on an as-needed basis.

## 2018-10-11 ENCOUNTER — Encounter: Payer: Self-pay | Admitting: Gastroenterology

## 2018-10-11 NOTE — Progress Notes (Signed)
Spoke with patient about appointment.  Instructed to arrive between 9am and 9:15.  Patient denied any questions or concerns.

## 2018-10-12 ENCOUNTER — Encounter (HOSPITAL_COMMUNITY)
Admission: RE | Disposition: A | Discharge status: Home / Self Care | Payer: Self-pay | Source: Home / Self Care | Attending: Gastroenterology

## 2018-10-12 ENCOUNTER — Encounter: Payer: Self-pay | Admitting: Neurology

## 2018-10-12 ENCOUNTER — Ambulatory Visit: Payer: BLUE CROSS/BLUE SHIELD | Admitting: Neurology

## 2018-10-12 ENCOUNTER — Ambulatory Visit (HOSPITAL_COMMUNITY)
Admission: RE | Admit: 2018-10-12 | Discharge: 2018-10-12 | Disposition: A | Discharge status: Home / Self Care | Payer: BLUE CROSS/BLUE SHIELD | Attending: Gastroenterology | Admitting: Gastroenterology

## 2018-10-12 VITALS — BP 110/60 | HR 82 | Ht 60.0 in | Wt 135.1 lb

## 2018-10-12 DIAGNOSIS — K5900 Constipation, unspecified: Secondary | ICD-10-CM | POA: Diagnosis not present

## 2018-10-12 DIAGNOSIS — R202 Paresthesia of skin: Secondary | ICD-10-CM

## 2018-10-12 HISTORY — PX: ANAL RECTAL MANOMETRY: SHX6358

## 2018-10-12 SURGERY — MANOMETRY, ANORECTAL

## 2018-10-12 NOTE — Progress Notes (Signed)
[facility] Clinic Note - Initial Visit   Date: 10/12/18  Sarah Moran MRN: [id number] DOB: 01-27-1981   Dear Dr. Caryl Bis:  Thank you for your kind referral of Sarah Moran for consultation of disturbance of skin sensation. Although her history is well known to you, please allow [place] to reiterate it for the purpose of our medical record. The patient was accompanied to the clinic by husband who also provides collateral information.     History of Present Illness: Sarah Moran is a 38 y.o. right-handed Caucasian female with depression, hypothyroidism, and migraines presenting for evaluation of disturbance of skin sensation.  She has had spells of numbness/tingling of the fingers, especially on the left hand for the past 8-10 years. She injured her right arm after dislocating the elbow and developed carpal tunnel syndrome afterwards.  She does not use a wrist splint and symptoms are not bothersome.  She became more concerned in August 2019, after having numbness/tingling of the entire arms and legs, which is present only when laying down or with sitting for prolonged periods.  If she repositions, symptoms quickly resolve.  She denies any weakness or the arms/legs or falls.  She also feels that her legs are cold to touch.  She has no numbness/tingling when sleeping on a heating pad.  She saw her PCP who checked TSH, vitamin B12, and HbA1c which was normal.   Out-side paper records, electronic medical record, and images have been reviewed where available and summarized as:  Lab Results  Component Value Date   TSH 4.50 04/23/2018   Lab Results  Component Value Date   HGBA1C 5.6 04/12/2018   Lab Results  Component Value Date   VITAMINB12 345 04/23/2018    Past Medical History:  Diagnosis Date  . Allergy   . Anxiety    no meds  . Chronic pelvic pain in female   . Family history of breast cancer    My Risk neg 5/18  . Frequent headaches   .  Gallstone   . Gallstones   . Genetic testing of female 01/2017   My Risk/BRCA neg  . GERD (gastroesophageal reflux disease)    well controlled. Takes gingerroot when symptoms  . Hypothyroidism    s/p thyroidectomy - levothyroxine 75 mcg daily  . IBS (irritable bowel syndrome)    constipation - takes miralax daily  . Increased risk of breast cancer 01/2017   IBIS=18.8%/riskscore=21.9%  . Liver cyst 04/2018  . Migraine    takes excedrin. She has tried imitrex in the past   . Personal history of radiation therapy   . Seizure ([place]) 1997    x 1-age 29-after surgery for scoliosis  . Thyroid cancer ([place]) 2007   s/p thyroidectomy    Past Surgical History:  Procedure Laterality Date  . ANTERIOR AND POSTERIOR REPAIR WITH SACROSPINOUS FIXATION N/A 08/02/2018   Procedure: ANTERIOR AND POSTERIOR REPAIR;  Surgeon: Homero Fellers, MD;  Location: [facility];  Service: Gynecology;  Laterality: N/A;  . BACK SURGERY  1997   scoliosis  . CHOLECYSTECTOMY N/A 09/21/2018   Procedure: LAPAROSCOPIC CHOLECYSTECTOMY;  Surgeon: Fredirick Maudlin, MD;  Location: [facility];  Service: General;  Laterality: N/A;  . COLONOSCOPY    . ESOPHAGOGASTRODUODENOSCOPY (EGD) WITH PROPOFOL N/A 06/12/2018   Procedure: ESOPHAGOGASTRODUODENOSCOPY (EGD) WITH PROPOFOL;  Surgeon: Lin Landsman, MD;  Location: [facility];  Service: Gastroenterology;  Laterality: N/A;  . HERNIA REPAIR  1990   bilateral inguinal  . PUBOVAGINAL  SLING N/A 08/02/2018   Procedure: PUBO-VAGINAL SLING- TVT;  Surgeon: Homero Fellers, MD;  Location: [facility];  Service: Gynecology;  Laterality: N/A;  . THYROIDECTOMY  2007   thyroid cancer  . TOTAL ABDOMINAL HYSTERECTOMY  2008   dymenorrhea     Medications:  Outpatient Encounter Medications as of 10/12/2018  Medication Sig Note  . Artificial Tear Solution (SOOTHE XP) SOLN Place 1 drop into both eyes daily as needed (dry eyes).   . Biotin 2500 MCG CAPS Take 1 capsule by mouth daily.    . cetirizine (ZYRTEC) 10 MG tablet Take 10 mg by mouth at bedtime.    . Cholecalciferol (VITAMIN D3) 50 MCG (2000 UT) capsule Take 2,000 Units by mouth daily.   Marland Kitchen Cod Liver Oil CAPS Take 1 capsule by mouth at bedtime.    Noelle Penner FIBER SUPPLEMENT PO Take 2 tablets by mouth 3 (three) times daily. 09/17/2018: ON HOLD   . fluticasone (FLONASE) 50 MCG/ACT nasal spray Place 2 sprays into both nostrils daily as needed.    Marland Kitchen ibuprofen (ADVIL,MOTRIN) 800 MG tablet Take 800 mg by mouth every 8 (eight) hours as needed (pain).   Marland Kitchen linaclotide (LINZESS) 290 MCG CAPS capsule Take 290 mcg by mouth 2 (two) times daily.   . Omega-3 Fatty Acids (FISH OIL) 1200 MG CAPS Take 1 capsule by mouth daily.    . SUMAtriptan (IMITREX) 50 MG tablet TAKE ONE TABLET BY MOUTH AT ONSET OF MIGRAINE - MAY REPEAT DOSE IN 2 HOURS IF HEADACHE PERSISTS OR RECURS (Patient taking differently: Take 50 mg by mouth every 2 (two) hours as needed for migraine. )   . SYNTHROID 75 MCG tablet TAKE 1 TABLET (75 MCG TOTAL) BY MOUTH DAILY BEFORE BREAKFAST.   Marland Kitchen vitamin C (ASCORBIC ACID) 500 MG tablet Take 500 mg by mouth daily.    No facility-administered encounter medications on file as of 10/12/2018.      Allergies:  Allergies  Allergen Reactions  . Darvon [Propoxyphene] Other (See Comments)    Hallucinations   . Onion Other (See Comments)    RAW-MIGRAINES  . Percocet [Oxycodone-Acetaminophen] Hives  . Vicodin [Hydrocodone-Acetaminophen] Hives    Family History: Family History  Problem Relation Age of Onset  . Hyperlipidemia Father   . Hypertension Father   . Clotting disorder Sister   . Hepatitis B Brother   . Cancer Paternal Grandmother 82       breast cancer? with mets  . Breast cancer Paternal Grandmother 69  . Cancer Mother        Cervical Cancer  . Cancer Maternal Aunt        throat cancer  . Breast cancer Paternal Aunt        53  . Breast cancer Paternal Aunt        35  . Breast cancer Paternal Aunt        53     Social History: Social History   Tobacco Use  . Smoking status: Never Smoker  . Smokeless tobacco: Never Used  Substance Use Topics  . Alcohol use: Yes    Comment: Occasionally - very rarely  . Drug use: No   Social History   Social History Narrative   Stephanne grew up in [place]. She currently lives in [place] with her husband, Audelia Acton, and their 2 sons ([place]  and [place] ). 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 Tyanne belongs to the [employer]  faith. She enjoys outdoor activities on her spare time.    Review of Systems:  CONSTITUTIONAL: No fevers, chills, night sweats, or weight loss.   EYES: No visual changes or eye pain ENT: No hearing changes.  No history of nose bleeds.   RESPIRATORY: No cough, wheezing and shortness of breath.   CARDIOVASCULAR: Negative for chest pain, and palpitations.   GI: Negative for abdominal discomfort, blood in stools or black stools.  No recent change in bowel habits.   GU:  No history of incontinence.   MUSCLOSKELETAL: No history of joint pain or swelling.  No myalgias.   SKIN: Negative for lesions, rash, and itching.   HEMATOLOGY/ONCOLOGY: Negative for prolonged bleeding, bruising easily, and swollen nodes.  No history of cancer.   ENDOCRINE: Negative for cold or heat intolerance, polydipsia or goiter.   PSYCH:  No depression or anxiety symptoms.   NEURO: As Above.   Vital Signs:  BP 110/60   Pulse 82   Ht 5' (1.524 m)   Wt 135 lb 2 oz (61.3 kg)   SpO2 99%   BMI 26.39 kg/m   General Medical Exam:   General:  Well appearing, comfortable.   Eyes/ENT: see cranial nerve examination.   Neck: No masses appreciated.  Full range of motion without tenderness.  No carotid bruits. Respiratory:  Clear to auscultation, good air entry bilaterally.   Cardiac:  Regular rate and rhythm, no murmur.   Extremities:  No deformities, edema, or skin discoloration.  Skin:  No rashes or  lesions.  Neurological Exam: MENTAL STATUS including orientation to time, place, person, recent and remote memory, attention span and concentration, language, and fund of knowledge is normal.  Speech is not dysarthric.  CRANIAL NERVES: II:  No visual field defects.  Unremarkable fundi.   III-IV-VI: Pupils equal round and reactive to light.  Normal conjugate, extra-ocular eye movements in all directions of gaze.  No nystagmus.  No ptosis.   V:  Normal facial sensation   VII:  Normal facial symmetry and movements.    VIII:  Normal hearing and vestibular function.   IX-X:  Normal palatal movement.   XI:  Normal shoulder shrug and head rotation.   XII:  Normal tongue strength and range of motion, no deviation or fasciculation.  MOTOR:  No atrophy, fasciculations or abnormal movements.  No pronator drift.  Tone is normal.    Right Upper Extremity:    Left Upper Extremity:    Deltoid  5/5   Deltoid  5/5   Biceps  5/5   Biceps  5/5   Triceps  5/5   Triceps  5/5   Wrist extensors  5/5   Wrist extensors  5/5   Wrist flexors  5/5   Wrist flexors  5/5   Finger extensors  5/5   Finger extensors  5/5   Finger flexors  5/5   Finger flexors  5/5   Dorsal interossei  5/5   Dorsal interossei  5/5   Abductor pollicis  5/5   Abductor pollicis  5/5   Tone (Ashworth scale)  0  Tone (Ashworth scale)  0   Right Lower Extremity:    Left Lower Extremity:    Hip flexors  5/5   Hip flexors  5/5   Hip extensors  5/5   Hip extensors  5/5   Knee flexors  5/5   Knee flexors  5/5   Knee extensors  5/5   Knee extensors  5/5   Dorsiflexors  5/5   Dorsiflexors  5/5   Plantarflexors  5/5   Plantarflexors  5/5   Toe extensors  5/5   Toe extensors  5/5   Toe flexors  5/5   Toe flexors  5/5   Tone (Ashworth scale)  0  Tone (Ashworth scale)  0   MSRs:  Right                                                                 Left brachioradialis 2+  brachioradialis 2+  biceps 2+  biceps 2+  triceps 2+  triceps 2+   patellar 2+  patellar 2+  ankle jerk 2+  ankle jerk 2+  Hoffman no  Hoffman no  plantar response down  plantar response down   SENSORY:  Normal and symmetric perception of light touch, pinprick, vibration, and proprioception. Tinel's sign is negative at the wrists and elbow  COORDINATION/GAIT: Normal finger-to- nose-finger and heel-to-shin.  Intact rapid alternating movements bilaterally.  Gait narrow based and stable. Tandem and stressed gait intact.    IMPRESSION: Episodic paresthesias diffusely over the arms and legs only when laying down and quickly resolved with repositioning or heat application.  Her exam is entirely normal and non-focal.  Patient reassured that I did not see anything worrisome as an explanation.  I offered NCS/EMG of the arms and legs to be sure there is no evidence of nerve pathology, but explained that my overall suspicion is low since her symptoms to do not confirm to a central or peripheral nerve distribution.  She does not wish to proceed with any testing.  If symptoms get worse, she will return for further evaluation.   Thank you for allowing me to participate in patient's care.  If I can answer any additional questions, I would be pleased to do so.    Sincerely,    Donika K. Patel, DO  

## 2018-10-12 NOTE — Progress Notes (Signed)
Anal Rectal manometry done per protocol. Pt tolerated well without distress or complication.

## 2018-10-15 ENCOUNTER — Encounter (HOSPITAL_COMMUNITY): Payer: Self-pay | Admitting: Gastroenterology

## 2018-10-16 DIAGNOSIS — K5900 Constipation, unspecified: Secondary | ICD-10-CM

## 2018-10-19 ENCOUNTER — Encounter: Payer: Self-pay | Admitting: Gastroenterology

## 2018-10-19 ENCOUNTER — Other Ambulatory Visit: Payer: Self-pay

## 2018-10-19 ENCOUNTER — Ambulatory Visit: Payer: BLUE CROSS/BLUE SHIELD | Admitting: Gastroenterology

## 2018-10-19 VITALS — BP 109/74 | HR 76 | Resp 17 | Ht 60.0 in | Wt 137.4 lb

## 2018-10-19 DIAGNOSIS — Z8619 Personal history of other infectious and parasitic diseases: Secondary | ICD-10-CM

## 2018-10-19 DIAGNOSIS — K602 Anal fissure, unspecified: Secondary | ICD-10-CM

## 2018-10-19 DIAGNOSIS — K5909 Other constipation: Secondary | ICD-10-CM | POA: Diagnosis not present

## 2018-10-19 DIAGNOSIS — K581 Irritable bowel syndrome with constipation: Secondary | ICD-10-CM

## 2018-10-19 NOTE — Progress Notes (Signed)
Sarah Darby, MD [address]  [name], [place] [address]  Main: [phone]  Fax: [phone]    Gastroenterology Consultation  Referring Provider:     Leone Haven, MD Primary Care Physician:  Sarah Haven, MD Primary Gastroenterologist:  Sarah Moran Reason for Consultation:     Dyspepsia and chronic constipation        HPI:   Sarah Moran is a 38 y.o. female referred by Sarah Moran, Sarah Adam, MD  for consultation & management of dyspepsia and chronic constipation.  Chronic constipation: suffering from infrequent bowel movements since the delivery of her child, which is more than 10 years. She manages to incorporate more fiber in her diet, takes MiraLAX daily. Fleet enemas as needed. She reports having 1-2 bowel movements daily but associated with incomplete emptying.   Dyspepsia: for the last 6 months, patient has been experiencing right upper quadrant/epigastric discomfort associated with bloating, early satiety. She underwent right upper quadrant Ultrasound which revealed cholelithiasis. Her LFTs are normal. She is started on omeprazole 20 mg once a day. Patient says that she is generally stressful and anxious person, reports having tension knots. She was recommended to see a therapist by her PCP. She has been delaying to see the therapist because she preferred not to   Follow-up visit 06/19/2018 She underwent EGD which revealed H. pylori gastritis, started her on triple therapy.  She tried linaclotide 145 MCG which did not help, currently she is taking 290 MCG.  She continues to have ongoing symptoms of severe constipation.  She also notices bulging of the vagina particularly when she has hard stools associated with significant straining.  She said that she had to push it back after the bowel movement.  She had 2 days of hard bowel movements and she took Fleet enemas which finally helped.  She is on shakes only today, her stools are soft  now.  She says she is drinking more water, coffee in the morning and trying to add more fiber in her diet  Follow-up visit 07/30/18 Patient finished treatment for H pylori.  She reports that linaclotide 290 MCG worked temporarily.  She tried motegrity and develop severe headache after taking 1 pill and caused severe cramps.  She tried Linzess 290 MCG twice daily which helped to move her bowel.  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  Plan to undergo ureteral sling on 08/02/2018.  Follow-up visit 09/05/2018 Patient completed Pylera for recurrent H. pylori infection.  She continues to have intermittent right upper quadrant discomfort radiating to her back.  She does have significant improvement in her epigastric pain.  Patient underwent anterior and posterior repair with bladder sling for stage III rectocele and cystocele on 08/02/2018.  Patient is recovering well from this.  She continues to have severe constipation for which she has been needing Fleet enema since surgery.  She tried linaclotide 290 MCG 2 pills at the time which does help intermittently.  As long as she is not eating solid food, she reports having soft bowel movements.  She incorporates a lot of fiber in smoothies and shakes daily.  She is also scheduled to see general surgery for right axillary lesion on 09/17/2018  Follow-up visit 10/19/2018 She underwent cholecystectomy for dyspepsia symptoms and she felt it did not improve her symptoms.  But the surgery went well.  She underwent anorectal manometry last week and  awaiting results, and to be seen by Sarah Moran.  She had an episode of severe constipation after surgery which required disimpaction as well as using fleets this has resulted in anal fissure, severe sharp stabbing pain during bowel movement, severe spasms and cramps in the rectum.  She has tried Anusol cream with no relief.  She went back on MiraLAX as  suggested by her GYN which seems to be helping.  She does not feel constipated at this time.  She consumes plenty of fiber in her diet  NSAIDs: none  Antiplts/Anticoagulants/Anti thrombotics: none  GI Procedures: colonoscopy approximately 10 years ago  Reportedly normal  Past Medical History:  Diagnosis Date  . Allergy   . Anxiety    no meds  . Chronic pelvic pain in female   . Family history of breast cancer    My Risk neg 5/18  . Frequent headaches   . Gallstone   . Gallstones   . Genetic testing of female 01/2017   My Risk/BRCA neg  . GERD (gastroesophageal reflux disease)    well controlled. Takes gingerroot when symptoms  . Hypothyroidism    s/p thyroidectomy - levothyroxine 75 mcg daily  . IBS (irritable bowel syndrome)    constipation - takes miralax daily  . Increased risk of breast cancer 01/2017   IBIS=18.8%/riskscore=21.9%  . Liver cyst 04/2018  . Migraine    takes excedrin. She has tried imitrex in the past   . Personal history of radiation therapy   . Seizure ([place]) 1997    x 1-age 19-after surgery for scoliosis  . Thyroid cancer ([place]) 2007   s/p thyroidectomy    Past Surgical History:  Procedure Laterality Date  . ANAL RECTAL MANOMETRY N/A 10/12/2018   Procedure: ANO RECTAL MANOMETRY;  Surgeon: Sarah Pole, MD;  Location: [facility];  Service: Endoscopy;  Laterality: N/A;  . ANTERIOR AND POSTERIOR REPAIR WITH SACROSPINOUS FIXATION N/A 08/02/2018   Procedure: ANTERIOR AND POSTERIOR REPAIR;  Surgeon: Sarah Fellers, MD;  Location: [facility];  Service: Gynecology;  Laterality: N/A;  . BACK SURGERY  1997   scoliosis  . CHOLECYSTECTOMY N/A 09/21/2018   Procedure: LAPAROSCOPIC CHOLECYSTECTOMY;  Surgeon: Sarah Maudlin, MD;  Location: [facility];  Service: General;  Laterality: N/A;  . COLONOSCOPY    . ESOPHAGOGASTRODUODENOSCOPY (EGD) WITH PROPOFOL N/A 06/12/2018   Procedure: ESOPHAGOGASTRODUODENOSCOPY (EGD) WITH PROPOFOL;  Surgeon: Sarah Landsman, MD;  Location: [facility];  Service: Gastroenterology;  Laterality: N/A;  . HERNIA REPAIR  1990   bilateral inguinal  . PUBOVAGINAL SLING N/A 08/02/2018   Procedure: PUBO-VAGINAL SLING- TVT;  Surgeon: Sarah Fellers, MD;  Location: [facility];  Service: Gynecology;  Laterality: N/A;  . THYROIDECTOMY  2007   thyroid cancer  . TOTAL ABDOMINAL HYSTERECTOMY  2008   dymenorrhea    Current Outpatient Medications:  .  Artificial Tear Solution (SOOTHE XP) SOLN, Place 1 drop into both eyes daily as needed (dry eyes)., Disp: , Rfl:  .  Biotin 2500 MCG CAPS, Take 1 capsule by mouth daily., Disp: , Rfl:  .  cetirizine (ZYRTEC) 10 MG tablet, Take 10 mg by mouth at bedtime. , Disp: , Rfl:  .  Cholecalciferol (VITAMIN D3) 50 MCG (2000 UT) capsule, Take 2,000 Units by mouth daily., Disp: , Rfl:  .  Cod Liver Oil CAPS, Take 1 capsule by mouth at bedtime. , Disp: , Rfl:  .  EQ FIBER SUPPLEMENT PO, Take 2 tablets by mouth 3 (three) times daily.,  Disp: , Rfl:  .  fluticasone (FLONASE) 50 MCG/ACT nasal spray, Place 2 sprays into both nostrils daily as needed. , Disp: , Rfl:  .  ibuprofen (ADVIL,MOTRIN) 800 MG tablet, Take 800 mg by mouth every 8 (eight) hours as needed (pain)., Disp: , Rfl:  .  linaclotide (LINZESS) 290 MCG CAPS capsule, Take 290 mcg by mouth 2 (two) times daily., Disp: , Rfl:  .  Omega-3 Fatty Acids (FISH OIL) 1200 MG CAPS, Take 1 capsule by mouth daily. , Disp: , Rfl:  .  SUMAtriptan (IMITREX) 50 MG tablet, TAKE ONE TABLET BY MOUTH AT ONSET OF MIGRAINE - MAY REPEAT DOSE IN 2 HOURS IF HEADACHE PERSISTS OR RECURS (Patient taking differently: Take 50 mg by mouth every 2 (two) hours as needed for migraine. ), Disp: 10 tablet, Rfl: 11 .  SYNTHROID 75 MCG tablet, TAKE 1 TABLET (75 MCG TOTAL) BY MOUTH DAILY BEFORE BREAKFAST., Disp: 90 tablet, Rfl: 1 .  vitamin C (ASCORBIC ACID) 500 MG tablet, Take 500 mg by mouth daily., Disp: , Rfl:    Family History  Problem Relation Age of Onset   . Hyperlipidemia Father   . Hypertension Father   . Clotting disorder Sister   . Hepatitis B Brother   . Cancer Paternal Grandmother 56       breast cancer? with mets  . Breast cancer Paternal Grandmother 60  . Cancer Mother        Cervical Cancer  . Cancer Maternal Aunt        throat cancer  . Breast cancer Paternal Aunt        38  . Breast cancer Paternal Aunt        38  . Breast cancer Paternal Aunt        49     Social History   Tobacco Use  . Smoking status: Never Smoker  . Smokeless tobacco: Never Used  Substance Use Topics  . Alcohol use: Yes    Comment: Occasionally - very rarely  . Drug use: No    Allergies as of 10/19/2018 - Review Complete 10/19/2018  Allergen Reaction Noted  . Darvon [propoxyphene] Other (See Comments) 09/23/2013  . Onion Other (See Comments) 07/27/2018  . Percocet [oxycodone-acetaminophen] Hives 09/23/2013  . Vicodin [hydrocodone-acetaminophen] Hives 09/23/2013    Review of Systems:    All systems reviewed and negative except where noted in HPI.   Physical Exam:  BP 109/74 (BP Location: Left Arm, Patient Position: Sitting, Cuff Size: Normal)   Pulse 76   Resp 17   Ht 5' (1.524 m)   Wt 137 lb 6.4 oz (62.3 kg)   SpO2 97%   BMI 26.83 kg/m  No LMP recorded. Patient has had a hysterectomy.  General:   Alert,  Well-developed, well-nourished, pleasant and cooperative in NAD Head:  Normocephalic and atraumatic. Eyes:  Sclera clear, no icterus.   Conjunctiva pink. Ears:  Normal auditory acuity. Nose:  No deformity, discharge, or lesions. Mouth:  No deformity or lesions,oropharynx pink & moist. Neck:  Supple; no masses or thyromegaly. Lungs:  Respirations even and unlabored.  Clear throughout to auscultation.   No wheezes, crackles, or rhonchi. No acute distress. Heart:  Regular rate and rhythm; no murmurs, clicks, rubs, or gallops. Abdomen:  Normal bowel sounds. Soft, non-tender and non-distended without masses, hepatosplenomegaly or  hernias noted.  No guarding or rebound tenderness.   Rectal: Digital rectal exam revealed tenderness in the posterior wall of the anal canal consistent with anal  fissure Msk:  Symmetrical without gross deformities. Good, equal movement & strength bilaterally. Pulses:  Normal pulses noted. Extremities:  No clubbing or edema.  No cyanosis. Neurologic:  Alert and oriented x3;  grossly normal neurologically. Skin:  Intact without significant lesions or rashes. No jaundice. Psych:  Alert and cooperative. Normal mood and affect.  Imaging Studies: reviewed  Assessment and Plan:   Sarah Moran is a 38 y.o. Caucasian female with chronic constipation,six-month history of dyspepsia  Chronic constipation: Which led to rectocele and cystocele, status post repair and bladder sling Linaclotide did not help Continue high-fiber diet with adequate fluid intake She is taking Fiberchoice and fiber bars 2-3 times daily.  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 pylori infection based on gastric biopsies Finished triple therapy, H. pylori breath test posttreatment return positive, status post treatment with bismuth quadruple therapy.  Will repeat H. pylori breath test   Follow up as needed  Sarah Darby, MD

## 2018-10-22 ENCOUNTER — Encounter: Payer: Self-pay | Admitting: Gastroenterology

## 2018-10-24 ENCOUNTER — Other Ambulatory Visit: Payer: Self-pay | Admitting: Gastroenterology

## 2018-10-24 ENCOUNTER — Other Ambulatory Visit: Payer: Self-pay

## 2018-10-24 DIAGNOSIS — Z8619 Personal history of other infectious and parasitic diseases: Secondary | ICD-10-CM | POA: Diagnosis not present

## 2018-10-26 LAB — H. PYLORI BREATH TEST: H pylori Breath Test: NEGATIVE

## 2018-11-02 ENCOUNTER — Other Ambulatory Visit: Payer: Self-pay | Admitting: Family Medicine

## 2018-11-07 ENCOUNTER — Ambulatory Visit: Payer: BLUE CROSS/BLUE SHIELD | Admitting: Gastroenterology

## 2018-11-07 ENCOUNTER — Encounter: Payer: Self-pay | Admitting: Gastroenterology

## 2018-11-07 VITALS — BP 90/60 | HR 84 | Ht 60.63 in | Wt 137.1 lb

## 2018-11-07 DIAGNOSIS — K5904 Chronic idiopathic constipation: Secondary | ICD-10-CM

## 2018-11-07 MED ORDER — LINACLOTIDE 290 MCG PO CAPS: ORAL_CAPSULE | ORAL | 3 refills | Status: DC

## 2018-11-07 NOTE — Patient Instructions (Signed)
We have sent refills of Linzess to your pharmacy  Continue Fiber  Continue pelvic exercises  Continue Linzess  Follow up in 1 year  If you are age 40 or older, your body mass index should be between 23-30. Your Body mass index is 26.23 kg/m. If this is out of the aforementioned range listed, please consider follow up with your Primary Care Provider.  If you are age 40 or younger, your body mass index should be between 19-25. Your Body mass index is 26.23 kg/m. If this is out of the aformentioned range listed, please consider follow up with your Primary Care Provider.    I appreciate the  opportunity to care for you  Thank You   Harl Bowie , MD

## 2018-11-07 NOTE — Progress Notes (Signed)
Sarah Moran    [id number]    [birth year]  Primary Care Physician:Sonnenberg, Angela Adam, MD  Referring Physician: Leone Haven, MD [address] [place], [place] [address]  Chief complaint: Constipation  HPI: 38 year old female here for management of chronic constipation and pelvic floor dysfunction. She has had chronic constipation since her pregnancy and childbirth.  Worse after pelvic floor surgery for cervical prolapse, rectocele and cystocele.  She had to digitally disimpact and manipulate to have a bowel movement but since she started doing pelvic floor exercises and taking Linzess 290 mcg daily she is having improvement of bowel habits. Anorectal manometry showed normal anal sphincter relaxation during attempted defecation and no evidence of dyssynergy defecation She is no longer using Fleet enemas.  She is having almost daily bowel movement.  Overall doing better. Denies any nausea, vomiting, abdominal pain, melena or bright red blood per rectum   EGD September 2019+ for H. pylori status post triple therapy.  H pylori breath test February 2020 confirmed eradication Colonoscopy 10 years ago normal per patient, report not available  Outpatient Encounter Medications as of 11/07/2018  Medication Sig  . Artificial Tear Solution (SOOTHE XP) SOLN Place 1 drop into both eyes daily as needed (dry eyes).  . Biotin 2500 MCG CAPS Take 1 capsule by mouth daily.  . cetirizine (ZYRTEC) 10 MG tablet Take 10 mg by mouth at bedtime.   . Cholecalciferol (VITAMIN D3) 50 MCG (2000 UT) capsule Take 2,000 Units by mouth daily.  Marland Kitchen Cod Liver Oil CAPS Take 1 capsule by mouth at bedtime.   . fluticasone (FLONASE) 50 MCG/ACT nasal spray Place 2 sprays into both nostrils daily as needed.   Marland Kitchen ibuprofen (ADVIL,MOTRIN) 800 MG tablet Take 800 mg by mouth every 8 (eight) hours as needed (pain).  Marland Kitchen LINZESS 290 MCG CAPS capsule TAKE 1 CAPSULE (290 MCG TOTAL) BY MOUTH 2 (TWO) TIMES  DAILY BEFORE A MEAL  . Omega-3 Fatty Acids (FISH OIL) 1200 MG CAPS Take 1 capsule by mouth daily.   . SUMAtriptan (IMITREX) 50 MG tablet TAKE ONE TABLET BY MOUTH AT ONSET OF MIGRAINE - MAY REPEAT DOSE IN 2 HOURS IF HEADACHE PERSISTS OR RECURS (Patient taking differently: Take 50 mg by mouth every 2 (two) hours as needed for migraine. )  . SYNTHROID 75 MCG tablet TAKE 1 TABLET (75 MCG TOTAL) BY MOUTH DAILY BEFORE BREAKFAST.  Marland Kitchen vitamin C (ASCORBIC ACID) 500 MG tablet Take 500 mg by mouth daily.  . [DISCONTINUED] EQ FIBER SUPPLEMENT PO Take 2 tablets by mouth 3 (three) times daily.   No facility-administered encounter medications on file as of 11/07/2018.     Allergies as of 11/07/2018 - Review Complete 11/07/2018  Allergen Reaction Noted  . Darvon [propoxyphene] Other (See Comments) 09/23/2013  . Onion Other (See Comments) 07/27/2018  . Percocet [oxycodone-acetaminophen] Hives 09/23/2013  . Vicodin [hydrocodone-acetaminophen] Hives 09/23/2013    Past Medical History:  Diagnosis Date  . Allergy   . Anal fissure   . Anxiety    no meds  . Chronic pelvic pain in female   . Family history of breast cancer    My Risk neg 5/18  . Frequent headaches   . Gallstone   . Gallstones   . Genetic testing of female 01/2017   My Risk/BRCA neg  . GERD (gastroesophageal reflux disease)    well controlled. Takes gingerroot when symptoms  . Hypothyroidism    s/p  thyroidectomy - levothyroxine 75 mcg daily  . IBS (irritable bowel syndrome)    constipation - takes miralax daily  . Increased risk of breast cancer 01/2017   IBIS=18.8%/riskscore=21.9%  . Liver cyst 04/2018  . Migraine    takes excedrin. She has tried imitrex in the past   . Personal history of radiation therapy   . Seizure ([place]) 1997    x 1-age 4-after surgery for scoliosis  . Thyroid cancer ([place]) 2007   s/p thyroidectomy    Past Surgical History:  Procedure Laterality Date  . ANAL RECTAL MANOMETRY N/A 10/12/2018   Procedure:  ANO RECTAL MANOMETRY;  Surgeon: Mauri Pole, MD;  [facility]: [facility];  Service: Endoscopy;  Laterality: N/A;  . ANTERIOR AND POSTERIOR REPAIR WITH SACROSPINOUS FIXATION N/A 08/02/2018   Procedure: ANTERIOR AND POSTERIOR REPAIR;  Surgeon: Homero Fellers, MD;  Location: [facility];  Service: Gynecology;  Laterality: N/A;  . BACK SURGERY  1997   scoliosis  . CHOLECYSTECTOMY N/A 09/21/2018   Procedure: LAPAROSCOPIC CHOLECYSTECTOMY;  Surgeon: Fredirick Maudlin, MD;  Location: [facility];  Service: General;  Laterality: N/A;  . COLONOSCOPY    . ESOPHAGOGASTRODUODENOSCOPY (EGD) WITH PROPOFOL N/A 06/12/2018   Procedure: ESOPHAGOGASTRODUODENOSCOPY (EGD) WITH PROPOFOL;  Surgeon: Lin Landsman, MD;  Location: [facility];  Service: Gastroenterology;  Laterality: N/A;  . HERNIA REPAIR  1990   bilateral inguinal  . PUBOVAGINAL SLING N/A 08/02/2018   Procedure: PUBO-VAGINAL SLING- TVT;  Surgeon: Homero Fellers, MD;  Location: [facility];  Service: Gynecology;  Laterality: N/A;  . THYROIDECTOMY  2007   thyroid cancer  . TOTAL ABDOMINAL HYSTERECTOMY  2008   dymenorrhea    Family History  Problem Relation Age of Onset  . Hyperlipidemia Father   . Hypertension Father   . Clotting disorder Sister   . Hepatitis B Brother   . Cancer Paternal Grandmother 63       breast cancer? with mets  . Breast cancer Paternal Grandmother 22  . Cancer Mother        Cervical Cancer  . Cancer Maternal Aunt        throat cancer  . Breast cancer Paternal Aunt        9  . Breast cancer Paternal Aunt        66  . Breast cancer Paternal Aunt        47    Social History   Socioeconomic History  . Marital status: Married    Spouse name: Audelia Acton  . Number of children: 2  . Years of education: 17  . Highest education level: Not on file  Occupational History  . Occupation: Stay at [employer]  . Financial resource strain: Not on file  . Food insecurity:    Worry: Not on file      Inability: Not on file  . Transportation needs:    Medical: Not on file    Non-medical: Not on file  Tobacco Use  . Smoking status: Never Smoker  . Smokeless tobacco: Never Used  Substance and Sexual Activity  . Alcohol use: Yes    Comment: Occasionally - very rarely  . Drug use: No  . Sexual activity: Not Currently    Birth control/protection: Surgical    Comment: Hysterectomy  Lifestyle  . Physical activity:    Days per week: Not on file    Minutes per session: Not on file  . Stress: Not on file  Relationships  . Social connections:  Talks on phone: Not on file    Gets together: Not on file    Attends religious service: Not on file    Active member of club or organization: Not on file    Attends meetings of clubs or organizations: Not on file    Relationship status: Not on file  . Intimate partner violence:    Fear of current or ex partner: Not on file    Emotionally abused: Not on file    Physically abused: Not on file    Forced sexual activity: Not on file  Other Topics Concern  . Not on file  Social History Narrative   Delainie grew up in [place]. She currently lives in [place] with her husband, Audelia Acton, and their 2 sons ([place]  and [place] ). 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 Sherena belongs to the [employer]. She enjoys outdoor activities on her spare time.      Review of systems: Review of Systems  Constitutional: Negative for fever and chills.  HENT: Positive for sinus problem Eyes: Negative for blurred vision.  Respiratory: Negative for cough, shortness of breath and wheezing.   Cardiovascular: Negative for chest pain and palpitations.  Gastrointestinal: as per HPI Genitourinary: Negative for dysuria, urgency, frequency and hematuria.  Musculoskeletal: Negative for myalgias, back pain and joint pain.  Skin: Negative for itching and rash.  Neurological: Negative for dizziness,  tremors, focal weakness, seizures and loss of consciousness.  Endo/Heme/Allergies: Positive for seasonal allergies.  Psychiatric/Behavioral: Negative for depression, suicidal ideas and hallucinations.  All other systems reviewed and are negative.   Physical Exam: Vitals:   11/07/18 0912  BP: 90/60  Pulse: 84   Body mass index is 26.23 kg/m. Gen:      No acute distress HEENT:  EOMI, sclera anicteric Neck:     No masses; no thyromegaly Lungs:    Clear to auscultation bilaterally; normal respiratory effort CV:         Regular rate and rhythm; no murmurs Abd:      + bowel sounds; soft, non-tender; no palpable masses, no distension Ext:    No edema; adequate peripheral perfusion Skin:      Warm and dry; no rash Neuro: alert and oriented x 3 Psych: normal mood and affect  Data Reviewed:  Reviewed labs, radiology imaging, old records and pertinent past GI work up   Assessment and Plan/Recommendations:  38 year old female status post pelvic floor surgery for rectocele and cystocele with chronic constipation No evidence of dyssynergy defecation on anorectal manometry Continue Linzess 290 mcg daily Continue high-fiber diet Continue pelvic floor exercises to prevent recurrent pelvic floor dysfunction H. pylori gastritis status post eradication Return in 1 year or sooner if needed  25 minutes was spent face-to-face with the patient. Greater than 50% of the time used for counseling as well as treatment plan and follow-up. She had multiple questions which were answered to her satisfaction  K. Denzil Magnuson , MD [phone]    CC: Leone Haven, MD

## 2018-11-09 ENCOUNTER — Ambulatory Visit: Payer: BLUE CROSS/BLUE SHIELD | Admitting: Obstetrics and Gynecology

## 2018-11-13 ENCOUNTER — Ambulatory Visit (INDEPENDENT_AMBULATORY_CARE_PROVIDER_SITE_OTHER): Payer: BLUE CROSS/BLUE SHIELD | Admitting: Obstetrics and Gynecology

## 2018-11-13 ENCOUNTER — Encounter: Payer: Self-pay | Admitting: Obstetrics and Gynecology

## 2018-11-13 VITALS — BP 110/60 | HR 78 | Ht 60.0 in | Wt 138.0 lb

## 2018-11-13 DIAGNOSIS — Z4889 Encounter for other specified surgical aftercare: Secondary | ICD-10-CM

## 2018-11-13 NOTE — Progress Notes (Signed)
  Postoperative Follow-up Patient presents post op from  [place]  for  prolapse and stress incontinence , 3 months ago.  Subjective: Patient reports some improvement in her preop symptoms. Eating a regular diet without difficulty. Pain is controlled without any medications.  Activity: normal activities of daily living. Patient reports additional symptom's since surgery of None.  Objective: BP 110/60   Pulse 78   Ht 5' (1.524 m)   Wt 138 lb (62.6 kg)   BMI 26.95 kg/m  OBGyn Exam  Assessment: s/p :  ANTERIOR AND POSTERIOR REPAIR RETROPUBIC PUBO-VAGINAL SLING- TVT PERINEORRHAPHY  stable  Plan: Patient has done well after surgery with no apparent complications.  I have discussed the post-operative course to date, and the expected progress moving forward.  The patient understands what complications to be concerned about.  I will see the patient in routine follow up, or sooner if needed.    Was able to have intercourse without major issue. Some discomfort with certain positions, but very minor. Is happy.  Has been doing well having regular bowel movements.   Adrian Prows MD [facility], [facility] 11/13/2018 5:23 PM

## 2018-12-11 ENCOUNTER — Other Ambulatory Visit: Payer: Self-pay

## 2018-12-12 ENCOUNTER — Encounter: Payer: Self-pay | Admitting: Endocrinology

## 2018-12-12 ENCOUNTER — Ambulatory Visit: Payer: BLUE CROSS/BLUE SHIELD | Admitting: Endocrinology

## 2018-12-12 VITALS — BP 96/64 | HR 67 | Temp 98.0°F | Ht 60.0 in | Wt 138.0 lb

## 2018-12-12 DIAGNOSIS — Z85850 Personal history of malignant neoplasm of thyroid: Secondary | ICD-10-CM | POA: Diagnosis not present

## 2018-12-12 DIAGNOSIS — E039 Hypothyroidism, unspecified: Secondary | ICD-10-CM

## 2018-12-12 LAB — VITAMIN D 25 HYDROXY (VIT D DEFICIENCY, FRACTURES): VITD: 54.8 ng/mL (ref 30.00–100.00)

## 2018-12-12 LAB — TSH: TSH: 3.07 u[IU]/mL (ref 0.35–4.50)

## 2018-12-12 NOTE — Patient Instructions (Signed)
Blood tests are requested for you today.  We'll let you know about the results.  Please come back for a follow-up appointment in 1 year.    

## 2018-12-12 NOTE — Progress Notes (Signed)
Subjective:    Patient ID: Sarah Moran, female    DOB: 1981/06/24, 38 y.o.   MRN: [id number]  HPI The state of at least three ongoing medical problems is addressed today, with interval history of each noted here:  Stage 1 PTC: 2006: CT: right thyroid mass 2006: right lobect, path unknown 1/07: completion left thyroidect.  Path showed 2 foci PTC, with max diameter 2 mm 2/07: RAI rx, 100 mCi 3/19: TG undetectable (Ab neg) Denies neck swelling.  This is a stable problem. 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  She takes non-rx vit-D, 2000 units/day.  This is a stable problem. 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  This is a stable problem.  Past Medical History:  Diagnosis Date  . Allergy   . Anal fissure   . Anxiety    no meds  . Chronic pelvic pain in female   . Family history of breast cancer    My Risk neg 5/18  . Frequent headaches   . Gallstone   . Gallstones   . Genetic testing of female 01/2017   My Risk/BRCA neg  . GERD (gastroesophageal reflux disease)    well controlled. Takes gingerroot when symptoms  . Hypothyroidism    s/p thyroidectomy - levothyroxine 75 mcg daily  . IBS (irritable bowel syndrome)    constipation - takes miralax daily  . Increased risk of breast cancer 01/2017   IBIS=18.8%/riskscore=21.9%  . Liver cyst 04/2018  . Migraine    takes excedrin. She has tried imitrex in the past   . Personal history of radiation therapy   . Seizure ([place]) 1997    x 1-age 73-after surgery for scoliosis  . Thyroid cancer ([place]) 2007   s/p thyroidectomy    Past Surgical History:  Procedure Laterality Date  . ANAL RECTAL MANOMETRY N/A 10/12/2018   Procedure: ANO RECTAL MANOMETRY;  Surgeon: Mauri Pole, MD;  [facility];  Service: Endoscopy;  Laterality: N/A;  . ANTERIOR AND POSTERIOR REPAIR WITH SACROSPINOUS FIXATION N/A 08/02/2018   Procedure: ANTERIOR AND POSTERIOR REPAIR;  Surgeon: Homero Fellers, MD;  Location: [facility];   Service: Gynecology;  Laterality: N/A;  . BACK SURGERY  1997   scoliosis  . CHOLECYSTECTOMY N/A 09/21/2018   Procedure: LAPAROSCOPIC CHOLECYSTECTOMY;  Surgeon: Fredirick Maudlin, MD;  Location: [facility];  Service: General;  Laterality: N/A;  . COLONOSCOPY    . ESOPHAGOGASTRODUODENOSCOPY (EGD) WITH PROPOFOL N/A 06/12/2018   Procedure: ESOPHAGOGASTRODUODENOSCOPY (EGD) WITH PROPOFOL;  Surgeon: Lin Landsman, MD;  Location: [facility];  Service: Gastroenterology;  Laterality: N/A;  . HERNIA REPAIR  1990   bilateral inguinal  . PUBOVAGINAL SLING N/A 08/02/2018   Procedure: PUBO-VAGINAL SLING- TVT;  Surgeon: Homero Fellers, MD;  Location: [facility];  Service: Gynecology;  Laterality: N/A;  . THYROIDECTOMY  2007   thyroid cancer  . TOTAL ABDOMINAL HYSTERECTOMY  2008   dymenorrhea    Social History   Socioeconomic History  . Marital status: Married    Spouse name: Audelia Acton  . Number of children: 2  . Years of education: 41  . Highest education level: Not on file  Occupational History  . Occupation: Stay at [employer]  . Financial resource strain: Not on file  . Food insecurity:    Worry: Not on file    Inability: Not on file  . Transportation needs:    Medical: Not on file    Non-medical: Not on file  Tobacco Use  .  Smoking status: Never Smoker  . Smokeless tobacco: Never Used  Substance and Sexual Activity  . Alcohol use: Yes    Comment: Occasionally - very rarely  . Drug use: No  . Sexual activity: Yes    Birth control/protection: Surgical    Comment: Hysterectomy  Lifestyle  . Physical activity:    Days per week: Not on file    Minutes per session: Not on file  . Stress: Not on file  Relationships  . Social connections:    Talks on phone: Not on file    Gets together: Not on file    Attends religious service: Not on file    Active member of club or organization: Not on file    Attends meetings of clubs or organizations: Not on file     Relationship status: Not on file  . Intimate partner violence:    Fear of current or ex partner: Not on file    Emotionally abused: Not on file    Physically abused: Not on file    Forced sexual activity: Not on file  Other Topics Concern  . Not on file  Social History Narrative   Sharita grew up in [place]. She currently lives in [place] with her husband, Audelia Acton, and their 2 sons ([place]  and [place] ). 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 Fayelynn belongs to the [employer]. She enjoys outdoor activities on her spare time.    Current Outpatient Medications on File Prior to Visit  Medication Sig Dispense Refill  . Artificial Tear Solution (SOOTHE XP) SOLN Place 1 drop into both eyes daily as needed (dry eyes).    . Biotin 2500 MCG CAPS Take 1 capsule by mouth daily.    . cetirizine (ZYRTEC) 10 MG tablet Take 10 mg by mouth at bedtime.     . Cholecalciferol (VITAMIN D3) 50 MCG (2000 UT) capsule Take 2,000 Units by mouth daily.    Marland Kitchen Cod Liver Oil CAPS Take 1 capsule by mouth at bedtime.     . fluticasone (FLONASE) 50 MCG/ACT nasal spray Place 2 sprays into both nostrils daily as needed.     Marland Kitchen ibuprofen (ADVIL,MOTRIN) 800 MG tablet Take 800 mg by mouth every 8 (eight) hours as needed (pain).    Marland Kitchen linaclotide (LINZESS) 290 MCG CAPS capsule TAKE 1 CAPSULE (290 MCG TOTAL) BY MOUTH 2 (TWO) TIMES DAILY BEFORE A MEAL 180 capsule 3  . Omega-3 Fatty Acids (FISH OIL) 1200 MG CAPS Take 1 capsule by mouth daily.     . SUMAtriptan (IMITREX) 50 MG tablet TAKE ONE TABLET BY MOUTH AT ONSET OF MIGRAINE - MAY REPEAT DOSE IN 2 HOURS IF HEADACHE PERSISTS OR RECURS (Patient taking differently: Take 50 mg by mouth every 2 (two) hours as needed for migraine. ) 10 tablet 11  . SYNTHROID 75 MCG tablet TAKE 1 TABLET (75 MCG TOTAL) BY MOUTH DAILY BEFORE BREAKFAST. 90 tablet 1  . vitamin C (ASCORBIC ACID) 500 MG tablet Take 500 mg by mouth daily.    Marland Kitchen  loratadine (CLARITIN) 10 MG tablet Take 10 mg by mouth daily.     No current facility-administered medications on file prior to visit.     Allergies  Allergen Reactions  . Darvon [Propoxyphene] Other (See Comments)    Hallucinations   . Onion Other (See Comments)    RAW-MIGRAINES  . Percocet [Oxycodone-Acetaminophen] Hives  . Vicodin [Hydrocodone-Acetaminophen] Hives    Family  History  Problem Relation Age of Onset  . Hyperlipidemia Father   . Hypertension Father   . Clotting disorder Sister   . Hepatitis B Brother   . Cancer Paternal Grandmother 40       breast cancer? with mets  . Breast cancer Paternal Grandmother 37  . Cancer Mother        Cervical Cancer  . Cancer Maternal Aunt        throat cancer  . Breast cancer Paternal Aunt        79  . Breast cancer Paternal Aunt        8  . Breast cancer Paternal Aunt        18    BP 96/64 (BP Location: Right Arm, Patient Position: Sitting, Cuff Size: Normal)   Pulse 67   Temp 98 F (36.7 C)   Ht 5' (1.524 m)   Wt 138 lb (62.6 kg)   SpO2 96%   BMI 26.95 kg/m   Review of Systems Denies numbness.  No change in chronic constipation    Objective:   Physical Exam VITAL SIGNS:  See vs page GENERAL: no distress Neck: a healed scar is present.  I do not appreciate a nodule in the thyroid or elsewhere in the neck  Lab Results  Component Value Date   TSH 3.07 12/12/2018   T4TOTAL 12.7 (H) 01/22/2014       Assessment & Plan:  PTC: no clinical evidence of recurrence Vit-D def: recheck today Postsurgical hypothyroidism: well-replaced.  Please continue the same medication.  Please come back for a follow-up appointment in 1 year.

## 2018-12-13 LAB — PTH, INTACT AND CALCIUM
Calcium: 9.3 mg/dL (ref 8.6–10.2)
PTH: 18 pg/mL (ref 14–64)

## 2018-12-13 LAB — THYROGLOBULIN LEVEL: Thyroglobulin: <0.1 ng/mL — ABNORMAL LOW

## 2018-12-13 LAB — THYROGLOBULIN ANTIBODY: Thyroglobulin Ab: <1 IU/mL (ref <=1)

## 2019-02-10 ENCOUNTER — Other Ambulatory Visit: Payer: Self-pay | Admitting: Family Medicine

## 2019-02-13 ENCOUNTER — Telehealth: Payer: Self-pay | Admitting: *Deleted

## 2019-02-13 NOTE — Telephone Encounter (Signed)
Copied from [place] [id number]. Topic: Appointment Scheduling - Scheduling Inquiry for Clinic >> Feb 12, 2019  4:47 PM Vernona Rieger wrote: Reason for CRM: patient would like to her r/s her appt that she has in June. Please advise.

## 2019-03-06 ENCOUNTER — Ambulatory Visit: Payer: BLUE CROSS/BLUE SHIELD | Admitting: Family Medicine

## 2019-05-02 ENCOUNTER — Other Ambulatory Visit: Payer: Self-pay

## 2019-05-06 ENCOUNTER — Other Ambulatory Visit: Payer: Self-pay

## 2019-05-06 ENCOUNTER — Encounter: Payer: Self-pay | Admitting: Family Medicine

## 2019-05-06 ENCOUNTER — Ambulatory Visit (INDEPENDENT_AMBULATORY_CARE_PROVIDER_SITE_OTHER): Payer: BC Managed Care – PPO | Admitting: Family Medicine

## 2019-05-06 DIAGNOSIS — E039 Hypothyroidism, unspecified: Secondary | ICD-10-CM

## 2019-05-06 DIAGNOSIS — J309 Allergic rhinitis, unspecified: Secondary | ICD-10-CM | POA: Diagnosis not present

## 2019-05-06 DIAGNOSIS — G43909 Migraine, unspecified, not intractable, without status migrainosus: Secondary | ICD-10-CM

## 2019-05-06 MED ORDER — SUMATRIPTAN SUCCINATE 50 MG PO TABS: ORAL_TABLET | ORAL | 2 refills | Status: DC

## 2019-05-06 MED ORDER — MONTELUKAST SODIUM 10 MG PO TABS: 10.0000 mg | ORAL_TABLET | Freq: Every day | ORAL | 3 refills | Status: DC

## 2019-05-06 NOTE — Assessment & Plan Note (Signed)
Continue Synthroid °

## 2019-05-06 NOTE — Progress Notes (Signed)
Virtual Visit via telephone Note  This visit type was conducted due to national recommendations for restrictions regarding the COVID-19 pandemic (e.g. social distancing).  This format is felt to be most appropriate for this patient at this time.  All issues noted in this document were discussed and addressed.  No physical exam was performed (except for noted visual exam findings with Video Visits).   I connected with [employer] today at  3:30 PM EDT by telephone and verified that I am speaking with the correct person using two identifiers. Location patient: home Location provider: work  Persons participating in the virtual visit: patient, provider   I discussed the limitations, risks, security and privacy concerns of performing an evaluation and management service by telephone and the availability of in person appointments. I also discussed with the patient that there may be a patient responsible charge related to this service. The patient expressed understanding and agreed to proceed.  Interactive audio and video telecommunications were attempted between this provider and patient, however failed, due to patient having technical difficulties OR patient did not have access to video capability.  We continued and completed visit with audio only.  Reason for visit: follow-up  HPI: HYPOTHYROIDISM Disease Monitoring Weight changes: Relatively stable.  Skin Changes: no Heat/Cold intolerance: some chronic cold intolerance  Medication Monitoring Compliance:  Taking synthroid   Last TSH:   Lab Results  Component Value Date   TSH 3.07 12/12/2018   Allergic rhinitis: Patient continues to have some issues with this.  This has been going on for about 3 months with some postnasal drip and nasal congestion intermittently.  No sneezing.  She has been taking Claritin in the morning and Xyzal at night over the last week or so.  She was using Flonase though she notes sometimes it would help and  sometimes it would.  Typically she will have symptoms after being outside and playing golf.  Migraines: Patient notes over the last several months she has had increase in frequency to several times per week.  She does have aura with this.  No numbness or weakness.  Describes her headaches as in her temples and shoulders and the back of her head.  3 months ago she had one episode of vomiting with this which she has had with her migraines previously.  She will start with ibuprofen or Tylenol though if that is not beneficial she will take Imitrex.  ROS: See pertinent positives and negatives per HPI.  Past Medical History:  Diagnosis Date  . Allergy   . Anal fissure   . Anxiety    no meds  . Chronic pelvic pain in female   . Family history of breast cancer    My Risk neg 5/18  . Frequent headaches   . Gallstone   . Gallstones   . Genetic testing of female 01/2017   My Risk/BRCA neg  . GERD (gastroesophageal reflux disease)    well controlled. Takes gingerroot when symptoms  . Hypothyroidism    s/p thyroidectomy - levothyroxine 75 mcg daily  . IBS (irritable bowel syndrome)    constipation - takes miralax daily  . Increased risk of breast cancer 01/2017   IBIS=18.8%/riskscore=21.9%  . Liver cyst 04/2018  . Migraine    takes excedrin. She has tried imitrex in the past   . Personal history of radiation therapy   . Seizure ([place]) 1997    x 1-age 9-after surgery for scoliosis  . Thyroid cancer ([place]) 2007  s/p thyroidectomy    Past Surgical History:  Procedure Laterality Date  . ANAL RECTAL MANOMETRY N/A 10/12/2018   Procedure: ANO RECTAL MANOMETRY;  Surgeon: Mauri Pole, MD;  Location: [facility];  Service: Endoscopy;  Laterality: N/A;  . ANTERIOR AND POSTERIOR REPAIR WITH SACROSPINOUS FIXATION N/A 08/02/2018   Procedure: ANTERIOR AND POSTERIOR REPAIR;  Surgeon: Homero Fellers, MD;  Location: [facility];  Service: Gynecology;  Laterality: N/A;  . BACK SURGERY  1997    scoliosis  . CHOLECYSTECTOMY N/A 09/21/2018   Procedure: LAPAROSCOPIC CHOLECYSTECTOMY;  Surgeon: Fredirick Maudlin, MD;  Location: [facility];  Service: General;  Laterality: N/A;  . COLONOSCOPY    . ESOPHAGOGASTRODUODENOSCOPY (EGD) WITH PROPOFOL N/A 06/12/2018   Procedure: ESOPHAGOGASTRODUODENOSCOPY (EGD) WITH PROPOFOL;  Surgeon: Lin Landsman, MD;  Location: [place];  Service: Gastroenterology;  Laterality: N/A;  . HERNIA REPAIR  1990   bilateral inguinal  . PUBOVAGINAL SLING N/A 08/02/2018   Procedure: PUBO-VAGINAL SLING- TVT;  Surgeon: Homero Fellers, MD;  Location: [facility];  Service: Gynecology;  Laterality: N/A;  . THYROIDECTOMY  2007   thyroid cancer  . TOTAL ABDOMINAL HYSTERECTOMY  2008   dymenorrhea    Family History  Problem Relation Age of Onset  . Hyperlipidemia Father   . Hypertension Father   . Clotting disorder Sister   . Hepatitis B Brother   . Cancer Paternal Grandmother 80       breast cancer? with mets  . Breast cancer Paternal Grandmother 23  . Cancer Mother        Cervical Cancer  . Cancer Maternal Aunt        throat cancer  . Breast cancer Paternal Aunt        55  . Breast cancer Paternal Aunt        75  . Breast cancer Paternal Aunt        3    SOCIAL HX: Non-smoker.   Current Outpatient Medications:  .  Artificial Tear Solution (SOOTHE XP) SOLN, Place 1 drop into both eyes daily as needed (dry eyes)., Disp: , Rfl:  .  Biotin 2500 MCG CAPS, Take 1 capsule by mouth daily., Disp: , Rfl:  .  Cholecalciferol (VITAMIN D3) 50 MCG (2000 UT) capsule, Take 2,000 Units by mouth daily., Disp: , Rfl:  .  Cod Liver Oil CAPS, Take 1 capsule by mouth at bedtime. , Disp: , Rfl:  .  fluticasone (FLONASE) 50 MCG/ACT nasal spray, Place 2 sprays into both nostrils daily as needed. , Disp: , Rfl:  .  ibuprofen (ADVIL,MOTRIN) 800 MG tablet, Take 800 mg by mouth every 8 (eight) hours as needed (pain)., Disp: , Rfl:  .  levocetirizine (XYZAL) 5 MG  tablet, Take 5 mg by mouth every evening., Disp: , Rfl:  .  linaclotide (LINZESS) 290 MCG CAPS capsule, TAKE 1 CAPSULE (290 MCG TOTAL) BY MOUTH 2 (TWO) TIMES DAILY BEFORE A MEAL, Disp: 180 capsule, Rfl: 3 .  Omega-3 Fatty Acids (FISH OIL) 1200 MG CAPS, Take 1 capsule by mouth daily. , Disp: , Rfl:  .  SUMAtriptan (IMITREX) 50 MG tablet, TAKE ONE TABLET BY MOUTH AT ONSET OF MIGRAINE - MAY REPEAT DOSE IN 2 HOURS IF HEADACHE PERSISTS OR RECURS, Disp: 10 tablet, Rfl: 2 .  SYNTHROID 75 MCG tablet, TAKE 1 TABLET (75 MCG TOTAL) BY MOUTH DAILY BEFORE BREAKFAST., Disp: 90 tablet, Rfl: 1 .  vitamin C (ASCORBIC ACID) 500 MG tablet, Take 500 mg by mouth daily.,  Disp: , Rfl:  .  montelukast (SINGULAIR) 10 MG tablet, Take 1 tablet (10 mg total) by mouth at bedtime., Disp: 30 tablet, Rfl: 3  EXAM: This is a telehealth telephone visit notes no physical exam was completed.  ASSESSMENT AND PLAN:  Discussed the following assessment and plan:  Migraine Increasing frequency.  She does not know what her pulse has been recently.  We will check with our clinical pharmacist regarding possible medication options for this patient.  We will let the patient know once we hear back.  Follow-up in 6 weeks.  Hypothyroidism Continue Synthroid.  Allergic rhinitis Symptoms consistent with allergic rhinitis.  She will start on Flonase daily.  She will continue Xyzal.  Discontinue Claritin.  Singulair sent to pharmacy for her to start on if additional Flonase is not adequate.   I discussed the assessment and treatment plan with the patient. The patient was provided an opportunity to ask questions and all were answered. The patient agreed with the plan and demonstrated an understanding of the instructions.   The patient was advised to call back or seek an in-person evaluation if the symptoms worsen or if the condition fails to improve as anticipated.  I provided 16 minutes of non-face-to-face time during this encounter.    Tommi Rumps, MD

## 2019-05-06 NOTE — Assessment & Plan Note (Addendum)
Increasing frequency.  She does not know what her pulse has been recently.  We will check with our clinical pharmacist regarding possible medication options for this patient.  We will let the patient know once we hear back.  Follow-up in 6 weeks.

## 2019-05-06 NOTE — Assessment & Plan Note (Signed)
Symptoms consistent with allergic rhinitis.  She will start on Flonase daily.  She will continue Xyzal.  Discontinue Claritin.  Singulair sent to pharmacy for her to start on if additional Flonase is not adequate.

## 2019-05-07 ENCOUNTER — Telehealth: Payer: Self-pay | Admitting: Family Medicine

## 2019-05-07 NOTE — Telephone Encounter (Signed)
Please let the patient know that I heard back from the clinical pharmacist regarding the medication for her headaches.  The pharmacist and I both feel that a trial of a medicine called venlafaxine may be the most beneficial and likely the best tolerated medication.  Once you speak with her I can send that to her pharmacy for her to try.  Thanks.

## 2019-05-07 NOTE — Telephone Encounter (Signed)
-----   Message from De Hollingshead, [facility] sent at 05/06/2019  4:46 PM EDT ----- Marykay Lex  Agreed, I'd avoid beta blockers, given historically her BP has been low.   I'm not aware of significant data suggesting one or the other would really be better, given the headache picture. Choice would come down to side effects.   It does sound like her constipation is well controlled recently (per GI note), though wouldn't want amitriptyline to worsen that.   I think either venlafaxine or topiramate are options. I generally feel like venlafaxine is better tolerated, as opposed to the cognition side effects of topiramate.   I notice a hx depression/anxiety - is this well controlled? If not, that would push me towards venlafaxine.  ----- Message ----- From: Leone Haven, MD Sent: 05/06/2019   4:19 PM EDT To: De Hollingshead, [place],   This patient has been having increased frequency of headaches that seem to be a mixture of tension and migraine.  Typically for migraines I would place the patient propranolol as a preventative though I do not know what her heart rate or blood pressure has been recently.  Do you think it would be preferable to start a medicine like amitriptyline or venlafaxine given the mixture of tension headaches and migraines as opposed to Topamax? Thanks for your thoughts.  Randall Hiss

## 2019-05-17 NOTE — Telephone Encounter (Signed)
I called and spoke with the patient and she wants to wait on the medication Venafaxine because she has not had a headache since she did the nasal rinse and she will discuss this with you at her next visit.  Reco Shonk,cma

## 2019-05-18 NOTE — Telephone Encounter (Signed)
Noted  

## 2019-05-28 ENCOUNTER — Other Ambulatory Visit: Payer: Self-pay | Admitting: Obstetrics and Gynecology

## 2019-06-18 ENCOUNTER — Telehealth: Payer: Self-pay

## 2019-06-18 NOTE — Telephone Encounter (Signed)
Copied from [place] [id number]. Topic: General - Other >> Jun 18, 2019  4:13 PM Keene Breath wrote: Reason for CRM: Patient is returning a screening call for her upcoming appt.  Tried the office, but no answer.  Call patient back at [phone]

## 2019-06-18 NOTE — Telephone Encounter (Signed)
Copied from [place] [id number]. Topic: General - Other >> Jun 18, 2019  4:13 PM Keene Breath wrote: Reason for CRM: Patient is returning a screening call for her upcoming appt.  Tried the office, but no answer.  Call patient back at [phone]

## 2019-06-19 ENCOUNTER — Ambulatory Visit: Payer: BC Managed Care – PPO | Admitting: Family Medicine

## 2019-06-19 ENCOUNTER — Other Ambulatory Visit: Payer: Self-pay

## 2019-06-19 ENCOUNTER — Encounter: Payer: Self-pay | Admitting: Family Medicine

## 2019-06-19 VITALS — BP 90/60 | HR 89 | Temp 97.3°F | Ht 60.0 in | Wt 138.4 lb

## 2019-06-19 DIAGNOSIS — E785 Hyperlipidemia, unspecified: Secondary | ICD-10-CM

## 2019-06-19 DIAGNOSIS — Z23 Encounter for immunization: Secondary | ICD-10-CM | POA: Diagnosis not present

## 2019-06-19 DIAGNOSIS — E039 Hypothyroidism, unspecified: Secondary | ICD-10-CM

## 2019-06-19 DIAGNOSIS — Z13220 Encounter for screening for lipoid disorders: Secondary | ICD-10-CM | POA: Diagnosis not present

## 2019-06-19 DIAGNOSIS — R202 Paresthesia of skin: Secondary | ICD-10-CM

## 2019-06-19 DIAGNOSIS — J309 Allergic rhinitis, unspecified: Secondary | ICD-10-CM

## 2019-06-19 DIAGNOSIS — G43909 Migraine, unspecified, not intractable, without status migrainosus: Secondary | ICD-10-CM

## 2019-06-19 DIAGNOSIS — T148XXA Other injury of unspecified body region, initial encounter: Secondary | ICD-10-CM | POA: Diagnosis not present

## 2019-06-19 NOTE — Assessment & Plan Note (Signed)
Much improved.  Continue current regimen.

## 2019-06-19 NOTE — Assessment & Plan Note (Signed)
Check TSH 

## 2019-06-19 NOTE — Assessment & Plan Note (Addendum)
Benign exam.  She has been evaluated by neurology.  I discussed that the next step would be a nerve conduction study.  If she would like to do that in the future we can have her see neurology again.  Check A1c to evaluate for underlying cause.

## 2019-06-19 NOTE — Progress Notes (Signed)
Tommi Rumps, MD Phone: [phone]  Sarah Moran is a 38 y.o. female who presents today for follow-up.  Paresthesias: Patient notes bilateral leg and arm paresthesias with tingling in both locations.  This typically only occurs when she is laying back or laying down.  She had evaluation with neurology and neurologically was intact.  They discussed nerve conduction study though she deferred this.  She wonders if it is a circulation issue.  Migraines: She notes she is doing quite a bit better after starting on Singulair and Flonase.  She notes only having one migraine in the last 6 weeks.  She notes no significant allergic rhinitis symptoms as well.  No sneezing or congestion.  Postnasal drip is improved.  Hypothyroidism: Continues on Synthroid.  She feels cold at night.  Most recent TSH was normal.  She reports feeling like the left distal posterior hamstring is a little more more she than the right one.  No palpable lesions.  Bruising: Patient does report some bruising on her legs at times.  No bruising elsewhere.  Social History   Tobacco Use  Smoking Status Never Smoker  Smokeless Tobacco Never Used     ROS see history of present illness  Objective  Physical Exam Vitals:   06/19/19 1447  BP: 90/60  Pulse: 89  Temp: (!) 97.3 F (36.3 C)  SpO2: 99%    BP Readings from Last 3 Encounters:  06/19/19 90/60  12/12/18 96/64  11/13/18 110/60   Wt Readings from Last 3 Encounters:  06/19/19 138 lb 6.4 oz (62.8 kg)  05/06/19 137 lb (62.1 kg)  12/12/18 138 lb (62.6 kg)    Physical Exam Constitutional:      General: She is not in acute distress.    Appearance: She is not diaphoretic.  Cardiovascular:     Rate and Rhythm: Normal rate and regular rhythm.     Heart sounds: Normal heart sounds.  Pulmonary:     Effort: Pulmonary effort is normal.     Breath sounds: Normal breath sounds.  Musculoskeletal:     Comments: Left distal hamstring area with mild fatty  tissue palpated overlying the hamstring that is slightly increased compared to the right hamstring similar area, no tenderness, no palpable masses  Skin:    General: Skin is warm and dry.  Neurological:     Mental Status: She is alert.     Comments: CN 3-12 intact, 5/5 strength in bilateral biceps, triceps, grip, quads, hamstrings, plantar and dorsiflexion, sensation to light touch intact in bilateral UE and LE, normal gait      Assessment/Plan: Please see individual problem list.  Migraine Much improved.  I suspect allergies were playing a role in migraine flares.  She will continue to monitor.  Allergic rhinitis Much improved.  Continue current regimen.  Hypothyroidism Check TSH.  Paresthesia Benign exam.  She has been evaluated by neurology.  I discussed that the next step would be a nerve conduction study.  If she would like to do that in the future we can have her see neurology again.  Check A1c to evaluate for underlying cause.  Hyperlipidemia Check lipid panel for previously elevated LDL.  Mild fatty prominence in the area of the distal left hamstring.  No palpable abnormalities.  I suspect this is fatty tissue.  She will monitor.   Orders Placed This Encounter  Procedures  . Flu Vaccine QUAD 36+ mos IM  . TSH  . Comp Met (CMET)  . CBC  . Lipid  panel  . HgB A1c    No orders of the defined types were placed in this encounter.    Tommi Rumps, MD [place]

## 2019-06-19 NOTE — Assessment & Plan Note (Addendum)
Check lipid panel for previously elevated LDL.

## 2019-06-19 NOTE — Patient Instructions (Signed)
Nice to see you. We will get lab work today and contact you with results. It may be worthwhile following up with neurology to see if they want to do a nerve conduction study though otherwise please continue to monitor your symptoms and if they worsen please let us know.

## 2019-06-19 NOTE — Assessment & Plan Note (Signed)
Much improved.  I suspect allergies were playing a role in migraine flares.  She will continue to monitor.

## 2019-06-20 LAB — COMPREHENSIVE METABOLIC PANEL
ALT: 9 U/L (ref 0–35)
AST: 14 U/L (ref 0–37)
Albumin: 4.2 g/dL (ref 3.5–5.2)
Alkaline Phosphatase: 66 U/L (ref 39–117)
BUN: 6 mg/dL (ref 6–23)
CO2: 27 mEq/L (ref 19–32)
Calcium: 9.3 mg/dL (ref 8.4–10.5)
Chloride: 103 mEq/L (ref 96–112)
Creatinine, Ser: 0.62 mg/dL (ref 0.40–1.20)
GFR: 106.51 mL/min (ref >60.00)
Glucose, Bld: 72 mg/dL (ref 70–99)
Potassium: 3.6 mEq/L (ref 3.5–5.1)
Sodium: 140 mEq/L (ref 135–145)
Total Bilirubin: 0.5 mg/dL (ref 0.2–1.2)
Total Protein: 6.6 g/dL (ref 6.0–8.3)

## 2019-06-20 LAB — CBC
HCT: 36.7 % (ref 36.0–46.0)
Hemoglobin: 12.4 g/dL (ref 12.0–15.0)
MCHC: 33.9 g/dL (ref 30.0–36.0)
MCV: 89.3 fl (ref 78.0–100.0)
Platelets: 312 10*3/uL (ref 150.0–400.0)
RBC: 4.1 Mil/uL (ref 3.87–5.11)
RDW: 12.7 % (ref 11.5–15.5)
WBC: 8.8 10*3/uL (ref 4.0–10.5)

## 2019-06-20 LAB — HEMOGLOBIN A1C: Hgb A1c MFr Bld: 5.7 % (ref 4.6–6.5)

## 2019-06-20 LAB — LIPID PANEL
Cholesterol: 184 mg/dL (ref 0–200)
HDL: 34.6 mg/dL — ABNORMAL LOW (ref >39.00)
LDL Cholesterol: 124 mg/dL — ABNORMAL HIGH (ref 0–99)
NonHDL: 149.24
Total CHOL/HDL Ratio: 5
Triglycerides: 128 mg/dL (ref 0.0–149.0)
VLDL: 25.6 mg/dL (ref 0.0–40.0)

## 2019-06-20 LAB — TSH: TSH: 1.88 u[IU]/mL (ref 0.35–4.50)

## 2019-07-09 DIAGNOSIS — Z20828 Contact with and (suspected) exposure to other viral communicable diseases: Secondary | ICD-10-CM | POA: Diagnosis not present

## 2019-07-09 DIAGNOSIS — U071 COVID-19: Secondary | ICD-10-CM | POA: Diagnosis not present

## 2019-07-12 ENCOUNTER — Other Ambulatory Visit: Payer: Self-pay | Admitting: Family Medicine

## 2019-07-12 DIAGNOSIS — Z1231 Encounter for screening mammogram for malignant neoplasm of breast: Secondary | ICD-10-CM

## 2019-07-19 ENCOUNTER — Other Ambulatory Visit: Payer: Self-pay | Admitting: Family Medicine

## 2019-08-01 ENCOUNTER — Other Ambulatory Visit: Payer: Self-pay | Admitting: Family Medicine

## 2019-08-01 ENCOUNTER — Other Ambulatory Visit: Payer: Self-pay

## 2019-08-31 ENCOUNTER — Other Ambulatory Visit: Payer: Self-pay | Admitting: Family Medicine

## 2019-09-07 ENCOUNTER — Other Ambulatory Visit: Payer: Self-pay | Admitting: Family Medicine

## 2019-09-16 ENCOUNTER — Encounter: Payer: Self-pay | Admitting: Family Medicine

## 2019-09-23 ENCOUNTER — Other Ambulatory Visit: Payer: Self-pay | Admitting: Family Medicine

## 2019-09-30 ENCOUNTER — Ambulatory Visit (INDEPENDENT_AMBULATORY_CARE_PROVIDER_SITE_OTHER): Payer: BC Managed Care – PPO | Admitting: Family Medicine

## 2019-09-30 ENCOUNTER — Encounter: Payer: Self-pay | Admitting: Family Medicine

## 2019-09-30 ENCOUNTER — Other Ambulatory Visit: Payer: Self-pay

## 2019-09-30 VITALS — Ht 61.0 in | Wt 134.0 lb

## 2019-09-30 DIAGNOSIS — M2550 Pain in unspecified joint: Secondary | ICD-10-CM

## 2019-09-30 NOTE — Assessment & Plan Note (Signed)
Chronic intermittent issue.  Discussed this could represent osteoarthritis versus some other type of arthritis.  She does report tension knots which could be an indicator of a myofascial issue.  We will check lab work to evaluate for underlying causes of arthritis.  Consider physical therapy if lab work is unremarkable.

## 2019-09-30 NOTE — Progress Notes (Signed)
Virtual Visit via video Note  This visit type was conducted due to national recommendations for restrictions regarding the COVID-19 pandemic (e.g. social distancing).  This format is felt to be most appropriate for this patient at this time.  All issues noted in this document were discussed and addressed.  No physical exam was performed (except for noted visual exam findings with Video Visits).   I connected with Sarah Moran today at 11:30 AM EST by a video enabled telemedicine application and verified that I am speaking with the correct person using two identifiers. Location patient: home Location provider: work Persons participating in the virtual visit: patient, provider  I discussed the limitations, risks, security and privacy concerns of performing an evaluation and management service by telephone and the availability of in person appointments. I also discussed with the patient that there may be a patient responsible charge related to this service. The patient expressed understanding and agreed to proceed.  Reason for visit: same day visit  HPI: Arthralgias: Patient notes scattered joint pains.  She has discomfort in her neck with tension knots in the back of her neck and shoulders.  She has also had some lower back discomfort and hip discomfort.  Her left elbow has been bothering her as well.  No specific injuries to these areas.  She does have a history of scoliosis surgery when she was younger.  She notes her ribs were sore as well though that has improved with ibuprofen and heating pad.  She is doing stretches at home.  She has chronic intermittent tingling in her hands and feet which has been evaluated by neurology.  She notes no numbness or weakness.  No incontinence.  She notes these achy issues have been going on for quite some time.   ROS: See pertinent positives and negatives per HPI.  Past Medical History:  Diagnosis Date  . Allergy   . Anal fissure   . Anxiety    no  meds  . Chronic pelvic pain in female   . Family history of breast cancer    My Risk neg 5/18  . Frequent headaches   . Gallstone   . Gallstones   . Genetic testing of female 01/2017   My Risk/BRCA neg  . GERD (gastroesophageal reflux disease)    well controlled. Takes gingerroot when symptoms  . Hypothyroidism    s/p thyroidectomy - levothyroxine 75 mcg daily  . IBS (irritable bowel syndrome)    constipation - takes miralax daily  . Increased risk of breast cancer 01/2017   IBIS=18.8%/riskscore=21.9%  . Liver cyst 04/2018  . Migraine    takes excedrin. She has tried imitrex in the past   . Personal history of radiation therapy   . Seizure ([place]) 1997    x 1-age 40-after surgery for scoliosis  . Thyroid cancer ([place]) 2007   s/p thyroidectomy    Past Surgical History:  Procedure Laterality Date  . ANAL RECTAL MANOMETRY N/A 10/12/2018   Procedure: ANO RECTAL MANOMETRY;  Surgeon: Mauri Pole, MD;  [facility];  Service: Endoscopy;  Laterality: N/A;  . ANTERIOR AND POSTERIOR REPAIR WITH SACROSPINOUS FIXATION N/A 08/02/2018   Procedure: ANTERIOR AND POSTERIOR REPAIR;  Surgeon: Homero Fellers, MD;  Location: [facility];  Service: Gynecology;  Laterality: N/A;  . BACK SURGERY  1997   scoliosis  . CHOLECYSTECTOMY N/A 09/21/2018   Procedure: LAPAROSCOPIC CHOLECYSTECTOMY;  Surgeon: Fredirick Maudlin, MD;  Location: [facility];  Service: General;  Laterality: N/A;  .  COLONOSCOPY    . ESOPHAGOGASTRODUODENOSCOPY (EGD) WITH PROPOFOL N/A 06/12/2018   Procedure: ESOPHAGOGASTRODUODENOSCOPY (EGD) WITH PROPOFOL;  Surgeon: Lin Landsman, MD;  Location: [place];  Service: Gastroenterology;  Laterality: N/A;  . HERNIA REPAIR  1990   bilateral inguinal  . PUBOVAGINAL SLING N/A 08/02/2018   Procedure: PUBO-VAGINAL SLING- TVT;  Surgeon: Homero Fellers, MD;  Location: [facility];  Service: Gynecology;  Laterality: N/A;  . THYROIDECTOMY  2007   thyroid cancer  .  TOTAL ABDOMINAL HYSTERECTOMY  2008   dymenorrhea    Family History  Problem Relation Age of Onset  . Hyperlipidemia Father   . Hypertension Father   . Clotting disorder Sister   . Hepatitis B Brother   . Cancer Paternal Grandmother 48       breast cancer? with mets  . Breast cancer Paternal Grandmother 47  . Cancer Mother        Cervical Cancer  . Cancer Maternal Aunt        throat cancer  . Breast cancer Paternal Aunt        61  . Breast cancer Paternal Aunt        26  . Breast cancer Paternal Aunt        21    SOCIAL HX: Non-smoker   Current Outpatient Medications:  .  Cholecalciferol (VITAMIN D3) 50 MCG (2000 UT) capsule, Take 2,000 Units by mouth daily., Disp: , Rfl:  .  Cod Liver Oil CAPS, Take 1 capsule by mouth at bedtime. , Disp: , Rfl:  .  fluticasone (FLONASE) 50 MCG/ACT nasal spray, Place 2 sprays into both nostrils daily as needed. , Disp: , Rfl:  .  LINZESS 290 MCG CAPS capsule, , Disp: , Rfl:  .  montelukast (SINGULAIR) 10 MG tablet, TAKE 1 TABLET BY MOUTH EVERYDAY AT BEDTIME, Disp: 90 tablet, Rfl: 1 .  SUMAtriptan (IMITREX) 50 MG tablet, TAKE ONE TABLET BY MOUTH AT ONSET OF MIGRAINE - MAY REPEAT DOSE IN 2 HOURS IF HEADACHE PERSISTS OR RECURS, Disp: 7 tablet, Rfl: 0 .  SYNTHROID 75 MCG tablet, TAKE 1 TABLET (75 MCG TOTAL) BY MOUTH DAILY BEFORE BREAKFAST., Disp: 90 tablet, Rfl: 1  EXAM:  VITALS per patient if applicable:  GENERAL: alert, oriented, appears well and in no acute distress  HEENT: atraumatic, conjunttiva clear, no obvious abnormalities on inspection of external nose and ears  NECK: normal movements of the head and neck  LUNGS: on inspection no signs of respiratory distress, breathing rate appears normal, no obvious gross SOB, gasping or wheezing  CV: no obvious cyanosis  MS: moves all visible extremities without noticeable abnormality  PSYCH/NEURO: pleasant and cooperative, no obvious depression or anxiety, speech and thought processing  grossly intact  ASSESSMENT AND PLAN:  Discussed the following assessment and plan:  Arthralgia Chronic intermittent issue.  Discussed this could represent osteoarthritis versus some other type of arthritis.  She does report tension knots which could be an indicator of a myofascial issue.  We will check lab work to evaluate for underlying causes of arthritis.  Consider physical therapy if lab work is unremarkable.   Orders Placed This Encounter  Procedures  . Rheumatoid Factor    Standing Status:   Future    Standing Expiration Date:   09/29/2020  . Antinuclear Antib (ANA)    Standing Status:   Future    Standing Expiration Date:   09/29/2020  . Cyclic citrul peptide antibody, IgG ([facility])    Standing Status:  Future    Standing Expiration Date:   09/29/2020  . Sedimentation rate    Standing Status:   Future    Standing Expiration Date:   09/29/2020  . Comp Met (CMET)    Standing Status:   Future    Standing Expiration Date:   09/29/2020  . CK (Creatine Kinase)    Standing Status:   Future    Standing Expiration Date:   09/29/2020    No orders of the defined types were placed in this encounter.    I discussed the assessment and treatment plan with the patient. The patient was provided an opportunity to ask questions and all were answered. The patient agreed with the plan and demonstrated an understanding of the instructions.   The patient was advised to call back or seek an in-person evaluation if the symptoms worsen or if the condition fails to improve as anticipated.    Tommi Rumps, MD

## 2019-10-02 ENCOUNTER — Ambulatory Visit
Admission: RE | Admit: 2019-10-02 | Discharge: 2019-10-02 | Disposition: A | Discharge status: Home / Self Care | Payer: BC Managed Care – PPO | Source: Ambulatory Visit | Attending: Family Medicine | Admitting: Family Medicine

## 2019-10-02 DIAGNOSIS — Z1231 Encounter for screening mammogram for malignant neoplasm of breast: Secondary | ICD-10-CM

## 2019-10-02 IMAGING — MG DIGITAL SCREENING BILAT W/ TOMO W/ CAD
8 series · 8 of 24 positions shown · non-contrast
Comparison: Previous exam(s).

CLINICAL DATA: Screening. Strong family history of breast cancer,
including in 3 paternal aunts and paternal grandmother diagnosed
with breast cancer at age 35.

EXAM:
DIGITAL SCREENING BILATERAL MAMMOGRAM WITH TOMO AND CAD

[R CC synth-2D]
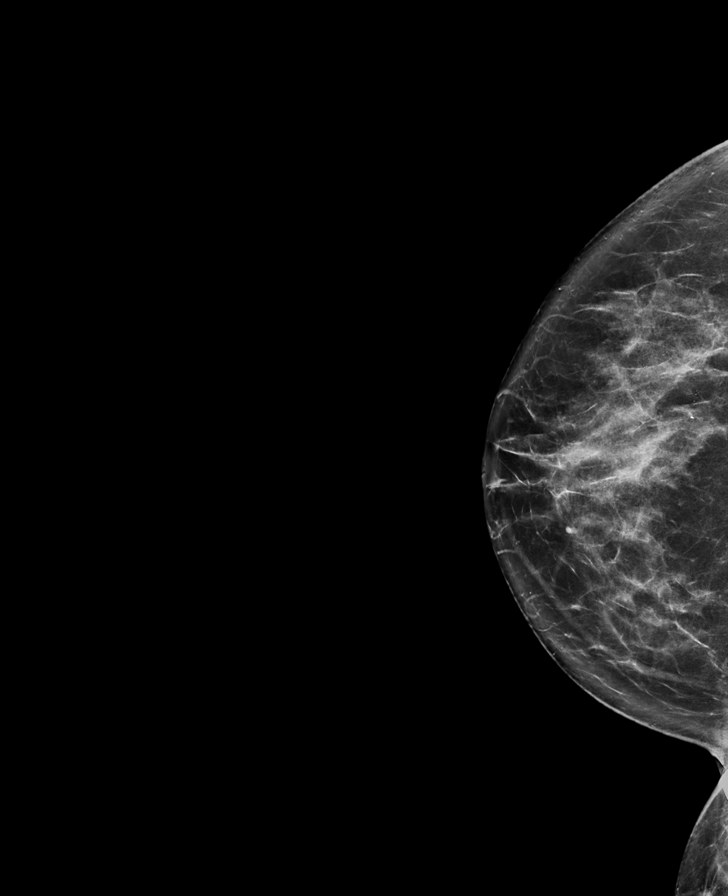

[L MLO synth-2D]
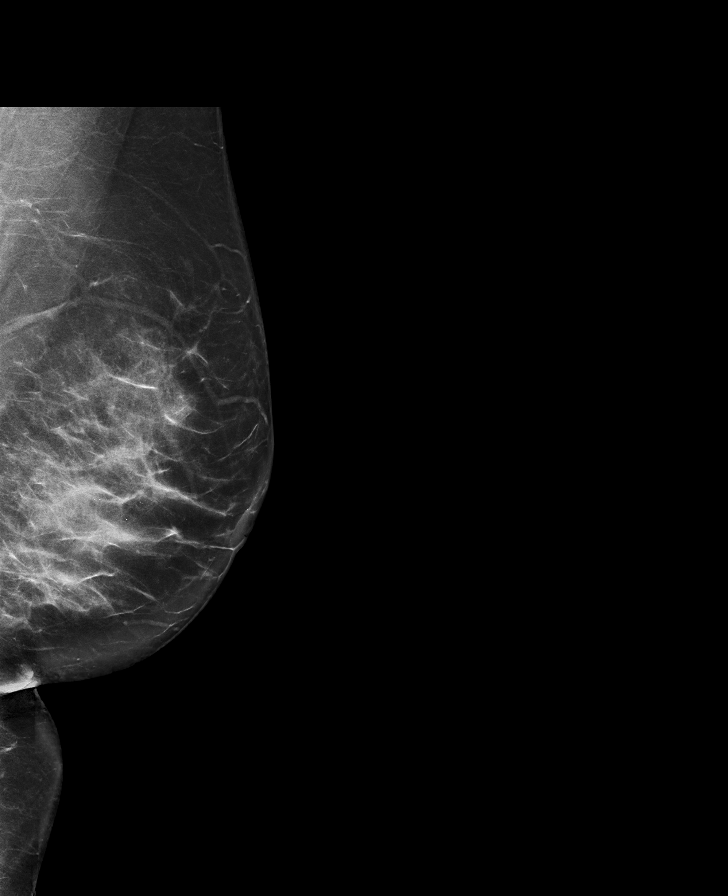

[R MLO synth-2D]
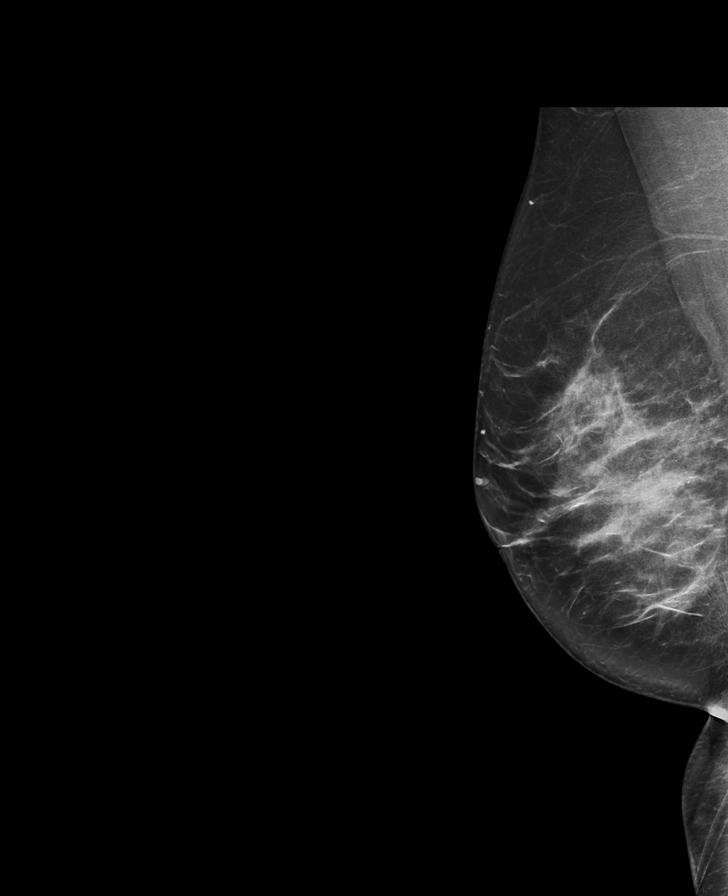

[L CC synth-2D]
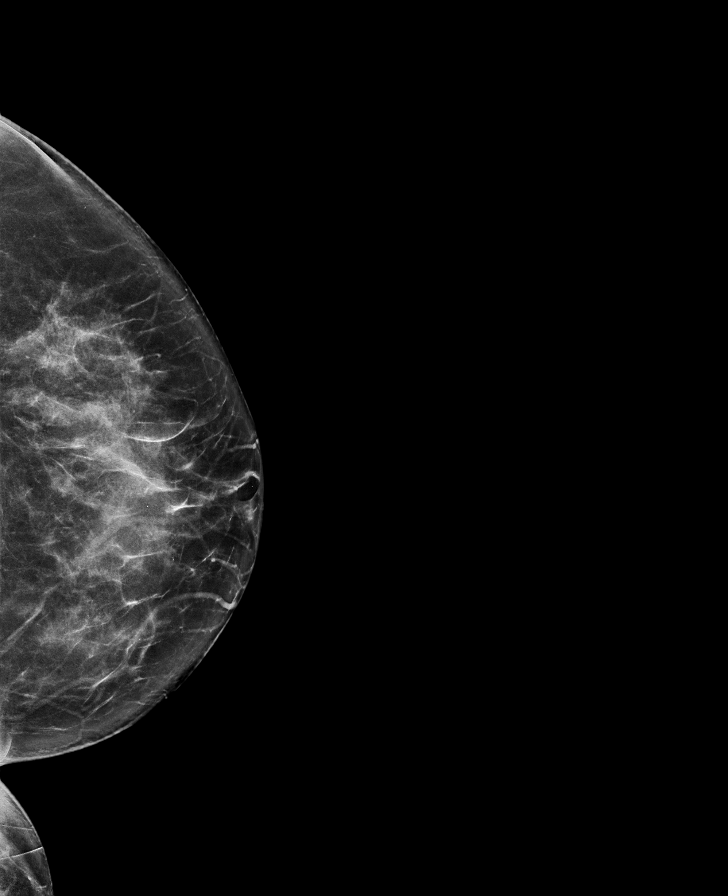

[R MLO tomo · tomo slice 39/78.0]
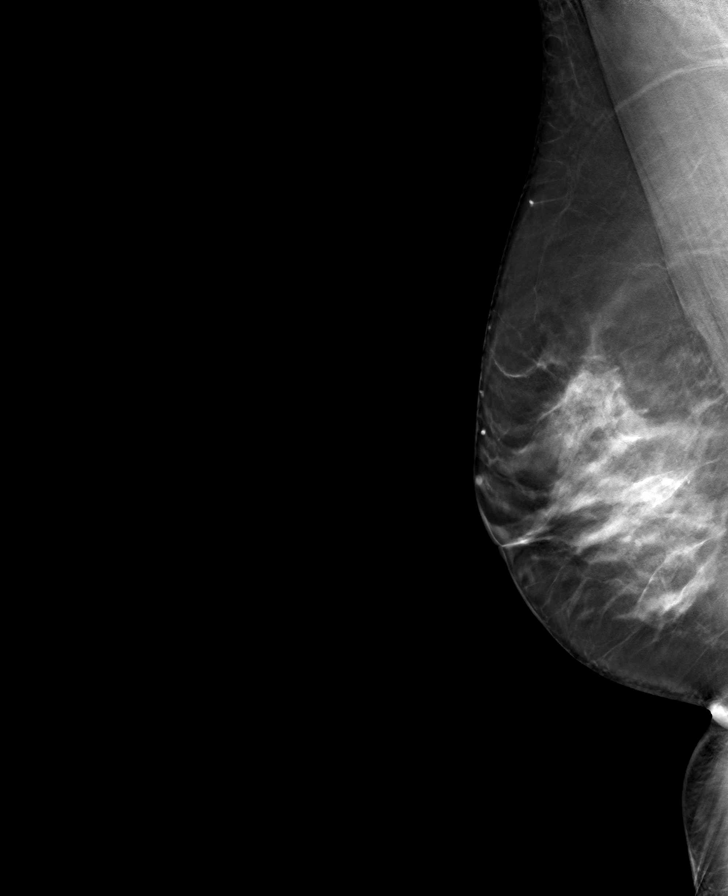

[R CC tomo · tomo slice 39/76.0]
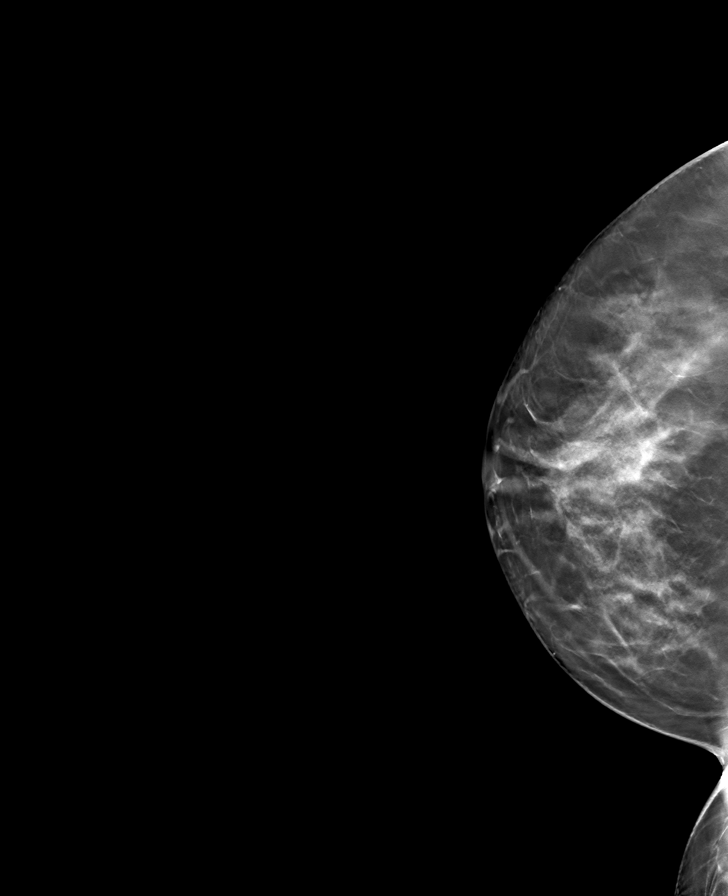

[L CC tomo · tomo slice 37/74.0]
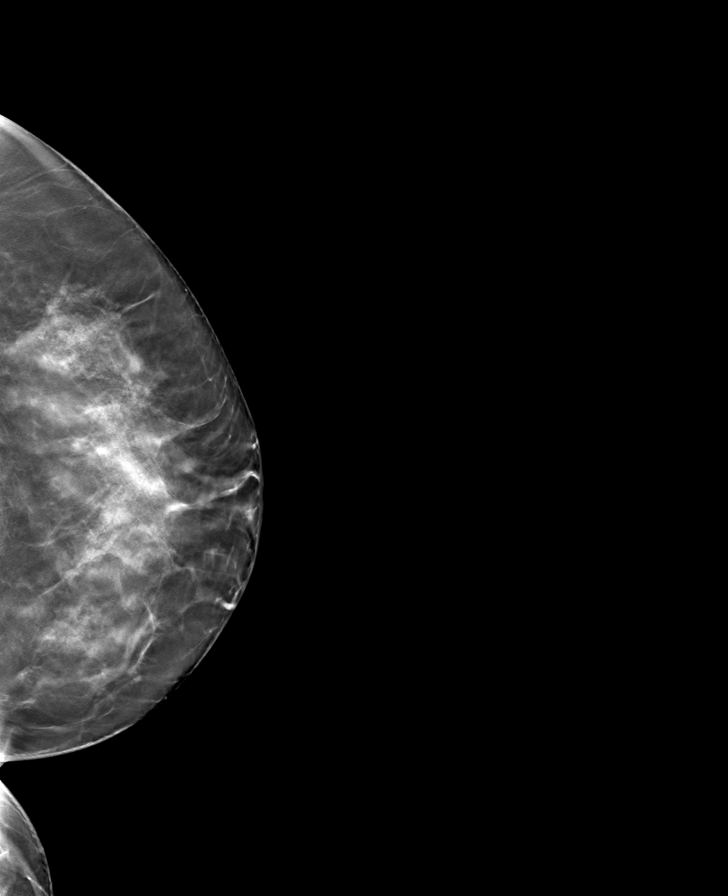

[L MLO tomo · tomo slice 45/88.0]
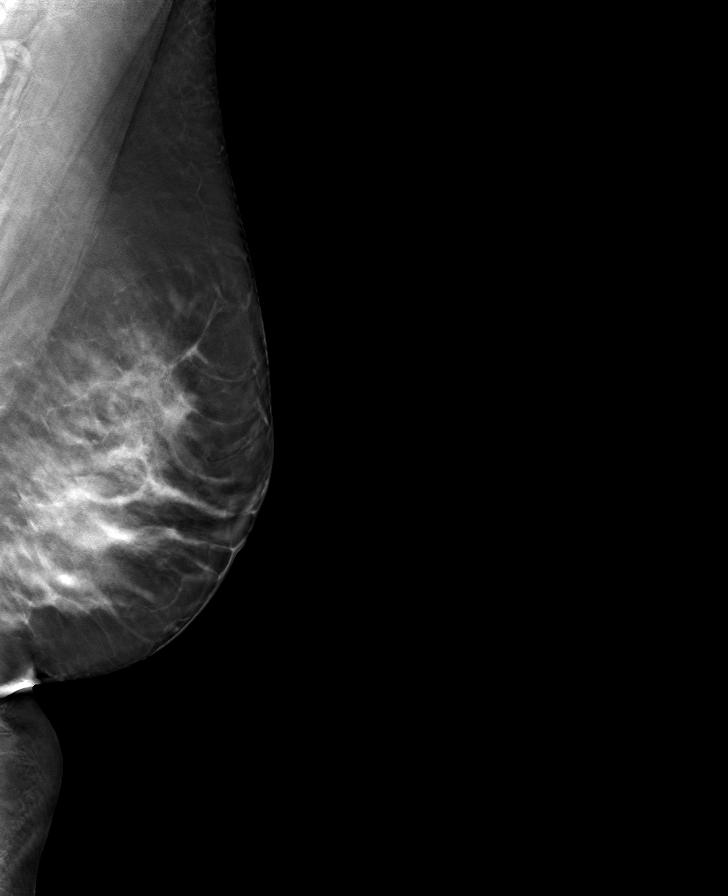

[8 of 24 positions shown; findings below may reference images not displayed]

ACR Breast Density Category c: The breast tissue is heterogeneously
dense, which may obscure small masses.
FINDINGS: There are no findings suspicious for malignancy. Images were
processed with CAD.
IMPRESSION: No mammographic evidence of malignancy. A result letter of this
screening mammogram will be mailed directly to the patient.

RECOMMENDATION:
1.  Screening mammogram in one year. (Code:[OD])

2. Strong family history of breast cancer, including in 3 paternal
aunts and paternal grandmother diagnosed with breast cancer at age
35. The American Cancer Society recommends annual MRI and
mammography in patients with an estimated lifetime risk of
developing breast cancer greater than 20 - 25%, or who are known or
suspected to be positive for the breast cancer gene.

BI-RADS CATEGORY  1: Negative.

## 2019-10-07 ENCOUNTER — Other Ambulatory Visit (INDEPENDENT_AMBULATORY_CARE_PROVIDER_SITE_OTHER): Payer: BC Managed Care – PPO

## 2019-10-07 ENCOUNTER — Other Ambulatory Visit: Payer: Self-pay

## 2019-10-07 DIAGNOSIS — M2550 Pain in unspecified joint: Secondary | ICD-10-CM | POA: Diagnosis not present

## 2019-10-07 LAB — COMPREHENSIVE METABOLIC PANEL
ALT: 10 U/L (ref 0–35)
AST: 17 U/L (ref 0–37)
Albumin: 4.4 g/dL (ref 3.5–5.2)
Alkaline Phosphatase: 59 U/L (ref 39–117)
BUN: 7 mg/dL (ref 6–23)
CO2: 29 mEq/L (ref 19–32)
Calcium: 9.4 mg/dL (ref 8.4–10.5)
Chloride: 102 mEq/L (ref 96–112)
Creatinine, Ser: 0.63 mg/dL (ref 0.40–1.20)
GFR: 104.41 mL/min (ref >60.00)
Glucose, Bld: 94 mg/dL (ref 70–99)
Potassium: 3.8 mEq/L (ref 3.5–5.1)
Sodium: 137 mEq/L (ref 135–145)
Total Bilirubin: 0.4 mg/dL (ref 0.2–1.2)
Total Protein: 7.3 g/dL (ref 6.0–8.3)

## 2019-10-07 LAB — CK: Total CK: 54 U/L (ref 7–177)

## 2019-10-07 LAB — SEDIMENTATION RATE: Sed Rate: 13 mm/hr (ref 0–20)

## 2019-10-08 ENCOUNTER — Other Ambulatory Visit: Payer: Self-pay | Admitting: Family Medicine

## 2019-10-08 DIAGNOSIS — Z803 Family history of malignant neoplasm of breast: Secondary | ICD-10-CM

## 2019-10-09 LAB — CYCLIC CITRUL PEPTIDE ANTIBODY, IGG: Cyclic Citrullin Peptide Ab: <16 UNITS

## 2019-10-09 LAB — RHEUMATOID FACTOR: Rheumatoid fact SerPl-aCnc: <14 IU/mL (ref <14)

## 2019-10-09 LAB — ANTI-NUCLEAR AB-TITER (ANA TITER): ANA Titer 1: 1:40 {titer} — ABNORMAL HIGH

## 2019-10-09 LAB — ANA: Anti Nuclear Antibody (ANA): POSITIVE — AB

## 2019-10-21 ENCOUNTER — Encounter: Payer: Self-pay | Admitting: Family Medicine

## 2019-10-22 ENCOUNTER — Encounter: Payer: Self-pay | Admitting: Family Medicine

## 2019-10-22 DIAGNOSIS — R768 Other specified abnormal immunological findings in serum: Secondary | ICD-10-CM

## 2019-11-06 DIAGNOSIS — R768 Other specified abnormal immunological findings in serum: Secondary | ICD-10-CM | POA: Diagnosis not present

## 2019-11-06 DIAGNOSIS — M25522 Pain in left elbow: Secondary | ICD-10-CM | POA: Diagnosis not present

## 2019-11-06 DIAGNOSIS — M2550 Pain in unspecified joint: Secondary | ICD-10-CM | POA: Diagnosis not present

## 2019-11-06 DIAGNOSIS — Z13820 Encounter for screening for osteoporosis: Secondary | ICD-10-CM | POA: Diagnosis not present

## 2019-11-25 ENCOUNTER — Ambulatory Visit (INDEPENDENT_AMBULATORY_CARE_PROVIDER_SITE_OTHER): Payer: BC Managed Care – PPO | Admitting: Obstetrics and Gynecology

## 2019-11-25 ENCOUNTER — Encounter: Payer: Self-pay | Admitting: Obstetrics and Gynecology

## 2019-11-25 ENCOUNTER — Other Ambulatory Visit: Payer: Self-pay

## 2019-11-25 VITALS — BP 102/60 | Ht 60.0 in | Wt 140.0 lb

## 2019-11-25 DIAGNOSIS — Z01419 Encounter for gynecological examination (general) (routine) without abnormal findings: Secondary | ICD-10-CM | POA: Diagnosis not present

## 2019-11-25 DIAGNOSIS — Z0000 Encounter for general adult medical examination without abnormal findings: Secondary | ICD-10-CM

## 2019-11-25 DIAGNOSIS — Z9189 Other specified personal risk factors, not elsewhere classified: Secondary | ICD-10-CM

## 2019-11-25 DIAGNOSIS — Z803 Family history of malignant neoplasm of breast: Secondary | ICD-10-CM | POA: Diagnosis not present

## 2019-11-25 DIAGNOSIS — N951 Menopausal and female climacteric states: Secondary | ICD-10-CM

## 2019-11-25 DIAGNOSIS — Z1231 Encounter for screening mammogram for malignant neoplasm of breast: Secondary | ICD-10-CM

## 2019-11-25 NOTE — Patient Instructions (Addendum)
Institute of Medicine Recommended Dietary Allowances for Calcium and Vitamin D  Age (yr) Calcium Recommended Dietary Allowance (mg/day) Vitamin D Recommended Dietary Allowance (international units/day)  9-18 1,300 600  19-50 1,000 600  51-70 1,200 600  71 and older 1,200 800  Data from Institute of Medicine. Dietary reference intakes: calcium, vitamin D. [place], [place]: National Academies Press; 2011.     Exercising to Stay Healthy To become healthy and stay healthy, it is recommended that you do moderate-intensity and vigorous-intensity exercise. You can tell that you are exercising at a moderate intensity if your heart starts beating faster and you start breathing faster but can still hold a conversation. You can tell that you are exercising at a vigorous intensity if you are breathing much harder and faster and cannot hold a conversation while exercising. Exercising regularly is important. It has many health benefits, such as:  Improving overall fitness, flexibility, and endurance.  Increasing bone density.  Helping with weight control.  Decreasing body fat.  Increasing muscle strength.  Reducing stress and tension.  Improving overall health. How often should I exercise? Choose an activity that you enjoy, and set realistic goals. Your health care provider can help you make an activity plan that works for you. Exercise regularly as told by your health care provider. This may include:  Doing strength training two times a week, such as: ? Lifting weights. ? Using resistance bands. ? Push-ups. ? Sit-ups. ? Yoga.  Doing a certain intensity of exercise for a given amount of time. Choose from these options: ? A total of 150 minutes of moderate-intensity exercise every week. ? A total of 75 minutes of vigorous-intensity exercise every week. ? A mix of moderate-intensity and vigorous-intensity exercise every week. Children, pregnant women, people who have not exercised  regularly, people who are overweight, and older adults may need to talk with a health care provider about what activities are safe to do. If you have a medical condition, be sure to talk with your health care provider before you start a new exercise program. What are some exercise ideas? Moderate-intensity exercise ideas include:  Walking 1 mile (1.6 km) in about 15 minutes.  Biking.  Hiking.  Golfing.  Dancing.  Water aerobics. Vigorous-intensity exercise ideas include:  Walking 4.5 miles (7.2 km) or more in about 1 hour.  Jogging or running 5 miles (8 km) in about 1 hour.  Biking 10 miles (16.1 km) or more in about 1 hour.  Lap swimming.  Roller-skating or in-line skating.  Cross-country skiing.  Vigorous competitive sports, such as football, basketball, and soccer.  Jumping rope.  Aerobic dancing. What are some everyday activities that can help me to get exercise?  Yard work, such as: ? Pushing a lawn mower. ? Raking and bagging leaves.  Washing your car.  Pushing a stroller.  Shoveling snow.  Gardening.  Washing windows or floors. How can I be more active in my day-to-day activities?  Use stairs instead of an elevator.  Take a walk during your lunch break.  If you drive, park your car farther away from your work or school.  If you take public transportation, get off one stop early and walk the rest of the way.  Stand up or walk around during all of your indoor phone calls.  Get up, stretch, and walk around every 30 minutes throughout the day.  Enjoy exercise with a friend. Support to continue exercising will help you keep a regular routine of activity. What guidelines can   I follow while exercising?  Before you start a new exercise program, talk with your health care provider.  Do not exercise so much that you hurt yourself, feel dizzy, or get very short of breath.  Wear comfortable clothes and wear shoes with good support.  Drink plenty of  water while you exercise to prevent dehydration or heat stroke.  Work out until your breathing and your heartbeat get faster. Where to find more information  U.S. Department of Health and Human Services: [employer].at  Centers for Disease Control and Prevention (CDC): http://www.wolf.info/ Summary  Exercising regularly is important. It will improve your overall fitness, flexibility, and endurance.  Regular exercise also will improve your overall health. It can help you control your weight, reduce stress, and improve your bone density.  Do not exercise so much that you hurt yourself, feel dizzy, or get very short of breath.  Before you start a new exercise program, talk with your health care provider. This information is not intended to replace advice given to you by your health care provider. Make sure you discuss any questions you have with your health care provider. Document Revised: 08/18/2017 Document Reviewed: 07/27/2017 Elsevier Patient Education  2020 [employer].

## 2019-11-25 NOTE — Progress Notes (Signed)
Gynecology Annual Exam  PCP: Leone Haven, MD  Chief Complaint:  Chief Complaint  Patient presents with  . Gynecologic Exam    History of Present Illness: Patient is a 39 y.o. [id number] presents for annual exam. The patient has no complaints today.   LMP: No LMP recorded. Patient has had a hysterectomy. Postcoital Bleeding: occasionally small amount depending on position Dysmenorrhea: no  The patient is sexually active. She currently uses status post hysterectomy for contraception. She has some mild position dependant dyspareunia.  The patient does perform self breast exams.  There is notable family history of breast or ovarian cancer in her family.  The patient denies current symptoms of depression.   GAD-7 : 5 PHQ-9: 7  She reports that she has had continued issues with constipation or diarrhea.  She is supposed to take Linzess twice a day and MiraLAX once a day.  She reports that she has generally been taking linzess about once a day and plans to improve her compliance to this medication.  She also plans to return to her previous dietary habits of drinking smoothies regularly and increased fiber intake.  She has plans to follow-up with GI regarding the symptoms.  She reports that her urinary incontinence has greatly improved.  She occasionally will have leakage of urine with sneezing but she is able to do duck things like jumping jacks without leaking urine.  Review of Systems: Review of Systems  Constitutional: Negative for chills, fever, malaise/fatigue and weight loss.  HENT: Negative for congestion, hearing loss and sinus pain.   Eyes: Negative for blurred vision and double vision.  Respiratory: Negative for cough, sputum production, shortness of breath and wheezing.   Cardiovascular: Negative for chest pain, palpitations, orthopnea and leg swelling.  Gastrointestinal: Negative for abdominal pain, constipation, diarrhea, nausea and vomiting.  Genitourinary: Negative  for dysuria, flank pain, frequency, hematuria and urgency.  Musculoskeletal: Negative for back pain, falls and joint pain.  Skin: Negative for itching and rash.  Neurological: Negative for dizziness and headaches.  Psychiatric/Behavioral: Negative for depression, substance abuse and suicidal ideas. The patient is not nervous/anxious.     Past Medical History:  Patient Active Problem List   Diagnosis Date Noted  . Arthralgia 09/30/2019  . Allergic rhinitis 05/06/2019  . Hypocalcemia 12/12/2018  . Constipation   . Calculus of gallbladder without cholecystitis without obstruction   . Breast pain, right 08/16/2018  . Prolapse of female pelvic organs 08/02/2018  . Rectocele 08/02/2018  . H. pylori infection 06/19/2018  . Dyspepsia   . Muscle ache 04/23/2018  . Paresthesia 04/23/2018  . Anxiety 04/23/2018  . Tension headache 04/23/2018  . Hyperlipidemia 05/02/2017  . Family history of breast cancer 12/15/2016    My Risk negative 4/18. IBIS=18.8%/riskscore=21.9%   . Annual physical exam 04/01/2016  . History of thyroid cancer 06/27/2014    2007. S/p thyroidectomy and radioactive iodine. Previous followed by Dr. Jeb Levering   . Hypothyroidism 06/27/2014  . Migraine 09/23/2013    Past Surgical History:  Past Surgical History:  Procedure Laterality Date  . ANAL RECTAL MANOMETRY N/A 10/12/2018   Procedure: ANO RECTAL MANOMETRY;  Surgeon: Mauri Pole, MD;  Location: [facility];  Service: Endoscopy;  Laterality: N/A;  . ANTERIOR AND POSTERIOR REPAIR WITH SACROSPINOUS FIXATION N/A 08/02/2018   Procedure: ANTERIOR AND POSTERIOR REPAIR;  Surgeon: Homero Fellers, MD;  Location: [facility];  Service: Gynecology;  Laterality: N/A;  . BACK SURGERY  1997   scoliosis  .  CHOLECYSTECTOMY N/A 09/21/2018   Procedure: LAPAROSCOPIC CHOLECYSTECTOMY;  Surgeon: Fredirick Maudlin, MD;  Location: [facility];  Service: General;  Laterality: N/A;  . COLONOSCOPY    . ESOPHAGOGASTRODUODENOSCOPY (EGD)  WITH PROPOFOL N/A 06/12/2018   Procedure: ESOPHAGOGASTRODUODENOSCOPY (EGD) WITH PROPOFOL;  Surgeon: Lin Landsman, MD;  Location: [place];  Service: Gastroenterology;  Laterality: N/A;  . HERNIA REPAIR  1990   bilateral inguinal  . PUBOVAGINAL SLING N/A 08/02/2018   Procedure: PUBO-VAGINAL SLING- TVT;  Surgeon: Homero Fellers, MD;  Location: [facility];  Service: Gynecology;  Laterality: N/A;  . THYROIDECTOMY  2007   thyroid cancer  . TOTAL ABDOMINAL HYSTERECTOMY  2008   dymenorrhea    Gynecologic History:  No LMP recorded. Patient has had a hysterectomy. Contraception: status post hysterectomy Last Pap: Results were: 2018 NIL  Last mammogram: 2021 Results were: BI-RAD I  Obstetric [id number]  Family History:  Family History  Problem Relation Age of Onset  . Hyperlipidemia Father   . Hypertension Father   . Clotting disorder Sister   . Hepatitis B Brother   . Cancer Paternal Grandmother 8       breast cancer? with mets  . Breast cancer Paternal Grandmother 26  . Cancer Mother        Cervical Cancer  . Cancer Maternal Aunt        throat cancer  . Breast cancer Paternal Aunt        38  . Breast cancer Paternal Aunt        43  . Breast cancer Paternal Aunt        64    Social History:  Social History   Socioeconomic History  . Marital status: Married    Spouse name: Audelia Acton  . Number of children: 2  . Years of education: 59  . Highest education level: Not on file  Occupational History  . Occupation: Stay at home mom  Tobacco Use  . Smoking status: Never Smoker  . Smokeless tobacco: Never Used  Substance and Sexual Activity  . Alcohol use: Yes    Comment: Occasionally - very rarely  . Drug use: No  . Sexual activity: Yes    Birth control/protection: Surgical    Comment: Hysterectomy  Other Topics Concern  . Not on file  Social History Narrative   Chasity grew up in [place]. She currently lives in [place] with her  husband, Audelia Acton, and their 2 sons ([place]  and [employer] ). 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 Yasuko belongs to the [employer]. She enjoys outdoor activities on her spare time.   Social Determinants of Health   Financial Resource Strain:   . Difficulty of Paying Living Expenses: Not on file  Food Insecurity:   . Worried About Charity fundraiser in the Last Year: Not on file  . Ran Out of Food in the Last Year: Not on file  Transportation Needs:   . Lack of Transportation (Medical): Not on file  . Lack of Transportation (Non-Medical): Not on file  Physical Activity:   . Days of Exercise per Week: Not on file  . Minutes of Exercise per Session: Not on file  Stress:   . Feeling of Stress : Not on file  Social Connections:   . Frequency of Communication with Friends and Family: Not on file  . Frequency of Social Gatherings with Friends and Family: Not on file  .  Attends Religious Services: Not on file  . Active Member of Clubs or Organizations: Not on file  . Attends Archivist Meetings: Not on file  . Marital Status: Not on file  Intimate Partner Violence:   . Fear of Current or Ex-Partner: Not on file  . Emotionally Abused: Not on file  . Physically Abused: Not on file  . Sexually Abused: Not on file    Allergies:  Allergies  Allergen Reactions  . Darvon [Propoxyphene] Other (See Comments)    Hallucinations   . Onion Other (See Comments)    RAW-MIGRAINES  . Percocet [Oxycodone-Acetaminophen] Hives  . Vicodin [Hydrocodone-Acetaminophen] Hives    Medications: Prior to Admission medications   Medication Sig Start Date End Date Taking? Authorizing Provider  Cholecalciferol (VITAMIN D3) 50 MCG (2000 UT) capsule Take 2,000 Units by mouth daily.   Yes [provider]  Cod Liver Oil CAPS Take 1 capsule by mouth at bedtime.    Yes [provider]  diclofenac Sodium (VOLTAREN) 1 % GEL Apply 2 g  topically 4 (four) times daily. 11/06/19  Yes [provider]  fluticasone (FLONASE) 50 MCG/ACT nasal spray Place 2 sprays into both nostrils daily as needed.    Yes [provider]  Rolan Lipa 290 MCG CAPS capsule  07/30/19  Yes [provider]  montelukast (SINGULAIR) 10 MG tablet TAKE 1 TABLET BY MOUTH EVERYDAY AT BEDTIME 09/23/19  Yes Burnard Hawthorne, FNP  Multiple Vitamin (MULTI-VITAMIN) tablet Take by mouth.   Yes [provider]  SUMAtriptan (IMITREX) 50 MG tablet TAKE ONE TABLET BY MOUTH AT ONSET OF MIGRAINE - MAY REPEAT DOSE IN 2 HOURS IF HEADACHE PERSISTS OR RECURS 09/10/19  Yes Leone Haven, MD  SYNTHROID 75 MCG tablet TAKE 1 TABLET (75 MCG TOTAL) BY MOUTH DAILY BEFORE BREAKFAST. 09/02/19  Yes Leone Haven, MD    Physical Exam Vitals: Blood pressure 102/60, height 5' (1.524 m), weight 140 lb (63.5 kg).  General: NAD HEENT: normocephalic, anicteric Thyroid: no enlargement, no palpable nodules Pulmonary: No increased work of breathing, CTAB Cardiovascular: RRR, distal pulses 2+ Breast: Breast symmetrical, no tenderness, no palpable nodules or masses, no skin or nipple retraction present, no nipple discharge.  No axillary or supraclavicular lymphadenopathy. Abdomen: NABS, soft, non-tender, non-distended.  Umbilicus without lesions.  No hepatomegaly, splenomegaly or masses palpable. No evidence of hernia  Genitourinary:  External: Normal external female genitalia.  Normal urethral meatus, normal Bartholin's and Skene's glands.    Vagina: Normal vaginal mucosa, no evidence of prolapse. Surgical incisions have healed well.    Cervix: absent  Uterus: absent  Adnexa: ovaries non-enlarged, no adnexal masses  Rectal: deferred  Lymphatic: no evidence of inguinal lymphadenopathy Extremities: no edema, erythema, or tenderness Neurologic: Grossly intact Psychiatric: mood appropriate, affect full  Female chaperone present for pelvic and breast   portions of the physical exam    Assessment: 39 y.o. [id number] routine annual exam  Plan: Problem List Items Addressed This Visit      Other   Family history of breast cancer   Relevant Orders   MR BREAST BILATERAL WO CONTRAST    Other Visit Diagnoses    Healthcare maintenance    -  Primary   Breast cancer screening by mammogram       Relevant Orders   MR BREAST BILATERAL WO CONTRAST   Increased risk of breast cancer       Relevant Orders   MR BREAST BILATERAL WO CONTRAST   Normal  pelvic exam       Normal breast exam       Hot flashes due to menopause       Relevant Orders   FSH   Estradiol      1) Mammogram - recommend yearly screening mammogram.  Mammogram is up to date- Breast MRI ordered for lifetime risk of breast cancer > 20%   2) STI screening  was notoffered and therefore not obtained  3) ASCCP guidelines and rational discussed.  Patient opts for discontinue secondary to prior hysterectomy screening interval  4) Contraception - the patient is currently using  status post hysterectomy.  She is happy with her current form of contraception and plans to continue  5) Colonoscopy -- Screening recommended starting at age 48 for average risk individuals, age 67 for individuals deemed at increased risk (including African Americans) and recommended to continue until age 39.  For patient age 90-85 individualized approach is recommended.  Gold standard screening is via colonoscopy, Cologuard screening is an acceptable alternative for patient unwilling or unable to undergo colonoscopy.  "Colorectal cancer screening for average?risk adults: 2018 guideline update from the American Cancer Society"[place]: A Cancer Journal for Clinicians: Feb 15, 2017   6) Routine healthcare maintenance including cholesterol, diabetes screening discussed managed by PCP   7) Hot flashes 2-3 times a day- difficulty sleeping- check FSH and estradiol. Follow up is results suggestive of premature menopause.    8) Return in about 1 year (around 11/24/2020) for annual.   Adrian Prows MD [place] 11/25/2019 9:07 AM

## 2019-11-26 LAB — ESTRADIOL: Estradiol: 44.6 pg/mL

## 2019-11-26 LAB — FOLLICLE STIMULATING HORMONE: FSH: 7.5 m[IU]/mL

## 2019-11-29 ENCOUNTER — Telehealth: Payer: Self-pay | Admitting: Genetic Counselor

## 2019-11-29 NOTE — Telephone Encounter (Signed)
Contacted patient to verify mychart video visit for pre reg 

## 2019-12-02 ENCOUNTER — Inpatient Hospital Stay: Payer: BC Managed Care – PPO | Attending: Genetic Counselor | Admitting: Genetic Counselor

## 2019-12-02 DIAGNOSIS — Z808 Family history of malignant neoplasm of other organs or systems: Secondary | ICD-10-CM

## 2019-12-02 DIAGNOSIS — Z800 Family history of malignant neoplasm of digestive organs: Secondary | ICD-10-CM

## 2019-12-02 DIAGNOSIS — Z803 Family history of malignant neoplasm of breast: Secondary | ICD-10-CM

## 2019-12-02 DIAGNOSIS — Z8049 Family history of malignant neoplasm of other genital organs: Secondary | ICD-10-CM

## 2019-12-02 DIAGNOSIS — Z85850 Personal history of malignant neoplasm of thyroid: Secondary | ICD-10-CM

## 2019-12-03 ENCOUNTER — Encounter: Payer: Self-pay | Admitting: Genetic Counselor

## 2019-12-03 DIAGNOSIS — Z808 Family history of malignant neoplasm of other organs or systems: Secondary | ICD-10-CM | POA: Insufficient documentation

## 2019-12-03 DIAGNOSIS — Z8049 Family history of malignant neoplasm of other genital organs: Secondary | ICD-10-CM | POA: Insufficient documentation

## 2019-12-03 DIAGNOSIS — Z800 Family history of malignant neoplasm of digestive organs: Secondary | ICD-10-CM | POA: Insufficient documentation

## 2019-12-03 NOTE — Progress Notes (Signed)
REFERRING PROVIDER: Leone Haven, MD [address] [place],  [place] [address]  PRIMARY PROVIDER:  Leone Haven, MD  PRIMARY REASON FOR VISIT:  1. Family history of breast cancer   2. History of thyroid cancer   3. Family history of skin cancer   4. Family history of cervical cancer   5. Family history of throat cancer      I connected with Sarah Moran on 12/03/2019 at 2:00 pm EDT by MyChart video conference and verified that I am speaking with the correct person using two identifiers.   Patient location: Home Provider location: [facility] office  HISTORY OF PRESENT ILLNESS:   Sarah Moran, a 39 y.o. female, was seen for a [place] cancer genetics consultation at the request of Dr. Caryl Bis due to a personal and family history of cancer and negative genetic testing.  Sarah Moran presents to clinic today to discuss the possibility of a hereditary predisposition to cancer, genetic testing, and to further clarify her future cancer risks, as well as potential cancer risks for family members.   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 The treatment plan included thyroidectomy and RAI therapy.    CANCER HISTORY:  Oncology History   No history exists.    RISK FACTORS:  Menarche was at age 81.  First live birth at age 37.  OCP use for approximately 5-6 years.  Ovaries intact: yes.  Hysterectomy: yes.  Menopausal status: premenopausal.  HRT use: 0 years. Colonoscopy: yes; normal per patient. Mammogram within the last year: yes. Number of breast biopsies: 0.   Past Medical History:  Diagnosis Date  . Allergy   . Anal fissure   . Anxiety    no meds  . Chronic pelvic pain in female   . Family history of breast cancer    My Risk neg 5/18  . Family history of cervical cancer   . Family history of skin cancer   . Family history of throat cancer   . Frequent headaches   . Gallstone   . Gallstones   . Genetic testing  of female 01/2017   My Risk/BRCA neg  . GERD (gastroesophageal reflux disease)    well controlled. Takes gingerroot when symptoms  . Hypothyroidism    s/p thyroidectomy - levothyroxine 75 mcg daily  . IBS (irritable bowel syndrome)    constipation - takes miralax daily  . Increased risk of breast cancer 01/2017   IBIS=18.8%/riskscore=21.9%  . Liver cyst 04/2018  . Migraine    takes excedrin. She has tried imitrex in the past   . Personal history of radiation therapy   . Seizure ([place]) 1997    x 1-age 51-after surgery for scoliosis  . Thyroid cancer ([place]) 2007   s/p thyroidectomy    Past Surgical History:  Procedure Laterality Date  . ANAL RECTAL MANOMETRY N/A 10/12/2018   Procedure: ANO RECTAL MANOMETRY;  Surgeon: Mauri Pole, MD;  Location: [facility] END[facility]COPY;  Service: Endoscopy;  Laterality: N/A;  . ANTERIOR AND POSTERIOR REPAIR WITH SACROSPINOUS FIXATION N/A 08/02/2018   Procedure: ANTERIOR AND POSTERIOR REPAIR;  Surgeon: Homero Fellers, MD;  Location: [facility] ORS;  Service: Gynecology;  Laterality: N/A;  . BACK SURGERY  1997   scoliosis  . CHOLECYSTECTOMY N/A 09/21/2018   Procedure: LAPAROSCOPIC CHOLECYSTECTOMY;  Surgeon: Fredirick Maudlin, MD;  Location: [facility] ORS;  Service: General;  Laterality: N/A;  . COLONOSCOPY    . ESOPHAGOGASTRODUODENOSCOPY (EGD) WITH  PROPOFOL N/A 06/12/2018   Procedure: ESOPHAGOGASTRODUODENOSCOPY (EGD) WITH PROPOFOL;  Surgeon: Lin Landsman, MD;  Location: [facility] ENDOSCOPY;  Service: Gastroenterology;  Laterality: N/A;  . HERNIA REPAIR  1990   bilateral inguinal  . PUBOVAGINAL SLING N/A 08/02/2018   Procedure: PUBO-VAGINAL SLING- TVT;  Surgeon: Homero Fellers, MD;  Location: [facility] ORS;  Service: Gynecology;  Laterality: N/A;  . THYROIDECTOMY  2007   thyroid cancer  . TOTAL ABDOMINAL HYSTERECTOMY  2008   dymenorrhea    Social History   Socioeconomic History  . Marital status: Married    Spouse name: Audelia Acton  . Number of children: 2    . Years of education: 50  . Highest education level: Not on file  Occupational History  . Occupation: Stay at home mom  Tobacco Use  . Smoking status: Never Smoker  . Smokeless tobacco: Never Used  Substance and Sexual Activity  . Alcohol use: Yes    Comment: Occasionally - very rarely  . Drug use: No  . Sexual activity: Yes    Birth control/protection: Surgical    Comment: Hysterectomy  Other Topics Concern  . Not on file  Social History Narrative   Bliss grew up in [place]. She currently lives in [place] with her husband, Audelia Acton, and their 2 sons ([place]  and [place] ). 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 Sondos belongs to the [employer]. She enjoys outdoor activities on her spare time.   Social Determinants of Health   Financial Resource Strain:   . Difficulty of Paying Living Expenses:   Food Insecurity:   . Worried About Charity fundraiser in the Last Year:   . Arboriculturist in the Last Year:   Transportation Needs:   . Film/video editor (Medical):   Marland Kitchen Lack of Transportation (Non-Medical):   Physical Activity:   . Days of Exercise per Week:   . Minutes of Exercise per Session:   Stress:   . Feeling of Stress :   Social Connections:   . Frequency of Communication with Friends and Family:   . Frequency of Social Gatherings with Friends and Family:   . Attends Religious Services:   . Active Member of Clubs or Organizations:   . Attends Archivist Meetings:   Marland Kitchen Marital Status:      FAMILY HISTORY:  We obtained a detailed, 4-generation family history.  Significant diagnoses are listed below: Family History  Problem Relation Age of Onset  . Hyperlipidemia Father   . Hypertension Father   . Clotting disorder Sister   . Hepatitis B Brother   . Breast cancer Paternal Grandmother 34       with mets  . Cervical cancer Mother        dx. in her early 25s  . Breast cancer Paternal  Aunt 25  . Throat cancer Paternal Aunt 12  . Breast cancer Paternal Aunt 40  . Breast cancer Paternal Aunt        50  . Skin cancer Paternal Aunt   . Cancer Cousin        unknown type dx. in her late 20s/early 60s  . Skin cancer Cousin        13 cancerous moles removed in her 67s   Sarah Moran has two sons, ages 40 and 47. She has a full-brother (age 33), a full-sister (age 110), and a maternal half-sister (age 77). None of  these family members have had cancer.  Sarah Moran mother is currently 73 and has a history of cervical cancer diagnosed in her early 10s. Sarah Moran has four maternal aunts and multiple maternal uncles, although she is not sure exactly how many. She does not have much information about this side of the family but had not heard of any maternal relatives having cancer. She does not have information about her maternal grandparents.  Sarah Moran father is currently 63 and has not had cancer. She has six paternal aunts and two paternal uncles. Her two uncles were murdered in their early 13s and did not have cancer. Three of her aunts had breast cancer. One aunt had breast cancer diagnosed at age 36, throat cancer diagnosed at age 16, and died at age 59. Another aunt had breast cancer diagnosed at age 70 and died at age 28. The third aunt is currently living in her 10s and has had breast cancer and skin cancer, diagnosed at unspecified ages. Sarah Moran has one paternal cousin who had an unknown cancer in her late 7s or early 52s, and another paternal cousin who had 2 cancer moles removed in her 64s. Her paternal grandmother had breast cancer diagnosed at age 78 and died in her 57s, and her paternal grandfather died in his 50s without a history of cancer.  Sarah Moran is unaware of previous family history of genetic testing for hereditary cancer risks. She does not know her ancestry. There is no reported Ashkenazi Jewish ancestry. There is no known consanguinity.  DISCUSSION:  We  discussed that, in general, most cancer is not inherited in families, but instead is sporadic or familial. Sporadic cancers occur by chance and typically happen at older ages (>50 years) as this type of cancer is caused by genetic changes acquired during an individual's lifetime. Some families have more cancers than would be expected by chance; however, the ages or types of cancer are not consistent with a known genetic mutation or known genetic mutations have been ruled out. This type of familial cancer is thought to be due to a combination of multiple genetic, environmental, hormonal, and lifestyle factors. While this combination of factors likely increases the risk of cancer, the exact source of this risk is not currently identifiable or testable.    We discussed that approximately 5-10% of breast cancer is hereditary, meaning that it is due to a mutation in a single gene that is passed down from generation to generation in a family. We discussed that identifying a hereditary cancer syndrome can be beneficial for several reasons, including knowing about other cancer risks, identifying potential screening and risk-reduction options that may be appropriate, and to understand if other family members could be at risk for cancer and allow them to undergo genetic testing.   GENETIC TEST RESULTS:  Sarah Moran had genetic testing completed on 01/02/2017 through the [facility] panel offered by [employer].  No pathogenic variants were detected.   The [facility] gene panel offered by [employer] included sequencing and deletion/duplication testing of the following 28 genes: APC, ATM, BARD1, BMPR1A, BRCA1, BRCA2, BRIP1, CDH1, CDK4, CDKN2A, CHEK2, EPCAM (large rearrangement only), MLH1, MSH2, MSH6, MUTYH, NBN, PALB2, PMS2, PTEN, RAD51C, RAD51D, SMAD4, STK11, and TP53. Sequencing was performed for select regions of POLE and POLD1, and large rearrangement analysis was performed for select regions  of GREM1.    A portion of the result report is included below for reference.     We discussed with  Sarah Moran that because current genetic testing is not perfect, it is possible there may be a gene mutation in one of these genes that current testing cannot detect, but that chance is small.  We also discussed, that there could be another gene that has not yet been discovered, or that we have not yet tested, that is responsible for the cancer diagnoses in the family. It is also possible there is a hereditary cause for the cancer in the family that Sarah Moran did not inherit and therefore was not identified in her testing.  Therefore, it is important to remain in touch with cancer genetics in the future so that we can continue to offer Sarah Moran the most up to date genetic testing.   CANCER SCREENING RECOMMENDATIONS: Sarah Moran's test result is considered negative (normal).  This means that we have not identified a hereditary cause for her personal and family history of cancer at this time. Most cancers happen by chance and this negative test suggests that her personal history of cancer may fall into this category. While reassuring, this does not definitively rule out a hereditary predisposition to cancer. It is still possible that there could be genetic mutations that are undetectable by current technology. There could be genetic mutations in genes that have not been tested or identified to increase cancer risk.  Therefore, it is recommended she continue to follow the cancer management and screening guidelines provided by her primary healthcare provider.   An individual's cancer risk and medical management are not determined by genetic test results alone. Overall cancer risk assessment incorporates additional factors, including personal medical history, family history, and any available genetic information that may result in a personalized plan for cancer prevention and surveillance.  Based on Ms.  Moran's personal and family history, as well as her genetic test results, statistical models (Tyrer-Cuzick and the [employer]) were used to estimate her risk of developing breast cancer. Tyrer-Cuzick estimates her lifetime risk of developing breast cancer to be approximately 17.2%.  This lifetime breast cancer risk is a preliminary estimate based on available information using one of several models endorsed by the [place] (ACS). The ACS recommends consideration of breast MRI screening as an adjunct to mammography for patients at high risk (defined as 20% or greater lifetime risk).  The [employer] model estimates her 5-year risk for breast cancer to be approximately 0.5%. Consideration for chemoprevention is recommended for women who have a 5-year breast cancer risk of 1.7% or greater.  We discussed that Ms. Wardrop's genetic test also included a breast cancer risk estimate, called riskscore, which estimated that her remaining lifetime risk to develop breast cancer is 21.9%. Riskscore, offered by [employer], analyzes 43 genetic markers combined with reported patient clinical and family history data to calculate an estimated remaining lifetime risk for breast cancer. We discussed that despite the differences in Ms. Un's breast cancer risk estimate between riskscore and Tyrer-Cuzick, it may be reasonable to consider breast MRI on the basis that her breast cancer risk calculated through riskscore was higher than 20%. However, we cautioned Ms. Thielman that insurance may deny coverage of breast MRI based on riskscore alone if Tyrer-Cuzick or other validated breast cancer risk model calculations are less than 20%.  Tyrer-Cuzick:   [employer]aker Janus Model:    RECOMMENDATIONS FOR FAMILY MEMBERS:  Individuals in this family might be at some increased risk of developing cancer, over the general population risk, simply due to the family history of cancer.  We  recommended women in this family have a yearly  mammogram beginning at age 67, or 60 years younger than the earliest onset of cancer, an annual clinical breast exam, and perform monthly breast self-exams. Women in this family should also have a gynecological exam as recommended by their primary provider. All family members should have a colonoscopy by age 16.  It is also possible there is a hereditary cause for the cancer in Ms. Deats's family that she did not inherit and therefore was not identified in her.  Based on Ms. Cooner's family history, we recommended her paternal aunt, who was diagnosed with breast and skin cancer, have genetic counseling and testing. Ms. Fahey will let us know if we can be of any assistance in coordinating genetic counseling and/or testing for this family member.   FOLLOW-UP: Lastly, we discussed with Ms. Lamontagne that cancer genetics is a rapidly advancing field and it is possible that new genetic tests will be appropriate for her and/or her family members in the future. We encouraged her to remain in contact with cancer genetics on an annual basis so we can update her personal and family histories and let her know of advances in cancer genetics that may benefit this family.   Our contact number was provided. Ms. Kaps's questions were answered to her satisfaction, and she knows she is welcome to call us at anytime with additional questions or concerns.   Clint Guy, MS, [facility] Genetic Counselor [email or web address] Phone: [phone]

## 2019-12-04 ENCOUNTER — Telehealth: Payer: Self-pay | Admitting: Family Medicine

## 2019-12-04 NOTE — Telephone Encounter (Signed)
-----   Message from Clint Guy sent at [id number]  9:41 AM EDT ----- Hi Dr. Caryl Bis,  No further genetic testing is recommended for Ms. Beverley at this time. We discussed the discrepancy between her lifetime risk for breast cancer as determined by the genetic test's "riskscore" (21.9%) and Tyrer-Cuzick (17.2%). It is reasonable to consider breast MRI based on the riskscore, but insurance companies often do not consider this a validated tool for breast cancer risk estimate and may deny coverage of the MRI if TC is less than 20%. Let me know if I can clarify anything.  Thanks! Raquel Sarna

## 2019-12-06 ENCOUNTER — Other Ambulatory Visit: Payer: Self-pay

## 2019-12-06 ENCOUNTER — Ambulatory Visit
Admission: RE | Admit: 2019-12-06 | Discharge: 2019-12-06 | Disposition: A | Discharge status: Home / Self Care | Payer: BC Managed Care – PPO | Source: Ambulatory Visit | Attending: Obstetrics and Gynecology | Admitting: Obstetrics and Gynecology

## 2019-12-06 DIAGNOSIS — Z803 Family history of malignant neoplasm of breast: Secondary | ICD-10-CM

## 2019-12-06 DIAGNOSIS — Z1231 Encounter for screening mammogram for malignant neoplasm of breast: Secondary | ICD-10-CM | POA: Diagnosis not present

## 2019-12-06 DIAGNOSIS — Z9189 Other specified personal risk factors, not elsewhere classified: Secondary | ICD-10-CM

## 2019-12-06 IMAGING — MR MR BREAST BILAT WO/W CM
2 of 9 series · 6 of 48 positions shown · IV contrast (6ml Gadavist)
Comparison: Previous exam(s).

CLINICAL DATA: High risk screening breast MRI. Lifetime risk of
breast cancer greater than 20% due to strong family history of
breast cancer.

LABS:  None.
EXAM:
BILATERAL BREAST MRI WITH AND WITHOUT CONTRAST
TECHNIQUE: Multiplanar, multisequence MR images of both breasts were obtained
prior to and following the intravenous administration of 6 ml of
Gadavist

[Series 2: T1 · axial · B · 1.5mm · 1.02mm/px · z∈[-83,+83]mm · 5 of 112 slices shown]
[im 1/112]
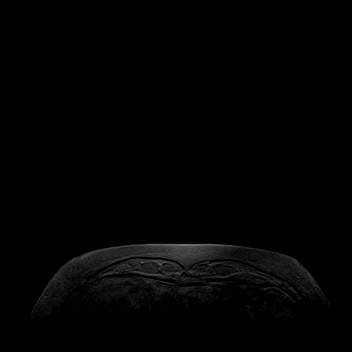
[im 28/112]
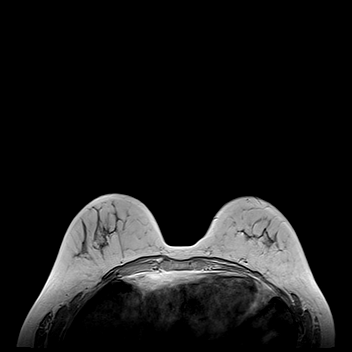
[im 56/112]
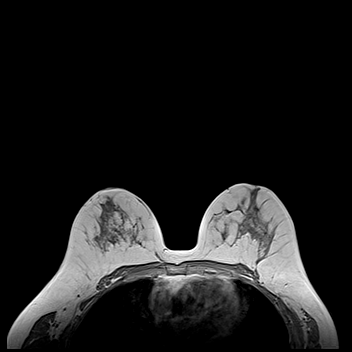
[im 84/112]
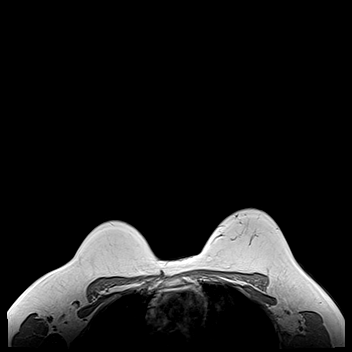
[im 112/112]
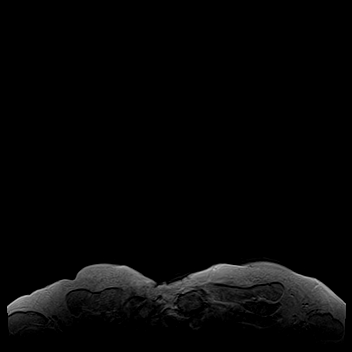

[Series 3: T2 · axial · B · 3.0mm · 1.02mm/px · 1 of 46 slices shown]
[im 1/46]
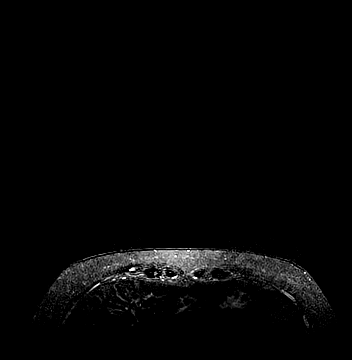

[6 of 48 positions shown; findings below may reference images not displayed]

Three-dimensional MR images were rendered by post-processing of the
original MR data on an independent workstation. The
three-dimensional MR images were interpreted, and findings are
reported in the following complete MRI report for this study. Three
dimensional images were evaluated at the independent [employer]
workstation
FINDINGS: Breast composition: c. Heterogeneous fibroglandular tissue.

Background parenchymal enhancement: Moderate.

Right breast: No mass or abnormal enhancement.

Left breast: No mass or abnormal enhancement.

Lymph nodes: No abnormal appearing lymph nodes.

Ancillary findings:  3 cm cyst over the left lobe of the liver.
IMPRESSION: Unremarkable breast MRI without evidence of malignancy.

RECOMMENDATION:
Recommend continued annual bilateral screening 3D mammography.

The American Cancer Society recommends annual MRI and mammography in
patients with an estimated lifetime risk of developing breast cancer
greater than 20 - 25%, or who are known or suspected to be positive
for the breast cancer gene.

BI-RADS CATEGORY  1: Negative.

## 2019-12-06 MED ORDER — GADOBUTROL 1 MMOL/ML IV SOLN
6.0000 mL | Freq: Once | INTRAVENOUS | Status: AC | PRN
  Administered 2019-12-06: 6 mL via INTRAVENOUS

## 2019-12-12 ENCOUNTER — Other Ambulatory Visit: Payer: Self-pay

## 2019-12-12 ENCOUNTER — Encounter: Payer: Self-pay | Admitting: Endocrinology

## 2019-12-12 ENCOUNTER — Ambulatory Visit: Payer: BC Managed Care – PPO | Admitting: Endocrinology

## 2019-12-12 VITALS — BP 120/72 | HR 69 | Wt 139.2 lb

## 2019-12-12 DIAGNOSIS — E039 Hypothyroidism, unspecified: Secondary | ICD-10-CM | POA: Diagnosis not present

## 2019-12-12 DIAGNOSIS — Z85850 Personal history of malignant neoplasm of thyroid: Secondary | ICD-10-CM

## 2019-12-12 LAB — VITAMIN D 25 HYDROXY (VIT D DEFICIENCY, FRACTURES): VITD: 64.08 ng/mL (ref 30.00–100.00)

## 2019-12-12 LAB — TSH: TSH: 10.69 u[IU]/mL — ABNORMAL HIGH (ref 0.35–4.50)

## 2019-12-12 MED ORDER — LEVOTHYROXINE SODIUM 100 MCG PO TABS: 100.0000 ug | ORAL_TABLET | Freq: Every day | ORAL | 3 refills | Status: DC

## 2019-12-12 NOTE — Progress Notes (Signed)
Subjective:    Patient ID: Sarah Moran, female    DOB: 24-Mar-1981, 39 y.o.   MRN: [id number]  HPI The state of at least three ongoing medical problems is addressed today, with interval history of each noted here:  Stage 1 PTC: 2006: CT: right thyroid mass 2006: right lobect, path unknown 1/07: completion left thyroidect.  Path showed 2 foci PTC, with max diameter 2 mm.   2/07: RAI rx, 100 mCi 3/19: TG undetectable (Ab neg).  3/20: TG undetectable (Ab neg).  Denies neck swelling.  Vit-D def: She takes non-rx vit-D, 2000 units/day.  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   Past Medical History:  Diagnosis Date  . Allergy   . Anal fissure   . Anxiety    no meds  . Chronic pelvic pain in female   . Family history of breast cancer    My Risk neg 5/18  . Family history of cervical cancer   . Family history of skin cancer   . Family history of throat cancer   . Frequent headaches   . Gallstone   . Gallstones   . Genetic testing of female 01/2017   My Risk/BRCA neg  . GERD (gastroesophageal reflux disease)    well controlled. Takes gingerroot when symptoms  . Hypothyroidism    s/p thyroidectomy - levothyroxine 75 mcg daily  . IBS (irritable bowel syndrome)    constipation - takes miralax daily  . Increased risk of breast cancer 01/2017   IBIS=18.8%/riskscore=21.9%  . Liver cyst 04/2018  . Migraine    takes excedrin. She has tried imitrex in the past   . Personal history of radiation therapy   . Seizure ([place]) 1997    x 1-age 15-after surgery for scoliosis  . Thyroid cancer ([place]) 2007   s/p thyroidectomy    Past Surgical History:  Procedure Laterality Date  . ANAL RECTAL MANOMETRY N/A 10/12/2018   Procedure: ANO RECTAL MANOMETRY;  Surgeon: Mauri Pole, MD;  Location: [facility];  Service: Endoscopy;  Laterality: N/A;  . ANTERIOR AND POSTERIOR REPAIR WITH SACROSPINOUS FIXATION N/A 08/02/2018   Procedure: ANTERIOR AND POSTERIOR REPAIR;   Surgeon: Homero Fellers, MD;  Location: [facility];  Service: Gynecology;  Laterality: N/A;  . BACK SURGERY  1997   scoliosis  . CHOLECYSTECTOMY N/A 09/21/2018   Procedure: LAPAROSCOPIC CHOLECYSTECTOMY;  Surgeon: Fredirick Maudlin, MD;  Location: [facility];  Service: General;  Laterality: N/A;  . COLONOSCOPY    . ESOPHAGOGASTRODUODENOSCOPY (EGD) WITH PROPOFOL N/A 06/12/2018   Procedure: ESOPHAGOGASTRODUODENOSCOPY (EGD) WITH PROPOFOL;  Surgeon: Lin Landsman, MD;  Location: [facility];  Service: Gastroenterology;  Laterality: N/A;  . HERNIA REPAIR  1990   bilateral inguinal  . PUBOVAGINAL SLING N/A 08/02/2018   Procedure: PUBO-VAGINAL SLING- TVT;  Surgeon: Homero Fellers, MD;  Location: [facility];  Service: Gynecology;  Laterality: N/A;  . THYROIDECTOMY  2007   thyroid cancer  . TOTAL ABDOMINAL HYSTERECTOMY  2008   dymenorrhea    Social History   Socioeconomic History  . Marital status: Married    Spouse name: Sarah Moran  . Number of children: 2  . Years of education: 34  . Highest education level: Not on file  Occupational History  . Occupation: Stay at home mom  Tobacco Use  . Smoking status: Never Smoker  . Smokeless tobacco: Never Used  Substance and Sexual Activity  . Alcohol use: Yes    Comment: Occasionally - very rarely  . Drug  use: No  . Sexual activity: Yes    Birth control/protection: Surgical    Comment: Hysterectomy  Other Topics Concern  . Not on file  Social History Narrative   Sarah Moran grew up in [place]. She currently lives in [place] with her husband, Sarah Moran, and their 2 sons ([place]  and [place] ). 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 [name] belongs to the [employer]. She enjoys outdoor activities on her spare time.   Social Determinants of Health   Financial Resource Strain:   . Difficulty of Paying Living Expenses:   Food Insecurity:   . Worried About Charity fundraiser in  the Last Year:   . Arboriculturist in the Last Year:   Transportation Needs:   . Film/video editor (Medical):   Marland Kitchen Lack of Transportation (Non-Medical):   Physical Activity:   . Days of Exercise per Week:   . Minutes of Exercise per Session:   Stress:   . Feeling of Stress :   Social Connections:   . Frequency of Communication with Friends and Family:   . Frequency of Social Gatherings with Friends and Family:   . Attends Religious Services:   . Active Member of Clubs or Organizations:   . Attends Archivist Meetings:   Marland Kitchen Marital Status:   Intimate Partner Violence:   . Fear of Current or Ex-Partner:   . Emotionally Abused:   Marland Kitchen Physically Abused:   . Sexually Abused:     Current Outpatient Medications on File Prior to Visit  Medication Sig Dispense Refill  . Cholecalciferol (VITAMIN D3) 50 MCG (2000 UT) capsule Take 2,000 Units by mouth daily.    Marland Kitchen Cod Liver Oil CAPS Take 1 capsule by mouth at bedtime.     . diclofenac Sodium (VOLTAREN) 1 % GEL Apply 2 g topically 4 (four) times daily.    . fluticasone (FLONASE) 50 MCG/ACT nasal spray Place 2 sprays into both nostrils daily as needed.     Marland Kitchen LINZESS 290 MCG CAPS capsule     . montelukast (SINGULAIR) 10 MG tablet TAKE 1 TABLET BY MOUTH EVERYDAY AT BEDTIME 90 tablet 1  . Multiple Vitamin (MULTI-VITAMIN) tablet Take by mouth.    . SUMAtriptan (IMITREX) 50 MG tablet TAKE ONE TABLET BY MOUTH AT ONSET OF MIGRAINE - MAY REPEAT DOSE IN 2 HOURS IF HEADACHE PERSISTS OR RECURS 7 tablet 0   No current facility-administered medications on file prior to visit.    Allergies  Allergen Reactions  . Darvon [Propoxyphene] Other (See Comments)    Hallucinations   . Onion Other (See Comments)    RAW-MIGRAINES  . Percocet [Oxycodone-Acetaminophen] Hives  . Vicodin [Hydrocodone-Acetaminophen] Hives    Family History  Problem Relation Age of Onset  . Hyperlipidemia Father   . Hypertension Father   . Clotting disorder Sister    . Hepatitis B Brother   . Breast cancer Paternal Grandmother 34       with mets  . Cervical cancer Mother        dx. in her early 27s  . Breast cancer Paternal Aunt 21  . Throat cancer Paternal Aunt 65  . Breast cancer Paternal Aunt 62  . Breast cancer Paternal Aunt        59  . Skin cancer Paternal Aunt   . Cancer Cousin        unknown type dx. in her late 20s/early  30s  . Skin cancer Cousin        13 cancerous moles removed in her 40s    BP 120/72 (BP Location: Left Arm, Patient Position: Sitting, Cuff Size: Normal)   Pulse 69   Wt 139 lb 3.2 oz (63.1 kg)   SpO2 99%   BMI 27.19 kg/m     Review of Systems Denies muscle cramps    Objective:   Physical Exam VITAL SIGNS:  See vs page GENERAL: no distress Neck: a healed scar is present.  I do not appreciate a nodule in the thyroid or elsewhere in the neck   Lab Results  Component Value Date   TSH 10.69 (H) 12/12/2019   T4TOTAL 12.7 (H) 01/22/2014      Assessment & Plan:  Hypothyroidism: she needs increased rx.  I have sent a prescription to your pharmacy Vit-D def: recheck today PTC: low risk of recurrence, due to long disease-free interval  Patient Instructions  Blood tests are requested for you today.  We'll let you know about the results.  Please come back for a follow-up appointment in 2 years.

## 2019-12-12 NOTE — Patient Instructions (Addendum)
Blood tests are requested for you today.  We'll let you know about the results.  Please come back for a follow-up appointment in 2 years.

## 2019-12-13 LAB — THYROGLOBULIN ANTIBODY: Thyroglobulin Ab: <1 IU/mL (ref <=1)

## 2019-12-13 LAB — PTH, INTACT AND CALCIUM
Calcium: 9.1 mg/dL (ref 8.6–10.2)
PTH: 28 pg/mL (ref 14–64)

## 2019-12-13 LAB — THYROGLOBULIN LEVEL: Thyroglobulin: <0.1 ng/mL — ABNORMAL LOW

## 2019-12-18 ENCOUNTER — Ambulatory Visit: Payer: BC Managed Care – PPO | Admitting: Family Medicine

## 2019-12-23 ENCOUNTER — Ambulatory Visit: Payer: Self-pay | Attending: Internal Medicine

## 2019-12-23 DIAGNOSIS — Z23 Encounter for immunization: Secondary | ICD-10-CM

## 2019-12-23 NOTE — Progress Notes (Signed)
   Covid-19 Vaccination Clinic  Name:  Sarah Moran    MRN: [id number] DOB: Oct 29, 1980  12/23/2019  [name] was observed post Covid-19 immunization for 15 minutes without incident. She was provided with Vaccine Information Sheet and instruction to access the V-Safe system.   [name] was instructed to call 911 with any severe reactions post vaccine: Marland Kitchen Difficulty breathing  . Swelling of face and throat  . A fast heartbeat  . A bad rash all over body  . Dizziness and weakness   Immunizations Administered    Name Date Dose VIS Date Route   Pfizer COVID-19 Vaccine 12/23/2019  2:02 PM 0.3 mL 08/30/2019 Intramuscular   Manufacturer: [place]   Lot: [id number]   [place]: [id number]

## 2020-01-02 ENCOUNTER — Other Ambulatory Visit: Payer: Self-pay | Admitting: Family Medicine

## 2020-01-15 ENCOUNTER — Ambulatory Visit: Payer: BC Managed Care – PPO | Attending: Internal Medicine

## 2020-01-15 DIAGNOSIS — Z23 Encounter for immunization: Secondary | ICD-10-CM

## 2020-01-15 NOTE — Progress Notes (Signed)
   Covid-19 Vaccination Clinic  Name:  ALEYSHKA CIFUENTES    MRN: [id number] DOB: 11/21/1980  01/15/2020  Ms. Dufresne was observed post Covid-19 immunization for 15 minutes without incident. She was provided with Vaccine Information Sheet and instruction to access the V-Safe system.   Ms. Martinell was instructed to call 911 with any severe reactions post vaccine: Marland Kitchen Difficulty breathing  . Swelling of face and throat  . A fast heartbeat  . A bad rash all over body  . Dizziness and weakness   Immunizations Administered    Name Date Dose VIS Date Route   Pfizer COVID-19 Vaccine 01/15/2020 12:55 PM 0.3 mL 11/13/2018 Intramuscular   Manufacturer: [place]   Lot: [id number]   [place]: [id number]

## 2020-01-17 ENCOUNTER — Other Ambulatory Visit: Payer: Self-pay

## 2020-01-17 ENCOUNTER — Other Ambulatory Visit: Payer: Self-pay | Admitting: Endocrinology

## 2020-01-17 ENCOUNTER — Other Ambulatory Visit (INDEPENDENT_AMBULATORY_CARE_PROVIDER_SITE_OTHER): Payer: BC Managed Care – PPO

## 2020-01-17 DIAGNOSIS — E039 Hypothyroidism, unspecified: Secondary | ICD-10-CM | POA: Diagnosis not present

## 2020-01-17 LAB — TSH: TSH: 5.22 u[IU]/mL — ABNORMAL HIGH (ref 0.35–4.50)

## 2020-01-17 MED ORDER — LEVOTHYROXINE SODIUM 125 MCG PO TABS: 125.0000 ug | ORAL_TABLET | Freq: Every day | ORAL | 3 refills | Status: DC

## 2020-01-26 ENCOUNTER — Other Ambulatory Visit: Payer: Self-pay | Admitting: Gastroenterology

## 2020-01-28 ENCOUNTER — Ambulatory Visit (INDEPENDENT_AMBULATORY_CARE_PROVIDER_SITE_OTHER): Payer: BC Managed Care – PPO

## 2020-01-28 ENCOUNTER — Encounter: Payer: Self-pay | Admitting: Family Medicine

## 2020-01-28 ENCOUNTER — Ambulatory Visit: Payer: BC Managed Care – PPO | Admitting: Family Medicine

## 2020-01-28 ENCOUNTER — Other Ambulatory Visit: Payer: Self-pay

## 2020-01-28 DIAGNOSIS — G43909 Migraine, unspecified, not intractable, without status migrainosus: Secondary | ICD-10-CM

## 2020-01-28 DIAGNOSIS — K5904 Chronic idiopathic constipation: Secondary | ICD-10-CM | POA: Diagnosis not present

## 2020-01-28 DIAGNOSIS — J309 Allergic rhinitis, unspecified: Secondary | ICD-10-CM | POA: Diagnosis not present

## 2020-01-28 DIAGNOSIS — M2550 Pain in unspecified joint: Secondary | ICD-10-CM

## 2020-01-28 DIAGNOSIS — M25522 Pain in left elbow: Secondary | ICD-10-CM | POA: Diagnosis not present

## 2020-01-28 DIAGNOSIS — E039 Hypothyroidism, unspecified: Secondary | ICD-10-CM | POA: Diagnosis not present

## 2020-01-28 IMAGING — DX DG ELBOW COMPLETE 3+V*L*
4 series · 4 of 4 positions shown · non-contrast
Comparison: None

CLINICAL DATA: LEFT elbow pain for several months

EXAM:
LEFT ELBOW - COMPLETE 3+ VIEW

[elbow ap]
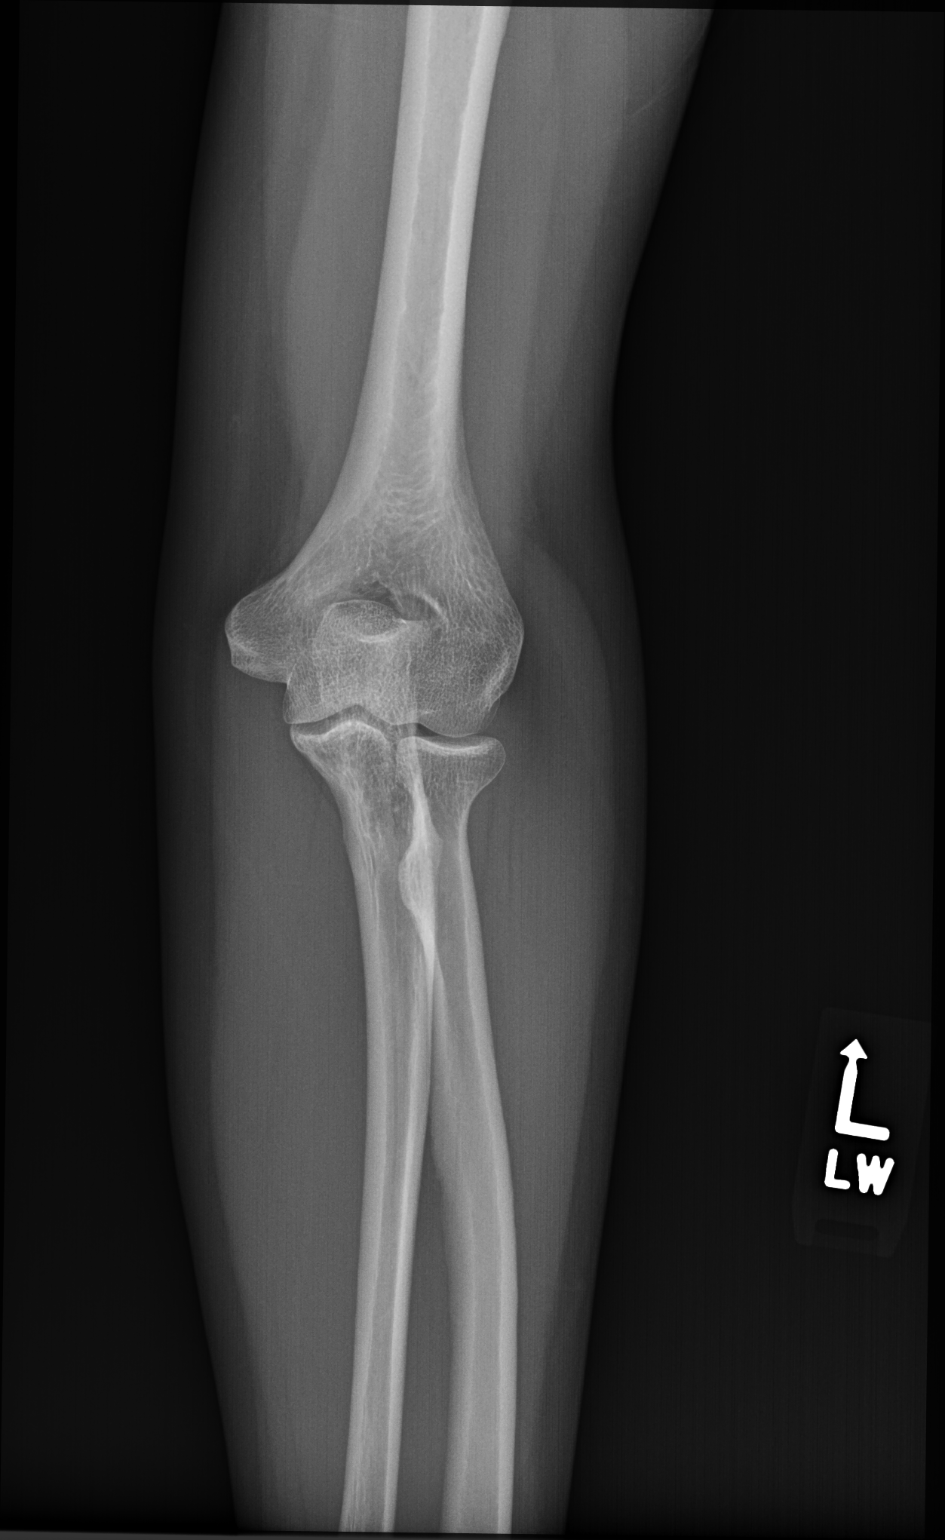

[elbow obl (oblique) (1 of 2)]
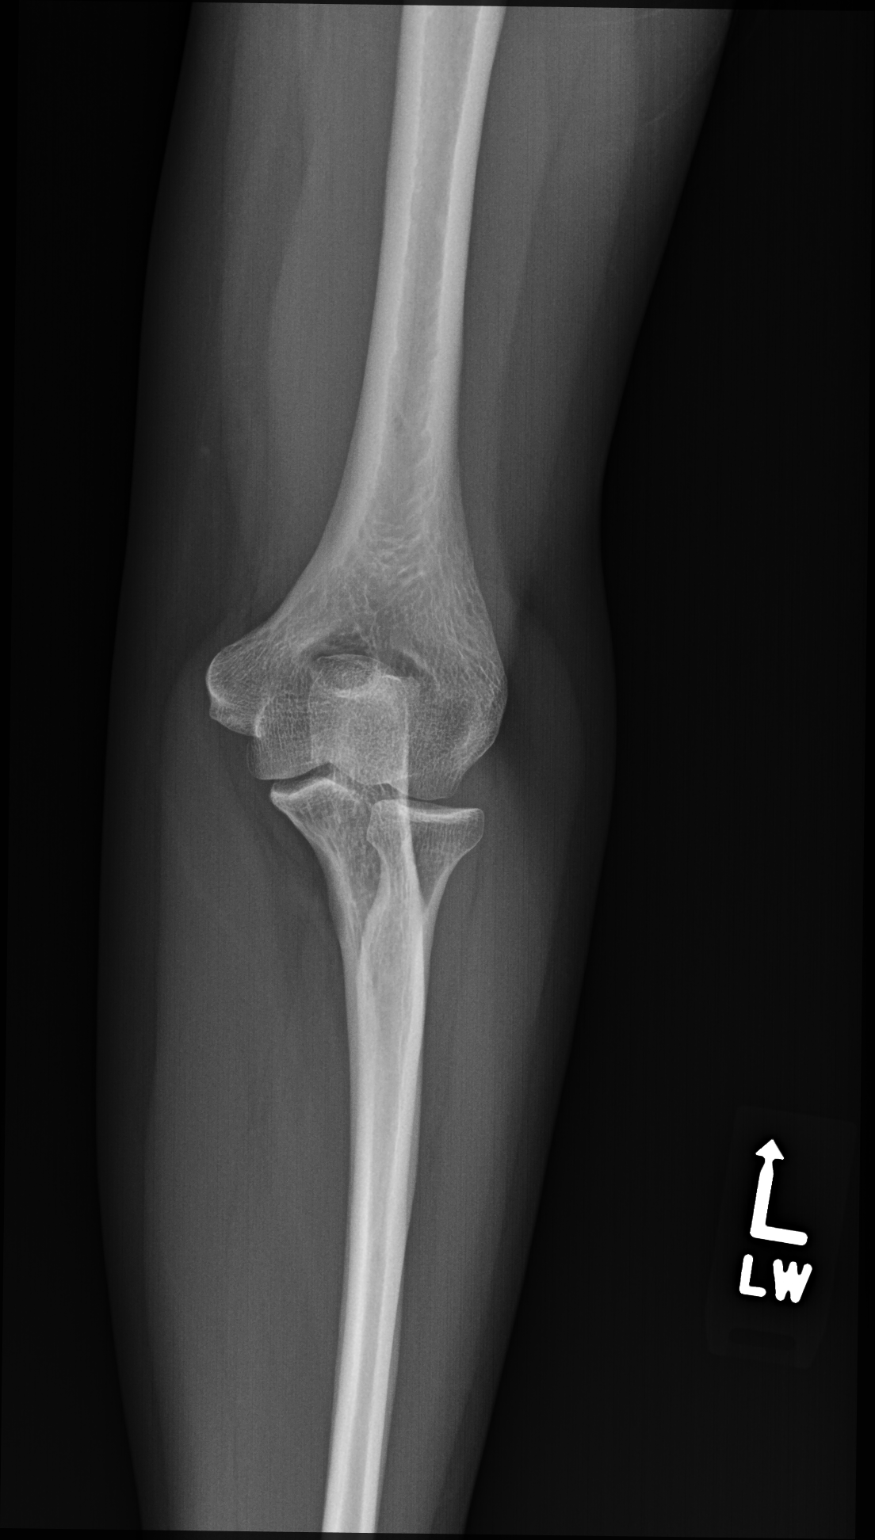

[elbow obl (oblique) (2 of 2)]
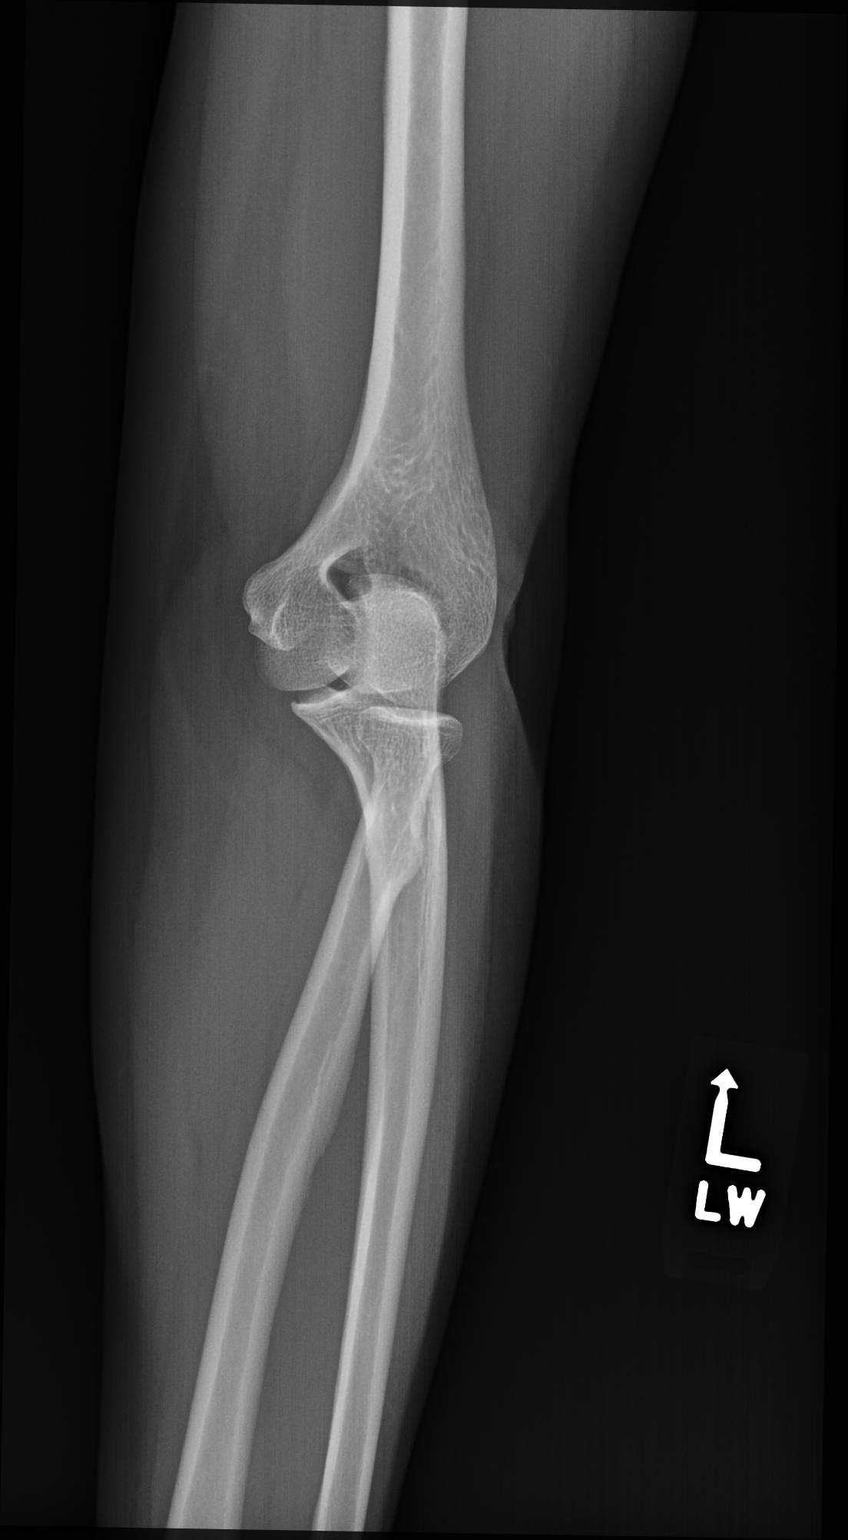

[elbow lat]
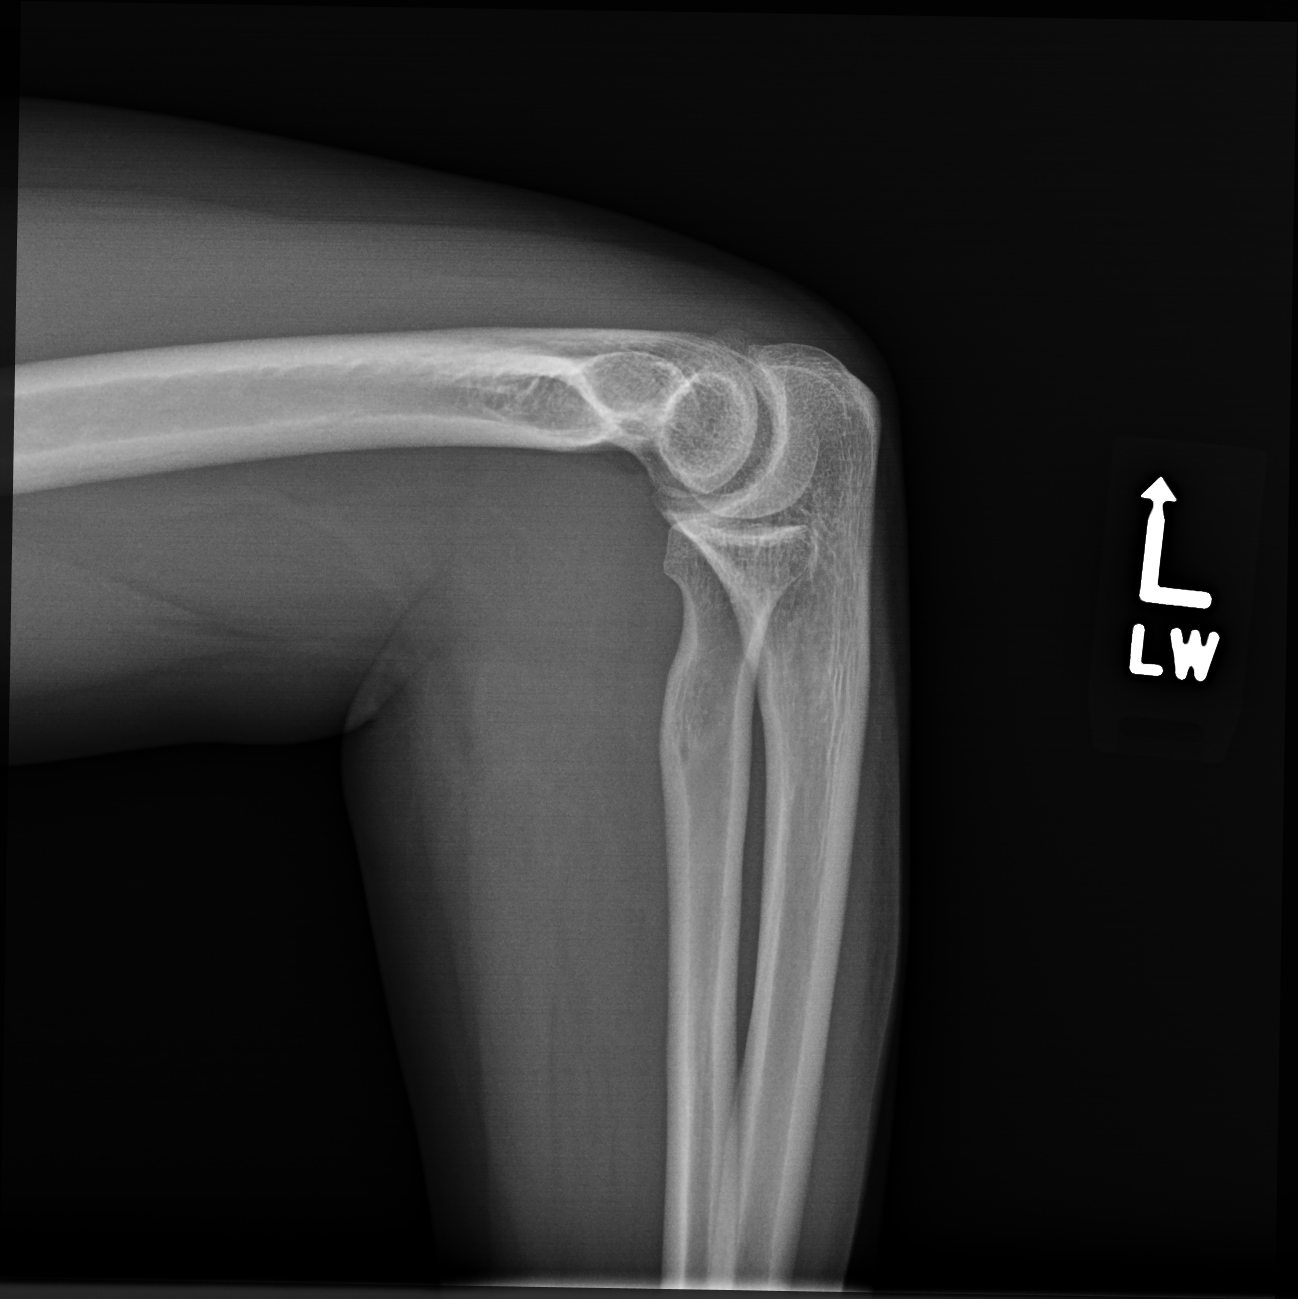

[4 of 4 positions shown; findings below may reference images not displayed]

FINDINGS: Osseous mineralization normal.

Joint spaces preserved.

No fracture, dislocation, or bone destruction.

No joint effusion.
IMPRESSION: Normal exam.

## 2020-01-28 NOTE — Progress Notes (Signed)
Tommi Rumps, MD Phone: [phone]  Sarah Moran is a 39 y.o. female who presents today for f/u.  HYPOTHYROIDISM Disease Monitoring Weight changes: no  Skin Changes: some dry skin though improving Heat/Cold intolerance: no       Fatigue: was having this though improved with increase dose of synthroid  Medication Monitoring Compliance:  Taking synthroid   Last TSH:   Lab Results  Component Value Date   TSH 5.22 (H) 01/17/2020   Left elbow pain/arthralgias: Patient notes she saw rheumatology.  They prescribed diclofenac gel for her left elbow.  No further work-up was completed for her arthralgias.  She notes her left elbow is better though she still has discomfort particularly with extending it.  It does pop on motion.  Allergic rhinitis: Flonase and Singulair worked well for her.  If she forgets the Flonase she has issues with congestion.  Chronic constipation: Patient takes Linzess and MiraLAX chronically.  She does have loose stools relating to this.  She notes no right upper quadrant pain but does note some right mid abdominal discomfort at times.  No blood in her stool.  Pain typically occurs when eating certain foods.  Does have liver cyst that appears stable on recent MRI of her breasts.  Social History   Tobacco Use  Smoking Status Never Smoker  Smokeless Tobacco Never Used     ROS see history of present illness  Objective  Physical Exam Vitals:   01/28/20 0932  BP: 110/60  Pulse: 87  Temp: 97.7 F (36.5 C)  SpO2: 98%    BP Readings from Last 3 Encounters:  01/28/20 110/60  12/12/19 120/72  11/25/19 102/60   Wt Readings from Last 3 Encounters:  01/28/20 138 lb 3.2 oz (62.7 kg)  12/12/19 139 lb 3.2 oz (63.1 kg)  11/25/19 140 lb (63.5 kg)    Physical Exam Constitutional:      General: She is not in acute distress.    Appearance: She is not diaphoretic.  Cardiovascular:     Rate and Rhythm: Normal rate and regular rhythm.     Heart sounds:  Normal heart sounds.  Pulmonary:     Effort: Pulmonary effort is normal.     Breath sounds: Normal breath sounds.  Abdominal:     General: Bowel sounds are normal. There is no distension.     Palpations: Abdomen is soft.     Tenderness: There is no abdominal tenderness. There is no guarding or rebound.  Skin:    General: Skin is warm and dry.  Neurological:     Mental Status: She is alert.     Comments: Moves all extremities equally, rises from chair with no difficulty      Assessment/Plan: Please see individual problem list.  Allergic rhinitis Continue current regimen.  Constipation Chronic issue.  Does have occasional right mid abdominal discomfort when she eats certain foods.  Suspect this is food related given prior reassuring imaging.  She will monitor.  If it worsens she will let us know.  Arthralgia Check x-ray of left elbow.  Continue as needed diclofenac gel.  Hypothyroidism Continue Synthroid.  She will remain off of biotin.  We will have her return for labs in about a month.  Migraine Patient does report that she had a week or so where her migraines were worse and she had to take Imitrex most days.  Notes that has improved significantly with better control of her allergies and increasing the dose of her thyroid medicine.  She will monitor for any worsening and she will contact us if she ever has increased frequency like that again.  Orders Placed This Encounter  Procedures  . DG Elbow Complete Left    Standing Status:   Future    Number of Occurrences:   1    Standing Expiration Date:   03/29/2021    Order Specific Question:   Reason for Exam (SYMPTOM  OR DIAGNOSIS REQUIRED)    Answer:   left elbow pain, several months    Order Specific Question:   Is patient pregnant?    Answer:   No    Order Specific Question:   Preferred imaging location?    Answer:   [facility] Specific Question:   Radiology Contrast Protocol - do NOT remove file  path    Answer:   [email or web address]    No orders of the defined types were placed in this encounter.   This visit occurred during the SARS-CoV-2 public health emergency.  Safety protocols were in place, including screening questions prior to the visit, additional usage of staff PPE, and extensive cleaning of exam room while observing appropriate contact time as indicated for disinfecting solutions.    Tommi Rumps, MD [place]

## 2020-01-28 NOTE — Assessment & Plan Note (Signed)
Continue current regimen

## 2020-01-28 NOTE — Assessment & Plan Note (Signed)
Chronic issue.  Does have occasional right mid abdominal discomfort when she eats certain foods.  Suspect this is food related given prior reassuring imaging.  She will monitor.  If it worsens she will let us know.

## 2020-01-28 NOTE — Patient Instructions (Signed)
Nice to see you. We will get an x-ray of your left elbow.

## 2020-01-28 NOTE — Assessment & Plan Note (Signed)
Continue Synthroid.  She will remain off of biotin.  We will have her return for labs in about a month.

## 2020-01-28 NOTE — Assessment & Plan Note (Signed)
Patient does report that she had a week or so where her migraines were worse and she had to take Imitrex most days.  Notes that has improved significantly with better control of her allergies and increasing the dose of her thyroid medicine.  She will monitor for any worsening and she will contact us if she ever has increased frequency like that again.

## 2020-01-28 NOTE — Assessment & Plan Note (Signed)
Check x-ray of left elbow.  Continue as needed diclofenac gel.

## 2020-02-18 ENCOUNTER — Other Ambulatory Visit: Payer: Self-pay | Admitting: Endocrinology

## 2020-02-18 ENCOUNTER — Encounter: Payer: Self-pay | Admitting: Endocrinology

## 2020-02-18 ENCOUNTER — Other Ambulatory Visit (INDEPENDENT_AMBULATORY_CARE_PROVIDER_SITE_OTHER): Payer: BC Managed Care – PPO

## 2020-02-18 ENCOUNTER — Other Ambulatory Visit: Payer: Self-pay

## 2020-02-18 DIAGNOSIS — E039 Hypothyroidism, unspecified: Secondary | ICD-10-CM

## 2020-02-18 LAB — TSH: TSH: 0.09 u[IU]/mL — ABNORMAL LOW (ref 0.35–4.50)

## 2020-02-18 LAB — T4, FREE: Free T4: 1.82 ng/dL — ABNORMAL HIGH (ref 0.60–1.60)

## 2020-02-18 MED ORDER — LEVOTHYROXINE SODIUM 112 MCG PO TABS: 112.0000 ug | ORAL_TABLET | Freq: Every day | ORAL | 3 refills | Status: DC

## 2020-02-23 ENCOUNTER — Encounter: Payer: Self-pay | Admitting: Endocrinology

## 2020-02-25 ENCOUNTER — Other Ambulatory Visit: Payer: Self-pay | Admitting: Family Medicine

## 2020-03-19 DIAGNOSIS — D2272 Melanocytic nevi of left lower limb, including hip: Secondary | ICD-10-CM | POA: Diagnosis not present

## 2020-03-20 DIAGNOSIS — D485 Neoplasm of uncertain behavior of skin: Secondary | ICD-10-CM | POA: Diagnosis not present

## 2020-03-20 DIAGNOSIS — D225 Melanocytic nevi of trunk: Secondary | ICD-10-CM | POA: Diagnosis not present

## 2020-03-20 DIAGNOSIS — D2261 Melanocytic nevi of right upper limb, including shoulder: Secondary | ICD-10-CM | POA: Diagnosis not present

## 2020-03-20 DIAGNOSIS — D2262 Melanocytic nevi of left upper limb, including shoulder: Secondary | ICD-10-CM | POA: Diagnosis not present

## 2020-03-20 DIAGNOSIS — D2272 Melanocytic nevi of left lower limb, including hip: Secondary | ICD-10-CM | POA: Diagnosis not present

## 2020-03-25 ENCOUNTER — Other Ambulatory Visit (INDEPENDENT_AMBULATORY_CARE_PROVIDER_SITE_OTHER): Payer: BC Managed Care – PPO

## 2020-03-25 ENCOUNTER — Encounter: Payer: Self-pay | Admitting: Endocrinology

## 2020-03-25 ENCOUNTER — Other Ambulatory Visit: Payer: Self-pay

## 2020-03-25 ENCOUNTER — Other Ambulatory Visit: Payer: Self-pay | Admitting: Endocrinology

## 2020-03-25 DIAGNOSIS — E039 Hypothyroidism, unspecified: Secondary | ICD-10-CM

## 2020-03-25 LAB — T4, FREE: Free T4: 1.72 ng/dL — ABNORMAL HIGH (ref 0.60–1.60)

## 2020-03-25 LAB — TSH: TSH: 0.05 u[IU]/mL — ABNORMAL LOW (ref 0.35–4.50)

## 2020-03-25 MED ORDER — SYNTHROID 88 MCG PO TABS: 88.0000 ug | ORAL_TABLET | Freq: Every day | ORAL | 0 refills | Status: DC

## 2020-04-03 ENCOUNTER — Other Ambulatory Visit: Payer: Self-pay | Admitting: Family

## 2020-05-13 ENCOUNTER — Other Ambulatory Visit: Payer: Self-pay

## 2020-05-13 ENCOUNTER — Other Ambulatory Visit (INDEPENDENT_AMBULATORY_CARE_PROVIDER_SITE_OTHER): Payer: BC Managed Care – PPO

## 2020-05-13 DIAGNOSIS — E039 Hypothyroidism, unspecified: Secondary | ICD-10-CM | POA: Diagnosis not present

## 2020-05-14 LAB — T4, FREE: Free T4: 1.32 ng/dL (ref 0.60–1.60)

## 2020-05-14 LAB — TSH: TSH: 0.5 u[IU]/mL (ref 0.35–4.50)

## 2020-05-19 ENCOUNTER — Emergency Department (HOSPITAL_COMMUNITY): Payer: BC Managed Care – PPO

## 2020-05-19 ENCOUNTER — Emergency Department (HOSPITAL_COMMUNITY)
Admission: EM | Admit: 2020-05-19 | Discharge: 2020-05-19 | Disposition: A | Discharge status: Home / Self Care | Payer: BC Managed Care – PPO | Attending: Emergency Medicine | Admitting: Emergency Medicine

## 2020-05-19 ENCOUNTER — Other Ambulatory Visit: Payer: Self-pay

## 2020-05-19 DIAGNOSIS — K219 Gastro-esophageal reflux disease without esophagitis: Secondary | ICD-10-CM | POA: Diagnosis not present

## 2020-05-19 DIAGNOSIS — F419 Anxiety disorder, unspecified: Secondary | ICD-10-CM | POA: Diagnosis not present

## 2020-05-19 DIAGNOSIS — R682 Dry mouth, unspecified: Secondary | ICD-10-CM | POA: Diagnosis not present

## 2020-05-19 DIAGNOSIS — R42 Dizziness and giddiness: Secondary | ICD-10-CM | POA: Diagnosis not present

## 2020-05-19 DIAGNOSIS — R140 Abdominal distension (gaseous): Secondary | ICD-10-CM | POA: Diagnosis not present

## 2020-05-19 DIAGNOSIS — E039 Hypothyroidism, unspecified: Secondary | ICD-10-CM | POA: Diagnosis not present

## 2020-05-19 DIAGNOSIS — Z79899 Other long term (current) drug therapy: Secondary | ICD-10-CM | POA: Insufficient documentation

## 2020-05-19 DIAGNOSIS — R55 Syncope and collapse: Secondary | ICD-10-CM | POA: Diagnosis not present

## 2020-05-19 DIAGNOSIS — Z9049 Acquired absence of other specified parts of digestive tract: Secondary | ICD-10-CM | POA: Diagnosis not present

## 2020-05-19 DIAGNOSIS — R110 Nausea: Secondary | ICD-10-CM | POA: Insufficient documentation

## 2020-05-19 DIAGNOSIS — N83291 Other ovarian cyst, right side: Secondary | ICD-10-CM | POA: Diagnosis not present

## 2020-05-19 DIAGNOSIS — Z85850 Personal history of malignant neoplasm of thyroid: Secondary | ICD-10-CM | POA: Insufficient documentation

## 2020-05-19 DIAGNOSIS — R1011 Right upper quadrant pain: Secondary | ICD-10-CM | POA: Diagnosis not present

## 2020-05-19 LAB — URINALYSIS, ROUTINE W REFLEX MICROSCOPIC
Bilirubin Urine: NEGATIVE
Glucose, UA: NEGATIVE mg/dL
Ketones, ur: NEGATIVE mg/dL
Leukocytes,Ua: NEGATIVE
Nitrite: NEGATIVE
Protein, ur: NEGATIVE mg/dL
Specific Gravity, Urine: 1.001 — ABNORMAL LOW (ref 1.005–1.030)
pH: 7 (ref 5.0–8.0)

## 2020-05-19 LAB — CBC WITH DIFFERENTIAL/PLATELET
Abs Immature Granulocytes: 0.03 10*3/uL (ref 0.00–0.07)
Basophils Absolute: 0 10*3/uL (ref 0.0–0.1)
Basophils Relative: 0 %
Eosinophils Absolute: 0 10*3/uL (ref 0.0–0.5)
Eosinophils Relative: 0 %
HCT: 41 % (ref 36.0–46.0)
Hemoglobin: 13.7 g/dL (ref 12.0–15.0)
Immature Granulocytes: 0 %
Lymphocytes Relative: 14 %
Lymphs Abs: 1.3 10*3/uL (ref 0.7–4.0)
MCH: 30.2 pg (ref 26.0–34.0)
MCHC: 33.4 g/dL (ref 30.0–36.0)
MCV: 90.3 fL (ref 80.0–100.0)
Monocytes Absolute: 0.3 10*3/uL (ref 0.1–1.0)
Monocytes Relative: 3 %
Neutro Abs: 7.8 10*3/uL — ABNORMAL HIGH (ref 1.7–7.7)
Neutrophils Relative %: 83 %
Platelets: 347 10*3/uL (ref 150–400)
RBC: 4.54 MIL/uL (ref 3.87–5.11)
RDW: 12.7 % (ref 11.5–15.5)
WBC: 9.4 10*3/uL (ref 4.0–10.5)
nRBC: 0 % (ref 0.0–0.2)

## 2020-05-19 LAB — COMPREHENSIVE METABOLIC PANEL
ALT: 14 U/L (ref 0–44)
AST: 21 U/L (ref 15–41)
Albumin: 4.6 g/dL (ref 3.5–5.0)
Alkaline Phosphatase: 76 U/L (ref 38–126)
Anion gap: 10 (ref 5–15)
BUN: <5 mg/dL — ABNORMAL LOW (ref 6–20)
CO2: 25 mmol/L (ref 22–32)
Calcium: 9.3 mg/dL (ref 8.9–10.3)
Chloride: 103 mmol/L (ref 98–111)
Creatinine, Ser: 0.53 mg/dL (ref 0.44–1.00)
GFR calc Af Amer: >60 mL/min (ref >60)
GFR calc non Af Amer: >60 mL/min (ref >60)
Glucose, Bld: 118 mg/dL — ABNORMAL HIGH (ref 70–99)
Potassium: 3.2 mmol/L — ABNORMAL LOW (ref 3.5–5.1)
Sodium: 138 mmol/L (ref 135–145)
Total Bilirubin: 0.5 mg/dL (ref 0.3–1.2)
Total Protein: 8.3 g/dL — ABNORMAL HIGH (ref 6.5–8.1)

## 2020-05-19 LAB — CBG MONITORING, ED: Glucose-Capillary: 122 mg/dL — ABNORMAL HIGH (ref 70–99)

## 2020-05-19 LAB — LIPASE, BLOOD: Lipase: 22 U/L (ref 11–51)

## 2020-05-19 IMAGING — CT CT ABD-PELV W/ CM
2 of 5 series · 16 of 46 positions shown, 18 images · IV contrast (Omnipaque or Isovue)
Comparison: [DATE]

CLINICAL DATA: Abdominal distension, bloating, right upper quadrant
pain

EXAM:
CT ABDOMEN AND PELVIS WITH CONTRAST
TECHNIQUE: Multidetector CT imaging of the abdomen and pelvis was performed
using the standard protocol following bolus administration of
intravenous contrast.
CONTRAST:  100mL OMNIPAQUE IOHEXOL 300 MG/ML  SOLN

[Series 2: axial st · axial · 0.68mm/px · z∈[+1028,+1403]mm · 13 of 85 slices shown, 15 images]
[im 5/85  soft-tissue]
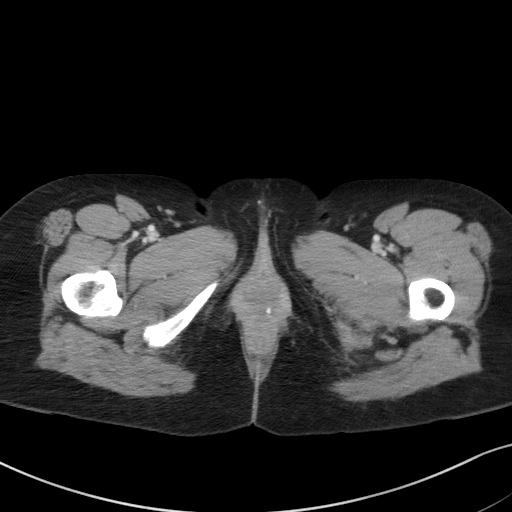
[im 5/85  bone]
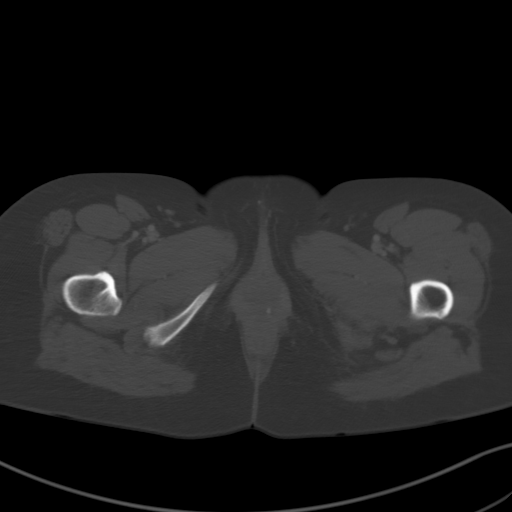
[im 10/85  soft-tissue]
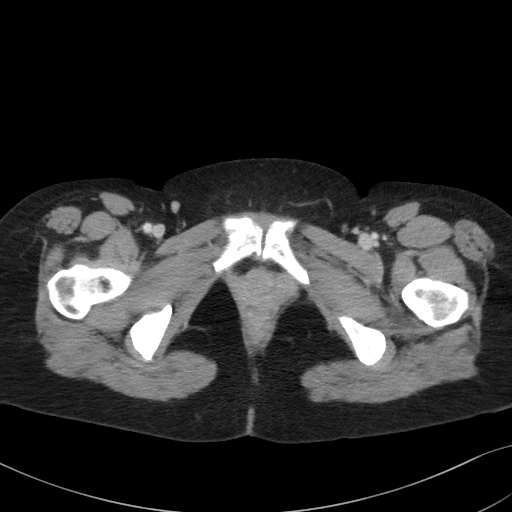
[im 20/85  soft-tissue]
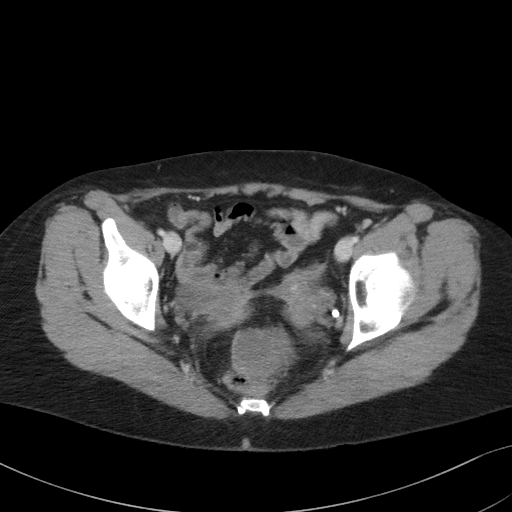
[im 25/85  soft-tissue]
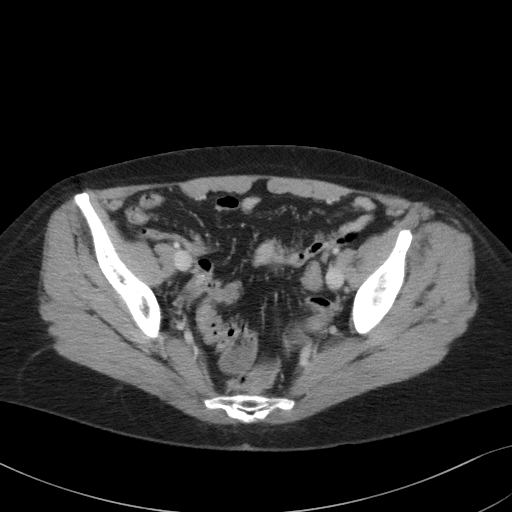
[im 30/85  soft-tissue]
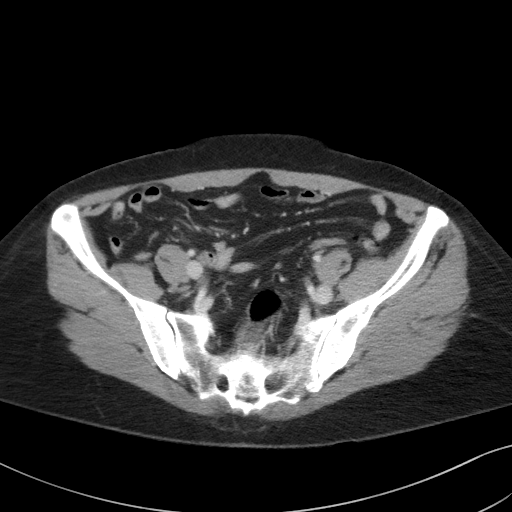
[im 35/85  soft-tissue]
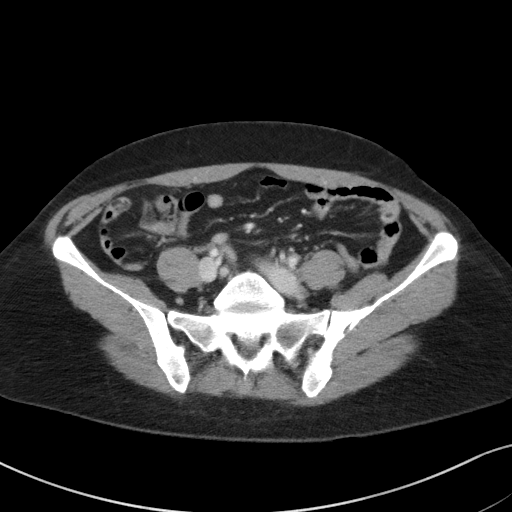
[im 45/85  soft-tissue]
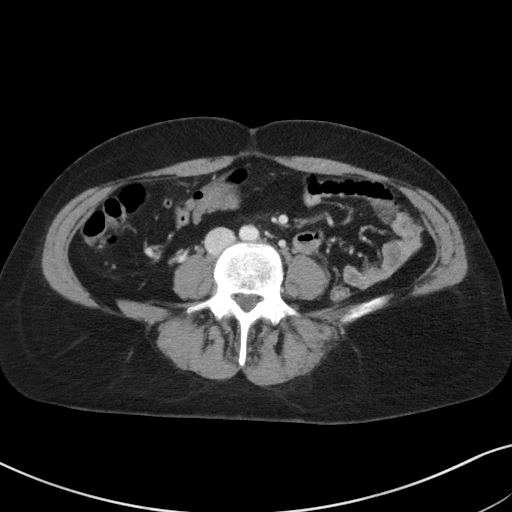
[im 50/85  soft-tissue]
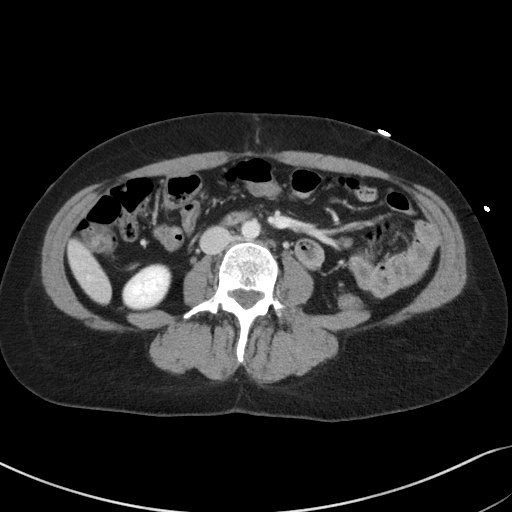
[im 55/85  soft-tissue]
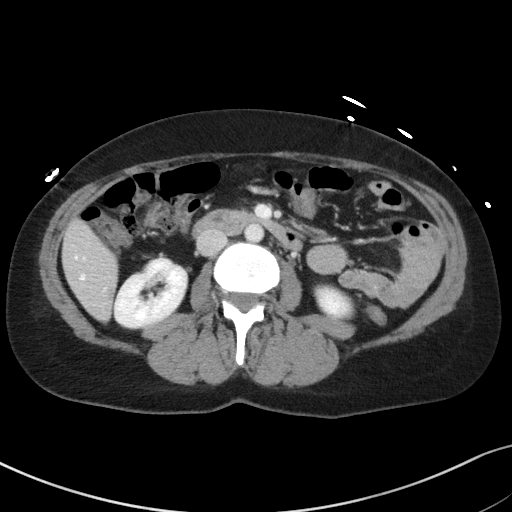
[im 55/85  bone]
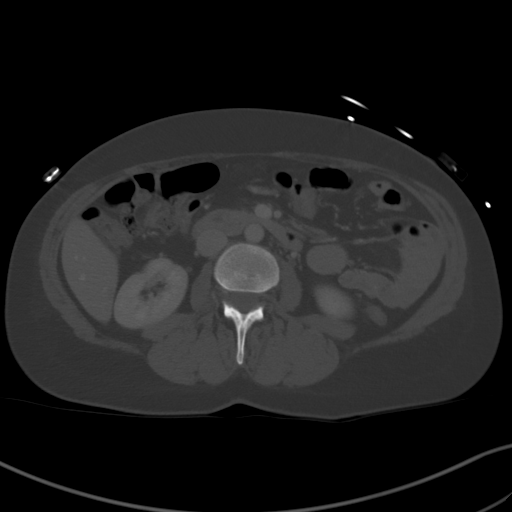
[im 60/85  soft-tissue]
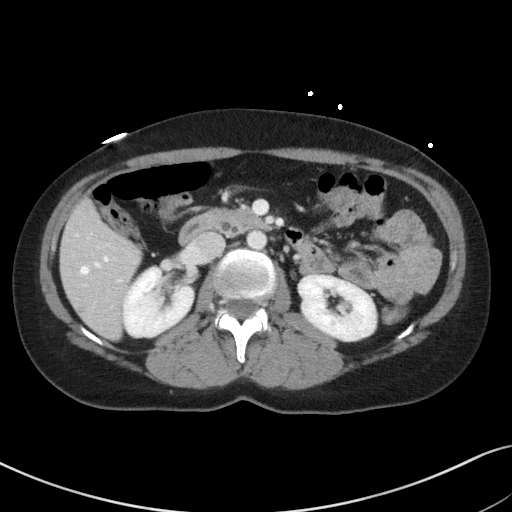
[im 65/85  soft-tissue]
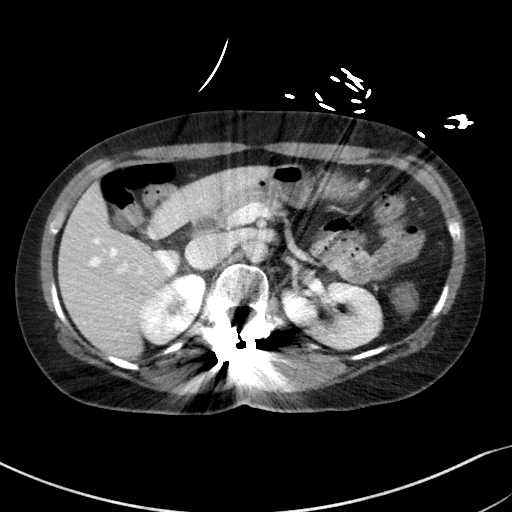
[im 75/85  soft-tissue]
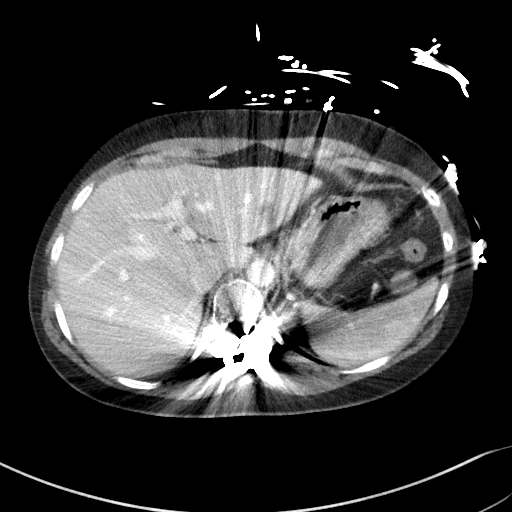
[im 80/85  soft-tissue]
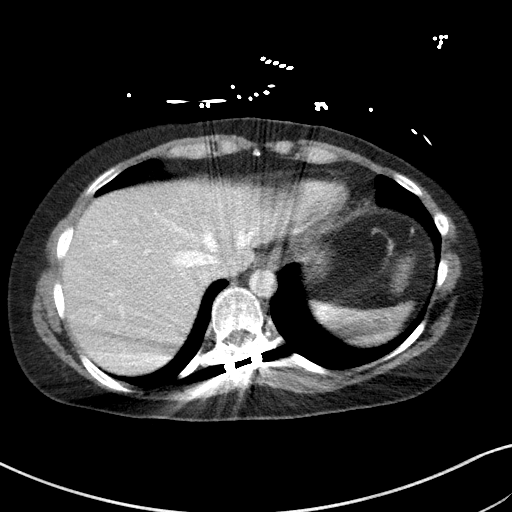

[Series 5: coronal st · coronal · 0.73mm/px · 3 of 71 slices shown]
[im 24/71  soft-tissue]
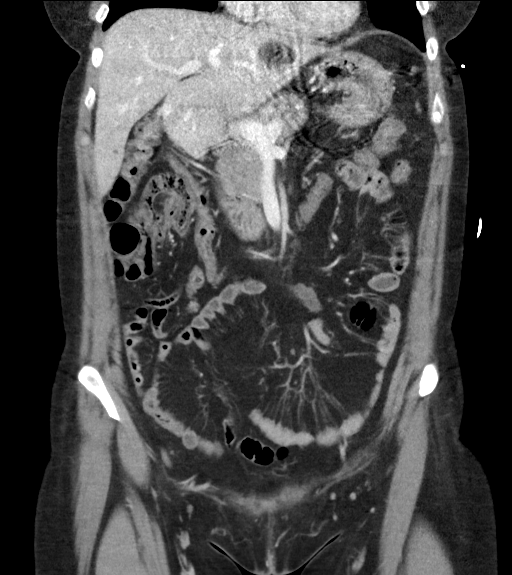
[im 32/71  soft-tissue]
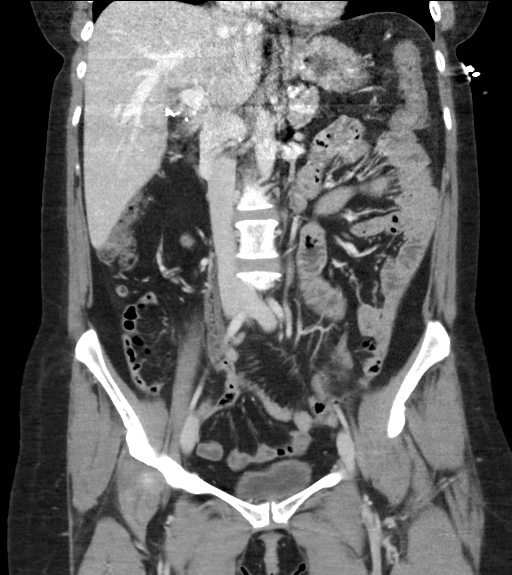
[im 39/71  soft-tissue]
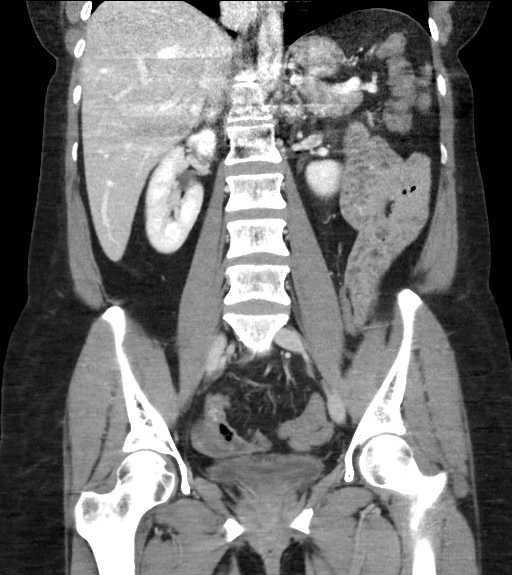

[16 of 46 positions shown; findings below may reference images not displayed]

FINDINGS: Lower chest: No acute abnormality.

Hepatobiliary: No focal liver abnormality is seen. Simple cysts.
Status post cholecystectomy. Postoperative biliary dilatation.

Pancreas: Unremarkable. No pancreatic ductal dilatation or
surrounding inflammatory changes.

Spleen: Normal in size without significant abnormality.

Adrenals/Urinary Tract: Adrenal glands are unremarkable. Kidneys are
normal, without renal calculi, solid lesion, or hydronephrosis.
Bladder is unremarkable.

Stomach/Bowel: Stomach is within normal limits. Appendix appears
normal. No evidence of bowel wall thickening, distention, or
inflammatory changes.

Vascular/Lymphatic: No significant vascular findings are present. No
enlarged abdominal or pelvic lymph nodes.

Reproductive: Status post hysterectomy. Simple cyst of the right
ovary.

Other: No abdominal wall hernia or abnormality. No abdominopelvic
ascites.

Musculoskeletal: No acute or significant osseous findings. Partially
imaged thoracolumbar fusion.
IMPRESSION: 1. No acute CT findings of the abdomen or pelvis to explain
abdominal pain or bloating.
2. Status post cholecystectomy with postoperative biliary ductal
dilatation.

## 2020-05-19 MED ORDER — SODIUM CHLORIDE 0.9 % IV BOLUS
1000.0000 mL | Freq: Once | INTRAVENOUS | Status: AC
  Administered 2020-05-19: 1000 mL via INTRAVENOUS

## 2020-05-19 MED ORDER — ONDANSETRON HCL 4 MG/2ML IJ SOLN
4.0000 mg | Freq: Once | INTRAMUSCULAR | Status: DC
  Filled 2020-05-19: qty 2

## 2020-05-19 MED ORDER — FENTANYL CITRATE (PF) 100 MCG/2ML IJ SOLN
25.0000 ug | Freq: Once | INTRAMUSCULAR | Status: DC
  Filled 2020-05-19: qty 2

## 2020-05-19 MED ORDER — IOHEXOL 300 MG/ML  SOLN
100.0000 mL | Freq: Once | INTRAMUSCULAR | Status: AC | PRN
  Administered 2020-05-19: 100 mL via INTRAVENOUS

## 2020-05-19 NOTE — ED Provider Notes (Signed)
Care assumed from Dr. Tomi Bamberger at shift change.  Patient awaiting results of CT scan to further evaluate episodes of abdominal pain she has been experiencing at home.  Patient is status post cholecystectomy in the past.  Her CT scan today shows no acute process.  Patient's laboratory studies were reviewed showing no white count, normal LFTs and lipase, and clear urine.  At this point, patient was informed of the results of her study and seems appropriate for discharge.  Patient does have a gastroenterologist she will follow up with in the near future to discuss these issues.  She is to return as needed if symptoms worsen or change in the meantime.   Veryl Speak, MD 05/19/20 [phone]

## 2020-05-19 NOTE — ED Triage Notes (Signed)
Pt is pre-diabetic, feels like she is going to pass out, feels like her toung is going to twist up. Has right sided pain, gall bladder removed Jan 2020. Said she did this last week too but it only lasted 10 minutes.

## 2020-05-19 NOTE — ED Provider Notes (Signed)
[facility] EMERGENCY DEPARTMENT Provider Note   CSN: [id number] Arrival date & time: 05/19/20  0200   Time seen 6:10 AM  History Chief Complaint  Patient presents with  . Near Syncope    Sarah Moran is a 39 y.o. female.  HPI   Patient states she got up at 12:50 AM this morning to do laundry for her son who had to work today and she felt lightheaded and felt like she was going to have a migraine start so she took some Imitrex.  She then started having pain around 1:00  in her right upper quadrant.  She states she feels like her abdomen is bloated.  She states the pain was sharp and now it is just "vague".  She felt like she needed to pass gas and she did.  She states she had nausea without vomiting.  She states she had 2 loose bowel movements which is unusual because she has IBS-C and takes Linzess and MiraLAX on a regular basis.  She states her tongue felt weird and her breathing was fast but she denies cramping of her hands or numbness around her mouth.  She felt like she was going to pass out however she was having the pain at the time.  She states she had a similar episode last week that lasted about 10 minutes.  She states that episode was also preceded by headache.  She states her gastroenterologist is in [place] but does not recall the name.  She has had H. pylori in the past and had to have 2 rounds of antibiotics.  She states she feels like her abdomen is chronically bloated.  She states she had an MRI in January and it showed that she had cyst on her liver.  She states she had it done as a mammogram.  PCP Leone Haven, MD   Past Medical History:  Diagnosis Date  . Allergy   . Anal fissure   . Anxiety    no meds  . Chronic pelvic pain in female   . Family history of breast cancer    My Risk neg 5/18  . Family history of cervical cancer   . Family history of skin cancer   . Family history of throat cancer   . Frequent headaches   . Gallstone   . Gallstones    . Genetic testing of female 01/2017   My Risk/BRCA neg  . GERD (gastroesophageal reflux disease)    well controlled. Takes gingerroot when symptoms  . Hypothyroidism    s/p thyroidectomy - levothyroxine 75 mcg daily  . IBS (irritable bowel syndrome)    constipation - takes miralax daily  . Increased risk of breast cancer 01/2017   IBIS=18.8%/riskscore=21.9%  . Liver cyst 04/2018  . Migraine    takes excedrin. She has tried imitrex in the past   . Personal history of radiation therapy   . Seizure ([place]) 1997    x 1-age 14-after surgery for scoliosis  . Thyroid cancer ([place]) 2007   s/p thyroidectomy    Patient Active Problem List   Diagnosis Date Noted  . Family history of skin cancer   . Family history of cervical cancer   . Family history of throat cancer   . Arthralgia 09/30/2019  . Allergic rhinitis 05/06/2019  . Hypocalcemia 12/12/2018  . Constipation   . Calculus of gallbladder without cholecystitis without obstruction   . Breast pain, right 08/16/2018  . Prolapse of female pelvic organs 08/02/2018  .  Rectocele 08/02/2018  . H. pylori infection 06/19/2018  . Dyspepsia   . Muscle ache 04/23/2018  . Paresthesia 04/23/2018  . Anxiety 04/23/2018  . Tension headache 04/23/2018  . Hyperlipidemia 05/02/2017  . Family history of breast cancer 12/15/2016  . Annual physical exam 04/01/2016  . History of thyroid cancer 06/27/2014  . Hypothyroidism 06/27/2014  . Migraine 09/23/2013    Past Surgical History:  Procedure Laterality Date  . ANAL RECTAL MANOMETRY N/A 10/12/2018   Procedure: ANO RECTAL MANOMETRY;  Surgeon: Mauri Pole, MD;  Location: [facility];  Service: Endoscopy;  Laterality: N/A;  . ANTERIOR AND POSTERIOR REPAIR WITH SACROSPINOUS FIXATION N/A 08/02/2018   Procedure: ANTERIOR AND POSTERIOR REPAIR;  Surgeon: Homero Fellers, MD;  Location: [facility];  Service: Gynecology;  Laterality: N/A;  . BACK SURGERY  1997   scoliosis  . CHOLECYSTECTOMY  N/A 09/21/2018   Procedure: LAPAROSCOPIC CHOLECYSTECTOMY;  Surgeon: Fredirick Maudlin, MD;  Location: [facility];  Service: General;  Laterality: N/A;  . COLONOSCOPY    . ESOPHAGOGASTRODUODENOSCOPY (EGD) WITH PROPOFOL N/A 06/12/2018   Procedure: ESOPHAGOGASTRODUODENOSCOPY (EGD) WITH PROPOFOL;  Surgeon: Lin Landsman, MD;  Location: [facility];  Service: Gastroenterology;  Laterality: N/A;  . HERNIA REPAIR  1990   bilateral inguinal  . PUBOVAGINAL SLING N/A 08/02/2018   Procedure: PUBO-VAGINAL SLING- TVT;  Surgeon: Homero Fellers, MD;  Location: [facility];  Service: Gynecology;  Laterality: N/A;  . THYROIDECTOMY  2007   thyroid cancer  . TOTAL ABDOMINAL HYSTERECTOMY  2008   dymenorrhea     OB History    Gravida  2   Para  2   Term  2   Preterm      AB      Living  2     SAB      TAB      Ectopic      Multiple      Live Births  2           Family History  Problem Relation Age of Onset  . Hyperlipidemia Father   . Hypertension Father   . Clotting disorder Sister   . Hepatitis B Brother   . Breast cancer Paternal Grandmother 26       with mets  . Cervical cancer Mother        dx. in her early 52s  . Breast cancer Paternal Aunt 74  . Throat cancer Paternal Aunt 87  . Breast cancer Paternal Aunt 75  . Breast cancer Paternal Aunt        74  . Skin cancer Paternal Aunt   . Cancer Cousin        unknown type dx. in her late 20s/early 54s  . Skin cancer Cousin        13 cancerous moles removed in her 49s    Social History   Tobacco Use  . Smoking status: Never Smoker  . Smokeless tobacco: Never Used  Vaping Use  . Vaping Use: Never used  Substance Use Topics  . Alcohol use: Yes    Comment: Occasionally - very rarely  . Drug use: No    Home Medications Prior to Admission medications   Medication Sig Start Date End Date Taking? Authorizing Provider  Cholecalciferol (VITAMIN D3) 50 MCG (2000 UT) capsule Take 2,000 Units by mouth daily.     [provider]  [facility] Liver Oil CAPS Take 1 capsule by mouth at bedtime.     [provider]  diclofenac Sodium (VOLTAREN) 1 % GEL Apply 2 g topically 4 (four) times daily. 11/06/19   [provider]  fluticasone (FLONASE) 50 MCG/ACT nasal spray Place 2 sprays into both nostrils daily as needed.     [provider]  LINZESS 290 MCG CAPS capsule TAKE 1 CAPSULE (290 MCG TOTAL) BY MOUTH 2 (TWO) TIMES DAILY BEFORE A MEAL 01/27/20   Nandigam, Venia Minks, MD  montelukast (SINGULAIR) 10 MG tablet TAKE 1 TABLET BY MOUTH EVERYDAY AT BEDTIME 04/03/20   Burnard Hawthorne, FNP  Multiple Vitamin (MULTI-VITAMIN) tablet Take by mouth.    [provider]  SUMAtriptan (IMITREX) 50 MG tablet TAKE ONE TABLET BY MOUTH AT ONSET OF MIGRAINE - MAY REPEAT DOSE IN 2 HOURS IF HEADACHE PERSISTS 01/02/20   Einar Pheasant, MD  SYNTHROID 88 MCG tablet Take 1 tablet (88 mcg total) by mouth daily before breakfast. 03/25/20   Renato Shin, MD    Allergies    Darvon [propoxyphene], Onion, Percocet [oxycodone-acetaminophen], and Vicodin [hydrocodone-acetaminophen]  Review of Systems   Review of Systems  All other systems reviewed and are negative.   Physical Exam Updated Vital Signs BP 102/65   Pulse 61   Temp 97.9 F (36.6 C) (Oral)   Resp 15   Ht 5' (1.524 m)   Wt 61.2 kg   SpO2 100%   BMI 26.37 kg/m   Physical Exam Vitals and nursing note reviewed.  Constitutional:      General: She is not in acute distress.    Appearance: Normal appearance. She is normal weight.  HENT:     Head: Normocephalic and atraumatic.     Right Ear: External ear normal.     Left Ear: External ear normal.     Mouth/Throat:     Mouth: Mucous membranes are dry.     Pharynx: No oropharyngeal exudate or posterior oropharyngeal erythema.  Eyes:     Extraocular Movements: Extraocular movements intact.     Conjunctiva/sclera: Conjunctivae normal.     Pupils: Pupils are equal, round, and reactive  to light.  Cardiovascular:     Rate and Rhythm: Normal rate and regular rhythm.     Pulses: Normal pulses.     Heart sounds: Normal heart sounds.  Pulmonary:     Effort: Pulmonary effort is normal. No respiratory distress.     Breath sounds: Normal breath sounds.  Abdominal:     General: There is distension.     Palpations: Abdomen is soft. There is no mass.     Tenderness: There is abdominal tenderness.       Comments: Patient has some pain in the suprapubic area which she states is chronic and not related to the events tonight.  She has very minimal tenderness to palpation in the right upper quadrant but that is the area she states she was having the severe pain earlier.  Musculoskeletal:        General: Normal range of motion.     Cervical back: Normal range of motion.  Skin:    General: Skin is warm and dry.     Findings: No rash.  Neurological:     General: No focal deficit present.     Mental Status: She is alert and oriented to person, place, and time.     Cranial Nerves: No cranial nerve deficit.  Psychiatric:        Mood and Affect: Mood is anxious.        Behavior: Behavior normal.  Thought Content: Thought content normal.     ED Results / Procedures / Treatments   Labs (all labs ordered are listed, but only abnormal results are displayed) Results for orders placed or performed during the hospital encounter of 05/19/20  Comprehensive metabolic panel  Result Value Ref Range   Sodium 138 135 - 145 mmol/L   Potassium 3.2 (L) 3.5 - 5.1 mmol/L   Chloride 103 98 - 111 mmol/L   CO2 25 22 - 32 mmol/L   Glucose, Bld 118 (H) 70 - 99 mg/dL   BUN <5 (L) 6 - 20 mg/dL   Creatinine, Ser 0.53 0.44 - 1.00 mg/dL   Calcium 9.3 8.9 - 10.3 mg/dL   Total Protein 8.3 (H) 6.5 - 8.1 g/dL   Albumin 4.6 3.5 - 5.0 g/dL   AST 21 15 - 41 U/L   ALT 14 0 - 44 U/L   Alkaline Phosphatase 76 38 - 126 U/L   Total Bilirubin 0.5 0.3 - 1.2 mg/dL   GFR calc non Af Amer >60 >60 mL/min    GFR calc Af Amer >60 >60 mL/min   Anion gap 10 5 - 15  Lipase, blood  Result Value Ref Range   Lipase 22 11 - 51 U/L  CBC with Differential  Result Value Ref Range   WBC 9.4 4.0 - 10.5 K/uL   RBC 4.54 3.87 - 5.11 MIL/uL   Hemoglobin 13.7 12.0 - 15.0 g/dL   HCT 41.0 36 - 46 %   MCV 90.3 80.0 - 100.0 fL   MCH 30.2 26.0 - 34.0 pg   MCHC 33.4 30.0 - 36.0 g/dL   RDW 12.7 11.5 - 15.5 %   Platelets 347 150 - 400 K/uL   nRBC 0.0 0.0 - 0.2 %   Neutrophils Relative % 83 %   Neutro Abs 7.8 (H) 1.7 - 7.7 K/uL   Lymphocytes Relative 14 %   Lymphs Abs 1.3 0.7 - 4.0 K/uL   Monocytes Relative 3 %   Monocytes Absolute 0.3 0 - 1 K/uL   Eosinophils Relative 0 %   Eosinophils Absolute 0.0 0 - 0 K/uL   Basophils Relative 0 %   Basophils Absolute 0.0 0 - 0 K/uL   Immature Granulocytes 0 %   Abs Immature Granulocytes 0.03 0.00 - 0.07 K/uL  CBG monitoring, ED  Result Value Ref Range   Glucose-Capillary 122 (H) 70 - 99 mg/dL   Laboratory interpretation all normal except hypokalemia    EKG EKG Interpretation  Date/Time:  Tuesday May 19 2020 05:24:26 EDT Ventricular Rate:  62 PR Interval:    QRS Duration: 111 QT Interval:  424 QTC Calculation: 431 R Axis:   82 Text Interpretation: Sinus rhythm Short PR interval RSR' in V1 or V2, right VCD or RVH No old tracing to compare Confirmed by Rolland Porter [phone]) on 05/19/2020 6:33:15 AM   Radiology No results found.  Procedures Procedures (including critical care time)  Medications Ordered in ED Medications  ondansetron (ZOFRAN) injection 4 mg (4 mg Intravenous Not Given 05/19/20 0636)  fentaNYL (SUBLIMAZE) injection 25 mcg (25 mcg Intravenous Not Given 05/19/20 0637)  iohexol (OMNIPAQUE) 300 MG/ML solution 100 mL (has no administration in time range)  sodium chloride 0.9 % bolus 1,000 mL (1,000 mLs Intravenous New Bag/Given 05/19/20 8453)    ED Course  I have reviewed the triage vital signs and the nursing notes.  Pertinent labs &  imaging results that were available during my care of the patient were  reviewed by me and considered in my medical decision making (see chart for details).    MDM Rules/Calculators/A&P                          Patient has had several abdominal surgeries including cholecystectomy and a abdominal hysterectomy and I pubovaginal sling.  We discussed that she could have colitis, intermittent bowel obstruction, or less likely of renal stone.  Therefore we proceeded to IV fluids, lab work and CT abdomen and pelvis.   Patient turned over to Dr. Stark Jock at change of shift to get the results of her CT abdomen/pelvis.  Her near syncope seems to be in response to pain and nausea that she had, I do not feel like she is going to need a cardiology evaluation or referral.  She has a gastroenterologist she can follow-up with, she does not recall the name but she should have the information at home.  Looking at her chart it looks like she has been seen by Dr. Marius Ditch, gastroenterologist at [facility].  We also discussed she could be having an abdominal migraine since both times she had the abdominal pain it was preceded by her feeling like she was go have a migraine headache.  She does not have a neurologist.  Final Clinical Impression(s) / ED Diagnoses Final diagnoses:  RUQ pain  Near syncope  Nausea  Light headedness    Rx / DC Orders  Disposition pending  Rolland Porter, MD, Barbette Or, MD 05/19/20 [phone]

## 2020-05-19 NOTE — Discharge Instructions (Addendum)
Follow-up with your gastroenterologist in the next few days if not improving, and return to the ER if you develop worsening pain, high fever, bloody stool or urine, or other new and concerning symptoms.

## 2020-05-19 NOTE — ED Notes (Signed)
Patient transported to CT 

## 2020-05-20 ENCOUNTER — Other Ambulatory Visit: Payer: Self-pay | Admitting: Endocrinology

## 2020-05-21 ENCOUNTER — Other Ambulatory Visit: Payer: Self-pay

## 2020-05-21 DIAGNOSIS — Z20822 Contact with and (suspected) exposure to covid-19: Secondary | ICD-10-CM | POA: Diagnosis not present

## 2020-05-21 DIAGNOSIS — Z03818 Encounter for observation for suspected exposure to other biological agents ruled out: Secondary | ICD-10-CM | POA: Diagnosis not present

## 2020-05-22 ENCOUNTER — Encounter: Payer: Self-pay | Admitting: Gastroenterology

## 2020-05-22 ENCOUNTER — Ambulatory Visit (INDEPENDENT_AMBULATORY_CARE_PROVIDER_SITE_OTHER): Payer: BC Managed Care – PPO | Admitting: Gastroenterology

## 2020-05-22 ENCOUNTER — Other Ambulatory Visit: Payer: Self-pay

## 2020-05-22 VITALS — BP 101/69 | HR 79 | Temp 98.0°F | Ht 60.0 in | Wt 137.0 lb

## 2020-05-22 DIAGNOSIS — A048 Other specified bacterial intestinal infections: Secondary | ICD-10-CM | POA: Diagnosis not present

## 2020-05-22 DIAGNOSIS — K5904 Chronic idiopathic constipation: Secondary | ICD-10-CM

## 2020-05-22 NOTE — Progress Notes (Signed)
Cephas Darby, MD [address]  [place]  [place], [place] [address]  Main: [phone]  Fax: [phone] Pager: [phone]   Primary Care Physician: Leone Haven, MD  Primary Gastroenterologist:  Dr. Cephas Darby  Chief Complaint  Patient presents with   New Patient (Initial Visit)   Follow-up    Patient states she has constant abdominal pain that is upper abdominal pain.    Nausea    Patient has nausea all the time    Diarrhea    Has diarrhea 3-4 times a day    Gastroesophageal Reflux    Has belching all the time     HPI: Sarah Moran is a 39 y.o. female with history of chronic constipation, currently maintained on Linzess 290 MCG daily.  Patient is here as a ER follow-up, she experienced severe upper abdominal pain associated with nausea and vomiting on 05/20/2019.  Her pain started after she took [place] in the evening.  She also reports bloating, early satiety.  Labs were unremarkable as well as CT abdomen pelvis with contrast.  Her thyroid medication has been adjusted which is leading to change in bowel habits as well.  Overall, she reports that she is not constipated, currently 2-3 soft bowel movements per day.  She takes MiraLAX as needed and sometimes takes 2 doses of Linzess.  She acknowledges that her diet is devoid of fiber as she does not have any desire to incorporate fruits and vegetables in her daily meals.  She is taking probiotics which does not seem to help. She eats out at least once a week.  She had history of Helicobacter pylori infection which was treated with confirmed eradication in late 2019.  She denies any weight loss  She is no longer experiencing abdominal pain, nausea or vomiting.  Patient is also going through lot of stress lately  Current Outpatient Medications  Medication Sig Dispense Refill   Cholecalciferol (VITAMIN D3) 50 MCG (2000 UT) capsule Take 2,000 Units by mouth daily.     Cod Liver Oil CAPS Take 1  capsule by mouth at bedtime.      diclofenac Sodium (VOLTAREN) 1 % GEL Apply 2 g topically 4 (four) times daily.     fluticasone (FLONASE) 50 MCG/ACT nasal spray Place 2 sprays into both nostrils daily as needed.      LINZESS 290 MCG CAPS capsule TAKE 1 CAPSULE (290 MCG TOTAL) BY MOUTH 2 (TWO) TIMES DAILY BEFORE A MEAL 180 capsule 3   montelukast (SINGULAIR) 10 MG tablet TAKE 1 TABLET BY MOUTH EVERYDAY AT BEDTIME 90 tablet 1   Multiple Vitamin (MULTI-VITAMIN) tablet Take by mouth.     SUMAtriptan (IMITREX) 50 MG tablet TAKE ONE TABLET BY MOUTH AT ONSET OF MIGRAINE - MAY REPEAT DOSE IN 2 HOURS IF HEADACHE PERSISTS 10 tablet 2   SYNTHROID 88 MCG tablet TAKE 1 TABLET (88 MCG TOTAL) BY MOUTH DAILY BEFORE BREAKFAST. 30 tablet 2   No current facility-administered medications for this visit.    Allergies as of 05/22/2020 - Review Complete 05/22/2020  Allergen Reaction Noted   Darvon [propoxyphene] Other (See Comments) 09/23/2013   Onion Other (See Comments) 07/27/2018   Percocet [oxycodone-acetaminophen] Hives 09/23/2013   Vicodin [hydrocodone-acetaminophen] Hives 09/23/2013    NSAIDs: None  Antiplts/Anticoagulants/Anti thrombotics: None  GI procedures: EGD 06/12/2018 - Normal duodenal bulb and second portion of the duodenum. Biopsied. - Erythematous mucosa in the gastric body. Biopsied. - Normal cardia, gastric fundus, antrum, prepyloric  region of the stomach and pylorus. Biopsied. - Normal gastroesophageal junction and esophagus. - Esophagogastric landmarks identified.  DIAGNOSIS:  A. DUODENUM; COLD BIOPSY:  - PEPTIC DUODENITIS.  - NEGATIVE FOR DYSPLASIA AND MALIGNANCY.   B. STOMACH; COLD BIOPSY:  - MARKED CHRONIC ACTIVE HELICOBACTER ASSOCIATED GASTRITIS.  - NEGATIVE FOR DYSPLASIA AND MALIGNANCY.   ROS:  General: Negative for anorexia, weight loss, fever, chills, fatigue, weakness. ENT: Negative for hoarseness, difficulty swallowing , nasal congestion. CV: Negative  for chest pain, angina, palpitations, dyspnea on exertion, peripheral edema.  Respiratory: Negative for dyspnea at rest, dyspnea on exertion, cough, sputum, wheezing.  GI: See history of present illness. GU:  Negative for dysuria, hematuria, urinary incontinence, urinary frequency, nocturnal urination.  Endo: Negative for unusual weight change.    Physical Examination:   BP 101/69 (BP Location: Left Arm, Patient Position: Sitting, Cuff Size: Normal)    Pulse 79    Temp 98 F (36.7 C) (Oral)    Ht 5' (1.524 m)    Wt 137 lb (62.1 kg)    BMI 26.76 kg/m   General: Well-nourished, well-developed in no acute distress.  Eyes: No icterus. Conjunctivae pink. Mouth: Oropharyngeal mucosa moist and pink , no lesions erythema or exudate. Lungs: Clear to auscultation bilaterally. Non-labored. Heart: Regular rate and rhythm, no murmurs rubs or gallops.  Abdomen: Bowel sounds are normal, nontender, nondistended, no hepatosplenomegaly or masses, no hernia , no rebound or guarding.   Extremities: No lower extremity edema. No clubbing or deformities. Neuro: Alert and oriented x 3.  Grossly intact. Skin: Warm and dry, no jaundice.   Psych: Alert and cooperative, normal mood and affect.   Imaging Studies: CT Abdomen Pelvis W Contrast  Result Date: 05/19/2020 CLINICAL DATA:  Abdominal distension, bloating, right upper quadrant pain EXAM: CT ABDOMEN AND PELVIS WITH CONTRAST TECHNIQUE: Multidetector CT imaging of the abdomen and pelvis was performed using the standard protocol following bolus administration of intravenous contrast. CONTRAST:  123mL OMNIPAQUE IOHEXOL 300 MG/ML  SOLN COMPARISON:  05/08/2018 FINDINGS: Lower chest: No acute abnormality. Hepatobiliary: No focal liver abnormality is seen. Simple cysts. Status post cholecystectomy. Postoperative biliary dilatation. Pancreas: Unremarkable. No pancreatic ductal dilatation or surrounding inflammatory changes. Spleen: Normal in size without significant  abnormality. Adrenals/Urinary Tract: Adrenal glands are unremarkable. Kidneys are normal, without renal calculi, solid lesion, or hydronephrosis. Bladder is unremarkable. Stomach/Bowel: Stomach is within normal limits. Appendix appears normal. No evidence of bowel wall thickening, distention, or inflammatory changes. Vascular/Lymphatic: No significant vascular findings are present. No enlarged abdominal or pelvic lymph nodes. Reproductive: Status post hysterectomy. Simple cyst of the right ovary. Other: No abdominal wall hernia or abnormality. No abdominopelvic ascites. Musculoskeletal: No acute or significant osseous findings. Partially imaged thoracolumbar fusion. IMPRESSION: 1. No acute CT findings of the abdomen or pelvis to explain abdominal pain or bloating. 2. Status post cholecystectomy with postoperative biliary ductal dilatation. Electronically Signed   By: Eddie Candle M.D.   On: 05/19/2020 08:06    Assessment and Plan:   Sarah Moran is a 39 y.o. female s/p cholecystectomy, history of chronic constipation on Linzess 290 MCG daily, history of cystocele and rectocele repair with recent onset of upper abdominal pain associated with bloating, nausea and vomiting.  Labs and imaging were unremarkable.  Currently, pain has resolved.  Her pain could be multifactorial with history of chronic constipation, stress, poor dietary habits and possible recurrence of H. pylori infection  Recommend H. pylori breath test Discussed about high-fiber diet   Follow  up as needed   Dr Sherri Sear, MD

## 2020-05-24 LAB — H. PYLORI BREATH TEST: H pylori Breath Test: NEGATIVE

## 2020-06-13 ENCOUNTER — Other Ambulatory Visit: Payer: Self-pay | Admitting: Endocrinology

## 2020-06-23 ENCOUNTER — Encounter: Payer: Self-pay | Admitting: Family Medicine

## 2020-06-24 ENCOUNTER — Encounter: Payer: Self-pay | Admitting: Family Medicine

## 2020-06-29 ENCOUNTER — Telehealth (INDEPENDENT_AMBULATORY_CARE_PROVIDER_SITE_OTHER): Payer: BC Managed Care – PPO | Admitting: Family Medicine

## 2020-06-29 ENCOUNTER — Other Ambulatory Visit: Payer: Self-pay

## 2020-06-29 ENCOUNTER — Encounter: Payer: Self-pay | Admitting: Family Medicine

## 2020-06-29 DIAGNOSIS — E039 Hypothyroidism, unspecified: Secondary | ICD-10-CM

## 2020-06-29 DIAGNOSIS — F32A Depression, unspecified: Secondary | ICD-10-CM

## 2020-06-29 DIAGNOSIS — G43909 Migraine, unspecified, not intractable, without status migrainosus: Secondary | ICD-10-CM

## 2020-06-29 DIAGNOSIS — J309 Allergic rhinitis, unspecified: Secondary | ICD-10-CM

## 2020-06-29 DIAGNOSIS — F419 Anxiety disorder, unspecified: Secondary | ICD-10-CM | POA: Diagnosis not present

## 2020-06-29 NOTE — Progress Notes (Signed)
Virtual Visit via video Note  This visit type was conducted due to national recommendations for restrictions regarding the COVID-19 pandemic (e.g. social distancing).  This format is felt to be most appropriate for this patient at this time.  All issues noted in this document were discussed and addressed.  No physical exam was performed (except for noted visual exam findings with Video Visits).   I connected with Sarah Moran today at  9:00 AM EDT by a video enabled telemedicine application and verified that I am speaking with the correct person using two identifiers. Location patient: home Location provider: work  Persons participating in the virtual visit: patient, provider  I discussed the limitations, risks, security and privacy concerns of performing an evaluation and management service by telephone and the availability of in person appointments. I also discussed with the patient that there may be a patient responsible charge related to this service. The patient expressed understanding and agreed to proceed.  Reason for visit: f/u.  HPI: Hypothyroidism/sleep issues/anxiety: Patient notes her Synthroid dose has been changed multiple times over the last 6 months.  She is currently on 88 mcg once daily.  She notes no skin changes.  She does note an increase in headaches recently and trouble sleeping though this has been going on for quite a while.  She has been more emotional recently and has had some crying spells.  Also notes some tension knots in her neck.  She possibly has some anxiety and depression.  No SI.  She has been in the emergency room recently for some abdominal pain and palpitations.  Work-up was unremarkable for cause and patient possibly was having an anxiety attack.  Headaches/allergies: Patient notes the Imitrex was not helping with her migraines though she started on Claritin, and has been on Singulair and Flonase and notes once starting on Claritin with the Singulair and  Flonase her headaches have improved significantly.  Typically has allergy issues in the fall and spring.  She also had some dry eyes for a couple of weeks.   ROS: See pertinent positives and negatives per HPI.  Past Medical History:  Diagnosis Date  . Allergy   . Anal fissure   . Anxiety    no meds  . Chronic pelvic pain in female   . Family history of breast cancer    My Risk neg 5/18  . Family history of cervical cancer   . Family history of skin cancer   . Family history of throat cancer   . Frequent headaches   . Gallstone   . Gallstones   . Genetic testing of female 01/2017   My Risk/BRCA neg  . GERD (gastroesophageal reflux disease)    well controlled. Takes gingerroot when symptoms  . Hypothyroidism    s/p thyroidectomy - levothyroxine 75 mcg daily  . IBS (irritable bowel syndrome)    constipation - takes miralax daily  . Increased risk of breast cancer 01/2017   IBIS=18.8%/riskscore=21.9%  . Liver cyst 04/2018  . Migraine    takes excedrin. She has tried imitrex in the past   . Personal history of radiation therapy   . Seizure ([place]) 1997    x 1-age 77-after surgery for scoliosis  . Thyroid cancer ([place]) 2007   s/p thyroidectomy    Past Surgical History:  Procedure Laterality Date  . ANAL RECTAL MANOMETRY N/A 10/12/2018   Procedure: ANO RECTAL MANOMETRY;  Surgeon: Mauri Pole, MD;  Location: [facility];  Service: Endoscopy;  Laterality:  N/A;  . ANTERIOR AND POSTERIOR REPAIR WITH SACROSPINOUS FIXATION N/A 08/02/2018   Procedure: ANTERIOR AND POSTERIOR REPAIR;  Surgeon: Homero Fellers, MD;  Location: [facility];  Service: Gynecology;  Laterality: N/A;  . BACK SURGERY  1997   scoliosis  . CHOLECYSTECTOMY N/A 09/21/2018   Procedure: LAPAROSCOPIC CHOLECYSTECTOMY;  Surgeon: Fredirick Maudlin, MD;  Location: [facility];  Service: General;  Laterality: N/A;  . COLONOSCOPY    . ESOPHAGOGASTRODUODENOSCOPY (EGD) WITH PROPOFOL N/A 06/12/2018   Procedure:  ESOPHAGOGASTRODUODENOSCOPY (EGD) WITH PROPOFOL;  Surgeon: Lin Landsman, MD;  Location: [facility];  Service: Gastroenterology;  Laterality: N/A;  . HERNIA REPAIR  1990   bilateral inguinal  . PUBOVAGINAL SLING N/A 08/02/2018   Procedure: PUBO-VAGINAL SLING- TVT;  Surgeon: Homero Fellers, MD;  Location: [facility];  Service: Gynecology;  Laterality: N/A;  . THYROIDECTOMY  2007   thyroid cancer  . TOTAL ABDOMINAL HYSTERECTOMY  2008   dymenorrhea    Family History  Problem Relation Age of Onset  . Hyperlipidemia Father   . Hypertension Father   . Clotting disorder Sister   . Hepatitis B Brother   . Breast cancer Paternal Grandmother 50       with mets  . Cervical cancer Mother        dx. in her early 33s  . Breast cancer Paternal Aunt 69  . Throat cancer Paternal Aunt 17  . Breast cancer Paternal Aunt 53  . Breast cancer Paternal Aunt        67  . Skin cancer Paternal Aunt   . Cancer Cousin        unknown type dx. in her late 20s/early 53s  . Skin cancer Cousin        13 cancerous moles removed in her 49s    SOCIAL HX: Non-smoker   Current Outpatient Medications:  .  Cholecalciferol (VITAMIN D3) 50 MCG (2000 UT) capsule, Take 2,000 Units by mouth daily., Disp: , Rfl:  .  Cod Liver Oil CAPS, Take 1 capsule by mouth at bedtime. , Disp: , Rfl:  .  diclofenac Sodium (VOLTAREN) 1 % GEL, Apply 2 g topically 4 (four) times daily., Disp: , Rfl:  .  fluticasone (FLONASE) 50 MCG/ACT nasal spray, Place 2 sprays into both nostrils daily as needed. , Disp: , Rfl:  .  LINZESS 290 MCG CAPS capsule, TAKE 1 CAPSULE (290 MCG TOTAL) BY MOUTH 2 (TWO) TIMES DAILY BEFORE A MEAL, Disp: 180 capsule, Rfl: 3 .  montelukast (SINGULAIR) 10 MG tablet, TAKE 1 TABLET BY MOUTH EVERYDAY AT BEDTIME, Disp: 90 tablet, Rfl: 1 .  Multiple Vitamin (MULTI-VITAMIN) tablet, Take by mouth., Disp: , Rfl:  .  SUMAtriptan (IMITREX) 50 MG tablet, TAKE ONE TABLET BY MOUTH AT ONSET OF MIGRAINE - MAY REPEAT  DOSE IN 2 HOURS IF HEADACHE PERSISTS, Disp: 10 tablet, Rfl: 2 .  SYNTHROID 88 MCG tablet, TAKE 1 TABLET (88 MCG TOTAL) BY MOUTH DAILY BEFORE BREAKFAST., Disp: 30 tablet, Rfl: 2  EXAM:  VITALS per patient if applicable:  GENERAL: alert, oriented, appears well and in no acute distress  HEENT: atraumatic, conjunttiva clear, no obvious abnormalities on inspection of external nose and ears  NECK: normal movements of the head and neck  LUNGS: on inspection no signs of respiratory distress, breathing rate appears normal, no obvious gross SOB, gasping or wheezing  CV: no obvious cyanosis  MS: moves all visible extremities without noticeable abnormality  PSYCH/NEURO: pleasant and cooperative, no obvious depression or  anxiety, speech and thought processing grossly intact  ASSESSMENT AND PLAN:  Discussed the following assessment and plan:  Problem List Items Addressed This Visit    Allergic rhinitis    I suspect this was contributing to her prior headaches.  She will continue Claritin 10 mg once daily, Singulair 10 mg once daily, and Flonase 2 sprays each nostril once daily.      Anxiety and depression    Does have some anxiety and depression symptoms.  Could be related to her thyroid and we will check that.  We will hold off on any medication until after her labs return.      Hypothyroidism    Certainly the patient's thyroid medication dose could be off and contributing to number of her symptoms.  We will have her in to check her thyroid levels and then determine the next step in management.  She will continue Synthroid 88 mcg once daily.      Migraine    Prior headaches seem to have been related to allergies.  Improved with addition of Claritin.  She will monitor.          I discussed the assessment and treatment plan with the patient. The patient was provided an opportunity to ask questions and all were answered. The patient agreed with the plan and demonstrated an understanding  of the instructions.   The patient was advised to call back or seek an in-person evaluation if the symptoms worsen or if the condition fails to improve as anticipated.    Tommi Rumps, MD

## 2020-06-30 ENCOUNTER — Telehealth: Payer: Self-pay | Admitting: *Deleted

## 2020-06-30 ENCOUNTER — Telehealth: Payer: Self-pay | Admitting: Family Medicine

## 2020-06-30 DIAGNOSIS — E039 Hypothyroidism, unspecified: Secondary | ICD-10-CM

## 2020-06-30 NOTE — Telephone Encounter (Signed)
Please place future orders for lab appt.  

## 2020-06-30 NOTE — Telephone Encounter (Signed)
Labs ordered.

## 2020-06-30 NOTE — Telephone Encounter (Signed)
Patient called in stated that she needed to make lab appointment she sheduled for 10-13 at 9:45 for her thyroid

## 2020-07-01 ENCOUNTER — Other Ambulatory Visit: Payer: Self-pay

## 2020-07-01 ENCOUNTER — Other Ambulatory Visit (INDEPENDENT_AMBULATORY_CARE_PROVIDER_SITE_OTHER): Payer: BC Managed Care – PPO

## 2020-07-01 DIAGNOSIS — Z23 Encounter for immunization: Secondary | ICD-10-CM | POA: Diagnosis not present

## 2020-07-01 DIAGNOSIS — E039 Hypothyroidism, unspecified: Secondary | ICD-10-CM | POA: Diagnosis not present

## 2020-07-01 LAB — T4, FREE: Free T4: 1.11 ng/dL (ref 0.60–1.60)

## 2020-07-01 LAB — TSH: TSH: 0.8 u[IU]/mL (ref 0.35–4.50)

## 2020-07-06 ENCOUNTER — Encounter: Payer: Self-pay | Admitting: Family Medicine

## 2020-07-06 NOTE — Assessment & Plan Note (Signed)
Certainly the patient's thyroid medication dose could be off and contributing to number of her symptoms.  We will have her in to check her thyroid levels and then determine the next step in management.  She will continue Synthroid 88 mcg once daily.

## 2020-07-06 NOTE — Assessment & Plan Note (Signed)
Prior headaches seem to have been related to allergies.  Improved with addition of Claritin.  She will monitor.

## 2020-07-06 NOTE — Assessment & Plan Note (Signed)
I suspect this was contributing to her prior headaches.  She will continue Claritin 10 mg once daily, Singulair 10 mg once daily, and Flonase 2 sprays each nostril once daily.

## 2020-07-06 NOTE — Assessment & Plan Note (Signed)
Does have some anxiety and depression symptoms.  Could be related to her thyroid and we will check that.  We will hold off on any medication until after her labs return.

## 2020-07-16 ENCOUNTER — Other Ambulatory Visit: Payer: Self-pay | Admitting: Family Medicine

## 2020-07-16 MED ORDER — SERTRALINE HCL 50 MG PO TABS: 50.0000 mg | ORAL_TABLET | Freq: Every day | ORAL | 3 refills | Status: DC

## 2020-07-20 ENCOUNTER — Encounter: Payer: Self-pay | Admitting: Family Medicine

## 2020-07-22 DIAGNOSIS — Z03818 Encounter for observation for suspected exposure to other biological agents ruled out: Secondary | ICD-10-CM | POA: Diagnosis not present

## 2020-07-22 DIAGNOSIS — Z20822 Contact with and (suspected) exposure to covid-19: Secondary | ICD-10-CM | POA: Diagnosis not present

## 2020-07-24 ENCOUNTER — Telehealth: Payer: Self-pay | Admitting: Family Medicine

## 2020-07-24 ENCOUNTER — Emergency Department: Payer: BC Managed Care – PPO

## 2020-07-24 ENCOUNTER — Encounter: Payer: Self-pay | Admitting: Emergency Medicine

## 2020-07-24 ENCOUNTER — Other Ambulatory Visit: Payer: Self-pay

## 2020-07-24 ENCOUNTER — Emergency Department
Admission: EM | Admit: 2020-07-24 | Discharge: 2020-07-24 | Disposition: A | Discharge status: Home / Self Care | Payer: BC Managed Care – PPO | Attending: Emergency Medicine | Admitting: Emergency Medicine

## 2020-07-24 DIAGNOSIS — R112 Nausea with vomiting, unspecified: Secondary | ICD-10-CM | POA: Diagnosis not present

## 2020-07-24 DIAGNOSIS — R29818 Other symptoms and signs involving the nervous system: Secondary | ICD-10-CM | POA: Diagnosis not present

## 2020-07-24 DIAGNOSIS — R079 Chest pain, unspecified: Secondary | ICD-10-CM | POA: Diagnosis not present

## 2020-07-24 DIAGNOSIS — R002 Palpitations: Secondary | ICD-10-CM | POA: Diagnosis not present

## 2020-07-24 DIAGNOSIS — R202 Paresthesia of skin: Secondary | ICD-10-CM | POA: Diagnosis not present

## 2020-07-24 DIAGNOSIS — R197 Diarrhea, unspecified: Secondary | ICD-10-CM | POA: Insufficient documentation

## 2020-07-24 DIAGNOSIS — E0590 Thyrotoxicosis, unspecified without thyrotoxic crisis or storm: Secondary | ICD-10-CM | POA: Insufficient documentation

## 2020-07-24 DIAGNOSIS — E039 Hypothyroidism, unspecified: Secondary | ICD-10-CM | POA: Diagnosis not present

## 2020-07-24 LAB — COMPREHENSIVE METABOLIC PANEL
ALT: 11 U/L (ref 0–44)
AST: 16 U/L (ref 15–41)
Albumin: 4.1 g/dL (ref 3.5–5.0)
Alkaline Phosphatase: 61 U/L (ref 38–126)
Anion gap: 9 (ref 5–15)
BUN: 6 mg/dL (ref 6–20)
CO2: 27 mmol/L (ref 22–32)
Calcium: 9 mg/dL (ref 8.9–10.3)
Chloride: 104 mmol/L (ref 98–111)
Creatinine, Ser: 0.57 mg/dL (ref 0.44–1.00)
GFR, Estimated: >60 mL/min (ref >60)
Glucose, Bld: 101 mg/dL — ABNORMAL HIGH (ref 70–99)
Potassium: 3.6 mmol/L (ref 3.5–5.1)
Sodium: 140 mmol/L (ref 135–145)
Total Bilirubin: 0.9 mg/dL (ref 0.3–1.2)
Total Protein: 7.1 g/dL (ref 6.5–8.1)

## 2020-07-24 LAB — TROPONIN I (HIGH SENSITIVITY)
Troponin I (High Sensitivity): <2 ng/L (ref <18)
Troponin I (High Sensitivity): 4 ng/L (ref <18)

## 2020-07-24 LAB — URINALYSIS, ROUTINE W REFLEX MICROSCOPIC
Bilirubin Urine: NEGATIVE
Glucose, UA: NEGATIVE mg/dL
Ketones, ur: 5 mg/dL — AB
Leukocytes,Ua: NEGATIVE
Nitrite: NEGATIVE
Protein, ur: NEGATIVE mg/dL
Specific Gravity, Urine: 1.004 — ABNORMAL LOW (ref 1.005–1.030)
pH: 8 (ref 5.0–8.0)

## 2020-07-24 LAB — CBC
HCT: 37.5 % (ref 36.0–46.0)
Hemoglobin: 12.9 g/dL (ref 12.0–15.0)
MCH: 30.2 pg (ref 26.0–34.0)
MCHC: 34.4 g/dL (ref 30.0–36.0)
MCV: 87.8 fL (ref 80.0–100.0)
Platelets: 338 10*3/uL (ref 150–400)
RBC: 4.27 MIL/uL (ref 3.87–5.11)
RDW: 12.6 % (ref 11.5–15.5)
WBC: 8.3 10*3/uL (ref 4.0–10.5)
nRBC: 0 % (ref 0.0–0.2)

## 2020-07-24 LAB — T4, FREE: Free T4: 1.47 ng/dL — ABNORMAL HIGH (ref 0.61–1.12)

## 2020-07-24 LAB — POC URINE PREG, ED: Preg Test, Ur: NEGATIVE

## 2020-07-24 IMAGING — CT CT HEAD W/O CM
3 series · 16 of 46 positions shown, 19 images · non-contrast
Comparison: No pertinent prior exams are available for comparison.

CLINICAL DATA: Neuro deficit, acute, stroke suspected. Additional
history provided: Patient reports chest pressure with tingling in
bilateral arms since [REDACTED].

EXAM:
CT HEAD WITHOUT CONTRAST
TECHNIQUE: Contiguous axial images were obtained from the base of the skull
through the vertex without intravenous contrast.

[Series 3: head wo · axial · 0.41mm/px · z∈[-108,+12]mm · 10 of 29 slices shown, 13 images]
[im 3/29  brain]
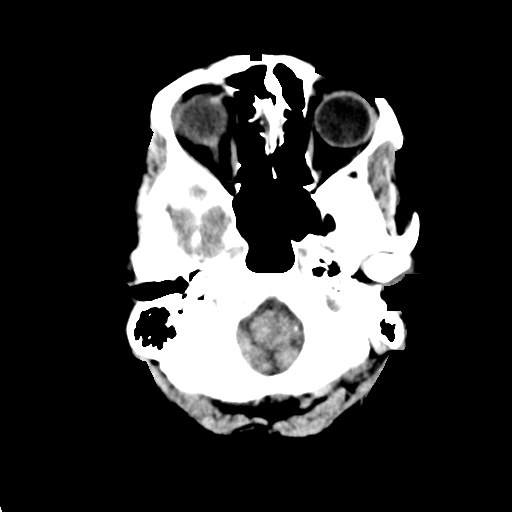
[im 3/29  bone]
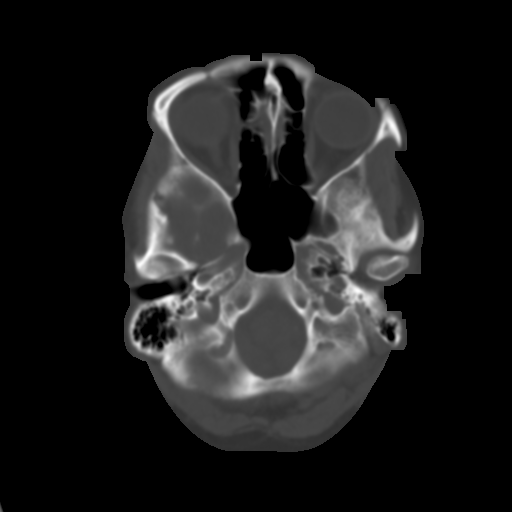
[im 6/29  brain]
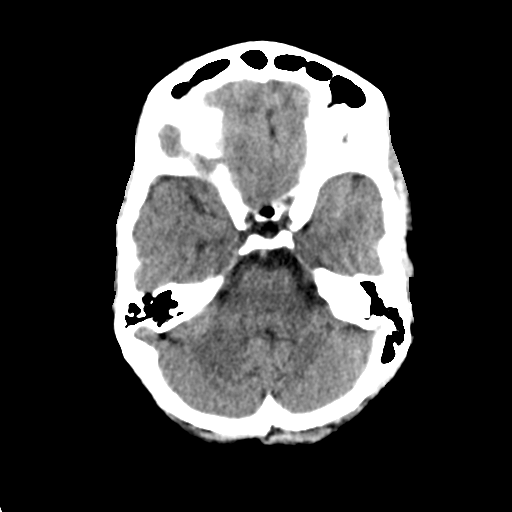
[im 8/29  brain]
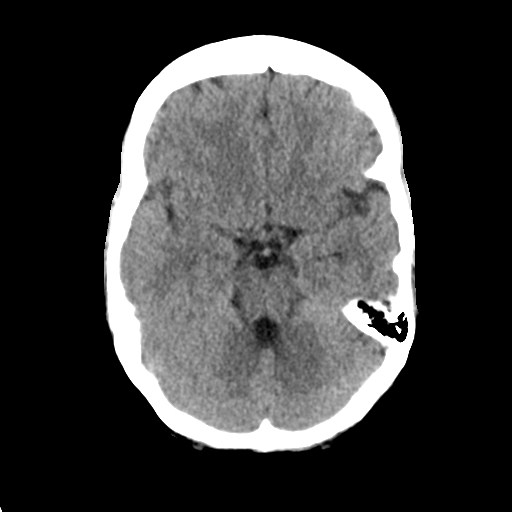
[im 11/29  brain]
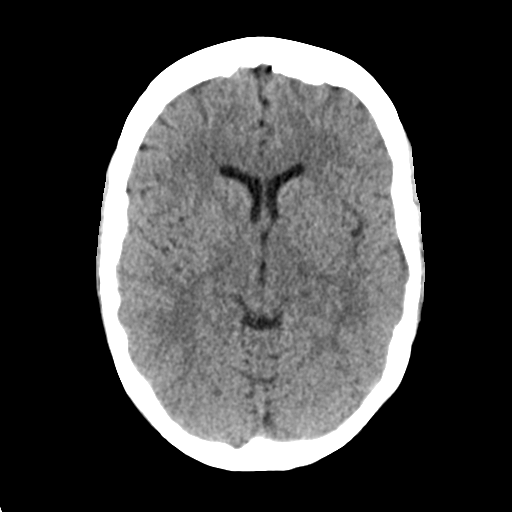
[im 14/29  brain]
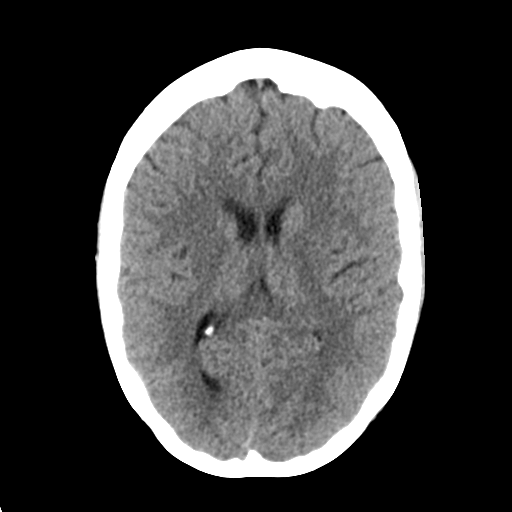
[im 14/29  bone]
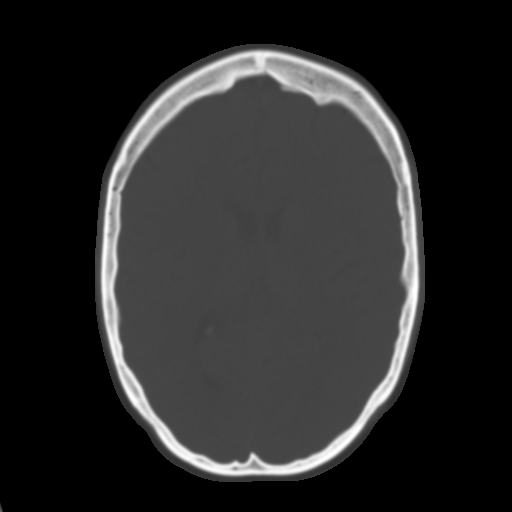
[im 16/29  brain]
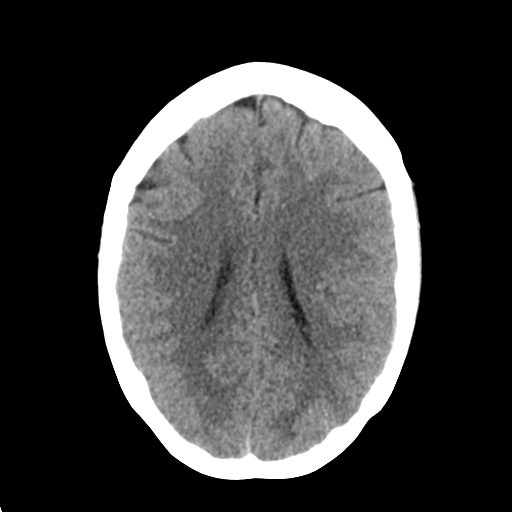
[im 19/29  brain]
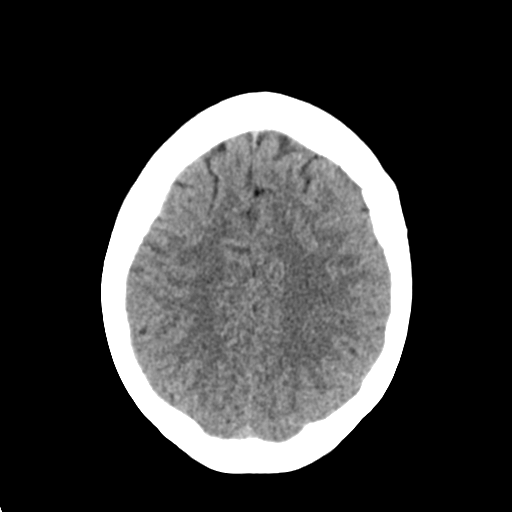
[im 22/29  brain]
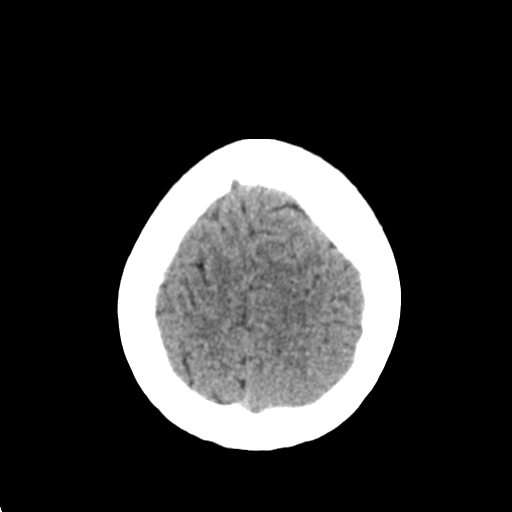
[im 24/29  brain]
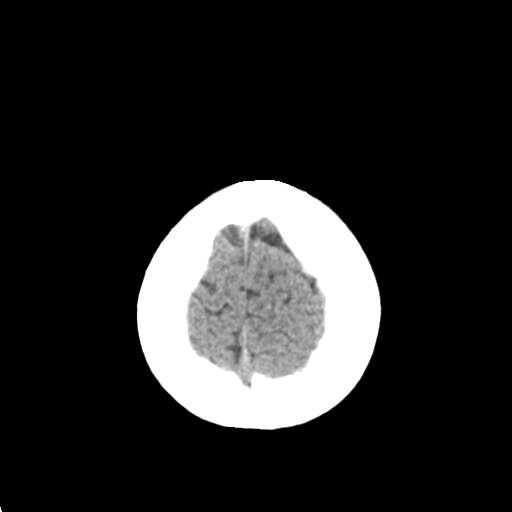
[im 24/29  bone]
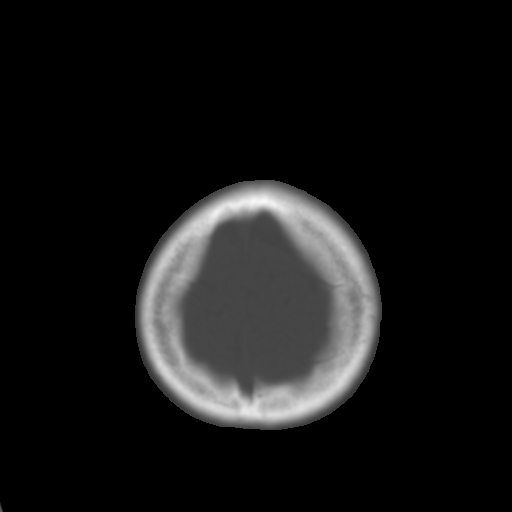
[im 27/29  brain]
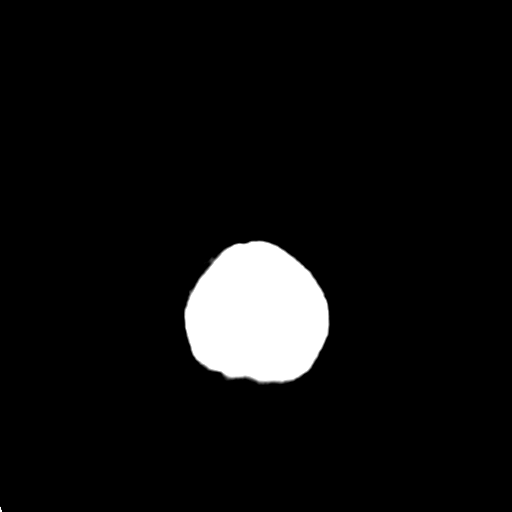

[Series 4: coronal soft tissue · coronal · 0.32mm/px · 3 of 64 slices shown]
[im 22/64  brain]
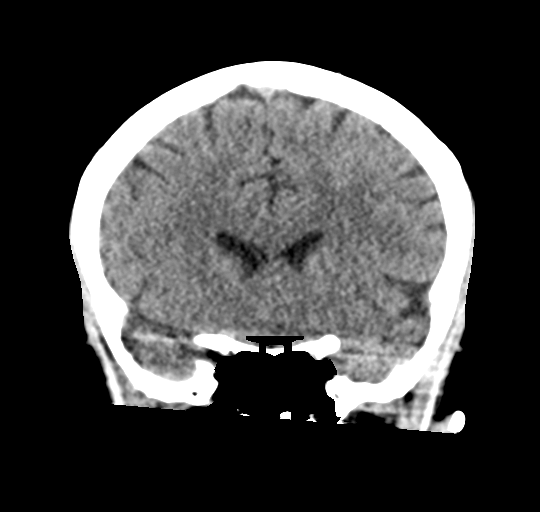
[im 29/64  brain]
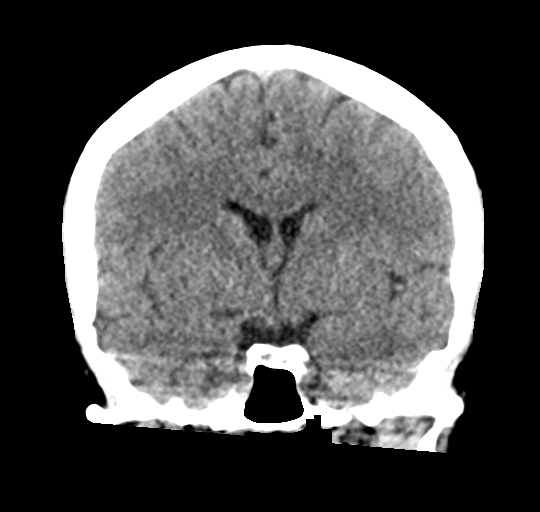
[im 36/64  brain]
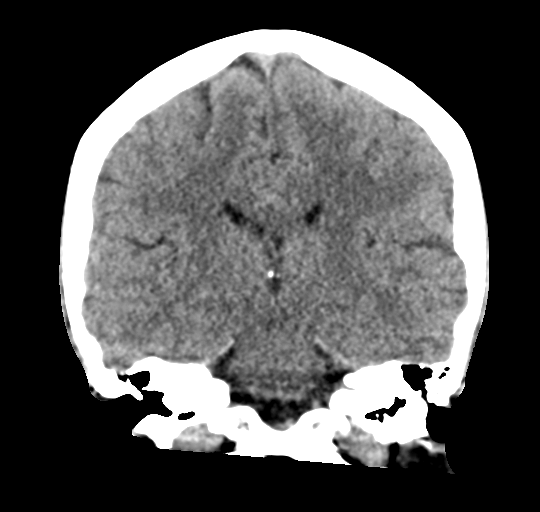

[Series 5: sagittal soft tissue · sagittal · 0.32mm/px · 3 of 48 slices shown]
[im 16/48  brain]
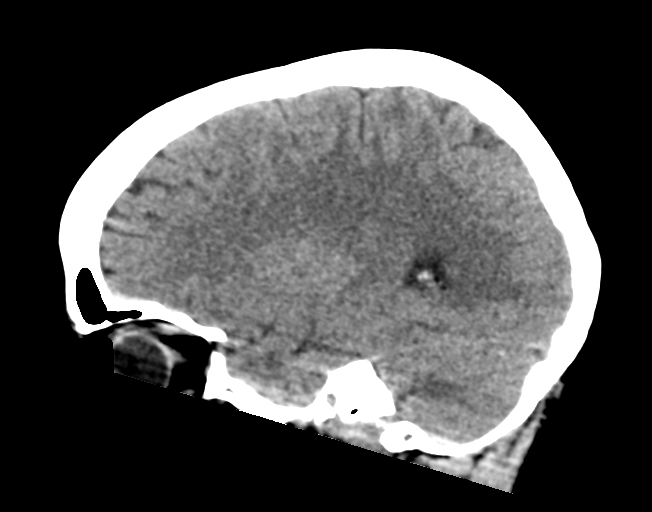
[im 24/48  brain]
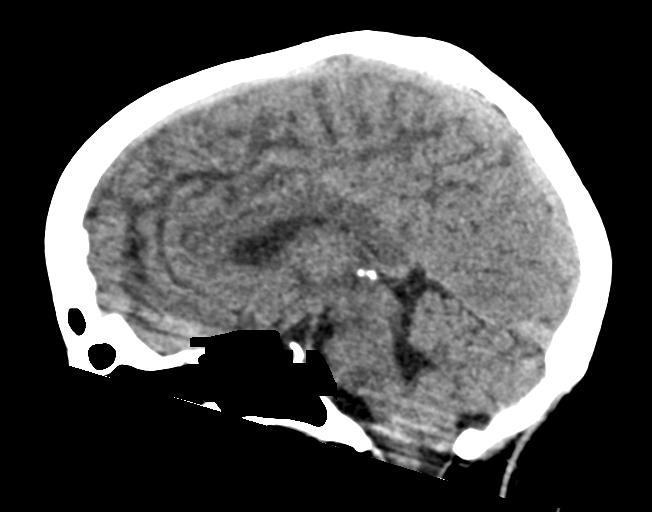
[im 32/48  brain]
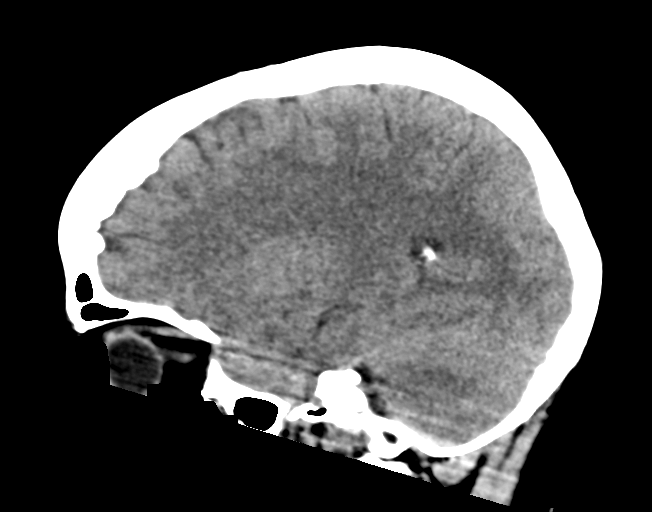

[16 of 46 positions shown; findings below may reference images not displayed]

FINDINGS: Brain:

Cerebral volume is normal.

There is no acute intracranial hemorrhage.

No demarcated cortical infarct.

No extra-axial fluid collection.

No evidence of intracranial mass.

No midline shift.

Vascular: No hyperdense vessel.

Skull: Normal. Negative for fracture or focal lesion.

Sinuses/Orbits: Visualized orbits show no acute finding. No
significant paranasal sinus disease at the imaged levels.
IMPRESSION: No evidence of acute intracranial abnormality.

## 2020-07-24 IMAGING — DX DG CHEST 1V PORT
1 series · 1 of 1 positions shown · non-contrast
Comparison: None.

CLINICAL DATA: Chest pain.

EXAM:
PORTABLE CHEST 1 VIEW

[chest ap]
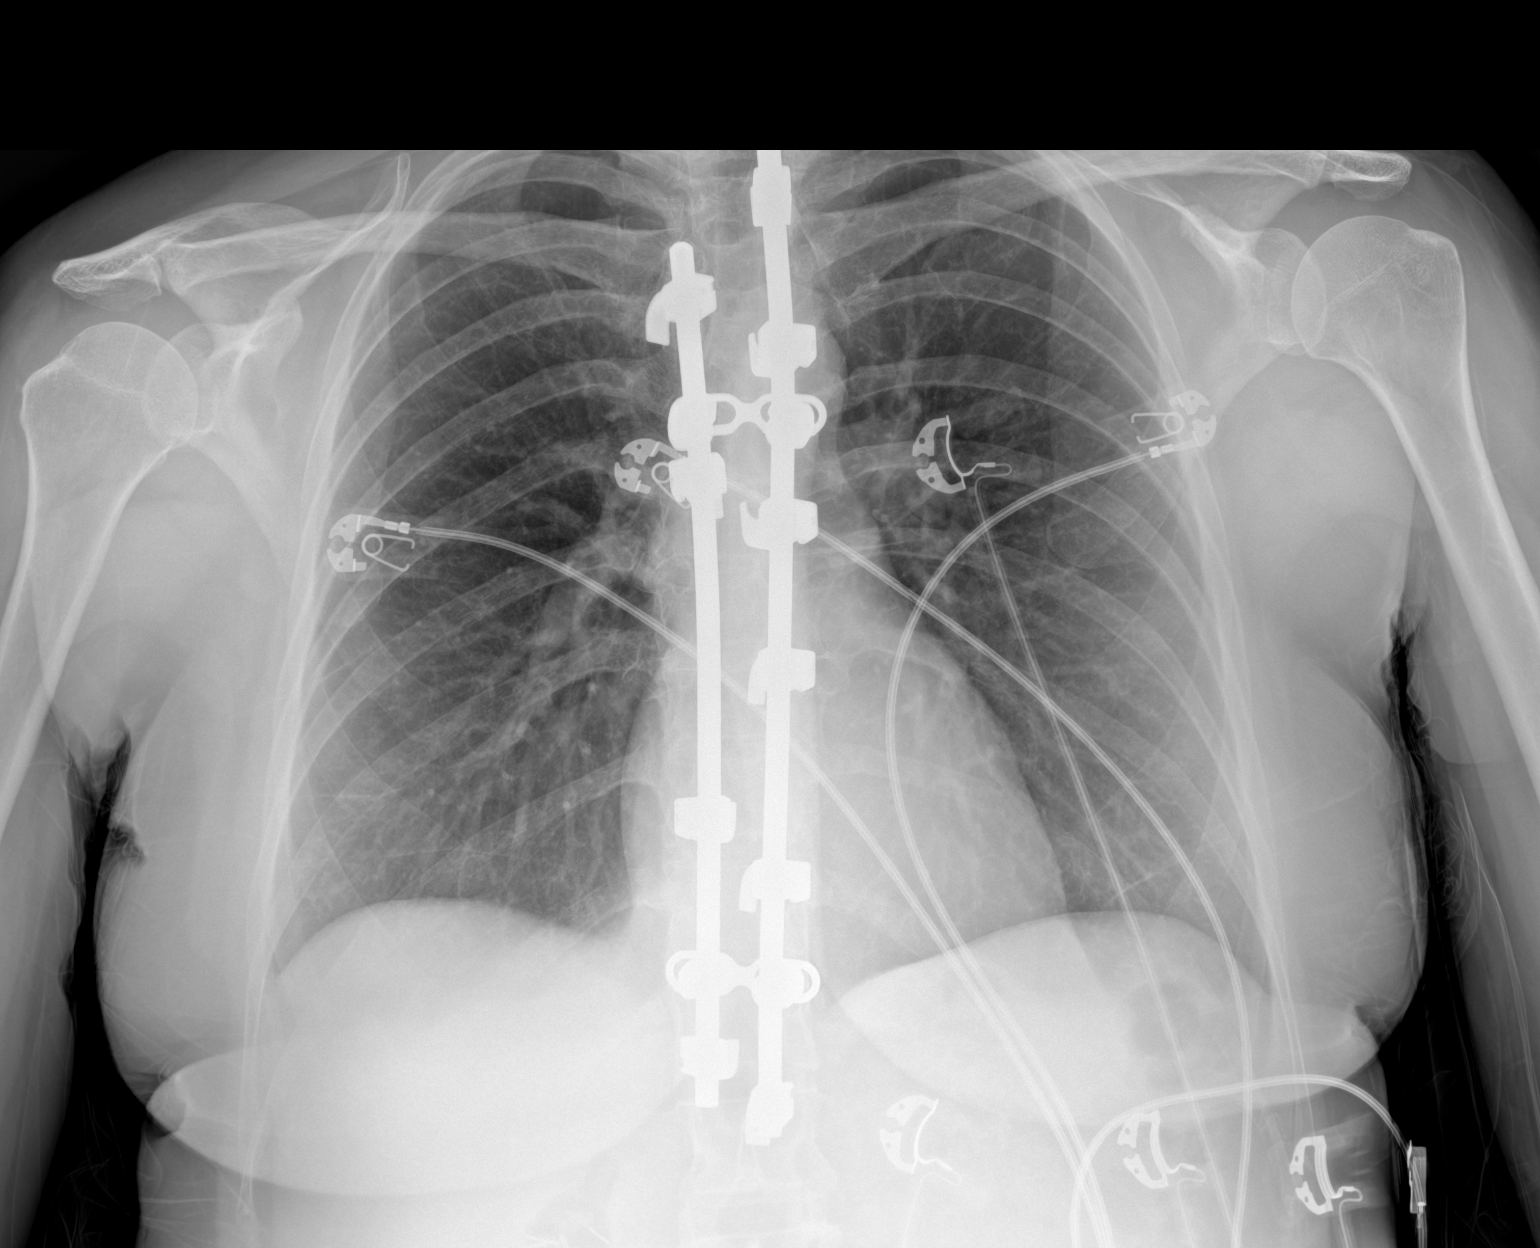

[1 of 1 positions shown; findings below may reference images not displayed]

FINDINGS: The heart size and mediastinal contours are within normal limits.
Both lungs are clear. No visible pleural effusions or pneumothorax.
Thoracic fusion hardware.
IMPRESSION: No acute cardiopulmonary disease.

## 2020-07-24 MED ORDER — DOXYCYCLINE HYCLATE 50 MG PO CAPS: 100.0000 mg | ORAL_CAPSULE | Freq: Two times a day (BID) | ORAL | 0 refills | Status: AC

## 2020-07-24 MED ORDER — ASPIRIN 81 MG PO CHEW
324.0000 mg | CHEWABLE_TABLET | Freq: Once | ORAL | Status: AC
  Administered 2020-07-24: 324 mg via ORAL
  Filled 2020-07-24: qty 4

## 2020-07-24 NOTE — Telephone Encounter (Signed)
[facility] RECORD [facility] Patient Name: Sarah Moran Gender: Female DOB: Apr 10, 1981 Age: 39 Y 70 M 10 D Return Phone Number: [phone] (Primary), [phone] (Secondary) Address: [place] [address] Client [facility] [facility] Client Site [place] - Day Physician Tommi Rumps - MD Contact Type Call Who Is Calling Patient / Member / Family / Caregiver Call Type Triage / Clinical Relationship To Patient Self Return Phone Number [phone] (Primary) Chief Complaint CHEST PAIN (>=21 years) - pain, pressure, heaviness or tightness Reason for Call Symptomatic / Request for Health Information Initial Comment Having tingling and numbness down both arms, pressure in her chest that goes to the left breast, happening more often. Sebastian Not Listed  Translation No Nurse Assessment Nurse: Jimmye Norman, RN, Whitney Date/Time (Eastern Time): 07/24/2020 9:30:42 AM Confirm and document reason for call. If symptomatic, describe symptoms. ---Having tingling and numbness down both arms, pressure in her chest that goes to the left breast, been happening all week at night. States she started zoloft and some herbs Friday and she started having nausea/vomiting/diarrhea, she stopped taking it. States Monday she still was having chest pressure, she stopped taking some herbs that she was taking. States Tuesday she had the same chest pain along with tingling/numbness down her arms. She tested negative for COVID at [facility]. Had symptoms again on Wednesday and Thursday night. Does the patient have any new or worsening symptoms? ---Yes Will a triage be completed? ---Yes Related visit to physician within the last 2 weeks? ---No Does the PT have any chronic conditions? (i.e. diabetes, asthma, this includes High risk factors for pregnancy, etc.)  ---Yes List chronic conditions. ---hypothyroidism, allergies, anxiety, autoimmune disease, pre diabetic Is the patient pregnant or possibly pregnant? (Ask all females between the ages of 54-55) ---No Is this a behavioral health or substance abuse call? ---No PLEASE NOTE: All timestamps contained within this report are represented as [place] Standard Time. CONFIDENTIALTY NOTICE: This fax transmission is intended only for the addressee. It contains information that is legally privileged, confidential or otherwise protected from use or disclosure. If you are not the intended recipient, you are strictly prohibited from reviewing, disclosing, copying using or disseminating any of this information or taking any action in reliance on or regarding this information. If you have received this fax in error, please notify us immediately by telephone so that we can arrange for its return to [place]. Phone: [phone], Toll-Free: [phone], Fax: [phone] Page: 2 of 2 Call Id: [id number] Guidelines Guideline Title Affirmed Question Affirmed Notes Nurse Date/Time Eilene Ghazi Time) Chest Pain [1] Chest pain lasts > 5 minutes AND [2] described as crushing, pressure-like, or heavy Jimmye Norman, RN, Whitney 07/24/2020 9:36:14 AM Disp. Time Eilene Ghazi Time) Disposition Final User 07/24/2020 9:28:12 AM Send to Urgent Queue Donato Heinz 07/24/2020 9:40:19 AM 911 Outcome Documentation Jimmye Norman, RN, Loree Fee Reason: Caller states her husband is going to take her to the ED. Follow up call not necessary 07/24/2020 9:39:25 AM Call EMS 911 Now Yes Jimmye Norman, RN, Loree Fee Caller Disagree/Comply Disagree Caller Understands Yes PreDisposition InappropriateToAsk Care Advice Given Per Guideline CALL EMS 911 NOW: * Immediate medical attention is needed. You need to hang up and call 911 (or an ambulance). Referrals GO TO FACILITY OTHER - SPECIFY

## 2020-07-24 NOTE — ED Notes (Signed)
See triage note, pt reports starting zoloft last Friday and being dx with lyme disease. Started having chest pressure on Friday, stopped taking pills Monday, still having chest pressure. Denies N/V. +SHOB.  Pt speaking in complete, sentences, RR even and unlabored.

## 2020-07-24 NOTE — Telephone Encounter (Signed)
Transferred to Access Nurse  pt called she is having chest tightness and is having numbness and tingling in both arms when she is laying down

## 2020-07-24 NOTE — Telephone Encounter (Signed)
The patient is currently in the ED.

## 2020-07-24 NOTE — ED Notes (Signed)
EDP at bedside  

## 2020-07-24 NOTE — ED Provider Notes (Signed)
[facility] Emergency Department Provider Note   ____________________________________________   First MD Initiated Contact with Patient 07/24/20 1057     (approximate)  I have reviewed the triage vital signs and the nursing notes.   HISTORY  Chief Complaint Chest Pain    HPI Sarah Moran is a 39 y.o. female with a stated past medical history of anxiety, GERD, and hypothyroidism who presents for multiple symptoms that have been occurring over the last 8 months.  Patient states that she has had upper and lower extremity numbness that has been intermittent since January 2021.  Patient states that this numbness is worsened when she is laying down or not moving and resolves when she is up walking.  Patient also describes chest pressure that sometimes accompanies the bilateral upper extremity paresthesias.  Patient now states that over the past 3-4 days she has had intermittent nausea/vomiting/diarrhea that she attributes to starting Zoloft as she states that the symptoms have resolved after she stopped taking it.         Past Medical History:  Diagnosis Date  . Allergy   . Anal fissure   . Anxiety    no meds  . Chronic pelvic pain in female   . Family history of breast cancer    My Risk neg 5/18  . Family history of cervical cancer   . Family history of skin cancer   . Family history of throat cancer   . Frequent headaches   . Gallstone   . Gallstones   . Genetic testing of female 01/2017   My Risk/BRCA neg  . GERD (gastroesophageal reflux disease)    well controlled. Takes gingerroot when symptoms  . Hypothyroidism    s/p thyroidectomy - levothyroxine 75 mcg daily  . IBS (irritable bowel syndrome)    constipation - takes miralax daily  . Increased risk of breast cancer 01/2017   IBIS=18.8%/riskscore=21.9%  . Liver cyst 04/2018  . Migraine    takes excedrin. She has tried imitrex in the past   . Personal history of radiation therapy   .  Seizure ([place]) 1997    x 1-age 31-after surgery for scoliosis  . Thyroid cancer ([place]) 2007   s/p thyroidectomy    Patient Active Problem List   Diagnosis Date Noted  . Family history of skin cancer   . Family history of cervical cancer   . Family history of throat cancer   . ANA positive 11/06/2019  . Left elbow pain 11/06/2019  . Arthralgia 09/30/2019  . Allergic rhinitis 05/06/2019  . Hypocalcemia 12/12/2018  . Constipation   . Calculus of gallbladder without cholecystitis without obstruction   . Breast pain, right 08/16/2018  . Prolapse of female pelvic organs 08/02/2018  . Rectocele 08/02/2018  . H. pylori infection 06/19/2018  . Dyspepsia   . Muscle ache 04/23/2018  . Paresthesia 04/23/2018  . Anxiety and depression 04/23/2018  . Tension headache 04/23/2018  . Hyperlipidemia 05/02/2017  . Family history of breast cancer 12/15/2016  . Annual physical exam 04/01/2016  . History of thyroid cancer 06/27/2014  . Hypothyroidism 06/27/2014  . Migraine 09/23/2013    Past Surgical History:  Procedure Laterality Date  . ANAL RECTAL MANOMETRY N/A 10/12/2018   Procedure: ANO RECTAL MANOMETRY;  Surgeon: Mauri Pole, MD;  Location: [facility];  Service: Endoscopy;  Laterality: N/A;  . ANTERIOR AND POSTERIOR REPAIR WITH SACROSPINOUS FIXATION N/A 08/02/2018   Procedure: ANTERIOR AND POSTERIOR REPAIR;  Surgeon: Homero Fellers,  MD;  Location: [facility];  Service: Gynecology;  Laterality: N/A;  . BACK SURGERY  1997   scoliosis  . CHOLECYSTECTOMY N/A 09/21/2018   Procedure: LAPAROSCOPIC CHOLECYSTECTOMY;  Surgeon: Fredirick Maudlin, MD;  Location: [facility];  Service: General;  Laterality: N/A;  . COLONOSCOPY    . ESOPHAGOGASTRODUODENOSCOPY (EGD) WITH PROPOFOL N/A 06/12/2018   Procedure: ESOPHAGOGASTRODUODENOSCOPY (EGD) WITH PROPOFOL;  Surgeon: Lin Landsman, MD;  Location: [place];  Service: Gastroenterology;  Laterality: N/A;  . HERNIA REPAIR  1990   bilateral  inguinal  . PUBOVAGINAL SLING N/A 08/02/2018   Procedure: PUBO-VAGINAL SLING- TVT;  Surgeon: Homero Fellers, MD;  Location: [facility];  Service: Gynecology;  Laterality: N/A;  . THYROIDECTOMY  2007   thyroid cancer  . TOTAL ABDOMINAL HYSTERECTOMY  2008   dymenorrhea    Prior to Admission medications   Medication Sig Start Date End Date Taking? Authorizing Provider  Cholecalciferol (VITAMIN D3) 50 MCG (2000 UT) capsule Take 2,000 Units by mouth daily.    [provider]  [facility] Liver Oil CAPS Take 1 capsule by mouth at bedtime.     [provider]  diclofenac Sodium (VOLTAREN) 1 % GEL Apply 2 g topically 4 (four) times daily. 11/06/19   [provider]  doxycycline (VIBRAMYCIN) 50 MG capsule Take 2 capsules (100 mg total) by mouth 2 (two) times daily for 10 days. 07/24/20 08/03/20  Naaman Plummer, MD  fluticasone (FLONASE) 50 MCG/ACT nasal spray Place 2 sprays into both nostrils daily as needed.     [provider]  LINZESS 290 MCG CAPS capsule TAKE 1 CAPSULE (290 MCG TOTAL) BY MOUTH 2 (TWO) TIMES DAILY BEFORE A MEAL 01/27/20   Nandigam, Venia Minks, MD  montelukast (SINGULAIR) 10 MG tablet TAKE 1 TABLET BY MOUTH EVERYDAY AT BEDTIME 04/03/20   Burnard Hawthorne, FNP  Multiple Vitamin (MULTI-VITAMIN) tablet Take by mouth.    [provider]  sertraline (ZOLOFT) 50 MG tablet Take 1 tablet (50 mg total) by mouth daily. 07/16/20   Leone Haven, MD  SUMAtriptan (IMITREX) 50 MG tablet TAKE ONE TABLET BY MOUTH AT ONSET OF MIGRAINE - MAY REPEAT DOSE IN 2 HOURS IF HEADACHE PERSISTS 01/02/20   Einar Pheasant, MD  SYNTHROID 88 MCG tablet TAKE 1 TABLET (88 MCG TOTAL) BY MOUTH DAILY BEFORE BREAKFAST. 06/13/20   Renato Shin, MD    Allergies Darvon [propoxyphene], Onion, Percocet [oxycodone-acetaminophen], and Vicodin [hydrocodone-acetaminophen]  Family History  Problem Relation Age of Onset  . Hyperlipidemia Father   . Hypertension Father   .  Clotting disorder Sister   . Hepatitis B Brother   . Breast cancer Paternal Grandmother 54       with mets  . Cervical cancer Mother        dx. in her early 75s  . Breast cancer Paternal Aunt 25  . Throat cancer Paternal Aunt 52  . Breast cancer Paternal Aunt 78  . Breast cancer Paternal Aunt        79  . Skin cancer Paternal Aunt   . Cancer Cousin        unknown type dx. in her late 20s/early 80s  . Skin cancer Cousin        13 cancerous moles removed in her 23s    Social History Social History   Tobacco Use  . Smoking status: Never Smoker  . Smokeless tobacco: Never Used  Vaping Use  . Vaping Use: Never used  Substance Use Topics  .  Alcohol use: Yes  . Drug use: No    Review of Systems Constitutional: No fever/chills Eyes: No visual changes. ENT: No sore throat. Cardiovascular: Endorses chest pain. Respiratory: Denies shortness of breath. Gastrointestinal: No abdominal pain.  No nausea, no vomiting.  No diarrhea. Genitourinary: Negative for dysuria. Musculoskeletal: Negative for acute arthralgias Skin: Negative for rash. Neurological: Negative for headaches Psychiatric: Negative for suicidal ideation/homicidal ideation   ____________________________________________   PHYSICAL EXAM:  VITAL SIGNS: ED Triage Vitals  Enc Vitals Group     BP 07/24/20 1052 107/74     Pulse Rate 07/24/20 1052 75     Resp 07/24/20 1052 18     Temp 07/24/20 1052 98.3 F (36.8 C)     Temp Source 07/24/20 1052 Oral     SpO2 07/24/20 1052 99 %     Weight 07/24/20 1053 130 lb (59 kg)     Height 07/24/20 1053 5' (1.524 m)     Head Circumference --      Peak Flow --      Pain Score 07/24/20 1053 0     Pain Loc --      Pain Edu? --      Excl. in [place]? --    Constitutional: Alert and oriented. Well appearing and in no acute distress. Eyes: Conjunctivae are normal. PERRL. Head: Atraumatic. Nose: No congestion/rhinnorhea. Mouth/Throat: Mucous membranes are moist. Neck: No  stridor Cardiovascular: Grossly normal heart sounds.  Good peripheral circulation. Respiratory: Normal respiratory effort.  No retractions. Gastrointestinal: Soft and nontender. No distention. Musculoskeletal: No obvious deformities Neurologic:  Normal speech and language.  Subjective numbness in bilateral upper extremities Skin:  Skin is warm and dry. No rash noted. Psychiatric: Mood and affect are normal. Speech and behavior are normal.  ____________________________________________   LABS (all labs ordered are listed, but only abnormal results are displayed)  Labs Reviewed  COMPREHENSIVE METABOLIC PANEL - Abnormal; Notable for the following components:      Result Value   Glucose, Bld 101 (*)    All other components within normal limits  URINALYSIS, ROUTINE W REFLEX MICROSCOPIC - Abnormal; Notable for the following components:   Color, Urine STRAW (*)    APPearance CLEAR (*)    Specific Gravity, Urine 1.004 (*)    Hgb urine dipstick MODERATE (*)    Ketones, ur 5 (*)    Bacteria, UA RARE (*)    All other components within normal limits  T4, FREE - Abnormal; Notable for the following components:   Free T4 1.47 (*)    All other components within normal limits  CBC  B. BURGDORFI ANTIBODIES  POC URINE PREG, ED  CBG MONITORING, ED  TROPONIN I (HIGH SENSITIVITY)  TROPONIN I (HIGH SENSITIVITY)   ____________________________________________  EKG  ED ECG REPORT I, Naaman Plummer, the attending physician, personally viewed and interpreted this ECG.  Date: 07/24/2020 EKG Time: 1050 Rate: 79 Rhythm: normal sinus rhythm QRS Axis: normal Intervals: normal ST/T Wave abnormalities: normal Narrative Interpretation: no evidence of acute ischemia  ____________________________________________  RADIOLOGY  ED MD interpretation:    One-view portable x-ray of the chest shows no evidence of acute abnormalities including no pneumonia, pneumothorax, or widened mediastinum.  CT  without contrast of the head shows no evidence of acute intracranial abnormalities including no acute fractures or intracranial hemorrhage  Official radiology report(s): CT Head Wo Contrast  Result Date: 07/24/2020 CLINICAL DATA:  Neuro deficit, acute, stroke suspected. Additional history provided: Patient reports chest pressure with tingling  in bilateral arms since last Friday. EXAM: CT HEAD WITHOUT CONTRAST TECHNIQUE: Contiguous axial images were obtained from the base of the skull through the vertex without intravenous contrast. COMPARISON:  No pertinent prior exams are available for comparison. FINDINGS: Brain: Cerebral volume is normal. There is no acute intracranial hemorrhage. No demarcated cortical infarct. No extra-axial fluid collection. No evidence of intracranial mass. No midline shift. Vascular: No hyperdense vessel. Skull: Normal. Negative for fracture or focal lesion. Sinuses/Orbits: Visualized orbits show no acute finding. No significant paranasal sinus disease at the imaged levels. IMPRESSION: No evidence of acute intracranial abnormality. Electronically Signed   By: Kellie Simmering DO   On: 07/24/2020 11:48   DG Chest Portable 1 View  Result Date: 07/24/2020 CLINICAL DATA:  Chest pain. EXAM: PORTABLE CHEST 1 VIEW COMPARISON:  None. FINDINGS: The heart size and mediastinal contours are within normal limits. Both lungs are clear. No visible pleural effusions or pneumothorax. Thoracic fusion hardware. IMPRESSION: No acute cardiopulmonary disease. Electronically Signed   By: Margaretha Sheffield MD   On: 07/24/2020 11:14    ____________________________________________   PROCEDURES  Procedure(s) performed (including Critical Care):  .1-3 Lead EKG Interpretation Performed by: Naaman Plummer, MD Authorized by: Naaman Plummer, MD     Interpretation: normal     ECG rate:  78   ECG rate assessment: normal     Rhythm: sinus rhythm     Ectopy: none     Conduction: normal        ____________________________________________   INITIAL IMPRESSION / ASSESSMENT AND PLAN / ED COURSE  As part of my medical decision making, I reviewed the following data within the [place] notes reviewed and incorporated, Labs reviewed, EKG interpreted, Old chart reviewed, Radiograph reviewed and Notes from prior ED visits reviewed and incorporated        Workup: ECG, CXR, CBC, BMP, Troponin Findings: ECG: No overt evidence of STEMI. No evidence of Brugadas sign, delta wave, epsilon wave, significantly prolonged QTc, or malignant arrhythmia HS Troponin: Negative x1 Other Labs unremarkable for emergent problems. CXR: Without PTX, PNA, or widened mediastinum Last Stress Test: Never Last Heart Catheterization: Never HEART Score: 2 Patient does have laboratory evidence of hyperthyroidism at 1.47.  Discussed with patient that this may be due to what ever herbal supplements she was taking interacting with her supplemental thyroid medications.  Patient was encouraged to stop all of these herbal supplements and to wait for a recheck of her thyroid level before decreasing her levothyroxine.  Patient was in agreement Given History, Exam, and Workup I have low suspicion for ACS, Pneumothorax, Pneumonia, Pulmonary Embolus, Tamponade, Aortic Dissection or other emergent problem as a cause for this presentation.   Reassesment: Prior to discharge patients pain was controlled and they were well appearing.  Disposition:  Discharge. Strict return precautions discussed with patient with full understanding. Advised patient to follow up promptly with primary care provider      ____________________________________________   FINAL CLINICAL IMPRESSION(S) / ED DIAGNOSES  Final diagnoses:  Palpitations  Paresthesia of upper extremity  Nausea vomiting and diarrhea  Hyperthyroidism     ED Discharge Orders         Ordered    doxycycline (VIBRAMYCIN) 50 MG  capsule  2 times daily        07/24/20 1403           Note:  This document was prepared using Dragon voice recognition software and may include unintentional dictation errors.  Naaman Plummer, MD 07/24/20 1416

## 2020-07-24 NOTE — ED Triage Notes (Signed)
Pt c/o chest pressure with tingling in BL arm since last Friday had been started on a new herbal supplement and Zoloft but has stopped both but continues to have the chest pressure.

## 2020-07-24 NOTE — Telephone Encounter (Addendum)
Patient was instructed by [facility] to call 911/ go to ED.

## 2020-07-26 ENCOUNTER — Other Ambulatory Visit: Payer: Self-pay | Admitting: Internal Medicine

## 2020-07-27 LAB — B. BURGDORFI ANTIBODIES: B burgdorferi Ab IgG+IgM: <0.91 {ISR} (ref 0.00–0.90)

## 2020-07-31 ENCOUNTER — Other Ambulatory Visit: Payer: Self-pay

## 2020-07-31 ENCOUNTER — Ambulatory Visit: Payer: BC Managed Care – PPO | Admitting: Family Medicine

## 2020-07-31 ENCOUNTER — Encounter: Payer: Self-pay | Admitting: Family Medicine

## 2020-07-31 VITALS — BP 90/60 | HR 83 | Temp 98.4°F | Ht 60.0 in | Wt 132.8 lb

## 2020-07-31 DIAGNOSIS — M2550 Pain in unspecified joint: Secondary | ICD-10-CM | POA: Diagnosis not present

## 2020-07-31 DIAGNOSIS — F32A Depression, unspecified: Secondary | ICD-10-CM

## 2020-07-31 DIAGNOSIS — F419 Anxiety disorder, unspecified: Secondary | ICD-10-CM | POA: Diagnosis not present

## 2020-07-31 DIAGNOSIS — R768 Other specified abnormal immunological findings in serum: Secondary | ICD-10-CM | POA: Diagnosis not present

## 2020-07-31 DIAGNOSIS — E039 Hypothyroidism, unspecified: Secondary | ICD-10-CM | POA: Diagnosis not present

## 2020-07-31 DIAGNOSIS — R7689 Other specified abnormal immunological findings in serum: Secondary | ICD-10-CM

## 2020-07-31 DIAGNOSIS — R202 Paresthesia of skin: Secondary | ICD-10-CM

## 2020-07-31 DIAGNOSIS — T887XXA Unspecified adverse effect of drug or medicament, initial encounter: Secondary | ICD-10-CM

## 2020-07-31 LAB — TSH: TSH: 0.64 u[IU]/mL (ref 0.35–4.50)

## 2020-07-31 LAB — T4, FREE: Free T4: 1.53 ng/dL (ref 0.60–1.60)

## 2020-07-31 MED ORDER — ESCITALOPRAM OXALATE 5 MG PO TABS: 5.0000 mg | ORAL_TABLET | Freq: Every day | ORAL | 1 refills | Status: DC

## 2020-07-31 NOTE — Assessment & Plan Note (Signed)
Working up with additional lab work.  If needed she can follow-up with rheumatology again.

## 2020-07-31 NOTE — Assessment & Plan Note (Signed)
Patient with likely side effect of Zoloft.  The herbal supplement could also have been contributing.  Symptoms have resolved since discontinuing those 2 things.  Monitor for recurrence with starting Lexapro.

## 2020-07-31 NOTE — Progress Notes (Signed)
Tommi Rumps, MD Phone: [phone]  Sarah Moran is a 39 y.o. female who presents today for follow-up.  Positive ANA: Patient notes dry eyes and dry mouth.  Also has scattered joint pains at times.  She saw rheumatology previously and they recommended no additional work-up at the time and said to follow-up in 1 year.  Upper extremity tingling: Patient notes this got worse when she was taking an herbal supplement.  Has returned back to her baseline since discontinuing the herbal supplement.  Symptoms occur only when laying down and resolve quickly with repositioning.  She saw neurology previously and they offered nerve conduction studies though patient deferred this at the time as the neurologists suspicion was low for central or peripheral nerve issues.  Medication side effect: Patient started on Zoloft and an herbal supplement from an herb lady and noted onset of nausea, vomiting, and diarrhea after that.  She also had increased joint pain.  She also noted heart racing.  She stopped Zoloft and the symptoms of nausea vomiting and diarrhea resolved though as she continued on the herbal supplement her heart rate increased.  She was evaluated in the emergency department and had negative troponins and negative EKG.  Her free T4 was elevated.  This was thought to be related to the herbal supplement and she was advised to discontinue that.  During this time the pins-and-needles that she has in her arms increased and she was having left chest burning.  Since stopping the herbal supplement she has had no additional chest discomfort.  The nausea and vomiting has resolved.  Her pins-and-needles have returned to their baseline.  She is no longer on Zoloft.  Anxiety: Continues to be an issue.  She wonders what the next step would be.  No depression.  Arthralgias: Patient notes the pain in her shoulder subsided while she was on the herbal supplement.  It has come back though it is not nearly as bad.  She  is trying to avoid certain foods to see if that helps.  She is cut down on sweets and chips.  She stopped coffee.  Social History   Tobacco Use  Smoking Status Never Smoker  Smokeless Tobacco Never Used     ROS see history of present illness  Objective  Physical Exam Vitals:   07/31/20 0906  BP: 90/60  Pulse: 83  Temp: 98.4 F (36.9 C)  SpO2: 98%    BP Readings from Last 3 Encounters:  07/31/20 90/60  07/24/20 111/76  05/22/20 101/69   Wt Readings from Last 3 Encounters:  07/31/20 132 lb 12.8 oz (60.2 kg)  07/24/20 130 lb (59 kg)  06/29/20 137 lb (62.1 kg)    Physical Exam Constitutional:      General: She is not in acute distress.    Appearance: She is not diaphoretic.  Cardiovascular:     Rate and Rhythm: Normal rate and regular rhythm.     Heart sounds: Normal heart sounds.  Pulmonary:     Effort: Pulmonary effort is normal.     Breath sounds: Normal breath sounds.  Musculoskeletal:     Comments: Slight soreness on the medial aspect of her right scapula, no discomfort on passive range of motion of her right shoulder  Skin:    General: Skin is warm and dry.  Neurological:     Mental Status: She is alert.      Assessment/Plan: Please see individual problem list.  Problem List Items Addressed This Visit    ANA  positive    Check additional lab work as outlined below.  Certainly Sjogren's could account for some of her symptoms.      Anxiety and depression    No depression currently.  Does have some anxiety.  Will start on Lexapro 5 mg daily.  If she develops nausea, vomiting, diarrhea, sexual dysfunction, worsening anxiety, or depression when starting this she will let us know.      Relevant Medications   escitalopram (LEXAPRO) 5 MG tablet   Arthralgia    Working up with additional lab work.  If needed she can follow-up with rheumatology again.      Hypothyroidism    T4 was likely elevated related to the herbal supplement she was taking.  She has  discontinued that.  We will check a TSH and free T4 today.      Relevant Orders   TSH   T4, free   Medication side effect    Patient with likely side effect of Zoloft.  The herbal supplement could also have been contributing.  Symptoms have resolved since discontinuing those 2 things.  Monitor for recurrence with starting Lexapro.      Paresthesia    Chronic issue.  Stable.  Has been evaluated by neurology.  She will monitor for any worsening symptoms.       Other Visit Diagnoses    Positive ANA (antinuclear antibody)    -  Primary   Relevant Orders   Sjogrens syndrome-A extractable nuclear antibody   Sjogrens syndrome-B extractable nuclear antibody   Anti-DNA antibody, double-stranded   Anti-Smith antibody       This visit occurred during the SARS-CoV-2 public health emergency.  Safety protocols were in place, including screening questions prior to the visit, additional usage of staff PPE, and extensive cleaning of exam room while observing appropriate contact time as indicated for disinfecting solutions.    Tommi Rumps, MD [place]

## 2020-07-31 NOTE — Assessment & Plan Note (Signed)
No depression currently.  Does have some anxiety.  Will start on Lexapro 5 mg daily.  If she develops nausea, vomiting, diarrhea, sexual dysfunction, worsening anxiety, or depression when starting this she will let us know.

## 2020-07-31 NOTE — Assessment & Plan Note (Signed)
T4 was likely elevated related to the herbal supplement she was taking.  She has discontinued that.  We will check a TSH and free T4 today.

## 2020-07-31 NOTE — Assessment & Plan Note (Signed)
Chronic issue.  Stable.  Has been evaluated by neurology.  She will monitor for any worsening symptoms.

## 2020-07-31 NOTE — Assessment & Plan Note (Signed)
Check additional lab work as outlined below.  Certainly Sjogren's could account for some of her symptoms.

## 2020-07-31 NOTE — Patient Instructions (Signed)
Nice to see you. We will check lab work today. I sent Lexapro in for you. If you have any of the side effects we discussed please let me know.

## 2020-08-03 LAB — SJOGRENS SYNDROME-B EXTRACTABLE NUCLEAR ANTIBODY: SSB (La) (ENA) Antibody, IgG: <1 AI

## 2020-08-03 LAB — ANTI-DNA ANTIBODY, DOUBLE-STRANDED: ds DNA Ab: <1 IU/mL

## 2020-08-03 LAB — ANTI-SMITH ANTIBODY: ENA SM Ab Ser-aCnc: <1 AI

## 2020-08-03 LAB — SJOGRENS SYNDROME-A EXTRACTABLE NUCLEAR ANTIBODY: SSA (Ro) (ENA) Antibody, IgG: <1 AI

## 2020-08-05 MED ORDER — FLUCONAZOLE 150 MG PO TABS: 150.0000 mg | ORAL_TABLET | Freq: Once | ORAL | 0 refills | Status: AC

## 2020-08-06 ENCOUNTER — Encounter: Payer: Self-pay | Admitting: Family Medicine

## 2020-08-06 ENCOUNTER — Telehealth: Payer: Self-pay

## 2020-08-06 ENCOUNTER — Other Ambulatory Visit (INDEPENDENT_AMBULATORY_CARE_PROVIDER_SITE_OTHER): Payer: BC Managed Care – PPO

## 2020-08-06 DIAGNOSIS — R197 Diarrhea, unspecified: Secondary | ICD-10-CM | POA: Diagnosis not present

## 2020-08-06 NOTE — Telephone Encounter (Signed)
COVID test ordered.

## 2020-08-06 NOTE — Telephone Encounter (Signed)
I think it is reasonable to hold the lexapro until she is feeling better and then reintroduce it to see if that is the cause of the symptoms. Some of her symptoms could be from the vaccine, though it sounded as though she had the symptoms prior to the vaccine and prior to starting the lexapro. With the diarrhea she could get tested for COVID to rule that out as a cause as diarrhea can be seen with COVID infections. She should stay quarantined at home at least until she gets the covid test result back. I'm not sure we need to check for celiac with these being acute symptoms.

## 2020-08-06 NOTE — Telephone Encounter (Signed)
I called the patient and informed her of the message from the provider and she would like  to be tested for covid and she is scheduled today at 2: 30 pm. Can you order  Sarah Moran,cma

## 2020-08-06 NOTE — Telephone Encounter (Signed)
See phone note

## 2020-08-08 LAB — SARS-COV-2, NAA 2 DAY TAT

## 2020-08-08 LAB — NOVEL CORONAVIRUS, NAA: SARS-CoV-2, NAA: NOT DETECTED

## 2020-09-02 ENCOUNTER — Other Ambulatory Visit: Payer: Self-pay | Admitting: Endocrinology

## 2020-09-03 ENCOUNTER — Other Ambulatory Visit: Payer: Self-pay | Admitting: Family Medicine

## 2020-09-03 ENCOUNTER — Telehealth: Payer: Self-pay | Admitting: Family Medicine

## 2020-09-03 DIAGNOSIS — R229 Localized swelling, mass and lump, unspecified: Secondary | ICD-10-CM

## 2020-09-03 NOTE — Telephone Encounter (Signed)
lft vm for pt to call ofc to sch US 

## 2020-09-04 ENCOUNTER — Telehealth: Payer: Self-pay | Admitting: Family Medicine

## 2020-09-04 NOTE — Telephone Encounter (Signed)
Patient was returning call about ultrasound appt

## 2020-09-07 ENCOUNTER — Telehealth: Payer: Self-pay | Admitting: Family Medicine

## 2020-09-07 ENCOUNTER — Encounter: Payer: Self-pay | Admitting: Family Medicine

## 2020-09-07 NOTE — Telephone Encounter (Signed)
Patient called in about referral for mammmogram

## 2020-09-14 ENCOUNTER — Telehealth: Payer: Self-pay | Admitting: Family Medicine

## 2020-09-14 ENCOUNTER — Ambulatory Visit: Payer: BC Managed Care – PPO | Admitting: Family Medicine

## 2020-09-14 NOTE — Telephone Encounter (Signed)
Patient was returning call for referral 

## 2020-09-15 ENCOUNTER — Other Ambulatory Visit (HOSPITAL_COMMUNITY): Payer: Self-pay | Admitting: Family Medicine

## 2020-09-15 ENCOUNTER — Other Ambulatory Visit: Payer: Self-pay

## 2020-09-15 DIAGNOSIS — N63 Unspecified lump in unspecified breast: Secondary | ICD-10-CM

## 2020-09-15 NOTE — Telephone Encounter (Signed)
lft vm for pt to call [phone] option 3 and then 2 to sch US. 

## 2020-09-17 ENCOUNTER — Ambulatory Visit: Payer: BC Managed Care – PPO | Admitting: Family Medicine

## 2020-09-22 ENCOUNTER — Other Ambulatory Visit: Payer: Self-pay

## 2020-09-23 ENCOUNTER — Inpatient Hospital Stay (HOSPITAL_COMMUNITY): Admission: RE | Admit: 2020-09-23 | Payer: BC Managed Care – PPO | Source: Ambulatory Visit

## 2020-09-24 ENCOUNTER — Encounter: Payer: Self-pay | Admitting: Family Medicine

## 2020-09-24 ENCOUNTER — Ambulatory Visit: Payer: BC Managed Care – PPO | Admitting: Family Medicine

## 2020-09-24 ENCOUNTER — Other Ambulatory Visit: Payer: Self-pay

## 2020-09-24 DIAGNOSIS — E039 Hypothyroidism, unspecified: Secondary | ICD-10-CM

## 2020-09-24 DIAGNOSIS — R202 Paresthesia of skin: Secondary | ICD-10-CM | POA: Diagnosis not present

## 2020-09-24 DIAGNOSIS — F32A Depression, unspecified: Secondary | ICD-10-CM | POA: Diagnosis not present

## 2020-09-24 DIAGNOSIS — R229 Localized swelling, mass and lump, unspecified: Secondary | ICD-10-CM | POA: Insufficient documentation

## 2020-09-24 DIAGNOSIS — F419 Anxiety disorder, unspecified: Secondary | ICD-10-CM | POA: Diagnosis not present

## 2020-09-24 LAB — COMPREHENSIVE METABOLIC PANEL
ALT: 9 U/L (ref 0–35)
AST: 15 U/L (ref 0–37)
Albumin: 4.5 g/dL (ref 3.5–5.2)
Alkaline Phosphatase: 61 U/L (ref 39–117)
BUN: 5 mg/dL — ABNORMAL LOW (ref 6–23)
CO2: 29 mEq/L (ref 19–32)
Calcium: 9.6 mg/dL (ref 8.4–10.5)
Chloride: 105 mEq/L (ref 96–112)
Creatinine, Ser: 0.67 mg/dL (ref 0.40–1.20)
GFR: 108.54 mL/min (ref >60.00)
Glucose, Bld: 102 mg/dL — ABNORMAL HIGH (ref 70–99)
Potassium: 4.1 mEq/L (ref 3.5–5.1)
Sodium: 140 mEq/L (ref 135–145)
Total Bilirubin: 0.5 mg/dL (ref 0.2–1.2)
Total Protein: 6.8 g/dL (ref 6.0–8.3)

## 2020-09-24 LAB — CBC
HCT: 38.8 % (ref 36.0–46.0)
Hemoglobin: 13.2 g/dL (ref 12.0–15.0)
MCHC: 34.1 g/dL (ref 30.0–36.0)
MCV: 89.1 fl (ref 78.0–100.0)
Platelets: 333 10*3/uL (ref 150.0–400.0)
RBC: 4.35 Mil/uL (ref 3.87–5.11)
RDW: 12.8 % (ref 11.5–15.5)
WBC: 6.2 10*3/uL (ref 4.0–10.5)

## 2020-09-24 LAB — TSH: TSH: 0.16 u[IU]/mL — ABNORMAL LOW (ref 0.35–4.50)

## 2020-09-24 LAB — VITAMIN B12: Vitamin B-12: 254 pg/mL (ref 211–911)

## 2020-09-24 NOTE — Assessment & Plan Note (Signed)
Patient with continued issues with tingling sensation in all of her extremities.  She also reports a vibratory sensation on the inside of her body.  She was evaluated in the emergency department for some of the symptoms previously and work-up was largely negative with the exception of an elevated free T4.  Plan on checking labs as outlined.  We are also going to refer her to a new neurologist to get a second opinion.

## 2020-09-24 NOTE — Progress Notes (Signed)
Tommi Rumps, MD Phone: [phone]  Sarah Moran is a 40 y.o. female who presents today for follow-up.  Anxiety: Patient notes mild anxiety though every time she feels anxious she does a questionnaire and notes she is average for anxiety for anxiety.  She did not tolerate the Lexapro as it caused significant GI issues.  Those lasted about a week.  GAD 7 : Generalized Anxiety Score 09/24/2020  Nervous, Anxious, on Edge 1  Control/stop worrying 0  Worry too much - different things 2  Trouble relaxing 0  Restless 1  Easily annoyed or irritable 1  Afraid - awful might happen 1  Total GAD 7 Score 6   Depression screen [facility] 2/9 09/24/2020 06/29/2020 01/28/2020 06/19/2019 05/06/2019  Decreased Interest 0 0 0 0 0  Down, Depressed, Hopeless 0 0 0 0 0  PHQ - 2 Score 0 0 0 0 0  Altered sleeping 2 - - - -  Tired, decreased energy 1 - - - -  Change in appetite 0 - - - -  Feeling bad or failure about yourself  0 - - - -  Trouble concentrating 2 - - - -  Moving slowly or fidgety/restless 0 - - - -  Suicidal thoughts 0 - - - -  PHQ-9 Score 5 - - - -  Difficult doing work/chores Somewhat difficult - - - -     Tingling/vibration sensation: Patient notes ongoing issues with feeling as though she has tingling and pins-and-needles in her legs and arms bilaterally.  Oftentimes is worse after exercising.  She does note some occasional tremors in her extremities after exercising.  She also reports a sensation that she is vibrating on the inside.  Notes that was worsened after taking Tylenol PM 1 night and Benadryl the next night though she feels this way frequently.  She does note occasionally feeling as if she sees something on the ceiling when she wakes up though it resolves very quickly.  She has discontinued caffeinated coffee and alcohol.  She does report a family history of MS.  She did previously see neurology though they plan for no further work-up at that time.  Axillary nodule: This is a  chronic issue.  Its been there for 2 years.  She is scheduled for mammogram and ultrasound.  Social History   Tobacco Use  Smoking Status Never Smoker  Smokeless Tobacco Never Used    Current Outpatient Medications on File Prior to Visit  Medication Sig Dispense Refill  . Cholecalciferol (VITAMIN D3) 50 MCG (2000 UT) capsule Take 2,000 Units by mouth daily.    Marland Kitchen Cod Liver Oil CAPS Take 1 capsule by mouth at bedtime.     . diclofenac Sodium (VOLTAREN) 1 % GEL Apply 2 g topically 4 (four) times daily.    Marland Kitchen escitalopram (LEXAPRO) 5 MG tablet Take 1 tablet (5 mg total) by mouth daily. 90 tablet 1  . fluticasone (FLONASE) 50 MCG/ACT nasal spray Place 2 sprays into both nostrils daily as needed.     Marland Kitchen LINZESS 290 MCG CAPS capsule TAKE 1 CAPSULE (290 MCG TOTAL) BY MOUTH 2 (TWO) TIMES DAILY BEFORE A MEAL 180 capsule 3  . montelukast (SINGULAIR) 10 MG tablet TAKE 1 TABLET BY MOUTH EVERYDAY AT BEDTIME 90 tablet 1  . Multiple Vitamin (MULTI-VITAMIN) tablet Take by mouth.    . SUMAtriptan (IMITREX) 50 MG tablet TAKE ONE TABLET BY MOUTH AT ONSET OF MIGRAINE - MAY REPEAT DOSE IN 2 HOURS IF HEADACHE PERSISTS 10 tablet  2  . SYNTHROID 88 MCG tablet TAKE 1 TABLET (88 MCG TOTAL) BY MOUTH DAILY BEFORE BREAKFAST. 90 tablet 0   No current facility-administered medications on file prior to visit.     ROS see history of present illness  Objective  Physical Exam Vitals:   09/24/20 0826  BP: 100/70  Pulse: 83  Temp: 98.2 F (36.8 C)  SpO2: 99%    BP Readings from Last 3 Encounters:  09/24/20 100/70  07/31/20 90/60  07/24/20 111/76   Wt Readings from Last 3 Encounters:  09/24/20 129 lb 3.2 oz (58.6 kg)  07/31/20 132 lb 12.8 oz (60.2 kg)  07/24/20 130 lb (59 kg)    Physical Exam Constitutional:      General: She is not in acute distress.    Appearance: She is not diaphoretic.  Cardiovascular:     Rate and Rhythm: Normal rate and regular rhythm.     Heart sounds: Normal heart sounds.   Pulmonary:     Effort: Pulmonary effort is normal.     Breath sounds: Normal breath sounds.  Musculoskeletal:        General: No edema.     Right lower leg: No edema.     Left lower leg: No edema.  Skin:    General: Skin is warm and dry.     Comments: Slight fullness in her right armpit with no underlying palpable nodule, no abnormality in left armpit  Neurological:     Mental Status: She is alert.      Assessment/Plan: Please see individual problem list.  Problem List Items Addressed This Visit    Anxiety and depression    No significant anxiety at this time.  She will remain off of medication given intolerance.      Paresthesia    Patient with continued issues with tingling sensation in all of her extremities.  She also reports a vibratory sensation on the inside of her body.  She was evaluated in the emergency department for some of the symptoms previously and work-up was largely negative with the exception of an elevated free T4.  Plan on checking labs as outlined.  We are also going to refer her to a new neurologist to get a second opinion.      Relevant Orders   CBC   TSH   B12   Comp Met (CMET)   Ambulatory referral to Neurology   Subcutaneous nodule    She will proceed with mammogram and ultrasound.         This visit occurred during the SARS-CoV-2 public health emergency.  Safety protocols were in place, including screening questions prior to the visit, additional usage of staff PPE, and extensive cleaning of exam room while observing appropriate contact time as indicated for disinfecting solutions.   I have spent 32 minutes in the care of this patient regarding history taking, documentation, physical exam, placing orders, discussion of plan.   Tommi Rumps, MD [place]

## 2020-09-24 NOTE — Assessment & Plan Note (Signed)
She will proceed with mammogram and ultrasound.

## 2020-09-24 NOTE — Patient Instructions (Signed)
Nice to see you. We will get lab work today. We can refer you to neurology for further evaluation as well.

## 2020-09-24 NOTE — Assessment & Plan Note (Signed)
No significant anxiety at this time.  She will remain off of medication given intolerance.

## 2020-09-25 MED ORDER — SYNTHROID 75 MCG PO TABS: 75.0000 ug | ORAL_TABLET | Freq: Every day | ORAL | 1 refills | Status: DC

## 2020-10-01 ENCOUNTER — Ambulatory Visit (HOSPITAL_COMMUNITY)
Admission: RE | Admit: 2020-10-01 | Discharge: 2020-10-01 | Disposition: A | Discharge status: Home / Self Care | Payer: BC Managed Care – PPO | Source: Ambulatory Visit | Attending: Family Medicine | Admitting: Family Medicine

## 2020-10-01 DIAGNOSIS — N63 Unspecified lump in unspecified breast: Secondary | ICD-10-CM | POA: Diagnosis not present

## 2020-10-01 DIAGNOSIS — N6002 Solitary cyst of left breast: Secondary | ICD-10-CM | POA: Diagnosis not present

## 2020-10-01 IMAGING — US US BREAST*R* LIMITED INC AXILLA
1 series · 8 of 8 positions shown · non-contrast
Comparison: Previous exam(s).

CLINICAL DATA: Palpable abnormality in the RIGHT axilla.

EXAM:
DIGITAL DIAGNOSTIC BILATERAL MAMMOGRAM WITH CAD AND TOMO
ULTRASOUND BILATERAL BREAST

[Series 1: us breast*right* limited inc axilla · 0.07mm/px · 8 of 8 slices shown]
[im 1/8]
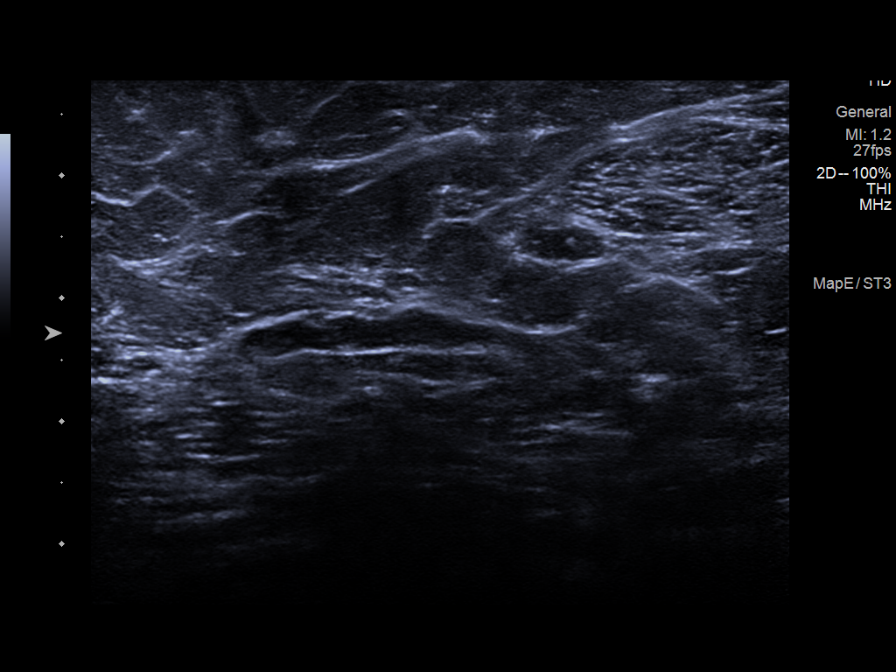
[im 2/8]
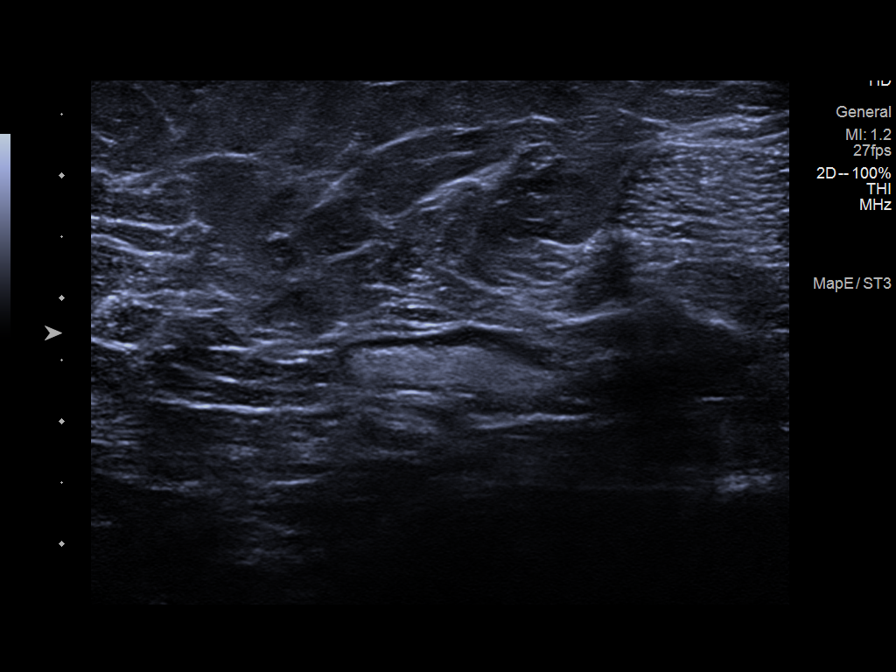
[im 3/8]
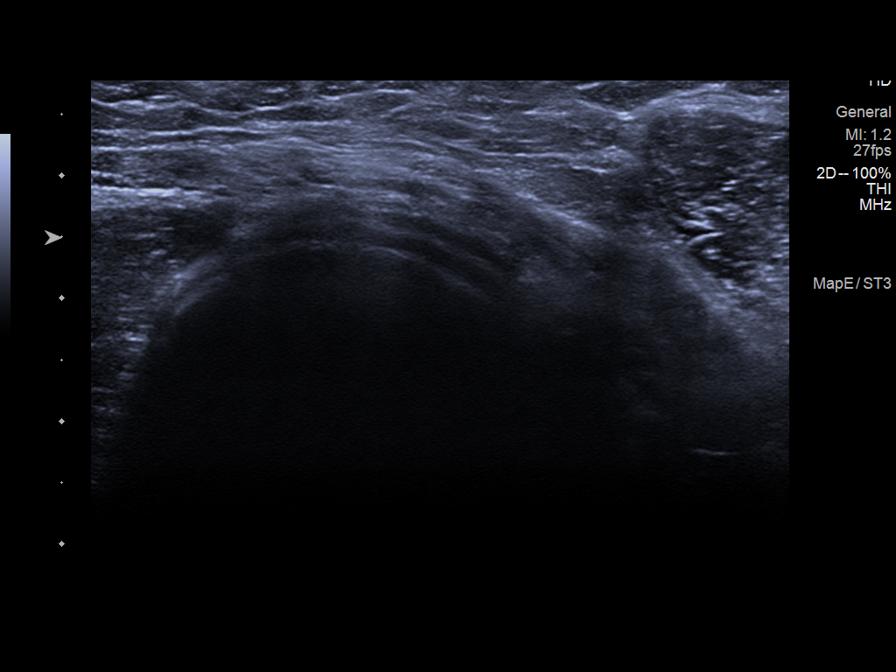
[im 4/8]
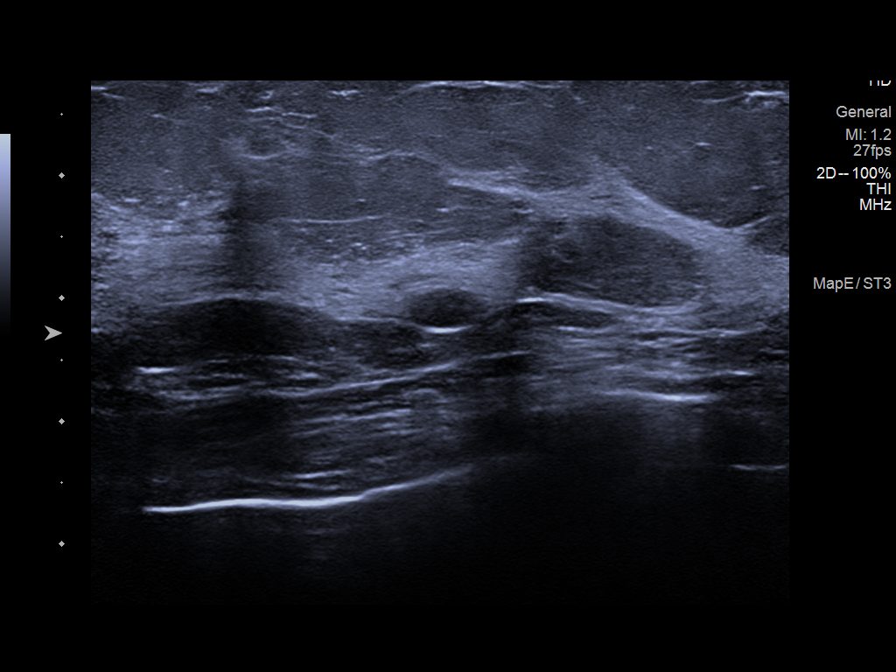
[im 5/8]
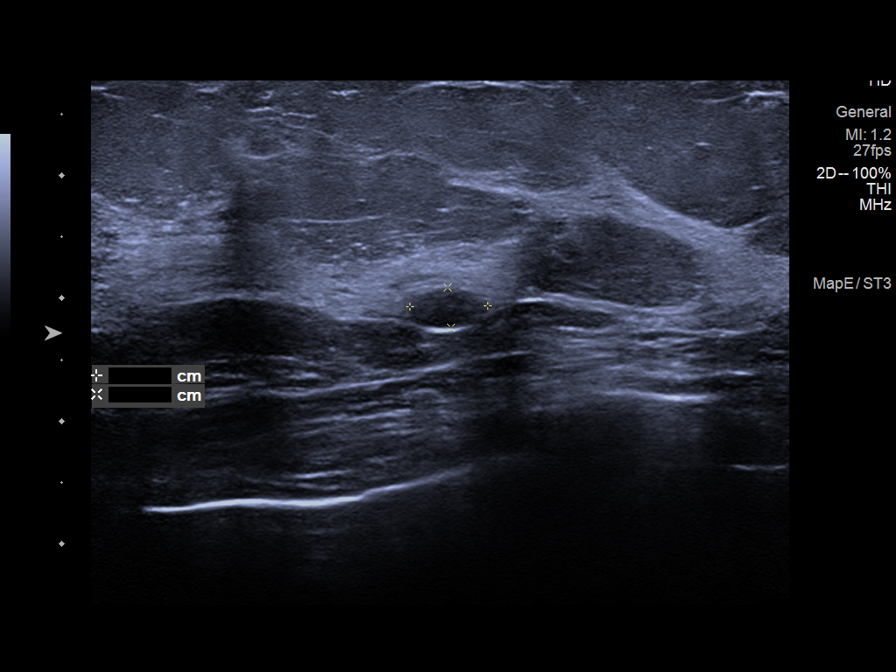
[im 6/8]
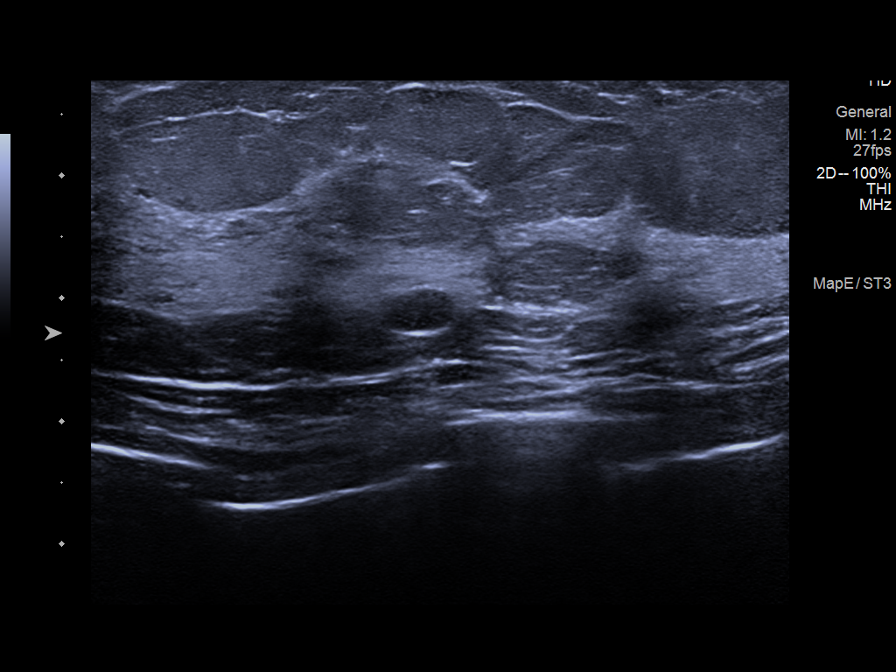
[im 7/8]
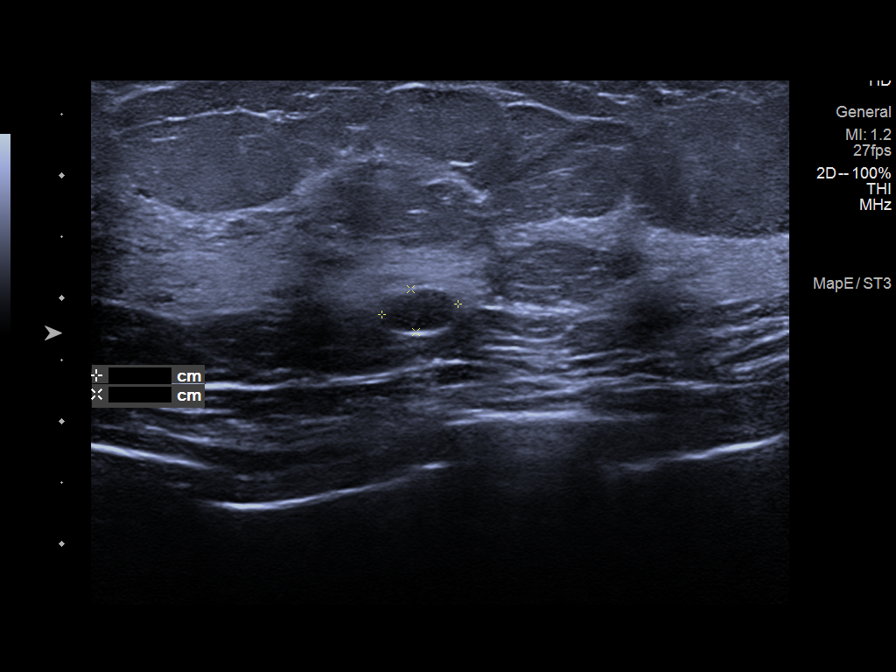
[im 8/8]
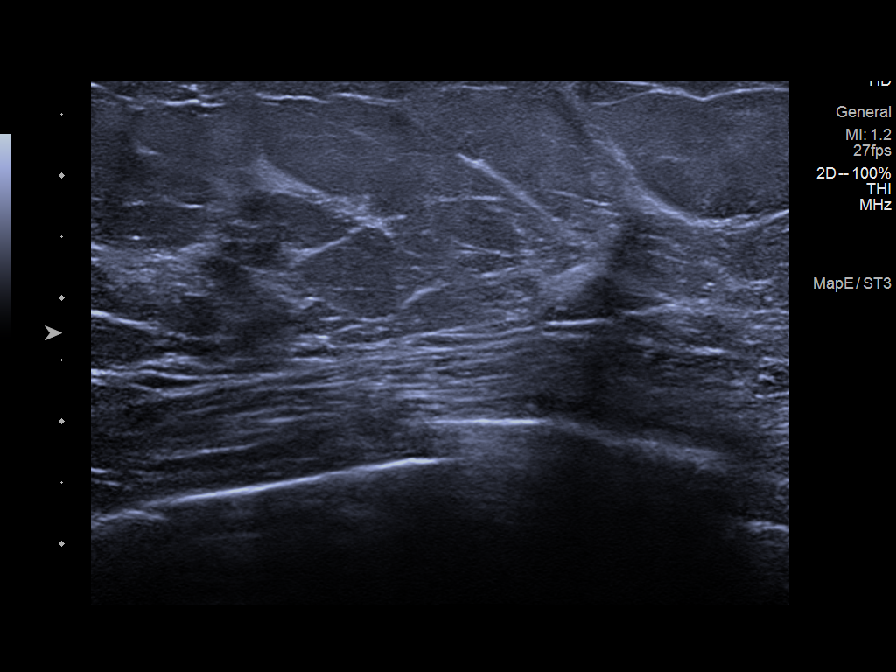

[8 of 8 positions shown; findings below may reference images not displayed]

ACR Breast Density Category c: The breast tissue is heterogeneously
dense, which may obscure small masses.
FINDINGS: RIGHT BREAST:

Mammogram: No suspicious mass, distortion, or microcalcifications
are identified to suggest presence of malignancy. Spot tangential
view of the RIGHT axillary region shows normal appearing fibrofatty
tissue without mass. Mammographic images were processed with CAD.

Physical Exam: I palpate soft fullness in the RIGHT axilla, not
associated with mass.

Ultrasound: Targeted ultrasound is performed, showing normal
appearing RIGHT axillary contents. The area of concern corresponds
fibrofatty tissue superficial to the humeral head. Lymph nodes in
the RIGHT axilla have normal morphology.

LEFT BREAST:

Mammogram: Possible mass in the LATERAL portion of the LEFT breast
is confirmed on spot compression views. Margins are obscured.
Mammographic images were processed with CAD.

Physical Exam: I palpate no abnormality in the LATERAL aspect of the
LEFT breast. The patient is concerned regarding thickening in the 1
o'clock location of the LEFT breast. In this region, I palpate soft
thickening without discrete mass.

Ultrasound: Targeted ultrasound is performed, showing a small cyst
in the 2 o'clock location of the LEFT breast 3 centimeters from the
nipple which measures 0.6 x 0.3 x 0.4 centimeters. There is no solid
component or internal blood flow.
IMPRESSION: 1. Mildly prominent RIGHT axillary fat pad accounting for the
patient's palpable abnormality.
2. Small cyst in the LEFT breast.
3.  No mammographic or ultrasound evidence for malignancy.

RECOMMENDATION:
Screening mammogram in one year.(Code:[TH])

I have discussed the findings and recommendations with the patient.
If applicable, a reminder letter will be sent to the patient
regarding the next appointment.

BI-RADS CATEGORY  2: Benign.

## 2020-10-01 IMAGING — MG DIGITAL DIAGNOSTIC BILAT W/ TOMO W/ CAD
8 of 14 series · 8 of 40 positions shown · non-contrast
Comparison: Previous exam(s).

CLINICAL DATA: Palpable abnormality in the RIGHT axilla.

EXAM:
DIGITAL DIAGNOSTIC BILATERAL MAMMOGRAM WITH CAD AND TOMO
ULTRASOUND BILATERAL BREAST

[L CC synth-2D (1 of 2)]
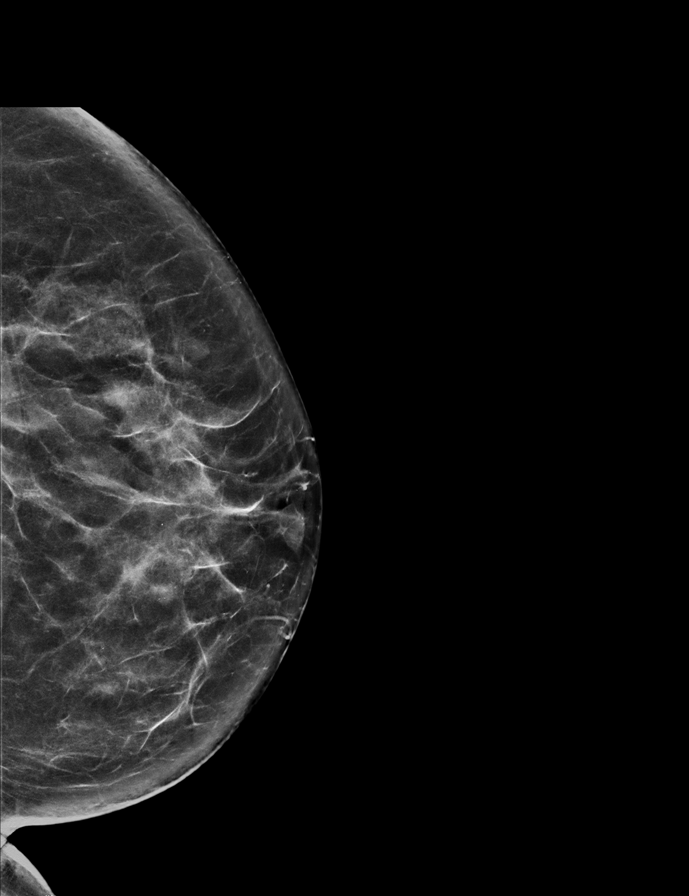

[L CC synth-2D (2 of 2)]
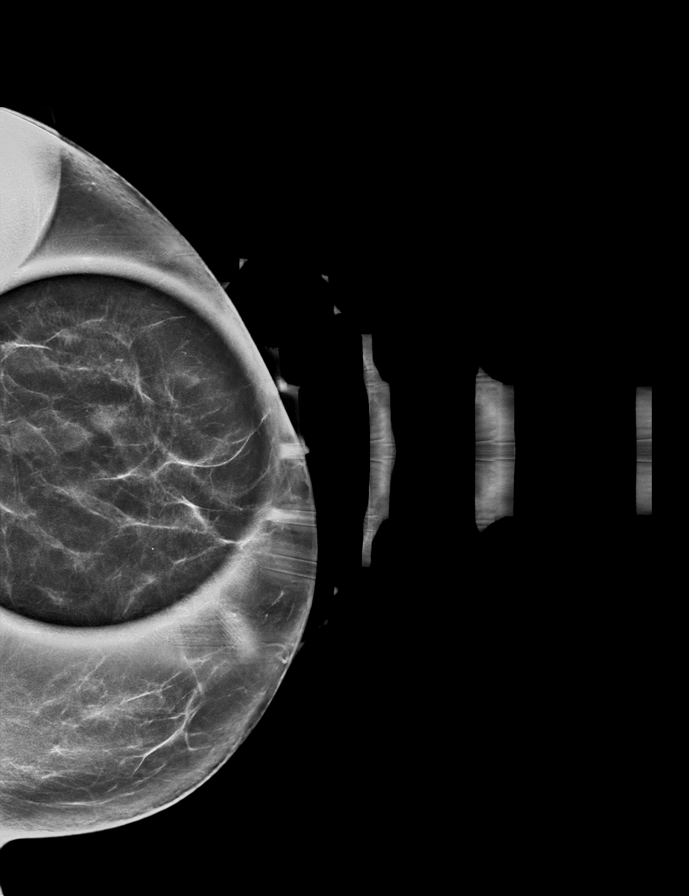

[L MLO synth-2D (1 of 2)]
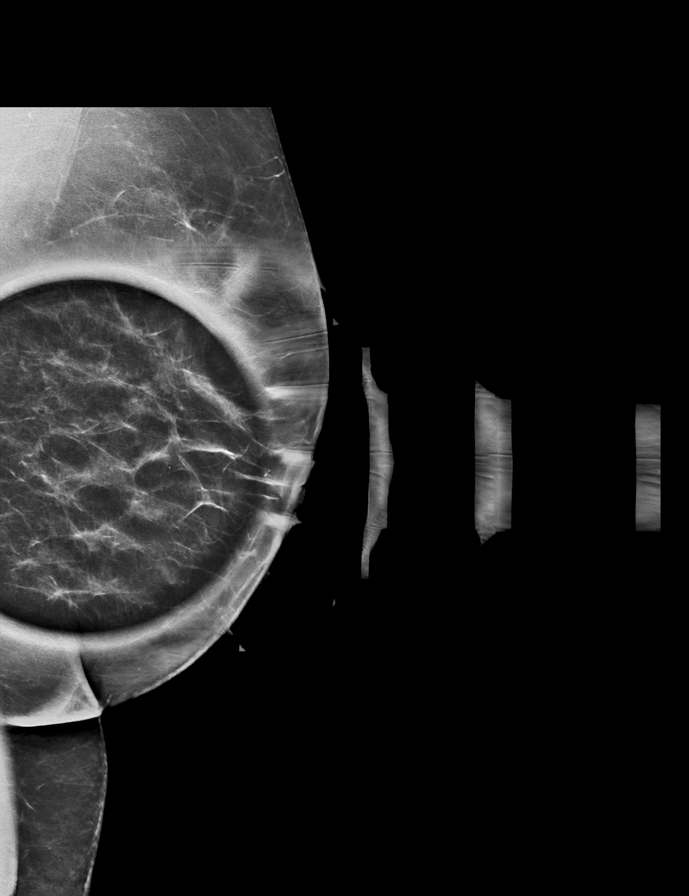

[L MLO synth-2D (2 of 2)]
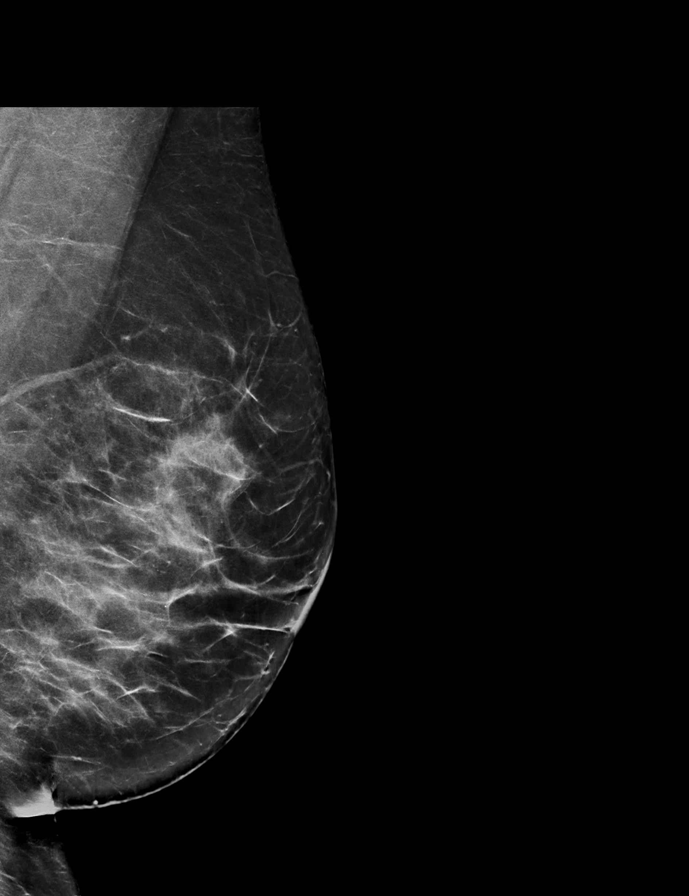

[R CC synth-2D]
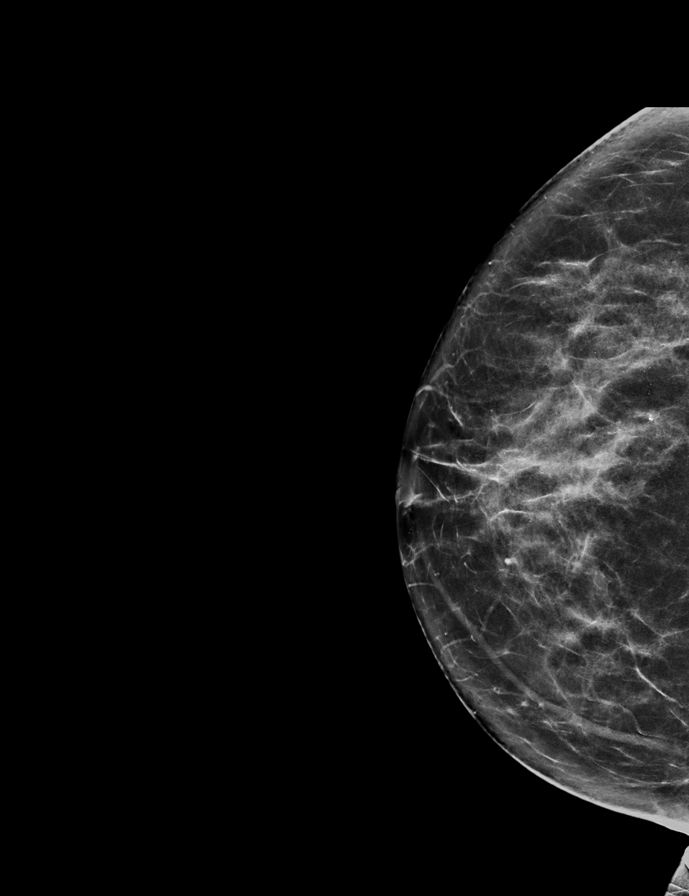

[R MLO synth-2D (1 of 2)]
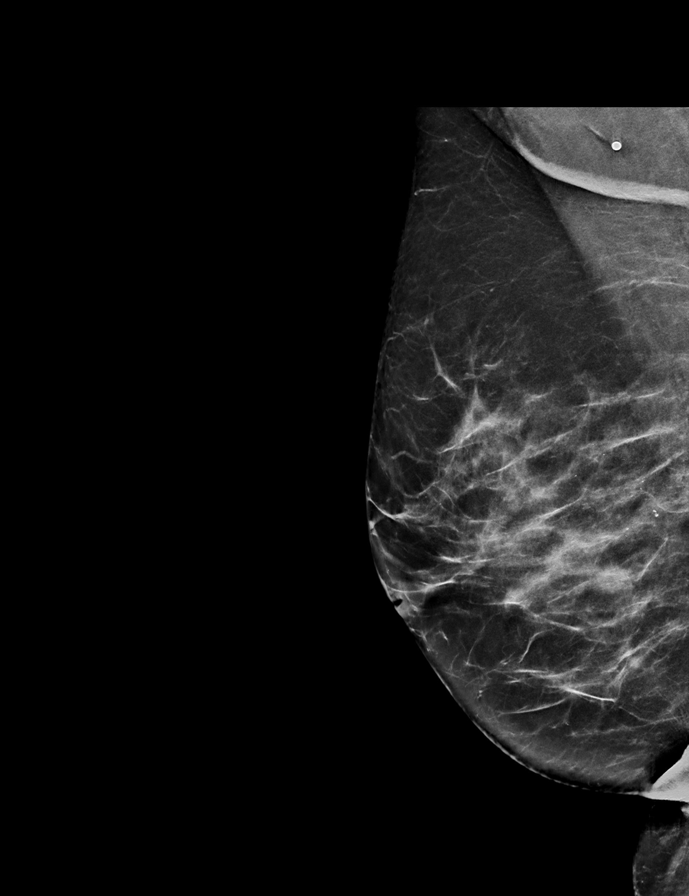

[R MLO synth-2D (2 of 2)]
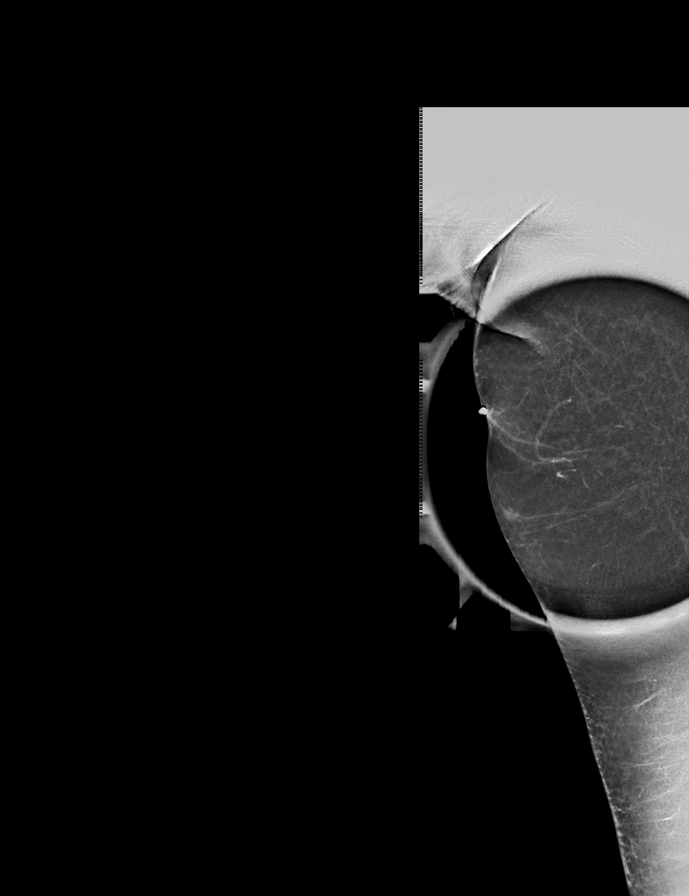

[R CC tomo · tomo slice 32/63.0]
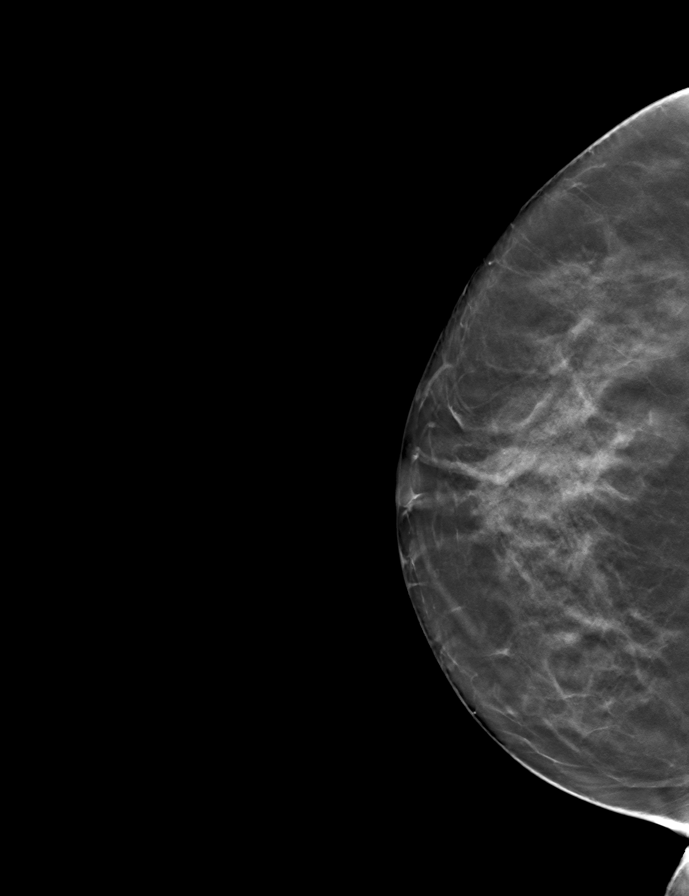

[8 of 40 positions shown; findings below may reference images not displayed]

ACR Breast Density Category c: The breast tissue is heterogeneously
dense, which may obscure small masses.
FINDINGS: RIGHT BREAST:

Mammogram: No suspicious mass, distortion, or microcalcifications
are identified to suggest presence of malignancy. Spot tangential
view of the RIGHT axillary region shows normal appearing fibrofatty
tissue without mass. Mammographic images were processed with CAD.

Physical Exam: I palpate soft fullness in the RIGHT axilla, not
associated with mass.

Ultrasound: Targeted ultrasound is performed, showing normal
appearing RIGHT axillary contents. The area of concern corresponds
fibrofatty tissue superficial to the humeral head. Lymph nodes in
the RIGHT axilla have normal morphology.

LEFT BREAST:

Mammogram: Possible mass in the LATERAL portion of the LEFT breast
is confirmed on spot compression views. Margins are obscured.
Mammographic images were processed with CAD.

Physical Exam: I palpate no abnormality in the LATERAL aspect of the
LEFT breast. The patient is concerned regarding thickening in the 1
o'clock location of the LEFT breast. In this region, I palpate soft
thickening without discrete mass.

Ultrasound: Targeted ultrasound is performed, showing a small cyst
in the 2 o'clock location of the LEFT breast 3 centimeters from the
nipple which measures 0.6 x 0.3 x 0.4 centimeters. There is no solid
component or internal blood flow.
IMPRESSION: 1. Mildly prominent RIGHT axillary fat pad accounting for the
patient's palpable abnormality.
2. Small cyst in the LEFT breast.
3.  No mammographic or ultrasound evidence for malignancy.

RECOMMENDATION:
Screening mammogram in one year.(Code:[TH])

I have discussed the findings and recommendations with the patient.
If applicable, a reminder letter will be sent to the patient
regarding the next appointment.

BI-RADS CATEGORY  2: Benign.

## 2020-10-01 IMAGING — US US BREAST*L* LIMITED INC AXILLA
1 series · 8 of 8 positions shown · non-contrast
Comparison: Previous exam(s).

CLINICAL DATA: Palpable abnormality in the RIGHT axilla.

EXAM:
DIGITAL DIAGNOSTIC BILATERAL MAMMOGRAM WITH CAD AND TOMO
ULTRASOUND BILATERAL BREAST

[Series 1: us breast*left* limited inc axilla · 0.07mm/px · 8 of 8 slices shown]
[im 1/8]
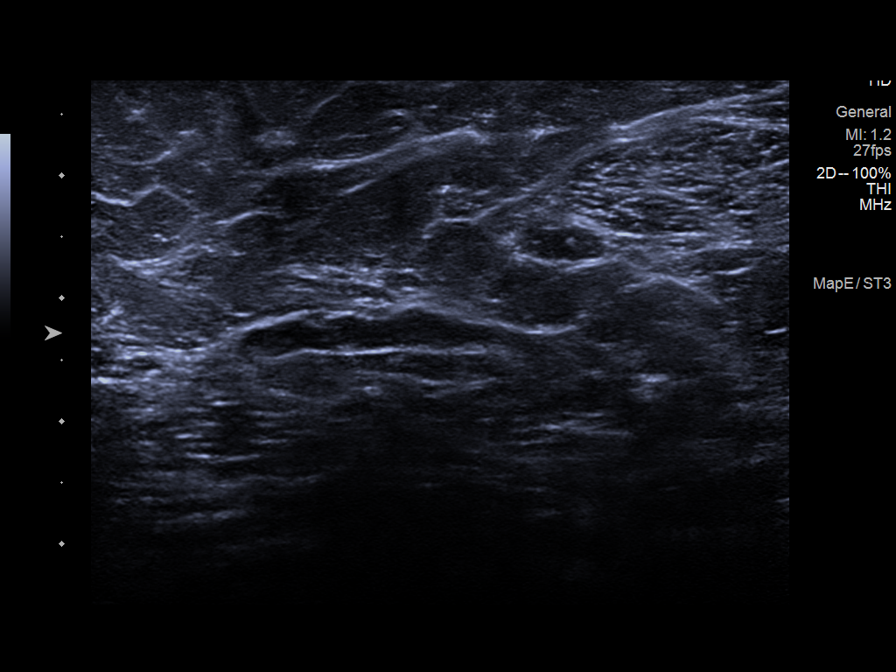
[im 2/8]
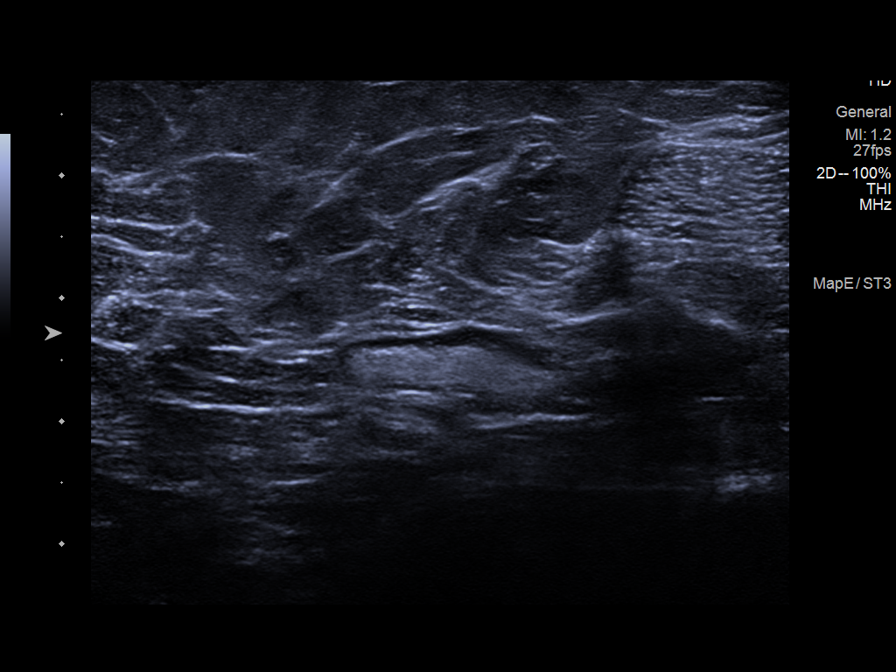
[im 3/8]
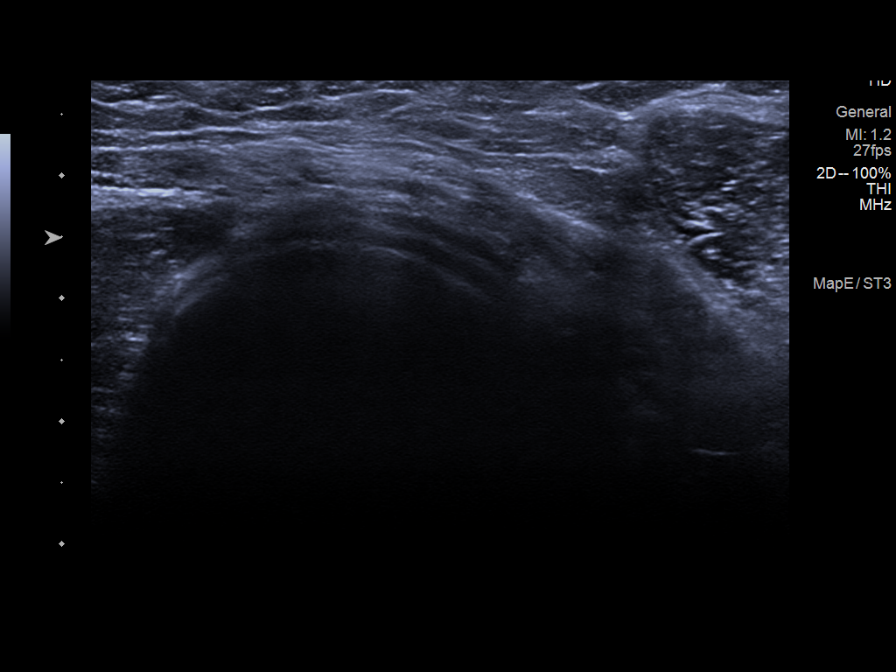
[im 4/8]
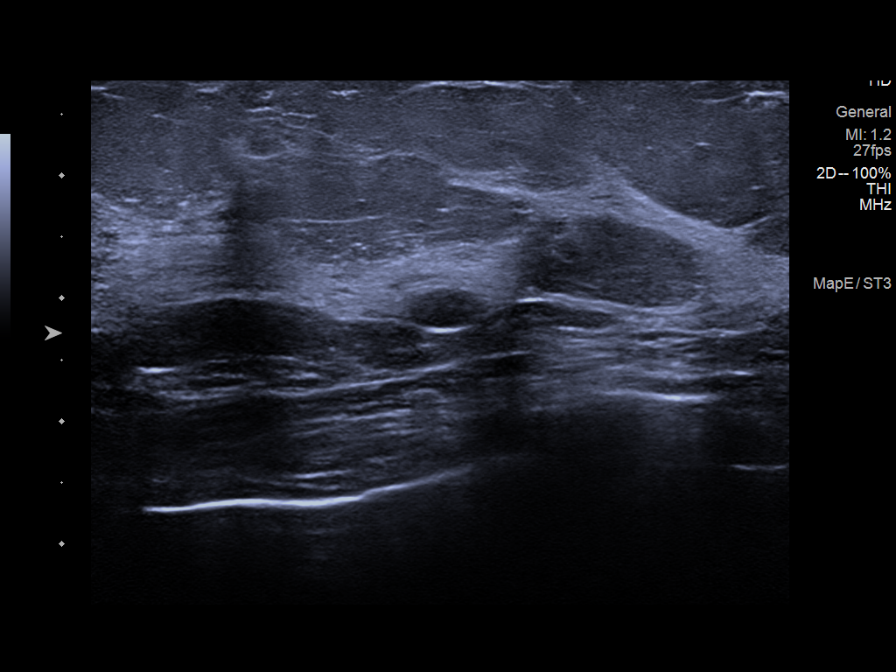
[im 5/8]
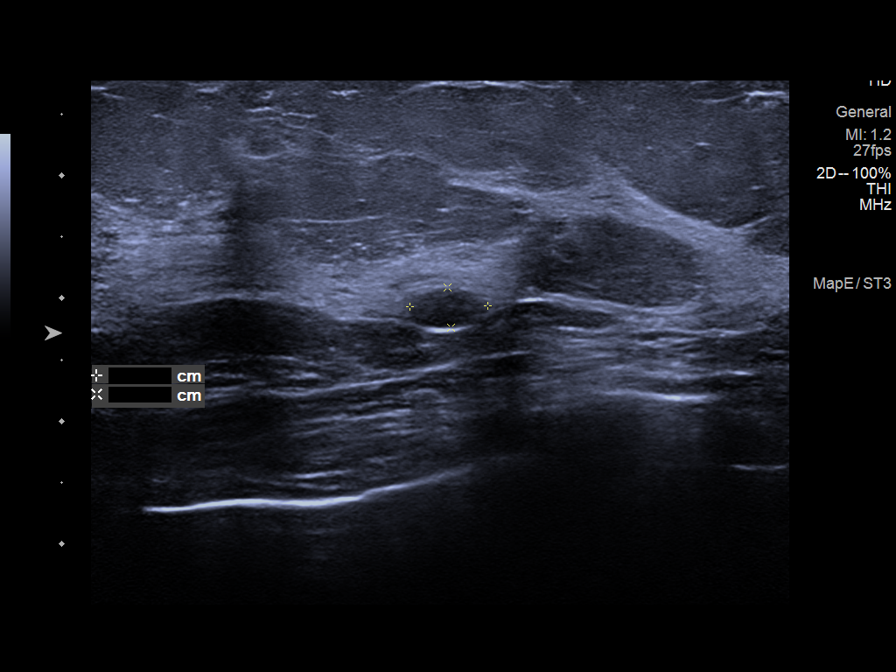
[im 6/8]
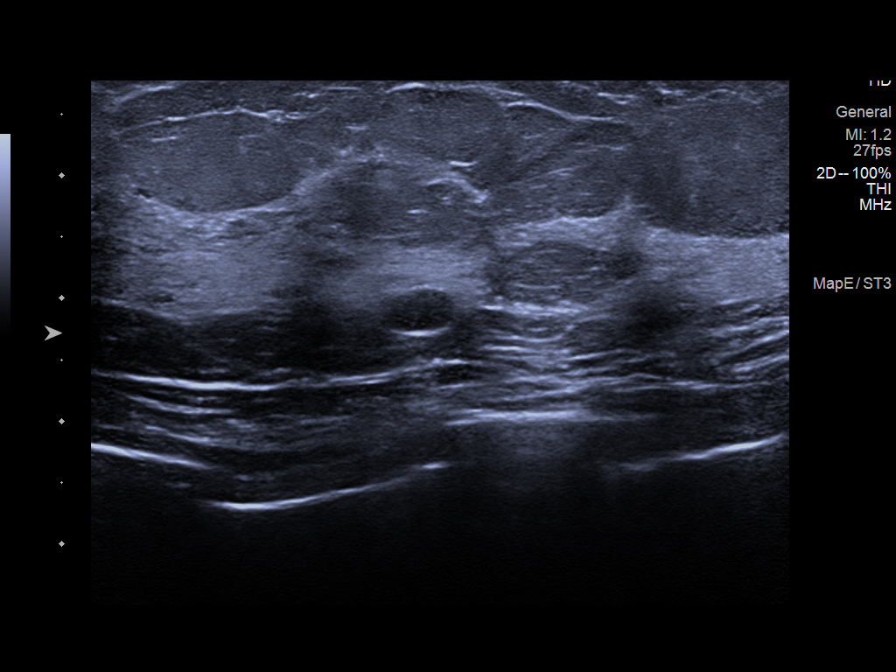
[im 7/8]
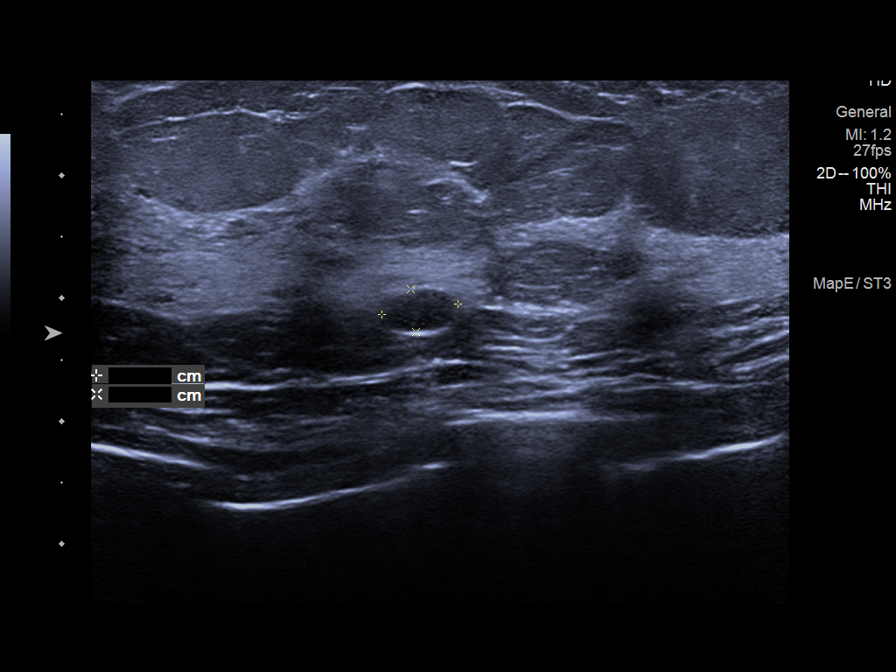
[im 8/8]
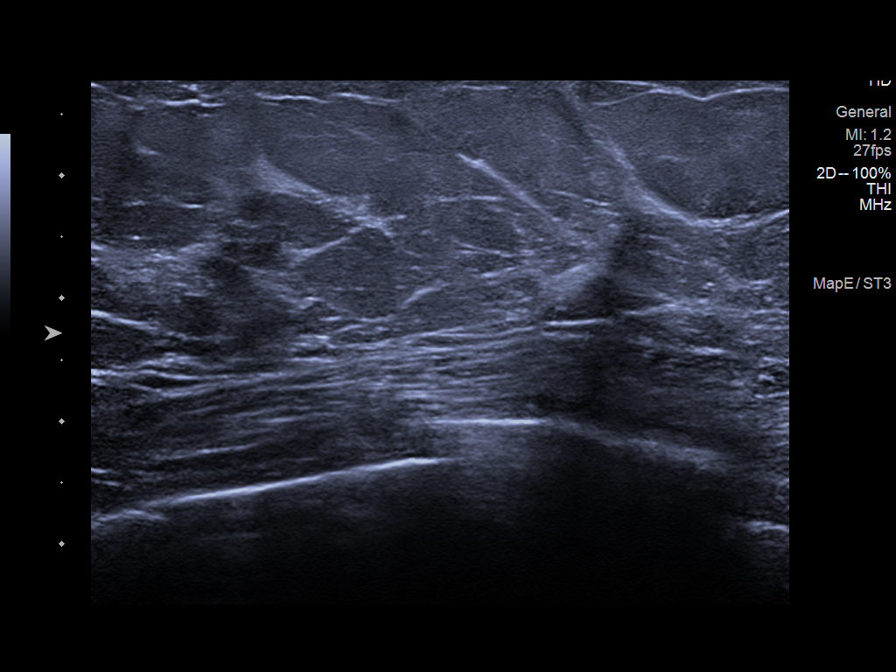

[8 of 8 positions shown; findings below may reference images not displayed]

ACR Breast Density Category c: The breast tissue is heterogeneously
dense, which may obscure small masses.
FINDINGS: RIGHT BREAST:

Mammogram: No suspicious mass, distortion, or microcalcifications
are identified to suggest presence of malignancy. Spot tangential
view of the RIGHT axillary region shows normal appearing fibrofatty
tissue without mass. Mammographic images were processed with CAD.

Physical Exam: I palpate soft fullness in the RIGHT axilla, not
associated with mass.

Ultrasound: Targeted ultrasound is performed, showing normal
appearing RIGHT axillary contents. The area of concern corresponds
fibrofatty tissue superficial to the humeral head. Lymph nodes in
the RIGHT axilla have normal morphology.

LEFT BREAST:

Mammogram: Possible mass in the LATERAL portion of the LEFT breast
is confirmed on spot compression views. Margins are obscured.
Mammographic images were processed with CAD.

Physical Exam: I palpate no abnormality in the LATERAL aspect of the
LEFT breast. The patient is concerned regarding thickening in the 1
o'clock location of the LEFT breast. In this region, I palpate soft
thickening without discrete mass.

Ultrasound: Targeted ultrasound is performed, showing a small cyst
in the 2 o'clock location of the LEFT breast 3 centimeters from the
nipple which measures 0.6 x 0.3 x 0.4 centimeters. There is no solid
component or internal blood flow.
IMPRESSION: 1. Mildly prominent RIGHT axillary fat pad accounting for the
patient's palpable abnormality.
2. Small cyst in the LEFT breast.
3.  No mammographic or ultrasound evidence for malignancy.

RECOMMENDATION:
Screening mammogram in one year.(Code:[TH])

I have discussed the findings and recommendations with the patient.
If applicable, a reminder letter will be sent to the patient
regarding the next appointment.

BI-RADS CATEGORY  2: Benign.

## 2020-10-05 ENCOUNTER — Other Ambulatory Visit: Payer: Self-pay | Admitting: Family Medicine

## 2020-10-10 ENCOUNTER — Other Ambulatory Visit: Payer: Self-pay | Admitting: Family

## 2020-11-04 ENCOUNTER — Other Ambulatory Visit: Payer: Self-pay

## 2020-11-06 ENCOUNTER — Other Ambulatory Visit: Payer: Self-pay

## 2020-11-06 ENCOUNTER — Encounter: Payer: Self-pay | Admitting: Family Medicine

## 2020-11-06 ENCOUNTER — Ambulatory Visit: Payer: BC Managed Care – PPO | Admitting: Family Medicine

## 2020-11-06 DIAGNOSIS — S46812A Strain of other muscles, fascia and tendons at shoulder and upper arm level, left arm, initial encounter: Secondary | ICD-10-CM | POA: Diagnosis not present

## 2020-11-06 DIAGNOSIS — E039 Hypothyroidism, unspecified: Secondary | ICD-10-CM | POA: Diagnosis not present

## 2020-11-06 DIAGNOSIS — G479 Sleep disorder, unspecified: Secondary | ICD-10-CM

## 2020-11-06 DIAGNOSIS — J309 Allergic rhinitis, unspecified: Secondary | ICD-10-CM

## 2020-11-06 DIAGNOSIS — S46819A Strain of other muscles, fascia and tendons at shoulder and upper arm level, unspecified arm, initial encounter: Secondary | ICD-10-CM | POA: Insufficient documentation

## 2020-11-06 LAB — TSH: TSH: 3.77 u[IU]/mL (ref 0.35–4.50)

## 2020-11-06 MED ORDER — AZELASTINE HCL 0.1 % NA SOLN: 2.0000 | Freq: Two times a day (BID) | NASAL | 12 refills | Status: DC

## 2020-11-06 NOTE — Patient Instructions (Signed)
Nice to see you. We will try Astelin for your allergies.  Please discontinue the Flonase and the Claritin. Please do not watch any TV or look at any screens the hour prior to bed. Please try to not take any naps. Please monitor your trapezius muscle and if it is not improving with time and rest please let us know.

## 2020-11-06 NOTE — Assessment & Plan Note (Signed)
Advised to stop screen use 1 hour prior to going to bed.  She will move her Xyzal to the morning to see if that makes a difference.  We will check her thyroid function to see if that is playing a role.  Advised not to take naps and that it may take several weeks of not taking naps for her to reset her sleep cycle.

## 2020-11-06 NOTE — Assessment & Plan Note (Signed)
Discussed rest and heat.  She will monitor and if not improving with rest she will let us know.

## 2020-11-06 NOTE — Progress Notes (Signed)
Sarah Rumps, MD Phone: [phone]  Sarah Moran is a 40 y.o. female who presents today for f/u.  Allergic rhinitis: This is a chronic ongoing issue.  She has congestion in the mornings that improves throughout the day.  Some rhinorrhea.  Notes symptoms get worse when she spends time outside.  She has been using Flonase though has been having some nosebleeds with this.  Vaseline helps with that.  She is also taking Claritin and Xyzal.  Anxiety/depression: She denies any symptoms.  No longer on any medication for this.  Difficulty sleeping: This has been an ongoing issue.  She goes to bed at 10-11 PM and wakes up at 2 AM most nights.  Oftentimes will take a nap sometime between 1 and 4 PM.  She has tried not taking naps and that has not made much of a difference.  No caffeine or alcohol intake.  She notes she falls asleep easily and sleeps well for 3 to 4 hours though then wakes up.  She tried melatonin though that was not terribly beneficial.  She also watches TV the hour before bed.  Trapezius strain: Patient does workout in the gym and notes this has been tender recently.  She has been resting it using heat on it.  She notes this oftentimes will happen and she will take a break and it will improve.  Social History   Tobacco Use  Smoking Status Never Smoker  Smokeless Tobacco Never Used    Current Outpatient Medications on File Prior to Visit  Medication Sig Dispense Refill  . Cholecalciferol (VITAMIN D3) 50 MCG (2000 UT) capsule Take 2,000 Units by mouth daily.    Marland Kitchen Cod Liver Oil CAPS Take 1 capsule by mouth at bedtime.     . diclofenac Sodium (VOLTAREN) 1 % GEL Apply 2 g topically 4 (four) times daily.    Marland Kitchen escitalopram (LEXAPRO) 5 MG tablet Take 1 tablet (5 mg total) by mouth daily. 90 tablet 1  . levocetirizine (XYZAL) 5 MG tablet Take 5 mg by mouth every evening.    Marland Kitchen LINZESS 290 MCG CAPS capsule TAKE 1 CAPSULE (290 MCG TOTAL) BY MOUTH 2 (TWO) TIMES DAILY BEFORE A MEAL 180  capsule 3  . Multiple Vitamin (MULTI-VITAMIN) tablet Take by mouth.    . SUMAtriptan (IMITREX) 50 MG tablet TAKE ONE TABLET BY MOUTH AT ONSET OF MIGRAINE - MAY REPEAT DOSE IN 2 HOURS IF HEADACHE PERSISTS 10 tablet 2  . SYNTHROID 75 MCG tablet Take 1 tablet (75 mcg total) by mouth daily before breakfast. 90 tablet 1   No current facility-administered medications on file prior to visit.     ROS see history of present illness  Objective  Physical Exam Vitals:   11/06/20 0816  BP: 90/60  Pulse: 83  Temp: 98.1 F (36.7 C)  SpO2: 99%    BP Readings from Last 3 Encounters:  11/06/20 90/60  09/24/20 100/70  07/31/20 90/60   Wt Readings from Last 3 Encounters:  11/06/20 133 lb 6.4 oz (60.5 kg)  09/24/20 129 lb 3.2 oz (58.6 kg)  07/31/20 132 lb 12.8 oz (60.2 kg)    Physical Exam Constitutional:      General: She is not in acute distress.    Appearance: She is not diaphoretic.  Cardiovascular:     Rate and Rhythm: Normal rate and regular rhythm.     Heart sounds: Normal heart sounds.  Pulmonary:     Effort: Pulmonary effort is normal.  Breath sounds: Normal breath sounds.  Musculoskeletal:        General: No edema.     Right lower leg: No edema.     Left lower leg: No edema.     Comments: Slight spasm and tenderness of the left mid trapezius  Skin:    General: Skin is warm and dry.  Neurological:     Mental Status: She is alert.      Assessment/Plan: Please see individual problem list.  Problem List Items Addressed This Visit    Allergic rhinitis    Chronic ongoing issues.  She will discontinue the Claritin.  She will continue the Xyzal.  We will discontinue Flonase and start on Astelin in its place.  She will monitor her symptoms.      Relevant Medications   azelastine (ASTELIN) 0.1 % nasal spray   Hypothyroidism    Due for recheck of TSH.  Continue Synthroid.      Relevant Orders   TSH   Sleeping difficulty    Advised to stop screen use 1 hour prior  to going to bed.  She will move her Xyzal to the morning to see if that makes a difference.  We will check her thyroid function to see if that is playing a role.  Advised not to take naps and that it may take several weeks of not taking naps for her to reset her sleep cycle.      Trapezius strain    Discussed rest and heat.  She will monitor and if not improving with rest she will let us know.         This visit occurred during the SARS-CoV-2 public health emergency.  Safety protocols were in place, including screening questions prior to the visit, additional usage of staff PPE, and extensive cleaning of exam room while observing appropriate contact time as indicated for disinfecting solutions.    Sarah Rumps, MD [place]

## 2020-11-06 NOTE — Assessment & Plan Note (Signed)
Chronic ongoing issues.  She will discontinue the Claritin.  She will continue the Xyzal.  We will discontinue Flonase and start on Astelin in its place.  She will monitor her symptoms.

## 2020-11-06 NOTE — Assessment & Plan Note (Signed)
Due for recheck of TSH.  Continue Synthroid.

## 2020-11-19 DIAGNOSIS — R519 Headache, unspecified: Secondary | ICD-10-CM | POA: Diagnosis not present

## 2020-11-19 DIAGNOSIS — M542 Cervicalgia: Secondary | ICD-10-CM | POA: Insufficient documentation

## 2020-11-19 DIAGNOSIS — R251 Tremor, unspecified: Secondary | ICD-10-CM | POA: Diagnosis not present

## 2020-11-19 DIAGNOSIS — F411 Generalized anxiety disorder: Secondary | ICD-10-CM | POA: Diagnosis not present

## 2020-11-19 DIAGNOSIS — R202 Paresthesia of skin: Secondary | ICD-10-CM | POA: Diagnosis not present

## 2020-11-28 ENCOUNTER — Other Ambulatory Visit: Payer: Self-pay | Admitting: Endocrinology

## 2020-11-28 ENCOUNTER — Other Ambulatory Visit: Payer: Self-pay | Admitting: Family Medicine

## 2020-11-28 DIAGNOSIS — E039 Hypothyroidism, unspecified: Secondary | ICD-10-CM

## 2020-11-30 NOTE — Telephone Encounter (Signed)
Please contact the patient and see what dose of Synthroid she is currently taking.

## 2020-12-22 ENCOUNTER — Ambulatory Visit: Payer: BC Managed Care – PPO | Admitting: Obstetrics and Gynecology

## 2020-12-27 ENCOUNTER — Encounter: Payer: Self-pay | Admitting: Family Medicine

## 2020-12-27 DIAGNOSIS — E039 Hypothyroidism, unspecified: Secondary | ICD-10-CM

## 2020-12-31 ENCOUNTER — Other Ambulatory Visit (INDEPENDENT_AMBULATORY_CARE_PROVIDER_SITE_OTHER): Payer: BC Managed Care – PPO

## 2020-12-31 ENCOUNTER — Other Ambulatory Visit: Payer: Self-pay

## 2020-12-31 DIAGNOSIS — E039 Hypothyroidism, unspecified: Secondary | ICD-10-CM

## 2020-12-31 LAB — TSH: TSH: 4.56 u[IU]/mL — ABNORMAL HIGH (ref 0.35–4.50)

## 2021-01-02 DIAGNOSIS — J029 Acute pharyngitis, unspecified: Secondary | ICD-10-CM | POA: Diagnosis not present

## 2021-01-06 ENCOUNTER — Ambulatory Visit: Payer: BC Managed Care – PPO | Admitting: Obstetrics and Gynecology

## 2021-01-06 NOTE — Addendum Note (Signed)
Addended by: Leone Haven on: 01/06/2021 08:45 AM   Modules accepted: Orders

## 2021-01-10 ENCOUNTER — Encounter: Payer: Self-pay | Admitting: Obstetrics and Gynecology

## 2021-01-11 DIAGNOSIS — G379 Demyelinating disease of central nervous system, unspecified: Secondary | ICD-10-CM | POA: Diagnosis not present

## 2021-01-11 DIAGNOSIS — R251 Tremor, unspecified: Secondary | ICD-10-CM | POA: Diagnosis not present

## 2021-01-11 DIAGNOSIS — R519 Headache, unspecified: Secondary | ICD-10-CM | POA: Diagnosis not present

## 2021-01-11 DIAGNOSIS — R202 Paresthesia of skin: Secondary | ICD-10-CM | POA: Diagnosis not present

## 2021-01-12 ENCOUNTER — Other Ambulatory Visit: Payer: Self-pay | Admitting: Neurology

## 2021-01-12 DIAGNOSIS — G379 Demyelinating disease of central nervous system, unspecified: Secondary | ICD-10-CM

## 2021-01-18 ENCOUNTER — Ambulatory Visit (INDEPENDENT_AMBULATORY_CARE_PROVIDER_SITE_OTHER): Payer: BC Managed Care – PPO | Admitting: Obstetrics and Gynecology

## 2021-01-18 ENCOUNTER — Other Ambulatory Visit: Payer: Self-pay

## 2021-01-18 ENCOUNTER — Encounter: Payer: Self-pay | Admitting: Obstetrics and Gynecology

## 2021-01-18 VITALS — BP 100/62 | Ht 60.0 in | Wt 138.0 lb

## 2021-01-18 DIAGNOSIS — Z0000 Encounter for general adult medical examination without abnormal findings: Secondary | ICD-10-CM | POA: Diagnosis not present

## 2021-01-18 DIAGNOSIS — Z9189 Other specified personal risk factors, not elsewhere classified: Secondary | ICD-10-CM | POA: Diagnosis not present

## 2021-01-18 DIAGNOSIS — Z1231 Encounter for screening mammogram for malignant neoplasm of breast: Secondary | ICD-10-CM

## 2021-01-18 DIAGNOSIS — Z01419 Encounter for gynecological examination (general) (routine) without abnormal findings: Secondary | ICD-10-CM | POA: Diagnosis not present

## 2021-01-18 DIAGNOSIS — Z803 Family history of malignant neoplasm of breast: Secondary | ICD-10-CM

## 2021-01-18 NOTE — Progress Notes (Signed)
Sarah Moran  PCP: Sarah Haven, MD  Chief Complaint:  Chief Complaint  Patient presents with  . Gynecologic Moran    Annual - Rt side pain ? Cyst on ovary. RM 1    History of Present Illness: Patient is a 40 y.o. [id number] presents for annual Moran. The patient has no complaints today.   LMP: No LMP recorded. Patient has had a hysterectomy.  Heavy Menses: no Dysmenorrhea: no  The patient does perform self breast exams.  There is notable family history of breast or ovarian cancer in her family. She has a myrisk score of > 20 % risk of breast cancer.   The patient has regular exercise: yes, goes to the gym 5 days a week  The patient denies current symptoms of depression.   She has been having problems with feeling her entire body vibrating. It has worsened in the last year. She is being evaluated for MS.  Review of Systems: ROS  Past Medical History:  Past Medical History:  Diagnosis Date  . Allergy   . Anal fissure   . Anxiety    no meds  . Chronic pelvic pain in female   . Family history of breast cancer    My Risk neg 5/18  . Family history of cervical cancer   . Family history of skin cancer   . Family history of throat cancer   . Frequent headaches   . Gallstone   . Gallstones   . Genetic testing of female 01/2017   My Risk/BRCA neg  . GERD (gastroesophageal reflux disease)    well controlled. Takes gingerroot when symptoms  . Hypothyroidism    s/p thyroidectomy - levothyroxine 75 mcg daily  . IBS (irritable bowel syndrome)    constipation - takes miralax daily  . Increased risk of breast cancer 01/2017   IBIS=18.8%/riskscore=21.9%  . Liver cyst 04/2018  . Migraine    takes excedrin. She has tried imitrex in the past   . Personal history of radiation therapy   . Seizure ([place]) 1997    x 1-age 33-after surgery for scoliosis  . Thyroid cancer ([place]) 2007   s/p thyroidectomy    Past Surgical History:  Past Surgical History:  Procedure  Laterality Date  . ANAL RECTAL MANOMETRY N/A 10/12/2018   Procedure: ANO RECTAL MANOMETRY;  Surgeon: Mauri Pole, MD;  Location: [facility];  Service: Endoscopy;  Laterality: N/A;  . ANTERIOR AND POSTERIOR REPAIR WITH SACROSPINOUS FIXATION N/A 08/02/2018   Procedure: ANTERIOR AND POSTERIOR REPAIR;  Surgeon: Homero Fellers, MD;  Location: [facility];  Service: Sarah;  Laterality: N/A;  . BACK SURGERY  1997   scoliosis  . CHOLECYSTECTOMY N/A 09/21/2018   Procedure: LAPAROSCOPIC CHOLECYSTECTOMY;  Surgeon: Fredirick Maudlin, MD;  Location: [facility];  Service: General;  Laterality: N/A;  . COLONOSCOPY    . ESOPHAGOGASTRODUODENOSCOPY (EGD) WITH PROPOFOL N/A 06/12/2018   Procedure: ESOPHAGOGASTRODUODENOSCOPY (EGD) WITH PROPOFOL;  Surgeon: Lin Landsman, MD;  Location: [facility];  Service: Gastroenterology;  Laterality: N/A;  . HERNIA REPAIR  1990   bilateral inguinal  . PUBOVAGINAL SLING N/A 08/02/2018   Procedure: PUBO-VAGINAL SLING- TVT;  Surgeon: Homero Fellers, MD;  Location: [facility];  Service: Sarah;  Laterality: N/A;  . THYROIDECTOMY  2007   thyroid cancer  . TOTAL ABDOMINAL HYSTERECTOMY  2008   dymenorrhea    Gynecologic History:  No LMP recorded. Patient has had a hysterectomy. Menarche: 8  History of fibroids, polyps,  or ovarian cysts? : yes  History of PCOS? no Hstory of Endometriosis? no History of abnormal pap smears? no Have you had any sexually transmitted infections in the past?  no  Last Pap: Results were: 2018 NIL  She identifies as a female. She is sexually active with men.   She haspositional  dyspareunia. She denies postcoital bleeding.    Obstetric History: [id number]  Family History:  Family History  Problem Relation Age of Onset  . Hyperlipidemia Father   . Hypertension Father   . Clotting disorder Sister   . Hepatitis B Brother   . Breast cancer Paternal Grandmother 72       with mets  . Cervical cancer Mother         dx. in her early 54s  . Breast cancer Paternal Aunt 5  . Throat cancer Paternal Aunt 57  . Breast cancer Paternal Aunt 67  . Breast cancer Paternal Aunt        18  . Skin cancer Paternal Aunt   . Cancer Cousin        unknown type dx. in her late 20s/early 26s  . Skin cancer Cousin        13 cancerous moles removed in her 35s    Social History:  Social History   Socioeconomic History  . Marital status: Married    Spouse name: Sarah Moran  . Number of children: 2  . Years of education: 53  . Highest education level: Not on file  Occupational History  . Occupation: Stay at home mom  Tobacco Use  . Smoking status: Never Smoker  . Smokeless tobacco: Never Used  Vaping Use  . Vaping Use: Never used  Substance and Sexual Activity  . Alcohol use: Yes  . Drug use: No  . Sexual activity: Yes    Birth control/protection: Surgical    Comment: Hysterectomy  Other Topics Concern  . Not on file  Social History Narrative   Sarah Moran grew up in [place]. She currently lives in [place] with her husband, Sarah Moran, and their 2 sons ([place]  and [place] ). 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 Sarah Moran belongs to the [employer]. She enjoys outdoor activities on her spare time.   Social Determinants of Health   Financial Resource Strain: Not on file  Food Insecurity: Not on file  Transportation Needs: Not on file  Physical Activity: Not on file  Stress: Not on file  Social Connections: Not on file  Intimate Partner Violence: Not on file    Allergies:  Allergies  Allergen Reactions  . Darvon [Propoxyphene] Other (See Comments)    Hallucinations   . Onion Other (See Comments)    Migraines  . Percocet [Oxycodone-Acetaminophen] Hives  . Vicodin [Hydrocodone-Acetaminophen] Hives    Medications: Prior to Admission medications   Medication Sig Start Date End Date Taking? Authorizing Provider  busPIRone (BUSPAR) 15 MG tablet  Take 7.5 mg at night for 1 week, then increase to 15 mg and continue 01/11/21  Yes [provider]  Cholecalciferol (VITAMIN D3) 50 MCG (2000 UT) capsule Take 2,000 Units by mouth daily.   Yes [provider]  Cod Liver Oil CAPS Take 1 capsule by mouth at bedtime.    Yes [provider]  diclofenac Sodium (VOLTAREN) 1 % GEL Apply 2 g topically 4 (four) times daily. 11/06/19  Yes [provider]  LINZESS 290 MCG CAPS capsule TAKE 1 CAPSULE (  290 MCG TOTAL) BY MOUTH 2 (TWO) TIMES DAILY BEFORE A MEAL 01/27/20  Yes Nandigam, Venia Minks, MD  SYNTHROID 88 MCG tablet TAKE 1 TABLET BY MOUTH DAILY BEFORE BREAKFAST. 11/28/20  Yes Renato Shin, MD  azelastine (ASTELIN) 0.1 % nasal spray Place 2 sprays into both nostrils 2 (two) times daily. Use in each nostril as directed Patient not taking: Reported on 01/18/2021 11/06/20   Sarah Haven, MD  escitalopram (LEXAPRO) 5 MG tablet Take 1 tablet (5 mg total) by mouth daily. Patient not taking: Reported on 01/18/2021 07/31/20   Sarah Haven, MD  levocetirizine (XYZAL) 5 MG tablet Take 5 mg by mouth every evening.    [provider]  Multiple Vitamin (MULTI-VITAMIN) tablet Take by mouth. Patient not taking: Reported on 01/18/2021    [provider]  SUMAtriptan (IMITREX) 50 MG tablet TAKE ONE TABLET BY MOUTH AT ONSET OF MIGRAINE - MAY REPEAT DOSE IN 2 HOURS IF HEADACHE PERSISTS 07/27/20   Einar Pheasant, MD    Physical Moran Vitals: Blood pressure 100/62, height 5' (1.524 m), weight 138 lb (62.6 kg).  Physical Moran Constitutional:      Appearance: She is well-developed.  Genitourinary:     Genitourinary Comments: External: Normal appearing vulva. No lesions noted.  Speculum examination: Normal appearing cervix. No blood in the vaginal vault. No discharge.   Bimanual examination: Uterus midline, non-tender, normal in size, shape and contour.  No CMT. No adnexal masses. No adnexal tenderness. Pelvis not  fixed.  Breast Moran: breast equal without skin changes, nipple discharge, breast lump or enlarged lymph nodes   HENT:     Head: Normocephalic and atraumatic.  Neck:     Thyroid: No thyromegaly.  Cardiovascular:     Rate and Rhythm: Normal rate and regular rhythm.     Heart sounds: Normal heart sounds.  Pulmonary:     Effort: Pulmonary effort is normal.     Breath sounds: Normal breath sounds.  Abdominal:     General: Bowel sounds are normal. There is no distension.     Palpations: Abdomen is soft. There is no mass.  Musculoskeletal:     Cervical back: Neck supple.  Neurological:     Mental Status: She is alert and oriented to person, place, and time.  Skin:    General: Skin is warm and dry.  Psychiatric:        Behavior: Behavior normal.        Thought Content: Thought content normal.        Judgment: Judgment normal.  Vitals reviewed.      Female chaperone present for pelvic and breast  portions of the physical Moran  Assessment: 40 y.o. G2P2002 routine annual Moran  Plan: Problem List Items Addressed This Visit      Other   Family history of breast cancer   Relevant Orders   MR BREAST BILATERAL W WO CONTRAST INC CAD    Other Visit Diagnoses    Breast cancer screening by mammogram    -  Primary   Increased risk of breast cancer       Relevant Orders   MR BREAST BILATERAL W [place] CAD   Healthcare maintenance       Normal pelvic Moran       Normal breast Moran          1) Mammogram - recommend yearly screening mammogram.  Mammogram Is up to date. Discussed yearly breast MRI because of family history- breast MRI  ordered today.   2) STI screening was offered and declined  3) ASCCP guidelines and rational discussed. Pap smears discontinued after hyesterectomy.   4) Colonoscopy -- Start at 81  6) Routine healthcare maintenance including cholesterol, diabetes screening discussed managed by PCP   Adrian Prows MD, [place], [facility] 01/18/2021 4:31 PM

## 2021-01-18 NOTE — Patient Instructions (Signed)
Exercising to Stay Healthy To become healthy and stay healthy, it is recommended that you do moderate-intensity and vigorous-intensity exercise. You can tell that you are exercising at a moderate intensity if your heart starts beating faster and you start breathing faster but can still hold a conversation. You can tell that you are exercising at a vigorous intensity if you are breathing much harder and faster and cannot hold a conversation while exercising. Exercising regularly is important. It has many health benefits, such as:  Improving overall fitness, flexibility, and endurance.  Increasing bone density.  Helping with weight control.  Decreasing body fat.  Increasing muscle strength.  Reducing stress and tension.  Improving overall health. How often should I exercise? Choose an activity that you enjoy, and set realistic goals. Your health care provider can help you make an activity plan that works for you. Exercise regularly as told by your health care provider. This may include:  Doing strength training two times a week, such as: ? Lifting weights. ? Using resistance bands. ? Push-ups. ? Sit-ups. ? Yoga.  Doing a certain intensity of exercise for a given amount of time. Choose from these options: ? A total of 150 minutes of moderate-intensity exercise every week. ? A total of 75 minutes of vigorous-intensity exercise every week. ? A mix of moderate-intensity and vigorous-intensity exercise every week. Children, pregnant women, people who have not exercised regularly, people who are overweight, and older adults may need to talk with a health care provider about what activities are safe to do. If you have a medical condition, be sure to talk with your health care provider before you start a new exercise program. What are some exercise ideas? Moderate-intensity exercise ideas include:  Walking 1 mile (1.6 km) in about 15  minutes.  Biking.  Hiking.  Golfing.  Dancing.  Water aerobics. Vigorous-intensity exercise ideas include:  Walking 4.5 miles (7.2 km) or more in about 1 hour.  Jogging or running 5 miles (8 km) in about 1 hour.  Biking 10 miles (16.1 km) or more in about 1 hour.  Lap swimming.  Roller-skating or in-line skating.  Cross-country skiing.  Vigorous competitive sports, such as football, basketball, and soccer.  Jumping rope.  Aerobic dancing.   What are some everyday activities that can help me to get exercise?  Yard work, such as: ? Pushing a lawn mower. ? Raking and bagging leaves.  Washing your car.  Pushing a stroller.  Shoveling snow.  Gardening.  Washing windows or floors. How can I be more active in my day-to-day activities?  Use stairs instead of an elevator.  Take a walk during your lunch break.  If you drive, park your car farther away from your work or school.  If you take public transportation, get off one stop early and walk the rest of the way.  Stand up or walk around during all of your indoor phone calls.  Get up, stretch, and walk around every 30 minutes throughout the day.  Enjoy exercise with a friend. Support to continue exercising will help you keep a regular routine of activity. What guidelines can I follow while exercising?  Before you start a new exercise program, talk with your health care provider.  Do not exercise so much that you hurt yourself, feel dizzy, or get very short of breath.  Wear comfortable clothes and wear shoes with good support.  Drink plenty of water while you exercise to prevent dehydration or heat stroke.  Work out until   your breathing and your heartbeat get faster. Where to find more information  U.[place] Department of Health and Human Services: [employer].at  Centers for Disease Control and Prevention (CDC): http://www.wolf.info/ Summary  Exercising regularly is important. It will improve your overall fitness,  flexibility, and endurance.  Regular exercise also will improve your overall health. It can help you control your weight, reduce stress, and improve your bone density.  Do not exercise so much that you hurt yourself, feel dizzy, or get very short of breath.  Before you start a new exercise program, talk with your health care provider. This information is not intended to replace advice given to you by your health care provider. Make sure you discuss any questions you have with your health care provider. Document Revised: 08/18/2017 Document Reviewed: 07/27/2017 Elsevier Patient Education  2021 [place].   Budget-Friendly Healthy Eating There are many ways to save money at the grocery store and continue to eat healthy. You can be successful if you:  Plan meals according to your budget.  Make a grocery list and only purchase food according to your grocery list.  Prepare food yourself at home. What are tips for following this plan? Reading food labels  Compare food labels between brand name foods and the store brand. Often the nutritional value is the same, but the store brand is lower cost.  Look for products that do not have added sugar, fat, or salt (sodium). These often cost the same but are healthier for you. Products may be labeled as: ? Sugar-free. ? Nonfat. ? Low-fat. ? Sodium-free. ? Low-sodium.  Look for lean ground beef labeled as at least 92% lean and 8% fat. Shopping  Buy only the items on your grocery list and go only to the areas of the store that have the items on your list.  Use coupons only for foods and brands you normally buy. Avoid buying items you wouldn't normally buy simply because they are on sale.  Check online and in newspapers for weekly deals.  Buy healthy items from the bulk bins when available, such as herbs, spices, flour, pasta, nuts, and dried fruit.  Buy fruits and vegetables that are in season. Prices are usually lower on in-season  produce.  Look at the unit price on the price tag. Use it to compare different brands and sizes to find out which item is the best deal.  Choose healthy items that are often low-cost, such as carrots, potatoes, apples, bananas, and oranges. Dried or canned beans are a low-cost protein source.  Buy in bulk and freeze extra food. Items you can buy in bulk include meats, fish, poultry, frozen fruits, and frozen vegetables.  Avoid buying "ready-to-eat" foods, such as pre-cut fruits and vegetables and pre-made salads.  If possible, shop around to discover where you can find the best prices. Consider other retailers such as dollar stores, larger [employer], local fruit and vegetable stands, and farmers markets.  Do not shop when you are hungry. If you shop while hungry, it may be hard to stick to your list and budget.  Resist impulse buying. Use your grocery list as your official plan for the week.  Buy a variety of vegetables and fruits by purchasing fresh, frozen, and canned items.  Look at the top and bottom shelves for deals. Foods at eye level (eye level of an adult or child) are usually more expensive.  Be efficient with your time when shopping. The more time you spend at the store, the  more money you are likely to spend.  To save money when choosing more expensive foods like meats and dairy: ? Choose cheaper cuts of meat, such as bone-in chicken thighs and drumsticks instead of skinless and boneless chicken. When you are ready to prepare the chicken, you can remove the skin yourself to make it healthier. ? Choose lean meats like chicken or turkey instead of beef. ? Choose canned seafood, such as tuna, salmon, or sardines. ? Buy eggs as a low-cost source of protein. ? Buy dried beans and peas, such as lentils, split peas, or kidney beans instead of meats. Dried beans and peas are a good alternative source of protein. ? Buy the larger tubs of yogurt instead of individual-sized  containers.  Choose water instead of sodas and other sweetened beverages.  Avoid buying chips, cookies, and other "junk food." These items are usually expensive and not healthy.   Cooking  Make extra food and freeze the extras in meal-sized containers or in individual portions for fast meals and snacks.  Pre-cook on days when you have extra time to prepare meals in advance. You can keep these meals in the fridge or freezer and reheat for a quick meal.  When you come home from the grocery store, wash, peel, and cut fruits and vegetables so they are ready to use and eat. This will help reduce food waste. Meal planning  Do not eat out or get fast food. Prepare food at home.  Make a grocery list and make sure to bring it with you to the store. If you have a smart phone, you could use your phone to create your shopping list.  Plan meals and snacks according to a grocery list and budget you create.  Use leftovers in your meal plan for the week.  Look for recipes where you can cook once and make enough food for two meals.  Prepare budget-friendly types of meals like stews, casseroles, and stir-fry dishes.  Try some meatless meals or try "no cook" meals like salads.  Make sure that half your plate is filled with fruits or vegetables. Choose from fresh, frozen, or canned fruits and vegetables. If eating canned, remember to rinse them before eating. This will remove any excess salt added for packaging. Summary  Eating healthy on a budget is possible if you plan your meals according to your budget, purchase according to your budget and grocery list, and prepare food yourself.  Tips for buying more food on a limited budget include buying generic brands, using coupons only for foods you normally buy, and buying healthy items from the bulk bins when available.  Tips for buying cheaper food to replace expensive food include choosing cheaper, lean cuts of meat, and buying dried beans and  peas. This information is not intended to replace advice given to you by your health care provider. Make sure you discuss any questions you have with your health care provider. Document Revised: 06/18/2020 Document Reviewed: 06/18/2020 Elsevier Patient Education  2021 [employer] Inc.   Bone Health Bones protect organs, store calcium, anchor muscles, and support the whole body. Keeping your bones strong is important, especially as you get older. You can take actions to help keep your bones strong and healthy. Why is keeping my bones healthy important? Keeping your bones healthy is important because your body constantly replaces bone cells. Cells get old, and new cells take their place. As we age, we lose bone cells because the body may not be able to make   enough new cells to replace the old cells. The amount of bone cells and bone tissue you have is referred to as bone mass. The higher your bone mass, the stronger your bones. The aging process leads to an overall loss of bone mass in the body, which can increase the likelihood of:  Joint pain and stiffness.  Broken bones.  A condition in which the bones become weak and brittle (osteoporosis). A large decline in bone mass occurs in older adults. In women, it occurs about the time of menopause.   What actions can I take to keep my bones healthy? Good health habits are important for maintaining healthy bones. This includes eating nutritious foods and exercising regularly. To have healthy bones, you need to get enough of the right minerals and vitamins. Most nutrition experts recommend getting these nutrients from the foods that you eat. In some cases, taking supplements may also be recommended. Doing certain types of exercise is also important for bone health. What are the nutritional recommendations for healthy bones? Eating a well-balanced diet with plenty of calcium and vitamin D will help to protect your bones. Nutritional recommendations vary  from person to person. Ask your health care provider what is healthy for you. Here are some general guidelines. Get enough calcium Calcium is the most important (essential) mineral for bone health. Most people can get enough calcium from their diet, but supplements may be recommended for people who are at risk for osteoporosis. Good sources of calcium include:  Dairy products, such as low-fat or nonfat milk, cheese, and yogurt.  Dark green leafy vegetables, such as bok choy and broccoli.  Calcium-fortified foods, such as orange juice, cereal, bread, soy beverages, and tofu products.  Nuts, such as almonds. Follow these recommended amounts for daily calcium intake:  Children, age 1-3: 700 mg.  Children, age 4-8: 1,000 mg.  Children, age 9-13: 1,300 mg.  Teens, age 14-18: 1,300 mg.  Adults, age 19-50: 1,000 mg.  Adults, age 51-70: ? Men: 1,000 mg. ? Women: 1,200 mg.  Adults, age 71 or older: 1,200 mg.  Pregnant and breastfeeding females: ? Teens: 1,300 mg. ? Adults: 1,000 mg. Get enough vitamin D Vitamin D is the most essential vitamin for bone health. It helps the body absorb calcium. Sunlight stimulates the skin to make vitamin D, so be sure to get enough sunlight. If you live in a cold climate or you do not get outside often, your health care provider may recommend that you take vitamin D supplements. Good sources of vitamin D in your diet include:  Egg yolks.  Saltwater fish.  Milk and cereal fortified with vitamin D. Follow these recommended amounts for daily vitamin D intake:  Children and teens, age 1-18: 600 international units.  Adults, age 50 or younger: 400-800 international units.  Adults, age 51 or older: 800-1,000 international units. Get other important nutrients Other nutrients that are important for bone health include:  Phosphorus. This mineral is found in meat, poultry, dairy foods, nuts, and legumes. The recommended daily intake for adult men and  adult women is 700 mg.  Magnesium. This mineral is found in seeds, nuts, dark green vegetables, and legumes. The recommended daily intake for adult men is 400-420 mg. For adult women, it is 310-320 mg.  Vitamin K. This vitamin is found in green leafy vegetables. The recommended daily intake is 120 mg for adult men and 90 mg for adult women.   What type of physical activity is best for building   and maintaining healthy bones? Weight-bearing and strength-building activities are important for building and maintaining healthy bones. Weight-bearing activities cause muscles and bones to work against gravity. Strength-building activities increase the strength of the muscles that support bones. Weight-bearing and muscle-building activities include:  Walking and hiking.  Jogging and running.  Dancing.  Gym exercises.  Lifting weights.  Tennis and racquetball.  Climbing stairs.  Aerobics. Adults should get at least 30 minutes of moderate physical activity on most days. Children should get at least 60 minutes of moderate physical activity on most days. Ask your health care provider what type of exercise is best for you.   How can I find out if my bone mass is low? Bone mass can be measured with an X-ray test called a bone mineral density (BMD) test. This test is recommended for all women who are age 65 or older. It may also be recommended for:  Men who are age 70 or older.  People who are at risk for osteoporosis because of: ? Having bones that break easily. ? Having a long-term disease that weakens bones, such as kidney disease or rheumatoid arthritis. ? Having menopause earlier than normal. ? Taking medicine that weakens bones, such as steroids, thyroid hormones, or hormone treatment for breast cancer or prostate cancer. ? Smoking. ? Drinking three or more alcoholic drinks a day. If you find that you have a low bone mass, you may be able to prevent osteoporosis or further bone loss by  changing your diet and lifestyle. Where can I find more information? For more information, check out the following websites:  National Osteoporosis Foundation: www.nof.org/patients  National Institutes of Health: www.bones.nih.gov  International Osteoporosis Foundation: www.iofbonehealth.org Summary  The aging process leads to an overall loss of bone mass in the body, which can increase the likelihood of broken bones and osteoporosis.  Eating a well-balanced diet with plenty of calcium and vitamin D will help to protect your bones.  Weight-bearing and strength-building activities are also important for building and maintaining strong bones.  Bone mass can be measured with an X-ray test called a bone mineral density (BMD) test. This information is not intended to replace advice given to you by your health care provider. Make sure you discuss any questions you have with your health care provider. Document Revised: 10/02/2017 Document Reviewed: 10/02/2017 Elsevier Patient Education  2021 Elsevier Inc.   

## 2021-01-21 ENCOUNTER — Ambulatory Visit
Admission: RE | Admit: 2021-01-21 | Discharge: 2021-01-21 | Disposition: A | Discharge status: Home / Self Care | Payer: BC Managed Care – PPO | Source: Ambulatory Visit | Attending: Neurology | Admitting: Neurology

## 2021-01-21 ENCOUNTER — Other Ambulatory Visit: Payer: Self-pay

## 2021-01-21 DIAGNOSIS — G379 Demyelinating disease of central nervous system, unspecified: Secondary | ICD-10-CM | POA: Insufficient documentation

## 2021-01-21 IMAGING — MR MR HEAD W/O CM
12 series · 48 of 48 positions shown · non-contrast
Comparison: CT head [DATE]

CLINICAL DATA: Demyelinating disease.

EXAM:
MRI HEAD WITHOUT CONTRAST
TECHNIQUE: Multiplanar, multiecho pulse sequences of the brain and surrounding
structures were obtained without intravenous contrast.

[Series 5: ax dwi_tracew · axial · 3.0mm · 0.65mm/px · z∈[-96,+59]mm · 3 of 48 slices shown]
[im 1/48]
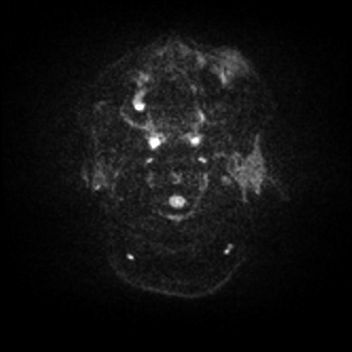
[im 24/48]
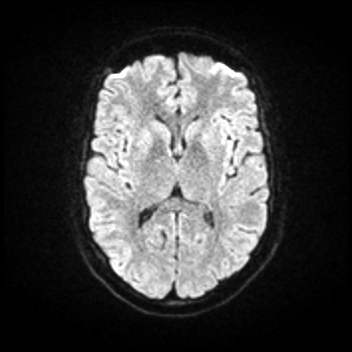
[im 48/48]
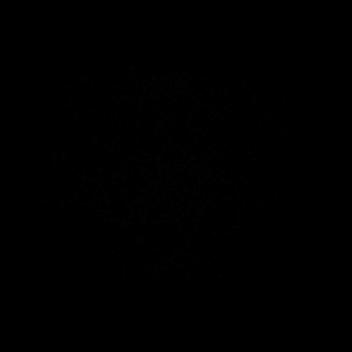

[Series 6: ax dwi_adc · axial · 3.0mm · 0.65mm/px · z∈[-96,+56]mm · 4 of 47 slices shown]
[im 1/47]
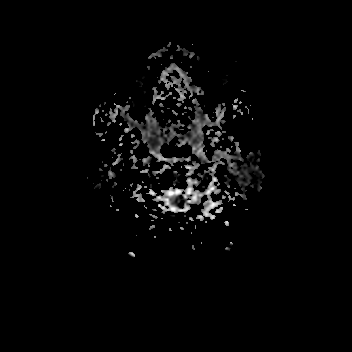
[im 16/47]
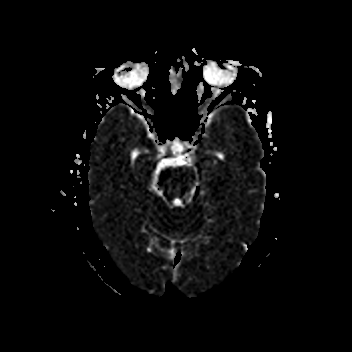
[im 31/47]
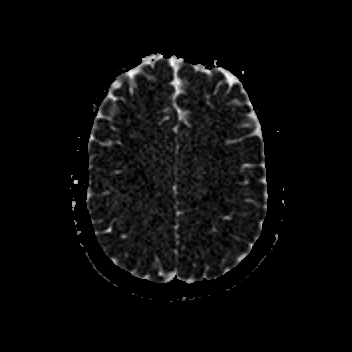
[im 47/47]
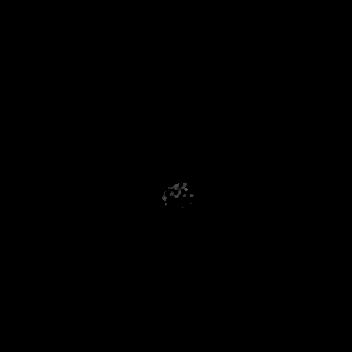

[Series 7: cor dwi_tracew · coronal · 5.0mm · 0.65mm/px · 3 of 40 slices shown]
[im 1/40]
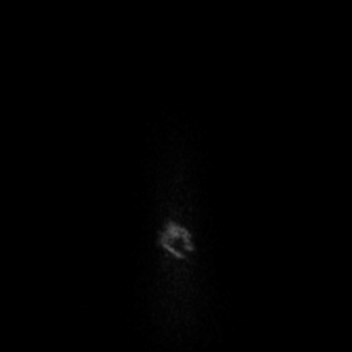
[im 20/40]
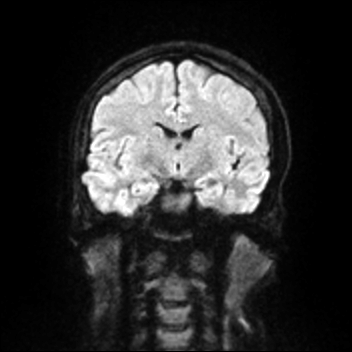
[im 40/40]
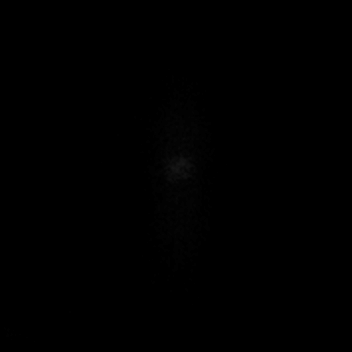

[Series 8: cor dwi_adc · coronal · 5.0mm · 0.65mm/px · 3 of 37 slices shown]
[im 1/37]
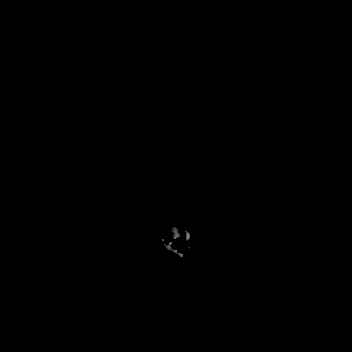
[im 19/37]
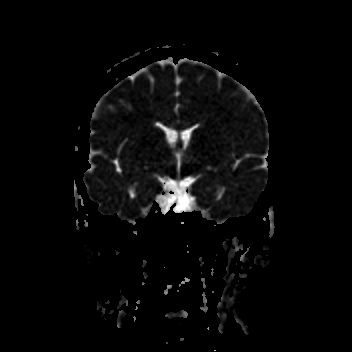
[im 37/37]
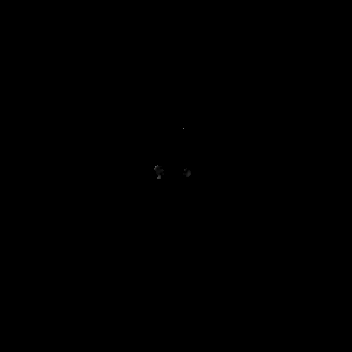

[Series 9: T1 · sagittal · 5.0mm · 0.62mm/px · 2 of 25 slices shown (1 of 2)]
[im 1/25]
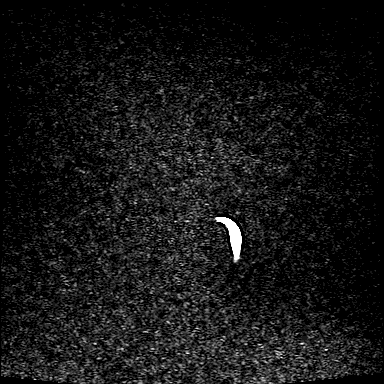
[im 25/25]
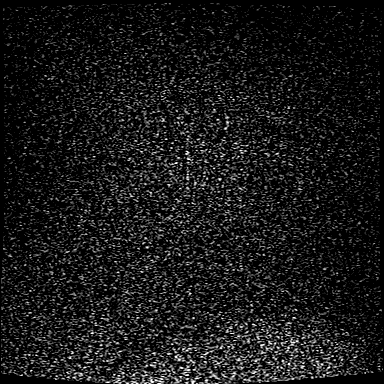

[Series 10: FLAIR · sagittal · 5.0mm · 0.94mm/px · 2 of 25 slices shown (1 of 2)]
[im 1/25]
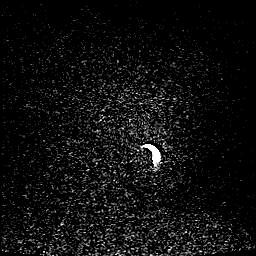
[im 25/25]
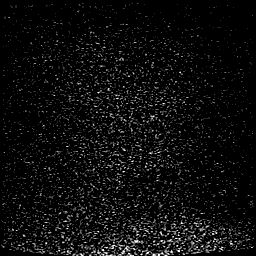

[Series 11: T2 · axial · 5.0mm · 0.53mm/px · z∈[-95,+49]mm · 2 of 25 slices shown (1 of 2)]
[im 1/25]
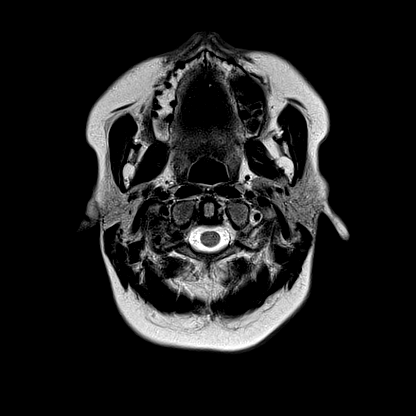
[im 25/25]
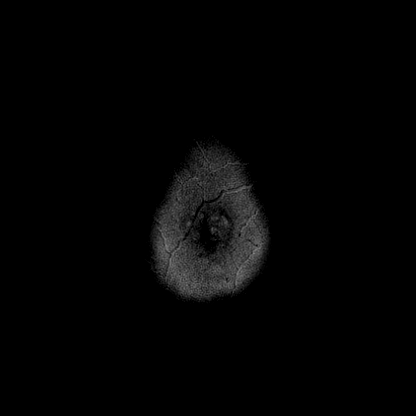

[Series 13: pha_images · axial · 3.0mm · 0.90mm/px · z∈[-111,+57]mm · 4 of 57 slices shown]
[im 1/57]
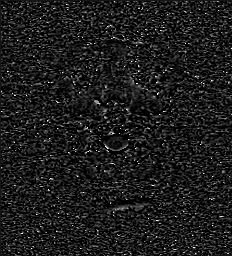
[im 19/57]
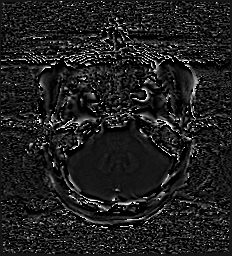
[im 38/57]
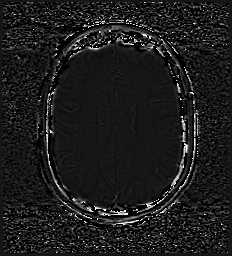
[im 57/57]
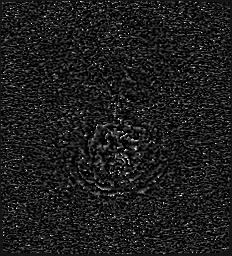

[Series 14: swi_images · axial · 3.0mm · 0.90mm/px · z∈[-111,+66]mm · 5 of 60 slices shown]
[im 1/60]
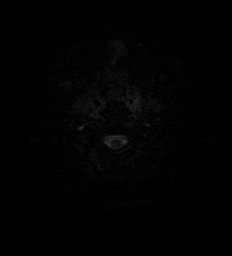
[im 15/60]
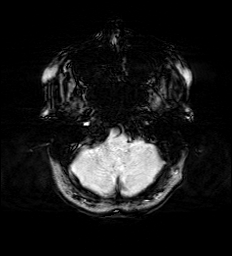
[im 30/60]
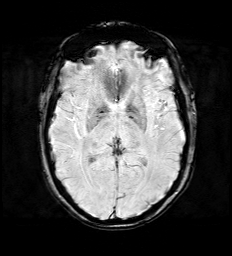
[im 45/60]
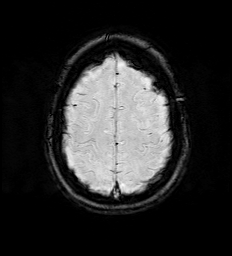
[im 60/60]
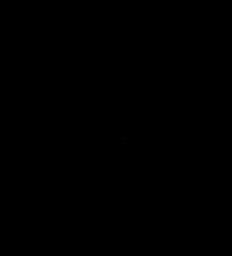

[Series 16: FLAIR · axial · 3.0mm · 0.53mm/px · z∈[-104,+58]mm · 4 of 55 slices shown (2 of 2)]
[im 1/55]
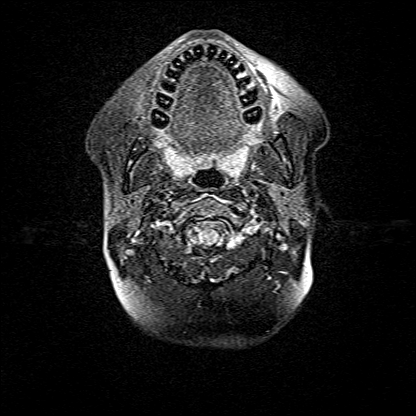
[im 19/55]
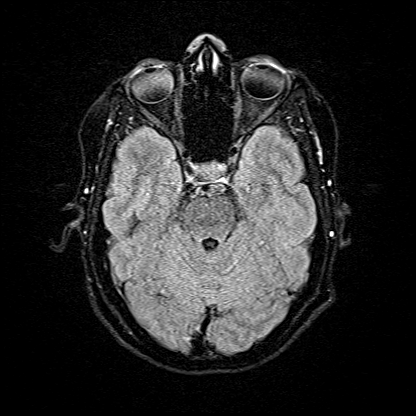
[im 37/55]
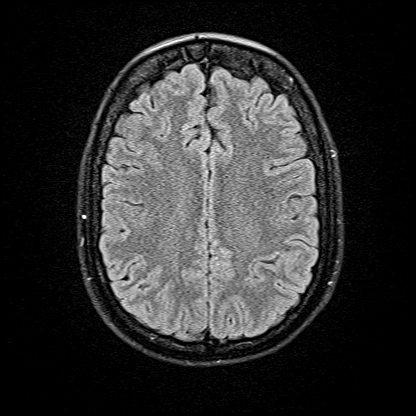
[im 55/55]
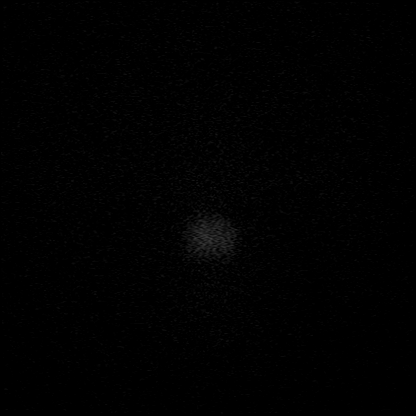

[Series 17: T1 · axial · 1.0mm · 0.98mm/px · z∈[-110,+65]mm · 14 of 176 slices shown (2 of 2)]
[im 1/176]
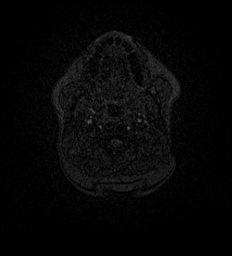
[im 14/176]
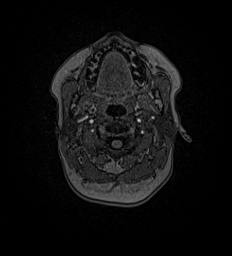
[im 27/176]
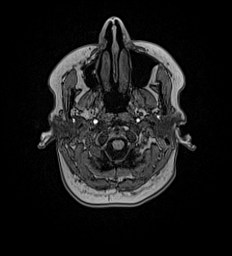
[im 41/176]
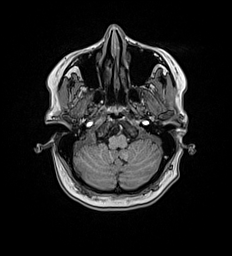
[im 54/176]
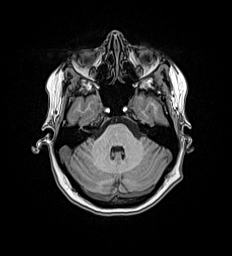
[im 68/176]
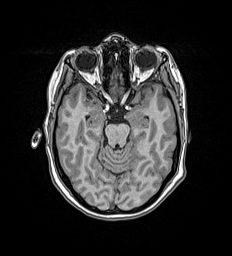
[im 81/176]
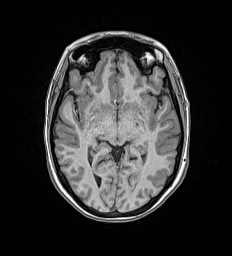
[im 95/176]
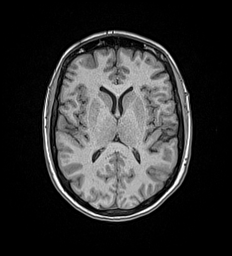
[im 108/176]
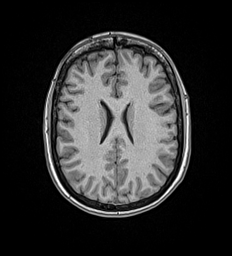
[im 122/176]
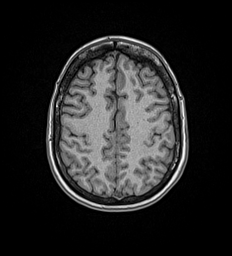
[im 135/176]
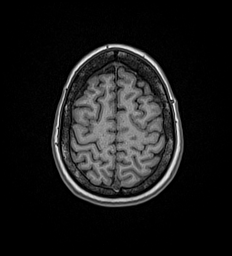
[im 149/176]
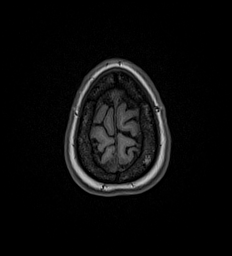
[im 162/176]
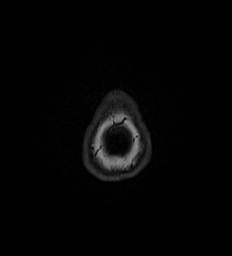
[im 176/176]
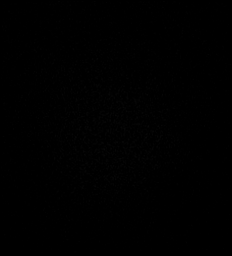

[Series 18: T2 · coronal · 5.0mm · 0.57mm/px · 2 of 29 slices shown (2 of 2)]
[im 1/29]
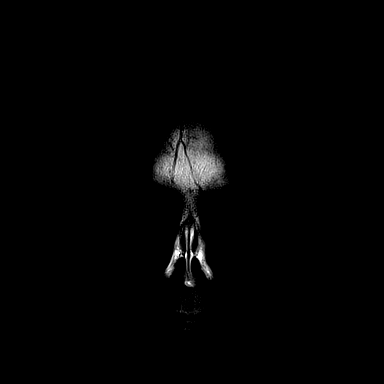
[im 29/29]
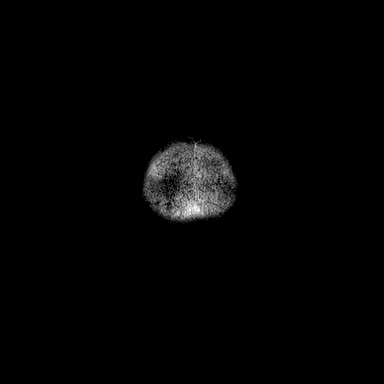

[48 of 48 positions shown; findings below may reference images not displayed]

FINDINGS: Brain: No acute infarction, hemorrhage, hydrocephalus, extra-axial
collection or mass lesion. Normal white matter. No evidence of
demyelinating disease.

Vascular: Normal arterial flow voids.

Skull and upper cervical spine: Negative

Sinuses/Orbits: Negative

Other: None
IMPRESSION: Normal MRI of the brain.  Normal white matter.

## 2021-02-18 ENCOUNTER — Ambulatory Visit
Admission: RE | Admit: 2021-02-18 | Discharge: 2021-02-18 | Disposition: A | Discharge status: Home / Self Care | Payer: BC Managed Care – PPO | Source: Ambulatory Visit | Attending: Obstetrics and Gynecology | Admitting: Obstetrics and Gynecology

## 2021-02-18 ENCOUNTER — Other Ambulatory Visit (INDEPENDENT_AMBULATORY_CARE_PROVIDER_SITE_OTHER): Payer: BC Managed Care – PPO

## 2021-02-18 ENCOUNTER — Other Ambulatory Visit: Payer: Self-pay

## 2021-02-18 DIAGNOSIS — N6012 Diffuse cystic mastopathy of left breast: Secondary | ICD-10-CM | POA: Diagnosis not present

## 2021-02-18 DIAGNOSIS — Z9189 Other specified personal risk factors, not elsewhere classified: Secondary | ICD-10-CM | POA: Diagnosis not present

## 2021-02-18 DIAGNOSIS — E039 Hypothyroidism, unspecified: Secondary | ICD-10-CM

## 2021-02-18 DIAGNOSIS — Z803 Family history of malignant neoplasm of breast: Secondary | ICD-10-CM | POA: Diagnosis not present

## 2021-02-18 DIAGNOSIS — N6011 Diffuse cystic mastopathy of right breast: Secondary | ICD-10-CM | POA: Diagnosis not present

## 2021-02-18 LAB — TSH: TSH: 0.75 u[IU]/mL (ref 0.35–4.50)

## 2021-02-18 IMAGING — MR MR BREAST BILAT WO/W CM
2 of 12 series · 6 of 48 positions shown · IV contrast (gadavist)
Comparison: Breast MRI [DATE]. Prior mammograms and breast
ultrasounds, most recently [DATE].

CLINICAL DATA: 39-year-old with an estimated 21.9% lifetime risk of
breast cancer, BRCA negative, family history of breast cancer in her
paternal grandmother and 2 paternal aunts. High risk supplemental
screening MRI.

LABS:  Not applicable.
EXAM:
BILATERAL BREAST MRI WITH AND WITHOUT CONTRAST
TECHNIQUE: Multiplanar, multisequence MR images of both breasts were obtained
prior to and following the intravenous administration of 6 ml of
Gadavist.

[Series 2: T1 · axial · B · 1.5mm · 0.97mm/px · z∈[-83,+83]mm · 5 of 112 slices shown]
[im 1/112]
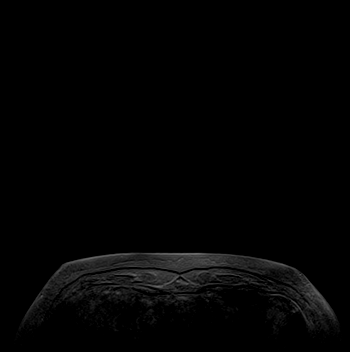
[im 28/112]
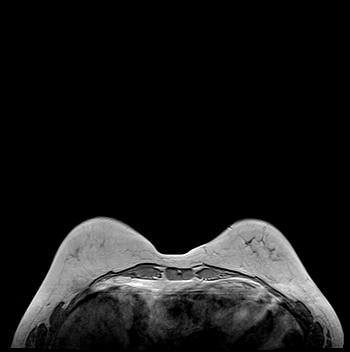
[im 56/112]
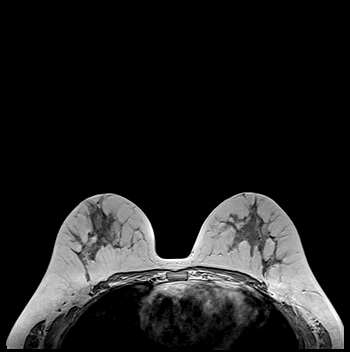
[im 84/112]
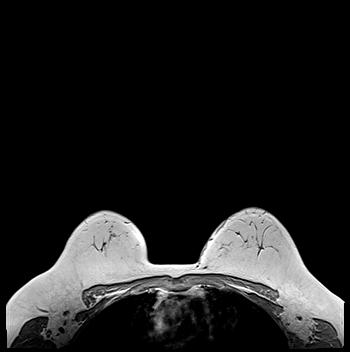
[im 112/112]
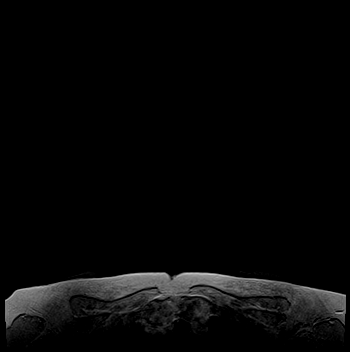

[Series 3: T2 · axial · B · 3.0mm · 0.97mm/px · 1 of 46 slices shown]
[im 1/46]
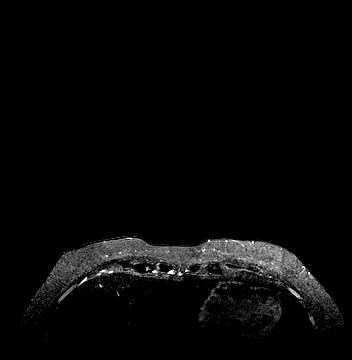

[6 of 48 positions shown; findings below may reference images not displayed]

Three-dimensional MR images were rendered by post-processing of the
original MR data on an independent workstation. The
three-dimensional MR images were interpreted, and findings are
reported in the following complete MRI report for this study. Three
dimensional images were evaluated at the independent interpreting
workstation using the DynaCAD thin client.
FINDINGS: Breast composition: c. Heterogeneous fibroglandular tissue.

Background parenchymal enhancement: Moderate to marked.

Right breast: No suspicious mass or abnormal enhancement. Scattered
benign T1 hypointense and T2 hyperintense cysts.

Left breast: No suspicious mass or abnormal enhancement. Scattered
benign T1 hypointense and T2 hyperintense cysts.

Lymph nodes: No pathologic lymphadenopathy.

Ancillary findings: Approximate 3.0 cm T1 hypointense and T2
hyperintense cyst involving the LEFT lobe of the liver, unchanged
since the [DATE] MRI.
IMPRESSION: 1. No MRI evidence of malignancy involving either breast.
2. Stable 3 cm benign cyst involving the LEFT lobe of the liver.

RECOMMENDATION:
1. Annual BILATERAL screening mammography which is due in [DATE].
2. Supplemental high risk screening MRI in 1 year as long as the
patient's estimated lifetime risk of breast cancer remains greater
than 20%.

BI-RADS CATEGORY  2: Benign.

## 2021-02-18 MED ORDER — GADOBUTROL 1 MMOL/ML IV SOLN
6.0000 mL | Freq: Once | INTRAVENOUS | Status: AC | PRN
  Administered 2021-02-18: 6 mL via INTRAVENOUS

## 2021-03-08 ENCOUNTER — Ambulatory Visit: Payer: BC Managed Care – PPO | Admitting: Family Medicine

## 2021-03-19 DIAGNOSIS — R202 Paresthesia of skin: Secondary | ICD-10-CM | POA: Diagnosis not present

## 2021-03-19 DIAGNOSIS — F411 Generalized anxiety disorder: Secondary | ICD-10-CM | POA: Diagnosis not present

## 2021-03-19 DIAGNOSIS — R519 Headache, unspecified: Secondary | ICD-10-CM | POA: Diagnosis not present

## 2021-03-19 DIAGNOSIS — R200 Anesthesia of skin: Secondary | ICD-10-CM | POA: Diagnosis not present

## 2021-03-24 ENCOUNTER — Other Ambulatory Visit: Payer: Self-pay | Admitting: Endocrinology

## 2021-03-24 ENCOUNTER — Encounter: Payer: Self-pay | Admitting: Endocrinology

## 2021-03-24 DIAGNOSIS — E039 Hypothyroidism, unspecified: Secondary | ICD-10-CM

## 2021-03-25 ENCOUNTER — Other Ambulatory Visit: Payer: Self-pay

## 2021-03-25 ENCOUNTER — Other Ambulatory Visit (INDEPENDENT_AMBULATORY_CARE_PROVIDER_SITE_OTHER): Payer: BC Managed Care – PPO

## 2021-03-25 DIAGNOSIS — E039 Hypothyroidism, unspecified: Secondary | ICD-10-CM

## 2021-03-26 ENCOUNTER — Other Ambulatory Visit (INDEPENDENT_AMBULATORY_CARE_PROVIDER_SITE_OTHER): Payer: BC Managed Care – PPO

## 2021-03-26 ENCOUNTER — Other Ambulatory Visit: Payer: BC Managed Care – PPO

## 2021-03-26 ENCOUNTER — Ambulatory Visit: Payer: BC Managed Care – PPO | Admitting: Endocrinology

## 2021-03-26 ENCOUNTER — Other Ambulatory Visit: Payer: Self-pay

## 2021-03-26 VITALS — BP 114/70 | HR 83 | Ht 60.0 in | Wt 140.4 lb

## 2021-03-26 DIAGNOSIS — R202 Paresthesia of skin: Secondary | ICD-10-CM | POA: Diagnosis not present

## 2021-03-26 DIAGNOSIS — E039 Hypothyroidism, unspecified: Secondary | ICD-10-CM

## 2021-03-26 LAB — VITAMIN B12: Vitamin B-12: 922 pg/mL — ABNORMAL HIGH (ref 211–911)

## 2021-03-26 LAB — T4, FREE: Free T4: 1.08 ng/dL (ref 0.60–1.60)

## 2021-03-26 LAB — TSH: TSH: 1 u[IU]/mL (ref 0.35–5.50)

## 2021-03-26 NOTE — Progress Notes (Signed)
Subjective:    Patient ID: Sarah Moran, female    DOB: 07-Jun-1981, 40 y.o.   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  Path showed 2 foci PTC, with max diameter 2 mm.   2/07: RAI, 100 mCi 3/19: TG undetectable (Ab neg).  3/20: TG undetectable (Ab neg).  Denies neck swelling.  Vit-D def: She takes non-rx vit-D, 2000 units/day.  Postsurgical hypothyroidism: she she has been on her current 88 mcg/d, since 5/22; she has 18 mos of diffuse body pain, sensation of head vibrating, and paresthesias.  Pain is better, but she now has lightheadedness.  She has not yet started clonazepam.  She is off Lexapro and buspar.   Past Medical History:  Diagnosis Date   Allergy    Anal fissure    Anxiety    no meds   Chronic pelvic pain in female    Family history of breast cancer    My Risk neg 5/18   Family history of cervical cancer    Family history of skin cancer    Family history of throat cancer    Frequent headaches    Gallstone    Gallstones    Genetic testing of female 01/2017   My Risk/BRCA neg   GERD (gastroesophageal reflux disease)    well controlled. Takes gingerroot when symptoms   Hypothyroidism    s/p thyroidectomy - levothyroxine 75 mcg daily   IBS (irritable bowel syndrome)    constipation - takes miralax daily   Increased risk of breast cancer 01/2017   IBIS=18.8%/riskscore=21.9%   Liver cyst 04/2018   Migraine    takes excedrin. She has tried imitrex in the past    Personal history of radiation therapy    Seizure ([place]) 1997    x 1-age 53-after surgery for scoliosis   Thyroid cancer ([place]) 2007   s/p thyroidectomy    Past Surgical History:  Procedure Laterality Date   ANAL RECTAL MANOMETRY N/A 10/12/2018   Procedure: ANO RECTAL MANOMETRY;  Surgeon: Sarah Pole, MD;  Location: [facility];  Service: Endoscopy;  Laterality: N/A;   ANTERIOR AND  POSTERIOR REPAIR WITH SACROSPINOUS FIXATION N/A 08/02/2018   Procedure: ANTERIOR AND POSTERIOR REPAIR;  Surgeon: Sarah Fellers, MD;  Location: [facility];  Service: Gynecology;  Laterality: N/A;   BACK SURGERY  1997   scoliosis   CHOLECYSTECTOMY N/A 09/21/2018   Procedure: LAPAROSCOPIC CHOLECYSTECTOMY;  Surgeon: Sarah Maudlin, MD;  Location: [facility];  Service: General;  Laterality: N/A;   COLONOSCOPY     ESOPHAGOGASTRODUODENOSCOPY (EGD) WITH PROPOFOL N/A 06/12/2018   Procedure: ESOPHAGOGASTRODUODENOSCOPY (EGD) WITH PROPOFOL;  Surgeon: Sarah Landsman, MD;  Location: [facility];  Service: Gastroenterology;  Laterality: N/A;   HERNIA REPAIR  1990   bilateral inguinal   PUBOVAGINAL SLING N/A 08/02/2018   Procedure: PUBO-VAGINAL SLING- TVT;  Surgeon: Sarah Fellers, MD;  Location: [facility];  Service: Gynecology;  Laterality: N/A;   THYROIDECTOMY  2007   thyroid cancer   TOTAL ABDOMINAL HYSTERECTOMY  2008   dymenorrhea    Social History   Socioeconomic History   Marital status: Married    Spouse name: Sarah Moran   Number of children: 2   Years of education: 12   Highest education level: Not on file  Occupational History   Occupation: Stay at home mom  Tobacco Use   Smoking status: Never  Smokeless tobacco: Never  Vaping Use   Vaping Use: Never used  Substance and Sexual Activity   Alcohol use: Yes   Drug use: No   Sexual activity: Yes    Birth control/protection: Surgical    Comment: Hysterectomy  Other Topics Concern   Not on file  Social History Narrative   Sarah Moran grew up in [place]. She currently lives in [place] with her husband, Sarah Moran, and their 2 sons ([place]  and [place] ). 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 Sarah Moran belongs to the [employer]. She enjoys outdoor activities on her spare time.   Social Determinants of Health   Financial Resource Strain: Not on file  Food Insecurity:  Not on file  Transportation Needs: Not on file  Physical Activity: Not on file  Stress: Not on file  Social Connections: Not on file  Intimate Partner Violence: Not on file    Current Outpatient Medications on File Prior to Visit  Medication Sig Dispense Refill   azelastine (ASTELIN) 0.1 % nasal spray Place 2 sprays into both nostrils 2 (two) times daily. Use in each nostril as directed 30 mL 12   Cholecalciferol (VITAMIN D3) 50 MCG (2000 UT) capsule Take 2,000 Units by mouth daily.     Cod Liver Oil CAPS Take 1 capsule by mouth at bedtime.      diclofenac Sodium (VOLTAREN) 1 % GEL Apply 2 g topically 4 (four) times daily.     LINZESS 290 MCG CAPS capsule TAKE 1 CAPSULE (290 MCG TOTAL) BY MOUTH 2 (TWO) TIMES DAILY BEFORE A MEAL 180 capsule 3   Multiple Vitamin (MULTI-VITAMIN) tablet Take by mouth.     SUMAtriptan (IMITREX) 50 MG tablet TAKE ONE TABLET BY MOUTH AT ONSET OF MIGRAINE - MAY REPEAT DOSE IN 2 HOURS IF HEADACHE PERSISTS 10 tablet 2   SYNTHROID 88 MCG tablet TAKE 1 TABLET BY MOUTH DAILY BEFORE BREAKFAST. 90 tablet 0   No current facility-administered medications on file prior to visit.    Allergies  Allergen Reactions   Darvon [Propoxyphene] Other (See Comments)    Hallucinations    Onion Other (See Comments)    Migraines   Percocet [Oxycodone-Acetaminophen] Hives   Vicodin [Hydrocodone-Acetaminophen] Hives    Family History  Problem Relation Age of Onset   Hyperlipidemia Father    Hypertension Father    Clotting disorder Sister    Hepatitis B Brother    Breast cancer Paternal Grandmother 72       with mets   Cervical cancer Mother        dx. in her early 27s   Breast cancer Paternal Aunt 59   Throat cancer Paternal Aunt 45   Breast cancer Paternal Aunt 47   Breast cancer Paternal Aunt        90   Skin cancer Paternal Aunt    Cancer Cousin        unknown type dx. in her late 20s/early 60s   Skin cancer Cousin        13 cancerous moles removed in her 39s     BP 114/70 (BP Location: Right Arm, Patient Position: Sitting, Cuff Size: Normal)   Pulse 83   Ht 5' (1.524 m)   Wt 140 lb 6.4 oz (63.7 kg)   SpO2 98%   BMI 27.42 kg/m    Review of Systems She also has headache (medrol helped this, but not the lightheadedness).  Objective:   Physical Exam GENERAL: no distress Neck: a healed scar is present.  I do not appreciate a nodule in the thyroid or elsewhere in the neck.    Lab Results  Component Value Date   PTH 28 12/12/2019   CALCIUM 9.6 09/24/2020   MRI brain: normal.   B-12=922    Assessment & Plan:  B-12 def: slightly overcontrolled.  I advise reduce B-12 supplement Hypothyroidism: well-controlled.    Patient Instructions  Please continue the same synthroid.   I have asked to see if [facility] can add B-12 with the blood they have from yesterday.   Please come back for a follow-up appointment in 1 year.

## 2021-03-26 NOTE — Patient Instructions (Addendum)
Please continue the same synthroid.   I have asked to see if [facility] can add B-12 with the blood they have from yesterday.   Please come back for a follow-up appointment in 1 year.

## 2021-04-02 DIAGNOSIS — D2262 Melanocytic nevi of left upper limb, including shoulder: Secondary | ICD-10-CM | POA: Diagnosis not present

## 2021-04-02 DIAGNOSIS — D225 Melanocytic nevi of trunk: Secondary | ICD-10-CM | POA: Diagnosis not present

## 2021-04-02 DIAGNOSIS — D2272 Melanocytic nevi of left lower limb, including hip: Secondary | ICD-10-CM | POA: Diagnosis not present

## 2021-04-02 DIAGNOSIS — D2261 Melanocytic nevi of right upper limb, including shoulder: Secondary | ICD-10-CM | POA: Diagnosis not present

## 2021-04-13 ENCOUNTER — Other Ambulatory Visit: Payer: Self-pay

## 2021-04-13 ENCOUNTER — Ambulatory Visit: Payer: BC Managed Care – PPO | Admitting: Family Medicine

## 2021-04-13 VITALS — BP 100/60 | HR 85 | Temp 98.4°F | Ht 60.0 in | Wt 139.0 lb

## 2021-04-13 DIAGNOSIS — G43909 Migraine, unspecified, not intractable, without status migrainosus: Secondary | ICD-10-CM

## 2021-04-13 DIAGNOSIS — E039 Hypothyroidism, unspecified: Secondary | ICD-10-CM | POA: Diagnosis not present

## 2021-04-13 DIAGNOSIS — J309 Allergic rhinitis, unspecified: Secondary | ICD-10-CM

## 2021-04-13 MED ORDER — MONTELUKAST SODIUM 10 MG PO TABS: 10.0000 mg | ORAL_TABLET | Freq: Every day | ORAL | 3 refills | Status: DC

## 2021-04-13 NOTE — Patient Instructions (Addendum)
Nice to see you. We will start you on Singulair.  If this does not help with the vertigo or allergy symptoms please let me know and I can send you to ENT. I have referred you to endocrinology at [facility].

## 2021-04-13 NOTE — Progress Notes (Signed)
Sarah Rumps, MD Phone: [phone]  Sarah Moran is a 40 y.o. female who presents today for follow-up.  Hypothyroidism: Patient is taking Synthroid.  She is not taking anything within 4 hours of this.  No skin changes.  No heat or cold intolerance.  She saw endocrinology recently though she would like to transfer her care to a different endocrinologist.  Recent TSH was acceptable.  Vertigo/allergic rhinitis: Patient notes 3 episodes of vertigo.  The first 1 occurred on June 13.  She was standing up.  It lasted for about 5 minutes and resolved on its own.  Each time she has had vertigo it has occurred when she is standing.  1 time it occurred with looking down and then looking up.  She notes nausea with the vertigo though no vomiting.  She does note chronic issues with ear pressure and chronic intermittent tinnitus.  She has chronic allergic rhinitis symptoms with rhinorrhea almost anytime she goes outside.  She is using Nasacort and she recently switched Xyzal to Zyrtec.  She has noted improvement in her ear pressure symptoms since going on the Zyrtec.  She was on Astelin previously with no benefit.  She reports a negative Dix-Hallpike at home.  Chronic headaches: She notes her headaches improved with switching over to Zyrtec.  She had a MRI brain recently that was normal.  Her neurologist prescribed a Medrol Dosepak and clonazepam the patient does not want to take clonazepam.  Social History   Tobacco Use  Smoking Status Never  Smokeless Tobacco Never    Current Outpatient Medications on File Prior to Visit  Medication Sig Dispense Refill   azelastine (ASTELIN) 0.1 % nasal spray Place 2 sprays into both nostrils 2 (two) times daily. Use in each nostril as directed 30 mL 12   Cholecalciferol (VITAMIN D3) 50 MCG (2000 UT) capsule Take 2,000 Units by mouth daily.     Cod Liver Oil CAPS Take 1 capsule by mouth at bedtime.      diclofenac Sodium (VOLTAREN) 1 % GEL Apply 2 g topically 4  (four) times daily.     LINZESS 290 MCG CAPS capsule TAKE 1 CAPSULE (290 MCG TOTAL) BY MOUTH 2 (TWO) TIMES DAILY BEFORE A MEAL 180 capsule 3   Multiple Vitamin (MULTI-VITAMIN) tablet Take by mouth.     SUMAtriptan (IMITREX) 50 MG tablet TAKE ONE TABLET BY MOUTH AT ONSET OF MIGRAINE - MAY REPEAT DOSE IN 2 HOURS IF HEADACHE PERSISTS 10 tablet 2   SYNTHROID 88 MCG tablet TAKE 1 TABLET BY MOUTH DAILY BEFORE BREAKFAST. 90 tablet 0   No current facility-administered medications on file prior to visit.     ROS see history of present illness  Objective  Physical Exam Vitals:   04/13/21 0857  BP: 100/60  Pulse: 85  Temp: 98.4 F (36.9 C)  SpO2: 99%    BP Readings from Last 3 Encounters:  04/13/21 100/60  03/26/21 114/70  01/18/21 100/62   Wt Readings from Last 3 Encounters:  04/13/21 139 lb (63 kg)  03/26/21 140 lb 6.4 oz (63.7 kg)  01/18/21 138 lb (62.6 kg)    Physical Exam Constitutional:      General: She is not in acute distress.    Appearance: She is not diaphoretic.  HENT:     Right Ear: Tympanic membrane normal.     Left Ear: Tympanic membrane normal.  Cardiovascular:     Rate and Rhythm: Normal rate and regular rhythm.     Heart sounds:  Normal heart sounds.  Pulmonary:     Effort: Pulmonary effort is normal.     Breath sounds: Normal breath sounds.  Skin:    General: Skin is warm and dry.  Neurological:     Mental Status: She is alert.     Assessment/Plan: Please see individual problem list.  Problem List Items Addressed This Visit     Allergic rhinitis - Primary    This seems to be more of an issue recently.  I suspect this is leading to her vertiginous symptoms.  She did have a reassuring MRI recently.  She will continue Nasacort and Zyrtec.  We will start on Singulair 10 mg once daily.  If this is not helpful for the vertigo or or the allergy symptoms.  She will let us know we can refer to ENT.       Relevant Medications   montelukast (SINGULAIR) 10  MG tablet   Hypothyroidism    Adequate control.  We will refer her to a different endocrinologist at her request.  She will continue Synthroid 88 mcg once daily.       Relevant Orders   Ambulatory referral to Endocrinology   Migraine    Chronic intermittent issues with this.  These have improved with switching over to Zyrtec.  I suspect her allergies are driving a lot of her headaches.  She will not take clonazepam.  She will monitor.        Return in about 3 months (around 07/14/2021) for cpe.  This visit occurred during the SARS-CoV-2 public health emergency.  Safety protocols were in place, including screening questions prior to the visit, additional usage of staff PPE, and extensive cleaning of exam room while observing appropriate contact time as indicated for disinfecting solutions.    Sarah Rumps, MD [place]

## 2021-04-13 NOTE — Assessment & Plan Note (Addendum)
Adequate control.  We will refer her to a different endocrinologist at her request.  She will continue Synthroid 88 mcg once daily.

## 2021-04-13 NOTE — Assessment & Plan Note (Signed)
Chronic intermittent issues with this.  These have improved with switching over to Zyrtec.  I suspect her allergies are driving a lot of her headaches.  She will not take clonazepam.  She will monitor.

## 2021-04-13 NOTE — Assessment & Plan Note (Signed)
This seems to be more of an issue recently.  I suspect this is leading to her vertiginous symptoms.  She did have a reassuring MRI recently.  She will continue Nasacort and Zyrtec.  We will start on Singulair 10 mg once daily.  If this is not helpful for the vertigo or or the allergy symptoms.  She will let us know we can refer to ENT.

## 2021-04-14 ENCOUNTER — Encounter: Payer: Self-pay | Admitting: Family Medicine

## 2021-04-14 DIAGNOSIS — J309 Allergic rhinitis, unspecified: Secondary | ICD-10-CM

## 2021-04-26 ENCOUNTER — Other Ambulatory Visit: Payer: Self-pay | Admitting: Gastroenterology

## 2021-06-04 ENCOUNTER — Ambulatory Visit: Payer: BC Managed Care – PPO | Admitting: Allergy & Immunology

## 2021-06-04 ENCOUNTER — Encounter: Payer: Self-pay | Admitting: Allergy & Immunology

## 2021-06-04 ENCOUNTER — Other Ambulatory Visit: Payer: Self-pay

## 2021-06-04 VITALS — BP 108/70 | HR 71 | Temp 98.0°F | Resp 16 | Ht 60.0 in | Wt 141.0 lb

## 2021-06-04 DIAGNOSIS — J302 Other seasonal allergic rhinitis: Secondary | ICD-10-CM

## 2021-06-04 DIAGNOSIS — J3089 Other allergic rhinitis: Secondary | ICD-10-CM | POA: Diagnosis not present

## 2021-06-04 DIAGNOSIS — J331 Polypoid sinus degeneration: Secondary | ICD-10-CM | POA: Insufficient documentation

## 2021-06-04 DIAGNOSIS — G43809 Other migraine, not intractable, without status migrainosus: Secondary | ICD-10-CM | POA: Diagnosis not present

## 2021-06-04 MED ORDER — LEVOCETIRIZINE DIHYDROCHLORIDE 5 MG PO TABS: 5.0000 mg | ORAL_TABLET | Freq: Two times a day (BID) | ORAL | 5 refills | Status: DC | PRN

## 2021-06-04 MED ORDER — XHANCE 93 MCG/ACT NA EXHU: 1.0000 | INHALANT_SUSPENSION | Freq: Two times a day (BID) | NASAL | 5 refills | Status: DC

## 2021-06-04 NOTE — Progress Notes (Addendum)
NEW PATIENT  Date of Service/Encounter:  06/04/21  Consult requested by: Leone Haven, MD   Assessment:   Seasonal and perennial allergic rhinitis (grasses, ragweed, weeds, trees, indoor molds, outdoor molds, cat, and dog)  Chronic migraines  Plan/Recommendations:   1. Seasonal and perennial allergic rhinitis - with overlying migraines - Testing today showed: grasses, ragweed, weeds, trees, indoor molds, outdoor molds, cat, and dog - Copy of test results provided.  - Avoidance measures provided. - Stop taking: Nasonex - Continue with: alternating antihistamines like you are doing (some people need to do TWICE daily) - Start taking: Xhance (fluticasone) 1-2 sprays per nostril daily - You can use an extra dose of the antihistamine, if needed, for breakthrough symptoms.  - Consider nasal saline rinses 1-2 times daily to remove allergens from the nasal cavities as well as help with mucous clearance (this is especially helpful to do before the nasal sprays are given) - Consider allergy shots as a means of long-term control. - Allergy shots "re-train" and "reset" the immune system to ignore environmental allergens and decrease the resulting immune response to those allergens (sneezing, itchy watery eyes, runny nose, nasal congestion, etc).    - Allergy shots improve symptoms in 75-85% of patients.  - We can discuss more at the next appointment if the medications are not working for you.  2. Return in about 6 weeks (around 07/16/2021).    This note in its entirety was forwarded to the Provider who requested this consultation.  Subjective:   Sarah Moran is a 40 y.o. female presenting today for evaluation of  Chief Complaint  Patient presents with   Allergy Testing    Wants to see what she is allergic to.    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 pylori infection 06/19/2018   Dyspepsia    Muscle ache 04/23/2018   Paresthesia 04/23/2018   Anxiety and depression 04/23/2018   Tension headache 04/23/2018   Hyperlipidemia 05/02/2017   Family history of breast cancer 12/15/2016   Annual physical exam 04/01/2016   History of thyroid cancer 06/27/2014   Hypothyroidism 06/27/2014   Migraine 09/23/2013    History obtained from: chart review and patient.  Sarah Moran was referred by Leone Haven, MD.     Zaide is a 39 y.o. female presenting for an evaluation of environmental allergies .  Allergic Rhinitis Symptom History: She has migraines forever. They keep trying to find some triggers. She has had allergy testing in [place]. She had a lot of vertigo in June and July. Symptoms last for 3-5 minutes. She does have some sinus pain and pressure with these. She was just out and about. She does have sneezing and rhinorrhea. She has had itchy eyes since being  off of her allergy medications. She has headaches as well.  She was never on shots. She has tried Flonase and azelastine. She has not done well with those. She has been on Nasonex in the past as well. This did help somewhat and she has bene on cetirizine. She was on loratadine for a period of time which stopped working. She was on Xyzal and it works during certain seasons. She has maybe tried  Human resources officer and it did not seem to work. For her, the sinus issues and headaches have been the worse.    Food Allergy Symptom History: She has migraines from raw onions. She has backed off of dairy. She does not do milk. She does almond milk. She has avoided dairy for the most part for 3 years. She has almond milk yogurt. When she breaks down and does ice cream, she suffers from it.   GERD Symptom History: She has dietary changes to avoid triggering her reflux. She has been on reflux and this resulted in terrible migraines.   She has been on a number of migraine medications including several antidepressants. She is now on Imitrex as needed. Some gave her worsening headaches. She was going to someone in [place]. The last time she went was over two months ago.   She has an 42yo and a 42yo. She does volunteer work. She moved here in 2000. She never had allergies like this until she came down here.  Otherwise, there is no history of other atopic diseases, including drug allergies, stinging insect allergies, eczema, urticaria, or contact dermatitis. There is no significant infectious history. Vaccinations are up to date.    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 pylori infection 06/19/2018   Dyspepsia    Muscle ache 04/23/2018    Paresthesia 04/23/2018   Anxiety and depression 04/23/2018   Tension headache 04/23/2018   Hyperlipidemia 05/02/2017   Family history of breast cancer 12/15/2016   Annual physical exam 04/01/2016   History of thyroid cancer 06/27/2014   Hypothyroidism 06/27/2014   Migraine 09/23/2013    Medication List:  Allergies as of 06/04/2021       Reactions   Darvon [propoxyphene] Other (See Comments)   Hallucinations   Onion Other (See Comments)   Migraines   Percocet [oxycodone-acetaminophen] Hives   Vicodin [hydrocodone-acetaminophen] Hives        Medication List        Accurate as of June 04, 2021 12:53 PM. If you have any questions, ask your nurse or doctor.          STOP taking these medications    azelastine 0.1 % nasal spray Commonly known as: ASTELIN Stopped by: Valentina Shaggy, MD   montelukast 10 MG tablet Commonly known as: SINGULAIR Stopped by: Valentina Shaggy, MD   Multi-Vitamin  tablet Stopped by: Valentina Shaggy, MD       TAKE these medications    Cod Liver Oil Caps Take 1 capsule by mouth at bedtime.   diclofenac Sodium 1 % Gel Commonly known as: VOLTAREN Apply 2 g topically 4 (four) times daily.   levocetirizine 5 MG tablet Commonly known as: XYZAL Take 1 tablet (5 mg total) by mouth 2 (two) times daily as needed for allergies (Break through symptoms). Started by: Valentina Shaggy, MD   Linzess 290 MCG Caps capsule Generic drug: linaclotide TAKE 1 CAPSULE (290 MCG TOTAL) BY MOUTH 2 (TWO) TIMES DAILY BEFORE A MEAL   SUMAtriptan 50 MG tablet Commonly known as: IMITREX TAKE ONE TABLET BY MOUTH AT ONSET OF MIGRAINE - MAY REPEAT DOSE IN 2 HOURS IF HEADACHE PERSISTS   SUMAtriptan 100 MG tablet Commonly known as: IMITREX TAKE HALF A TAB (50 MG) TO ONE TAB (100 MG) AT HEADACHE ONSET. REPEAT ONCE IN 2 HOURS IF NEEDED. TAKE NO MORE THAN 2 TABS IN A 24 HOURS PERIOD.   Synthroid 88 MCG tablet Generic drug:  levothyroxine TAKE 1 TABLET BY MOUTH DAILY BEFORE BREAKFAST.   Vitamin D3 50 MCG (2000 UT) capsule Take 2,000 Units by mouth daily.   Xhance 93 MCG/ACT Exhu Generic drug: Fluticasone Propionate Place 1 puff into the nose 2 (two) times daily. Started by: Valentina Shaggy, MD        Birth History: non-contributory  Developmental History: non-contributory  Past Surgical History: Past Surgical History:  Procedure Laterality Date   ANAL RECTAL MANOMETRY N/A 10/12/2018   Procedure: ANO RECTAL MANOMETRY;  Surgeon: Mauri Pole, MD;  Location: [facility];  Service: Endoscopy;  Laterality: N/A;   ANTERIOR AND POSTERIOR REPAIR WITH SACROSPINOUS FIXATION N/A 08/02/2018   Procedure: ANTERIOR AND POSTERIOR REPAIR;  Surgeon: Homero Fellers, MD;  Location: [facility];  Service: Gynecology;  Laterality: N/A;   BACK SURGERY  1997   scoliosis   CHOLECYSTECTOMY N/A 09/21/2018   Procedure: LAPAROSCOPIC CHOLECYSTECTOMY;  Surgeon: Fredirick Maudlin, MD;  Location: [facility];  Service: General;  Laterality: N/A;   COLONOSCOPY     ESOPHAGOGASTRODUODENOSCOPY (EGD) WITH PROPOFOL N/A 06/12/2018   Procedure: ESOPHAGOGASTRODUODENOSCOPY (EGD) WITH PROPOFOL;  Surgeon: Lin Landsman, MD;  Location: [facility];  Service: Gastroenterology;  Laterality: N/A;   HERNIA REPAIR  1990   bilateral inguinal   PUBOVAGINAL SLING N/A 08/02/2018   Procedure: PUBO-VAGINAL SLING- TVT;  Surgeon: Homero Fellers, MD;  Location: [facility];  Service: Gynecology;  Laterality: N/A;   THYROIDECTOMY  2007   thyroid cancer   TOTAL ABDOMINAL HYSTERECTOMY  2008   dymenorrhea     Family History: Family History  Problem Relation Age of Onset   Hyperlipidemia Father    Hypertension Father    Clotting disorder Sister    Hepatitis B Brother    Breast cancer Paternal Grandmother 86       with mets   Cervical cancer Mother        dx. in her early 40s   Breast cancer Paternal Aunt 34   Throat cancer  Paternal Aunt 24   Breast cancer Paternal Aunt 63   Breast cancer Paternal Aunt        72   Skin cancer Paternal Aunt    Cancer Cousin        unknown type dx. in her late 20s/early 73s   Skin cancer Cousin        13 cancerous moles removed  in her 71s     Social History: [place] at home with her family in a double wide.  It is 42 years old.  There is hardwood throughout.  They have electric heating and central cooling.  There is a multi pew in the home.  There are no dust mite covers on the bedding.  There is no tobacco exposure.  She currently works as a Agricultural engineer.  She does a lot of volunteer work as well with the [employer].  There is no tobacco exposure.  Review of Systems  Constitutional: Negative.  Negative for chills, fever, malaise/fatigue and weight loss.  HENT: Negative.  Negative for congestion, ear discharge and ear pain.   Eyes:  Negative for pain, discharge and redness.  Respiratory:  Negative for cough, sputum production, shortness of breath and wheezing.   Cardiovascular: Negative.  Negative for chest pain and palpitations.  Gastrointestinal:  Negative for abdominal pain, constipation, diarrhea, heartburn, nausea and vomiting.  Skin: Negative.  Negative for itching and rash.  Neurological:  Negative for dizziness and headaches.  Endo/Heme/Allergies:  Negative for environmental allergies. Does not bruise/bleed easily.      Objective:   Blood pressure 108/70, pulse 71, temperature 98 F (36.7 C), temperature source Temporal, resp. rate 16, height 5' (1.524 m), weight 141 lb (64 kg), SpO2 100 %. Body mass index is 27.54 kg/m.   Physical Exam:   Physical Exam Vitals reviewed.  Constitutional:      Appearance: She is well-developed.  HENT:     Head: Normocephalic and atraumatic.     Right Ear: Tympanic membrane, ear canal and external ear normal.     Left Ear: Tympanic membrane, ear canal and external ear normal.     Nose: Mucosal edema and  rhinorrhea present. No nasal deformity or septal deviation.     Right Turbinates: Enlarged, swollen and pale.     Left Turbinates: Enlarged, swollen and pale.     Right Sinus: No maxillary sinus tenderness or frontal sinus tenderness.     Left Sinus: No maxillary sinus tenderness or frontal sinus tenderness.     Comments: Sinus polypoid degeneration.    Mouth/Throat:     Mouth: Mucous membranes are not pale and not dry.     Pharynx: Uvula midline.  Eyes:     General: Lids are normal. No allergic shiner.       Right eye: No discharge.        Left eye: No discharge.     Conjunctiva/sclera: Conjunctivae normal.     Right eye: Right conjunctiva is not injected. No chemosis.    Left eye: Left conjunctiva is not injected. No chemosis.    Pupils: Pupils are equal, round, and reactive to light.  Cardiovascular:     Rate and Rhythm: Normal rate and regular rhythm.     Heart sounds: Normal heart sounds.  Pulmonary:     Effort: Pulmonary effort is normal. No tachypnea, accessory muscle usage or respiratory distress.     Breath sounds: Normal breath sounds. No wheezing, rhonchi or rales.  Chest:     Chest wall: No tenderness.  Lymphadenopathy:     Cervical: No cervical adenopathy.  Skin:    Coloration: Skin is not pale.     Findings: No abrasion, erythema, petechiae or rash. Rash is not papular, urticarial or vesicular.  Neurological:     Mental Status: She is alert.  Psychiatric:        Behavior: Behavior is cooperative.  Diagnostic studies:   Allergy Studies:     Airborne Adult Perc - 06/04/21 0848     Time Antigen Placed 0848    Allergen Manufacturer Lavella Hammock    Location Back    Number of Test 59    1. Control-Buffer 50% Glycerol Negative    2. Control-Histamine 1 mg/ml 2+    3. Albumin saline Negative    4. [place] Negative    5. [place] Negative    6. Johnson Negative    7. Cana Blue Negative    8. Meadow Fescue Negative    9. Perennial Rye Negative    10. Sweet  Vernal Negative    11. Timothy Negative    12. Cocklebur Negative    13. Burweed Marshelder Negative    14. Ragweed, short Negative    15. Ragweed, Giant Negative    16. Plantain,  English Negative    17. Lamb's Quarters Negative    18. Sheep Sorrell Negative    19. Rough Pigweed Negative    20. Marsh Elder, Rough Negative    21. Mugwort, Common Negative    22. Ash mix Negative    23. Birch mix Negative    24. Beech American Negative    25. Box, Elder Negative    26. Cedar, red Negative    27. Cottonwood, [place] Negative    28. Elm mix Negative    29. Hickory Negative    30. Maple mix Negative    31. Oak, [place] mix Negative    32. Pecan Pollen Negative    33. Pine mix Negative    34. Sycamore Eastern Negative    35. [place], Black Pollen Negative    36. Alternaria alternata Negative    37. Cladosporium Herbarum Negative    38. Aspergillus mix Negative    39. Penicillium mix Negative    40. Bipolaris sorokiniana (Helminthosporium) Negative    41. Drechslera spicifera (Curvularia) Negative    42. Mucor plumbeus Negative    43. Fusarium moniliforme Negative    44. Aureobasidium pullulans (pullulara) Negative    45. Rhizopus oryzae Negative    46. Botrytis cinera Negative    47. Epicoccum nigrum Negative    48. Phoma betae Negative    49. Candida Albicans Negative    50. Trichophyton mentagrophytes Negative    51. Mite, D Farinae  5,000 AU/ml Negative    52. Mite, D Pteronyssinus  5,000 AU/ml Negative    53. Cat Hair 10,000 BAU/ml Negative    54.  Dog Epithelia Negative    55. Mixed Feathers Negative    56. Horse Epithelia Negative    57. Cockroach, German Negative    58. Mouse Negative    59. Tobacco Leaf Negative             Intradermal - 06/04/21 0945     Time Antigen Placed 0945    Allergen Manufacturer Lavella Hammock    Location Arm    Number of Test 15    Control Negative    [place] 2+    Johnson 2+    7 Grass 2+    Ragweed mix 2+    Weed mix 1+    Tree  mix 3+    Mold 1 2+    Mold 2 Negative    Mold 3 1+    Mold 4 4+    Cat 2+    Dog 2+    Cockroach Negative    Mite mix Negative  Allergy testing results were read and interpreted by myself, documented by clinical staff.         Salvatore Marvel, MD Allergy and [place] [facility] [place]

## 2021-06-04 NOTE — Patient Instructions (Addendum)
1. Seasonal and perennial allergic rhinitis - with overlying migraines - Testing today showed: grasses, ragweed, weeds, trees, indoor molds, outdoor molds, cat, and dog - Copy of test results provided.  - Avoidance measures provided. - Stop taking: Nasonex - Continue with: alternating antihistamines like you are doing (some people need to do TWICE daily) - Start taking: Xhance (fluticasone) 1-2 sprays per nostril daily - You can use an extra dose of the antihistamine, if needed, for breakthrough symptoms.  - Consider nasal saline rinses 1-2 times daily to remove allergens from the nasal cavities as well as help with mucous clearance (this is especially helpful to do before the nasal sprays are given) - Consider allergy shots as a means of long-term control. - Allergy shots "re-train" and "reset" the immune system to ignore environmental allergens and decrease the resulting immune response to those allergens (sneezing, itchy watery eyes, runny nose, nasal congestion, etc).    - Allergy shots improve symptoms in 75-85% of patients.  - We can discuss more at the next appointment if the medications are not working for you.  2. Return in about 6 weeks (around 07/16/2021).    Please inform us of any Emergency Department visits, hospitalizations, or changes in symptoms. Call us before going to the ED for breathing or allergy symptoms since we might be able to fit you in for a sick visit. Feel free to contact us anytime with any questions, problems, or concerns.  It was a pleasure to meet you today!  Websites that have reliable patient information: 1. American Academy of Asthma, Allergy, and Immunology: www.aaaai.org 2. Food Allergy Research and Education (FARE): foodallergy.org 3. Mothers of Asthmatics: http://www.asthmacommunitynetwork.org 4. American College of Allergy, Asthma, and Immunology: www.acaai.org   COVID-19 Vaccine Information can be found at:  [email or web address] For questions related to vaccine distribution or appointments, please email [email or web address] or call [phone].   We realize that you might be concerned about having an allergic reaction to the COVID19 vaccines. To help with that concern, WE ARE OFFERING THE COVID19 VACCINES IN OUR OFFICE! Ask the front desk for dates!     "Like" [place] on Facebook and Instagram for our latest updates!      A healthy democracy works best when [employer] participate! Make sure you are registered to vote! If you have moved or changed any of your contact information, you will need to get this updated before voting!  In some cases, you MAY be able to register to vote online: [email or web address]       Number of Test 15   Control Negative   [place] 2+   [place] 2+   7 Grass 2+   Ragweed mix 2+   Weed mix 1+   Tree mix 3+   Mold 1 2+   Mold 2 Negative   Mold 3 1+   Mold 4 4+   Cat 2+   Dog 2+   Cockroach Negative   Mite mix Negative     Reducing Pollen Exposure  The American Academy of Allergy, Asthma and Immunology suggests the following steps to reduce your exposure to pollen during allergy seasons.    Do not hang sheets or clothing out to dry; pollen may collect on these items. Do not mow lawns or spend time around freshly cut grass; mowing stirs up pollen. Keep windows closed at night.  Keep car windows closed while driving. Minimize morning activities outdoors, a time when pollen counts are usually at their highest. Stay indoors as much as  possible when pollen counts or humidity is high and on windy days when pollen tends to remain in the air longer. Use air conditioning when possible.  Many air conditioners have filters that trap the pollen spores. Use a HEPA room air filter to remove pollen form the indoor air you breathe.  Control of Mold Allergen   Mold and fungi can  grow on a variety of surfaces provided certain temperature and moisture conditions exist.  Outdoor molds grow on plants, decaying vegetation and soil.  The major outdoor mold, Alternaria and Cladosporium, are found in very high numbers during hot and dry conditions.  Generally, a late Summer - Fall peak is seen for common outdoor fungal spores.  Rain will temporarily lower outdoor mold spore count, but counts rise rapidly when the rainy period ends.  The most important indoor molds are Aspergillus and Penicillium.  Dark, humid and poorly ventilated basements are ideal sites for mold growth.  The next most common sites of mold growth are the bathroom and the kitchen.  Outdoor (Seasonal) Mold Control  Positive outdoor molds via skin testing: Alternaria, Cladosporium, Bipolaris (Helminthsporium), Drechslera (Curvalaria), and Mucor  Use air conditioning and keep windows closed Avoid exposure to decaying vegetation. Avoid leaf raking. Avoid grain handling. Consider wearing a face mask if working in moldy areas.    Indoor (Perennial) Mold Control   Positive indoor molds via skin testing: Aspergillus, Penicillium, Fusarium, Aureobasidium ([place]ulara), and Rhizopus  Maintain humidity below 50%. Clean washable surfaces with 5% bleach solution. Remove sources e.g. contaminated carpets.    Control of Dog or Cat Allergen  Avoidance is the best way to manage a dog or cat allergy. If you have a dog or cat and are allergic to dog or cats, consider removing the dog or cat from the home. If you have a dog or cat but don't want to find it a new home, or if your family wants a pet even though someone in the household is allergic, here are some strategies that may help keep symptoms at bay:  Keep the pet out of your bedroom and restrict it to only a few rooms. Be advised that keeping the dog or cat in only one room will not limit the allergens to that room. Don't pet, hug or kiss the dog or cat; if you do,  wash your hands with soap and water. High-efficiency particulate air (HEPA) cleaners run continuously in a bedroom or living room can reduce allergen levels over time. Regular use of a high-efficiency vacuum cleaner or a central vacuum can reduce allergen levels. Giving your dog or cat a bath at least once a week can reduce airborne allergen.  Allergy Shots   Allergies are the result of a chain reaction that starts in the immune system. Your immune system controls how your body defends itself. For instance, if you have an allergy to pollen, your immune system identifies pollen as an invader or allergen. Your immune system overreacts by producing antibodies called Immunoglobulin E (IgE). These antibodies travel to cells that release chemicals, causing an allergic reaction.  The concept behind allergy immunotherapy, whether it is received in the form of shots or tablets, is that the immune system can be desensitized to specific allergens that trigger allergy symptoms. Although it requires time and patience, the payback can be long-term relief.  How Do Allergy Shots Work?  Allergy shots work much like a vaccine. Your body responds to injected amounts of a particular allergen given in increasing doses,  eventually developing a resistance and tolerance to it. Allergy shots can lead to decreased, minimal or no allergy symptoms.  There generally are two phases: build-up and maintenance. Build-up often ranges from three to six months and involves receiving injections with increasing amounts of the allergens. The shots are typically given once or twice a week, though more rapid build-up schedules are sometimes used.  The maintenance phase begins when the most effective dose is reached. This dose is different for each person, depending on how allergic you are and your response to the build-up injections. Once the maintenance dose is reached, there are longer periods between injections, typically two to four  weeks.  Occasionally doctors give cortisone-type shots that can temporarily reduce allergy symptoms. These types of shots are different and should not be confused with allergy immunotherapy shots.  Who Can Be Treated with Allergy Shots?  Allergy shots may be a good treatment approach for people with allergic rhinitis (hay fever), allergic asthma, conjunctivitis (eye allergy) or stinging insect allergy.   Before deciding to begin allergy shots, you should consider:   The length of allergy season and the severity of your symptoms  Whether medications and/or changes to your environment can control your symptoms  Your desire to avoid long-term medication use  Time: allergy immunotherapy requires a major time commitment  Cost: may vary depending on your insurance coverage  Allergy shots for children age 78 and older are effective and often well tolerated. They might prevent the onset of new allergen sensitivities or the progression to asthma.  Allergy shots are not started on patients who are pregnant but can be continued on patients who become pregnant while receiving them. In some patients with other medical conditions or who take certain common medications, allergy shots may be of risk. It is important to mention other medications you talk to your allergist.   When Will I Feel Better?  Some may experience decreased allergy symptoms during the build-up phase. For others, it may take as long as 12 months on the maintenance dose. If there is no improvement after a year of maintenance, your allergist will discuss other treatment options with you.  If you aren't responding to allergy shots, it may be because there is not enough dose of the allergen in your vaccine or there are missing allergens that were not identified during your allergy testing. Other reasons could be that there are high levels of the allergen in your environment or major exposure to non-allergic triggers like tobacco  smoke.  What Is the Length of Treatment?  Once the maintenance dose is reached, allergy shots are generally continued for three to five years. The decision to stop should be discussed with your allergist at that time. Some people may experience a permanent reduction of allergy symptoms. Others may relapse and a longer course of allergy shots can be considered.  What Are the Possible Reactions?  The two types of adverse reactions that can occur with allergy shots are local and systemic. Common local reactions include very mild redness and swelling at the injection site, which can happen immediately or several hours after. A systemic reaction, which is less common, affects the entire body or a particular body system. They are usually mild and typically respond quickly to medications. Signs include increased allergy symptoms such as sneezing, a stuffy nose or hives.  Rarely, a serious systemic reaction called anaphylaxis can develop. Symptoms include swelling in the throat, wheezing, a feeling of tightness in the chest, nausea or  dizziness. Most serious systemic reactions develop within 30 minutes of allergy shots. This is why it is strongly recommended you wait in your doctor's office for 30 minutes after your injections. Your allergist is trained to watch for reactions, and his or her staff is trained and equipped with the proper medications to identify and treat them.  Who Should Administer Allergy Shots?  The preferred location for receiving shots is your prescribing allergist's office. Injections can sometimes be given at another facility where the physician and staff are trained to recognize and treat reactions, and have received instructions by your prescribing allergist.

## 2021-06-07 DIAGNOSIS — Z85850 Personal history of malignant neoplasm of thyroid: Secondary | ICD-10-CM | POA: Diagnosis not present

## 2021-06-07 DIAGNOSIS — E890 Postprocedural hypothyroidism: Secondary | ICD-10-CM | POA: Diagnosis not present

## 2021-06-16 ENCOUNTER — Telehealth: Payer: Self-pay

## 2021-06-16 ENCOUNTER — Encounter: Payer: Self-pay | Admitting: General Surgery

## 2021-06-16 ENCOUNTER — Other Ambulatory Visit: Payer: Self-pay | Admitting: Endocrinology

## 2021-06-17 NOTE — Telephone Encounter (Signed)
error 

## 2021-06-24 ENCOUNTER — Ambulatory Visit: Payer: BC Managed Care – PPO | Admitting: Allergy & Immunology

## 2021-07-16 ENCOUNTER — Ambulatory Visit: Payer: BC Managed Care – PPO | Admitting: Allergy & Immunology

## 2021-07-16 ENCOUNTER — Other Ambulatory Visit: Payer: Self-pay

## 2021-07-16 VITALS — BP 118/70 | HR 74 | Temp 97.2°F | Resp 16 | Ht 60.5 in | Wt 144.6 lb

## 2021-07-16 DIAGNOSIS — J331 Polypoid sinus degeneration: Secondary | ICD-10-CM | POA: Diagnosis not present

## 2021-07-16 DIAGNOSIS — G43809 Other migraine, not intractable, without status migrainosus: Secondary | ICD-10-CM

## 2021-07-16 DIAGNOSIS — R42 Dizziness and giddiness: Secondary | ICD-10-CM | POA: Diagnosis not present

## 2021-07-16 DIAGNOSIS — J3089 Other allergic rhinitis: Secondary | ICD-10-CM | POA: Diagnosis not present

## 2021-07-16 DIAGNOSIS — J302 Other seasonal allergic rhinitis: Secondary | ICD-10-CM

## 2021-07-16 MED ORDER — LEVOCETIRIZINE DIHYDROCHLORIDE 5 MG PO TABS: 5.0000 mg | ORAL_TABLET | Freq: Two times a day (BID) | ORAL | 5 refills | Status: DC | PRN

## 2021-07-16 NOTE — Progress Notes (Signed)
FOLLOW UP  Date of Service/Encounter:  07/16/21   Assessment:   Seasonal and perennial allergic rhinitis (grasses, ragweed, weeds, trees, indoor molds, outdoor molds, cat, and dog)   Chronic migraines  Plan/Recommendations:   1. Seasonal and perennial allergic rhinitis - with overlying migraines (grasses, ragweed, weeds, trees, indoor molds, outdoor molds, cat, and dog) - Try using Afrin twice daily (max 3 days) BEFORE using the Xhance to help the [place] get farther back in there.  - After three days, your nose can become dependent on this.  - Continue with: alternating antihistamines (up to two at a time), change every couple of months - Continue taking: Xhance (fluticasone) 1-2 sprays per nostril daily - You can use an extra dose of the antihistamine, if needed, for breakthrough symptoms.  - Consider nasal saline rinses 1-2 times daily to remove allergens from the nasal cavities as well as help with mucous clearance (this is especially helpful to do before the nasal sprays are given) - Consider allergy shots as a means of long-term control. - Allergy shots "re-train" and "reset" the immune system to ignore environmental allergens and decrease the resulting immune response to those allergens (sneezing, itchy watery eyes, runny nose, nasal congestion, etc).    - Allergy shots improve symptoms in 75-85% of patients.   2. Vertigo - We are going to refer you to Dr. Benjamine Mola for an evaluation.  - He is in [place].  3. Return in about 6 months (around 01/14/2022).   Subjective:   Sarah Moran is a 40 y.o. female presenting today for follow up of  Chief Complaint  Patient presents with   Follow-up    Patient in for follow up after starting xhance and she thinks it worked well but she has since started to have nose bleeds, dry eyes and sinus pressure.     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 pylori infection 06/19/2018   Dyspepsia    Muscle ache 04/23/2018   Paresthesia 04/23/2018   Anxiety and depression 04/23/2018   Tension headache 04/23/2018   Hyperlipidemia 05/02/2017   Family history of breast cancer 12/15/2016   Annual physical exam 04/01/2016   History of thyroid cancer 06/27/2014   Hypothyroidism 06/27/2014   Migraine 09/23/2013    History obtained from: chart review and patient.  Sarah Moran is a 40 y.o. female presenting for a follow up visit. She had testing that was positive to grasses, ragweed, weeds, trees, indoor and outdoor molds, cat, and dog. We stopped her Nasonex and continued with alternating antihistamines. We started her on Xhance.   Since the last visit, she has mostly done well. She did feel good after the first few weeks after I saw her. She did have some more ice cream than usual  and this always tends to make her mucous worse. She has had sinus pressure for over two weeks. I did discharge her with prednisone at the last visit.   She remains on the [place]. She feels that it runs down her throat now. She felt that she was doing it really well at first and it seemed to go farther into her sinuses. She does report some burning with her nose at the application of the [place].   She is using her antihistamines two at night. She does report  some vertigo that has been getting worse. She was seeing a neurologist at some point. She woke up in the middle of the night with nausea and dizziness.  Otherwise, there have been no changes to her past medical history, surgical history, family history, or social history.    Review of Systems  Constitutional: Negative.  Negative for fever, malaise/fatigue and weight loss.  HENT: Negative.  Negative for congestion, ear discharge and ear pain.   Eyes:  Negative for pain, discharge and redness.  Respiratory:  Negative for cough, sputum production, shortness of breath and wheezing.   Cardiovascular: Negative.  Negative for chest pain and palpitations.  Gastrointestinal:  Negative for abdominal pain, heartburn, nausea and vomiting.  Skin: Negative.  Negative for itching and rash.  Neurological:  Positive for dizziness. Negative for headaches.  Endo/Heme/Allergies:  Positive for environmental allergies. Does not bruise/bleed easily.      Objective:   Blood pressure 118/70, pulse 74, temperature (!) 97.2 F (36.2 C), temperature source Temporal, resp. rate 16, height 5' 0.5" (1.537 m), weight 144 lb 9.6 oz (65.6 kg), SpO2 100 %. Body mass index is 27.78 kg/m.   Physical Exam:  Physical Exam Vitals reviewed.  Constitutional:      Appearance: She is well-developed.     Comments: Pleasant.  HENT:     Head: Normocephalic and atraumatic.     Right Ear: Tympanic membrane, ear canal and external ear normal.     Left Ear: Tympanic membrane, ear canal and external ear normal.     Nose: No nasal deformity, septal deviation, mucosal edema or rhinorrhea.     Right Turbinates: Enlarged, swollen and pale.     Left Turbinates: Enlarged, swollen and pale.     Right Sinus: No maxillary sinus tenderness or frontal sinus tenderness.     Left Sinus: No maxillary sinus tenderness or frontal sinus tenderness.     Mouth/Throat:     Mouth: Mucous membranes are not pale and not dry.     Pharynx: Uvula  midline.  Eyes:     General: Lids are normal. No allergic shiner.       Right eye: No discharge.        Left eye: No discharge.     Conjunctiva/sclera: Conjunctivae normal.     Right eye: Right conjunctiva is not injected. No chemosis.    Left eye: Left conjunctiva is not injected. No chemosis.    Pupils: Pupils are equal, round, and reactive to light.  Cardiovascular:     Rate and Rhythm: Normal rate and regular rhythm.     Heart sounds: Normal heart sounds.  Pulmonary:     Effort: Pulmonary effort is normal. No tachypnea, accessory muscle usage or respiratory distress.     Breath sounds: Normal breath sounds. No wheezing, rhonchi or rales.     Comments: Moving air well in all lung fields.  No increased work of breathing. Chest:     Chest wall: No  tenderness.  Lymphadenopathy:     Cervical: No cervical adenopathy.  Skin:    General: Skin is warm.     Capillary Refill: Capillary refill takes less than 2 seconds.     Coloration: Skin is not pale.     Findings: No abrasion, erythema, petechiae or rash. Rash is not papular, urticarial or vesicular.     Comments: No eczematous or urticarial lesions noted.  Neurological:     Mental Status: She is alert.  Psychiatric:        Behavior: Behavior is cooperative.     Diagnostic studies: none    Salvatore Marvel, MD  [facility]

## 2021-07-16 NOTE — Patient Instructions (Addendum)
1. Seasonal and perennial allergic rhinitis - with overlying migraines (grasses, ragweed, weeds, trees, indoor molds, outdoor molds, cat, and dog) - Try using Afrin twice daily (max 3 days) BEFORE using the Xhance to help the [place] get farther back in there.  - After three days, your nose can become dependent on this.  - Continue with: alternating antihistamines (up to two at a time), change every couple of months - Continue taking: Xhance (fluticasone) 1-2 sprays per nostril daily - You can use an extra dose of the antihistamine, if needed, for breakthrough symptoms.  - Consider nasal saline rinses 1-2 times daily to remove allergens from the nasal cavities as well as help with mucous clearance (this is especially helpful to do before the nasal sprays are given) - Consider allergy shots as a means of long-term control. - Allergy shots "re-train" and "reset" the immune system to ignore environmental allergens and decrease the resulting immune response to those allergens (sneezing, itchy watery eyes, runny nose, nasal congestion, etc).    - Allergy shots improve symptoms in 75-85% of patients.   2. Vertigo - We are going to refer you to Dr. Benjamine Mola for an evaluation.  - He is in [place].  3. Return in about 6 months (around 01/14/2022).    Please inform us of any Emergency Department visits, hospitalizations, or changes in symptoms. Call us before going to the ED for breathing or allergy symptoms since we might be able to fit you in for a sick visit. Feel free to contact us anytime with any questions, problems, or concerns.  It was a pleasure to see you again today!  Websites that have reliable patient information: 1. American Academy of Asthma, Allergy, and Immunology: www.aaaai.org 2. Food Allergy Research and Education (FARE): foodallergy.org 3. Mothers of Asthmatics: http://www.asthmacommunitynetwork.org 4. American College of Allergy, Asthma, and Immunology: www.acaai.org   COVID-19  Vaccine Information can be found at: [email or web address] For questions related to vaccine distribution or appointments, please email [email or web address] or call [phone].   We realize that you might be concerned about having an allergic reaction to the COVID19 vaccines. To help with that concern, WE ARE OFFERING THE COVID19 VACCINES IN OUR OFFICE! Ask the front desk for dates!     "Like" [place] on Facebook and Instagram for our latest updates!      A healthy democracy works best when [employer] participate! Make sure you are registered to vote! If you have moved or changed any of your contact information, you will need to get this updated before voting!  In some cases, you MAY be able to register to vote online: [email or web address]    EARLY VOTING HAS STARTED! If you still need to register to vote, you can do this and cast a ballot at any of the early voting locations!

## 2021-07-19 ENCOUNTER — Encounter: Payer: Self-pay | Admitting: Allergy & Immunology

## 2021-07-19 DIAGNOSIS — R42 Dizziness and giddiness: Secondary | ICD-10-CM | POA: Insufficient documentation

## 2021-07-27 ENCOUNTER — Encounter: Payer: Self-pay | Admitting: Family Medicine

## 2021-07-27 ENCOUNTER — Other Ambulatory Visit: Payer: Self-pay

## 2021-07-27 ENCOUNTER — Telehealth: Payer: Self-pay

## 2021-07-27 ENCOUNTER — Ambulatory Visit (INDEPENDENT_AMBULATORY_CARE_PROVIDER_SITE_OTHER): Payer: BC Managed Care – PPO | Admitting: Family Medicine

## 2021-07-27 VITALS — BP 90/60 | HR 83 | Temp 98.6°F | Ht 60.0 in | Wt 141.8 lb

## 2021-07-27 DIAGNOSIS — Z23 Encounter for immunization: Secondary | ICD-10-CM

## 2021-07-27 DIAGNOSIS — E785 Hyperlipidemia, unspecified: Secondary | ICD-10-CM

## 2021-07-27 DIAGNOSIS — E663 Overweight: Secondary | ICD-10-CM

## 2021-07-27 DIAGNOSIS — Z0000 Encounter for general adult medical examination without abnormal findings: Secondary | ICD-10-CM | POA: Diagnosis not present

## 2021-07-27 DIAGNOSIS — Z1231 Encounter for screening mammogram for malignant neoplasm of breast: Secondary | ICD-10-CM

## 2021-07-27 LAB — COMPREHENSIVE METABOLIC PANEL
ALT: 9 U/L (ref 0–35)
AST: 15 U/L (ref 0–37)
Albumin: 4.2 g/dL (ref 3.5–5.2)
Alkaline Phosphatase: 64 U/L (ref 39–117)
BUN: 8 mg/dL (ref 6–23)
CO2: 28 mEq/L (ref 19–32)
Calcium: 9.1 mg/dL (ref 8.4–10.5)
Chloride: 102 mEq/L (ref 96–112)
Creatinine, Ser: 0.63 mg/dL (ref 0.40–1.20)
GFR: 109.52 mL/min (ref >60.00)
Glucose, Bld: 90 mg/dL (ref 70–99)
Potassium: 4.1 mEq/L (ref 3.5–5.1)
Sodium: 137 mEq/L (ref 135–145)
Total Bilirubin: 0.6 mg/dL (ref 0.2–1.2)
Total Protein: 6.6 g/dL (ref 6.0–8.3)

## 2021-07-27 LAB — LIPID PANEL
Cholesterol: 196 mg/dL (ref 0–200)
HDL: 39 mg/dL — ABNORMAL LOW (ref >39.00)
LDL Cholesterol: 128 mg/dL — ABNORMAL HIGH (ref 0–99)
NonHDL: 156.96
Total CHOL/HDL Ratio: 5
Triglycerides: 145 mg/dL (ref 0.0–149.0)
VLDL: 29 mg/dL (ref 0.0–40.0)

## 2021-07-27 LAB — HEMOGLOBIN A1C: Hgb A1c MFr Bld: 5.9 % (ref 4.6–6.5)

## 2021-07-27 NOTE — Assessment & Plan Note (Addendum)
Physical exam completed.  I encouraged cutting down on sugar intake.  Discussed increasing her exercise.  She will continue to follow with GYN for her breast MRI given her strong family history.  Pelvic and breast exam deferred to GYN.  Mammogram ordered.  Patient knows to schedule this for January.  Flu vaccine will be given today.  She was encouraged to get the newest COVID booster.  Lab work as outlined.

## 2021-07-27 NOTE — Telephone Encounter (Signed)
-----   Message from Valentina Shaggy, MD sent at 07/19/2021  7:34 AM EDT ----- ENT referral placed.

## 2021-07-27 NOTE — Progress Notes (Signed)
Tommi Rumps, MD Phone: [phone]  Sarah Moran is a 40 y.o. female who presents today for CPE.  Diet: "not good", notes she craves sugar, not many fired foods, no soda or tea Exercise: walks 30 minutes daily, getting in to the gym some Pap smear: hysterectomy, follows with GYN Colonoscopy: not indicated Mammogram: UTD, high risk, mammograms and MRI through GYN Family history-  Colon cancer: no  Breast cancer: 3 paternal aunts and paternal grandmother  Ovarian cancer: no Menses: hysterectomy Vaccines-   Flu: due  Tetanus: UTD  COVID19: x3, due for bivalent booster HIV screening: UTD Hep C Screening: reports having this completed in the past Tobacco use: no Alcohol use: rare Illicit Drug use: no Dentist: yes Ophthalmology: yes Positives on review of systems are all chronic and stable.   Active Ambulatory Problems    Diagnosis Date Noted   Migraine 09/23/2013   History of thyroid cancer 06/27/2014   Hypothyroidism 06/27/2014   Routine general medical examination at a health care facility 04/01/2016   Family history of breast cancer 12/15/2016   Hyperlipidemia 05/02/2017   Muscle ache 04/23/2018   Paresthesia 04/23/2018   Anxiety and depression 04/23/2018   Tension headache 04/23/2018   Dyspepsia    H. pylori infection 06/19/2018   Prolapse of female pelvic organs 08/02/2018   Rectocele 08/02/2018   Breast pain, right 08/16/2018   Calculus of gallbladder without cholecystitis without obstruction    Constipation    Allergic rhinitis 05/06/2019   Arthralgia 09/30/2019   Family history of skin cancer    Family history of cervical cancer    Family history of throat cancer    ANA positive 11/06/2019   Left elbow pain 11/06/2019   Medication side effect 07/31/2020   Subcutaneous nodule 09/24/2020   Trapezius strain 11/06/2020   Sleeping difficulty 11/06/2020   Seasonal and perennial allergic rhinitis 06/04/2021   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    Vertigo 07/19/2021   Resolved Ambulatory Problems    Diagnosis Date Noted   Microscopic hematuria 10/24/2013   Sinusitis 11/13/2013   Urinary frequency 06/27/2014   Acute bronchitis 10/07/2014   Muscle strain of right upper back 12/10/2014   Sinus pressure 04/22/2015   Dysuria 07/22/2015   Past Medical History:  Diagnosis Date   Allergy    Anal fissure    Anxiety    Chronic pelvic pain in female    Frequent headaches    Gallstone    Gallstones    Genetic testing of female 01/2017   GERD (gastroesophageal reflux disease)    IBS (irritable bowel syndrome)    Increased risk of breast cancer 01/2017   Liver cyst 04/2018   Personal history of radiation therapy    Seizure ([place]) 1997   Thyroid cancer ([place]) 2007    Family History  Problem Relation Age of Onset   Hyperlipidemia Father    Hypertension Father    Clotting disorder Sister    Hepatitis B Brother    Breast cancer Paternal Grandmother 10       with mets   Cervical cancer Mother        dx. in her early 17s   Breast cancer Paternal Aunt 25   Throat cancer Paternal Aunt 22   Breast cancer Paternal Aunt 68   Breast cancer Paternal Aunt        79   Skin cancer Paternal Aunt    Cancer Cousin        unknown type dx. in her late  20s/early 30s   Skin cancer Cousin        13 cancerous moles removed in her 1s    Social History   Socioeconomic History   Marital status: Married    Spouse name: Audelia Acton   Number of children: 2   Years of education: 12   Highest education level: Not on file  Occupational History   Occupation: Stay at home mom  Tobacco Use   Smoking status: Never   Smokeless tobacco: Never  Vaping Use   Vaping Use: Never used  Substance and Sexual Activity   Alcohol use: Yes   Drug use: No   Sexual activity: Yes    Birth control/protection: Surgical    Comment: Hysterectomy  Other Topics Concern   Not on file  Social History Narrative   Breland grew up in [place]. She currently lives  in [place] with her husband, Audelia Acton, and their 2 sons ([place]  and [place] ). 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 Fredrick belongs to the [employer]. She enjoys outdoor activities on her spare time.   Social Determinants of Health   Financial Resource Strain: Not on file  Food Insecurity: Not on file  Transportation Needs: Not on file  Physical Activity: Not on file  Stress: Not on file  Social Connections: Not on file  Intimate Partner Violence: Not on file    ROS  General:  Negative for nexplained weight loss, fever Skin: Negative for new or changing mole, sore that won't heal HEENT: Negative for trouble hearing, trouble seeing, ringing in ears, mouth sores, hoarseness, change in voice, dysphagia. CV:  Negative for chest pain, dyspnea, edema, palpitations Resp: Negative for cough, dyspnea, hemoptysis GI: Positive for constipation, negative for nausea, vomiting, diarrhea, abdominal pain, melena, hematochezia. GU: Positive for breast cyst (chronic, stable, followed by GYN), negative for dysuria, incontinence, urinary hesitance, hematuria, vaginal or penile discharge, polyuria, sexual difficulty MSK: Positive for joint pain or swelling (chronic, stable), negative for muscle cramps or aches Neuro: Positive for headaches, vertigo (both are chronic and stable), negative for weakness, numbness, passing out/fainting Psych: Negative for depression, anxiety, memory problems  Objective  Physical Exam Vitals:   07/27/21 0835  BP: 90/60  Pulse: 83  Temp: 98.6 F (37 C)  SpO2: 99%    BP Readings from Last 3 Encounters:  07/27/21 90/60  07/16/21 118/70  06/04/21 108/70   Wt Readings from Last 3 Encounters:  07/27/21 141 lb 12.8 oz (64.3 kg)  07/16/21 144 lb 9.6 oz (65.6 kg)  06/04/21 141 lb (64 kg)    Physical Exam Constitutional:      General: She is not in acute distress.    Appearance: She is not diaphoretic.   HENT:     Head: Normocephalic and atraumatic.  Cardiovascular:     Rate and Rhythm: Normal rate and regular rhythm.     Heart sounds: Normal heart sounds.  Pulmonary:     Effort: Pulmonary effort is normal.     Breath sounds: Normal breath sounds.  Abdominal:     General: Bowel sounds are normal. There is no distension.     Palpations: Abdomen is soft.     Tenderness: There is no abdominal tenderness. There is no guarding or rebound.  Musculoskeletal:     Right lower leg: No edema.     Left lower leg: No edema.  Lymphadenopathy:     Cervical: No cervical adenopathy.  Skin:  General: Skin is warm and dry.  Neurological:     Mental Status: She is alert.  Psychiatric:        Mood and Affect: Mood normal.     Assessment/Plan:   Problem List Items Addressed This Visit     Hyperlipidemia   Relevant Orders   Comp Met (CMET)   Lipid panel   Routine general medical examination at a health care facility - Primary    Physical exam completed.  I encouraged cutting down on sugar intake.  Discussed increasing her exercise.  She will continue to follow with GYN for her breast MRI given her strong family history.  Pelvic and breast exam deferred to GYN.  Mammogram ordered.  Patient knows to schedule this for January.  Flu vaccine will be given today.  She was encouraged to get the newest COVID booster.  Lab work as outlined.      Other Visit Diagnoses     Overweight       Relevant Orders   HgB A1c   Encounter for screening mammogram for malignant neoplasm of breast       Relevant Orders   MM 3D SCREEN BREAST BILATERAL       Return in about 6 months (around 01/24/2022).  This visit occurred during the SARS-CoV-2 public health emergency.  Safety protocols were in place, including screening questions prior to the visit, additional usage of staff PPE, and extensive cleaning of exam room while observing appropriate contact time as indicated for disinfecting solutions.    Tommi Rumps, MD [place]

## 2021-07-27 NOTE — Patient Instructions (Signed)
Nice to see. Please try to cut down on your sugar intake. Please try to increase your exercise as we discussed. We will contact you with your lab results.

## 2021-08-02 DIAGNOSIS — Z20822 Contact with and (suspected) exposure to covid-19: Secondary | ICD-10-CM | POA: Diagnosis not present

## 2021-09-14 ENCOUNTER — Other Ambulatory Visit: Payer: Self-pay | Admitting: Endocrinology

## 2021-09-19 ENCOUNTER — Encounter: Payer: Self-pay | Admitting: Family Medicine

## 2021-11-10 DIAGNOSIS — H9313 Tinnitus, bilateral: Secondary | ICD-10-CM | POA: Diagnosis not present

## 2021-11-10 DIAGNOSIS — R42 Dizziness and giddiness: Secondary | ICD-10-CM | POA: Diagnosis not present

## 2021-11-16 ENCOUNTER — Ambulatory Visit
Admission: RE | Admit: 2021-11-16 | Discharge: 2021-11-16 | Disposition: A | Discharge status: Home / Self Care | Payer: BC Managed Care – PPO | Source: Ambulatory Visit | Attending: Family Medicine | Admitting: Family Medicine

## 2021-11-16 ENCOUNTER — Other Ambulatory Visit: Payer: Self-pay

## 2021-11-16 DIAGNOSIS — Z1231 Encounter for screening mammogram for malignant neoplasm of breast: Secondary | ICD-10-CM | POA: Insufficient documentation

## 2021-11-16 IMAGING — MG MM DIGITAL SCREENING BILAT W/ TOMO AND CAD
8 series · 8 of 24 positions shown · non-contrast
Comparison: Previous exam(s).

CLINICAL DATA: Screening.

EXAM:
DIGITAL SCREENING BILATERAL MAMMOGRAM WITH TOMOSYNTHESIS AND CAD
TECHNIQUE: Bilateral screening digital craniocaudal and mediolateral oblique
mammograms were obtained. Bilateral screening digital breast
tomosynthesis was performed. The images were evaluated with
computer-aided detection.

[L MLO synth-2D]
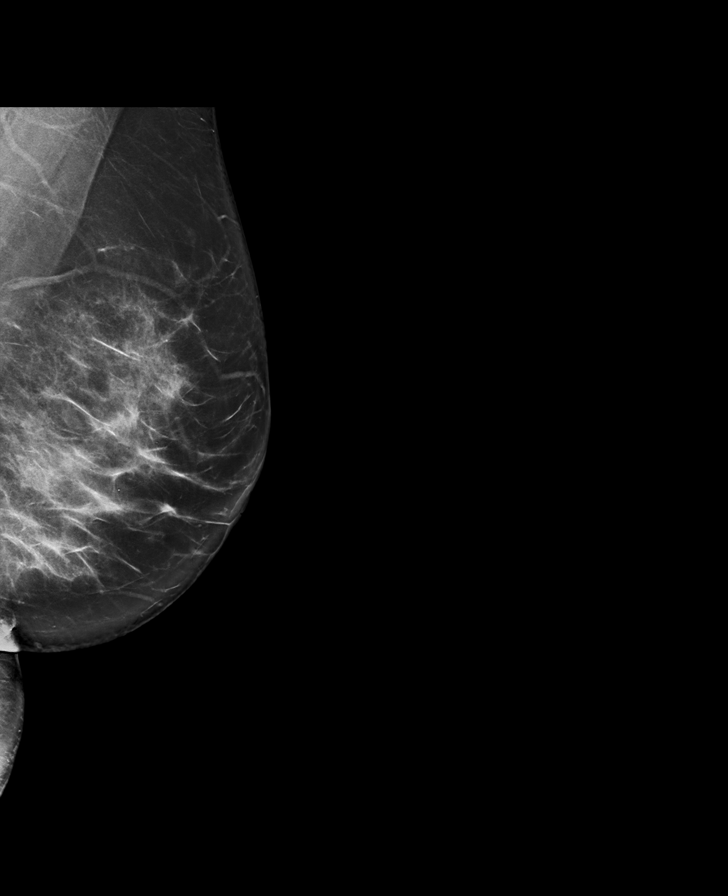

[L CC synth-2D]
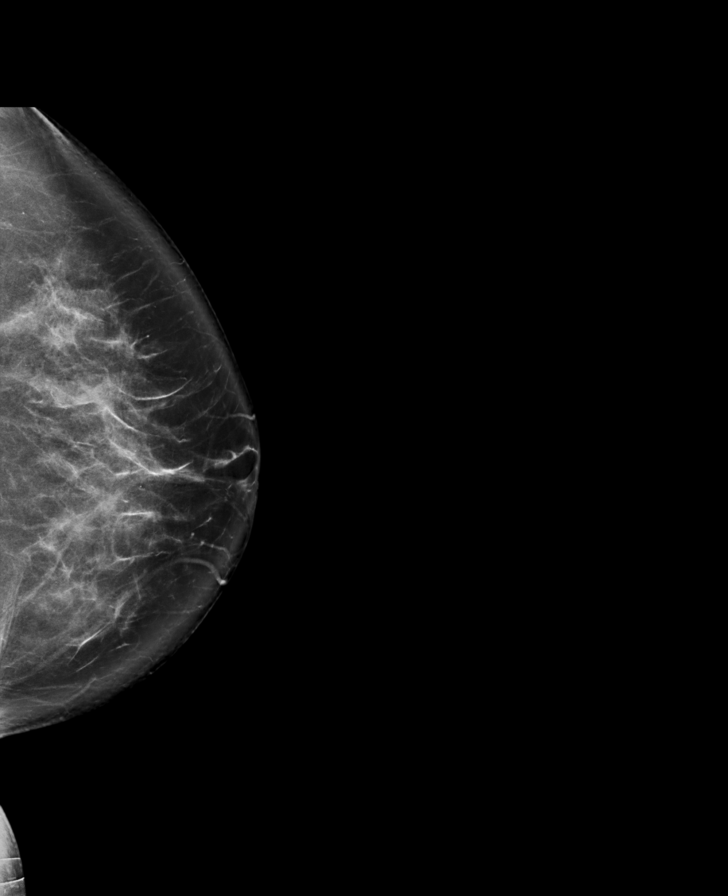

[R MLO synth-2D]
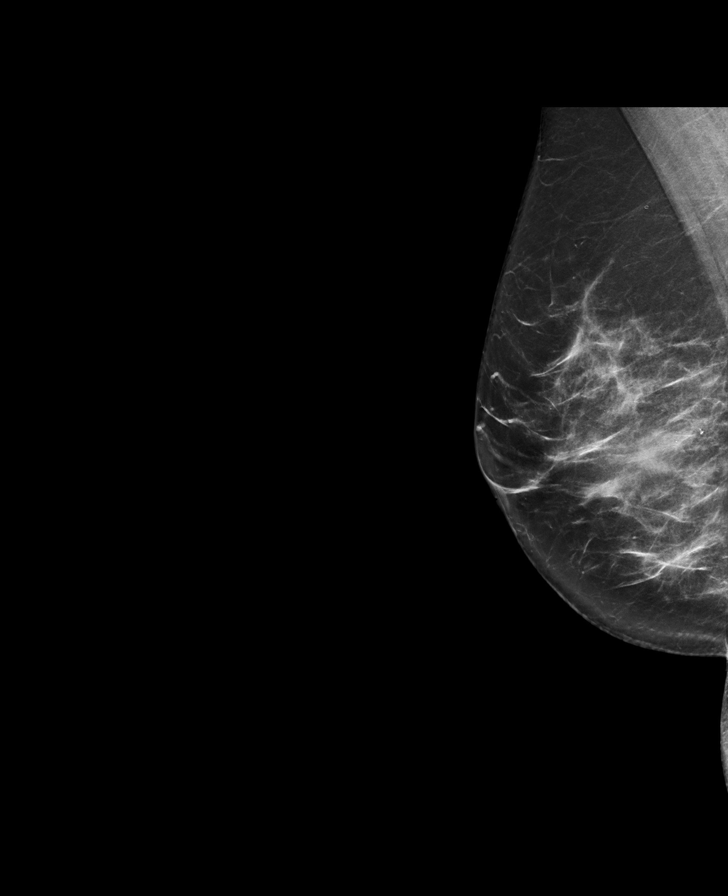

[R CC synth-2D]
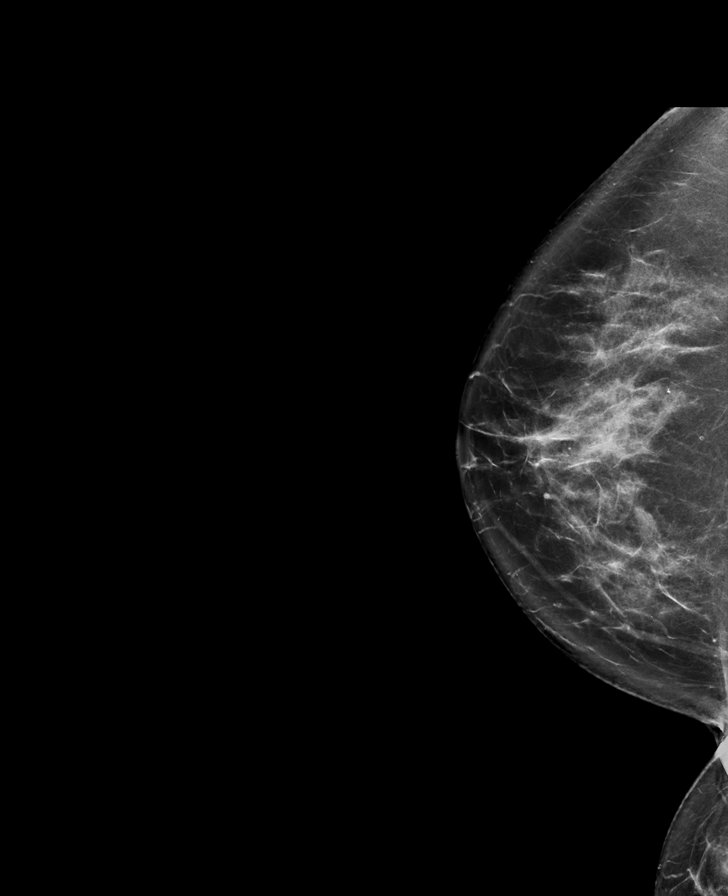

[R CC tomo · tomo slice 43/86.0]
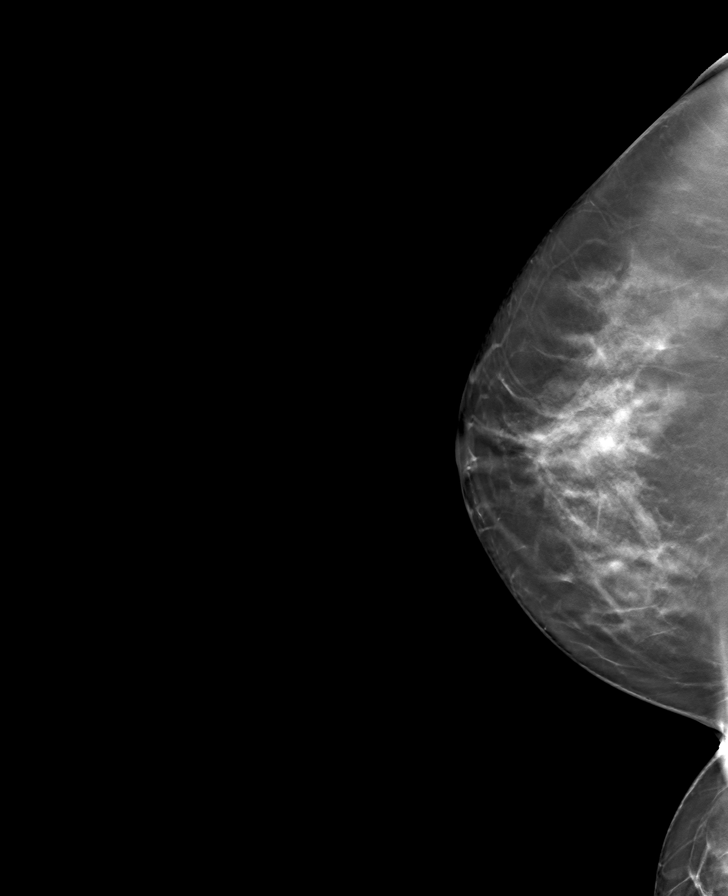

[L CC tomo · tomo slice 46/91.0]
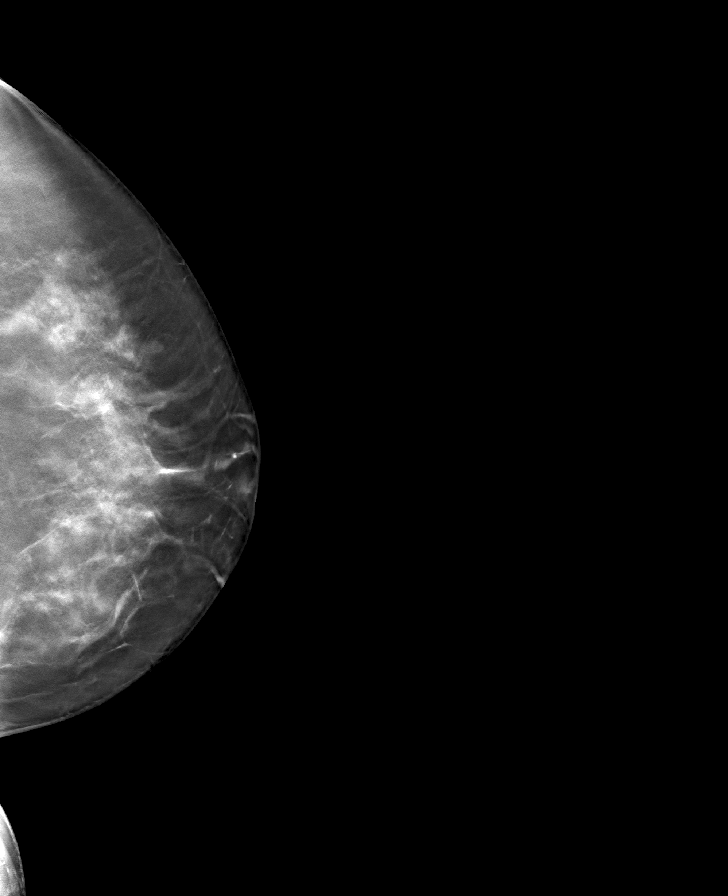

[L MLO tomo · tomo slice 45/88.0]
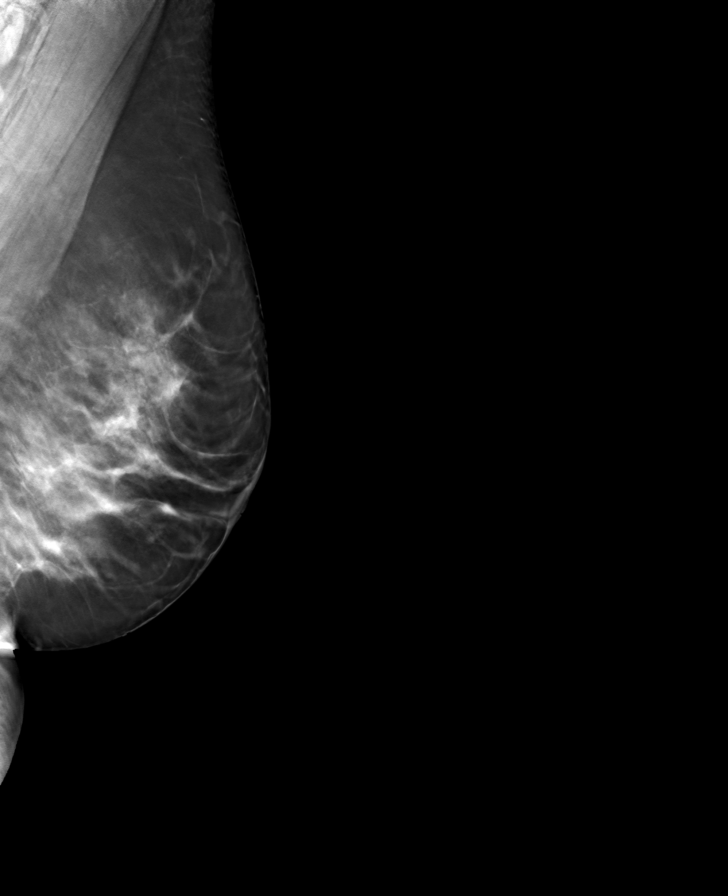

[R MLO tomo · tomo slice 43/85.0]
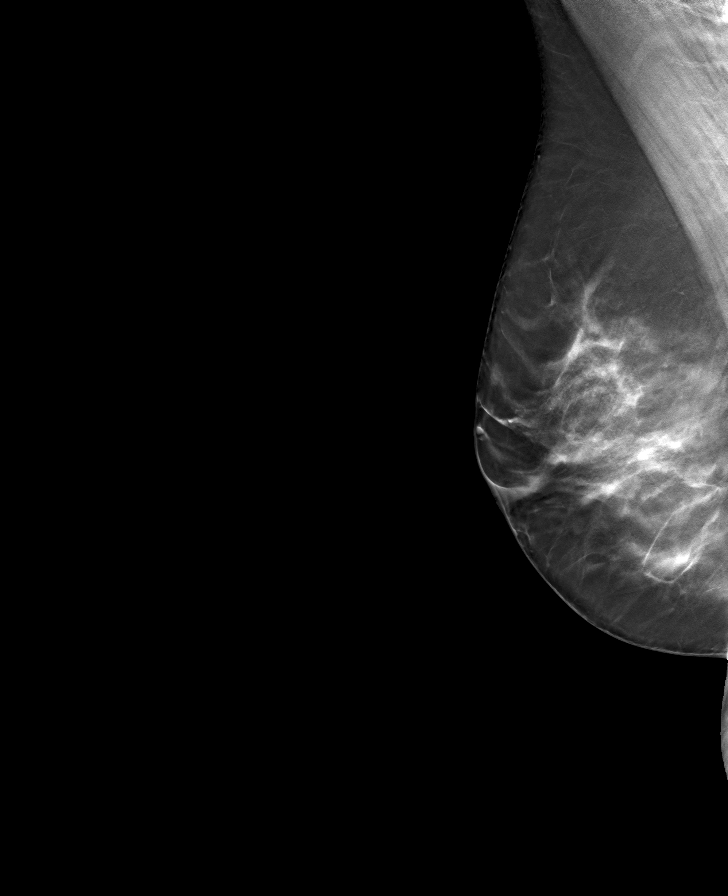

[8 of 24 positions shown; findings below may reference images not displayed]

ACR Breast Density Category c: The breast tissue is heterogeneously
dense, which may obscure small masses.
FINDINGS: There are no findings suspicious for malignancy.
IMPRESSION: No mammographic evidence of malignancy. A result letter of this
screening mammogram will be mailed directly to the patient.

RECOMMENDATION:
Screening mammogram in one year. (Code:[V2])

BI-RADS CATEGORY  1: Negative.

## 2021-11-25 DIAGNOSIS — R42 Dizziness and giddiness: Secondary | ICD-10-CM | POA: Diagnosis not present

## 2021-12-03 DIAGNOSIS — R42 Dizziness and giddiness: Secondary | ICD-10-CM | POA: Diagnosis not present

## 2021-12-03 DIAGNOSIS — H832X3 Labyrinthine dysfunction, bilateral: Secondary | ICD-10-CM | POA: Diagnosis not present

## 2021-12-10 ENCOUNTER — Other Ambulatory Visit: Payer: Self-pay | Admitting: Endocrinology

## 2021-12-23 DIAGNOSIS — G43719 Chronic migraine without aura, intractable, without status migrainosus: Secondary | ICD-10-CM | POA: Diagnosis not present

## 2021-12-23 DIAGNOSIS — Z049 Encounter for examination and observation for unspecified reason: Secondary | ICD-10-CM | POA: Diagnosis not present

## 2021-12-23 DIAGNOSIS — Z79899 Other long term (current) drug therapy: Secondary | ICD-10-CM | POA: Diagnosis not present

## 2021-12-23 DIAGNOSIS — G43839 Menstrual migraine, intractable, without status migrainosus: Secondary | ICD-10-CM | POA: Diagnosis not present

## 2021-12-23 DIAGNOSIS — G43119 Migraine with aura, intractable, without status migrainosus: Secondary | ICD-10-CM | POA: Diagnosis not present

## 2021-12-23 LAB — HEPATIC FUNCTION PANEL
ALT: 36 U/L — AB (ref 7–35)
AST: 66 — AB (ref 13–35)
Alkaline Phosphatase: 68 (ref 25–125)

## 2021-12-23 LAB — BASIC METABOLIC PANEL
BUN: 7 (ref 4–21)
Chloride: 107 (ref 99–108)
Creatinine: 0.6 (ref <=1.1)
Glucose: 92
Potassium: 5.2 mEq/L — AB (ref 3.5–5.1)
Sodium: 142 (ref 137–147)

## 2021-12-23 LAB — LIPID PANEL
HDL: 47 (ref 35–70)
LDL Cholesterol: 86
Triglycerides: 194 — AB (ref 40–160)

## 2021-12-23 LAB — COMPREHENSIVE METABOLIC PANEL
Albumin: 3.5 (ref 3.5–5.0)
Calcium: 9.9 (ref 8.7–10.7)

## 2021-12-30 DIAGNOSIS — G43839 Menstrual migraine, intractable, without status migrainosus: Secondary | ICD-10-CM | POA: Diagnosis not present

## 2021-12-30 DIAGNOSIS — G43119 Migraine with aura, intractable, without status migrainosus: Secondary | ICD-10-CM | POA: Diagnosis not present

## 2021-12-30 DIAGNOSIS — M542 Cervicalgia: Secondary | ICD-10-CM | POA: Diagnosis not present

## 2021-12-30 DIAGNOSIS — G43719 Chronic migraine without aura, intractable, without status migrainosus: Secondary | ICD-10-CM | POA: Diagnosis not present

## 2022-01-14 ENCOUNTER — Encounter: Payer: Self-pay | Admitting: Allergy & Immunology

## 2022-01-14 ENCOUNTER — Ambulatory Visit: Payer: BC Managed Care – PPO | Admitting: Allergy & Immunology

## 2022-01-14 ENCOUNTER — Other Ambulatory Visit: Payer: Self-pay

## 2022-01-14 VITALS — BP 114/76 | HR 62 | Resp 17

## 2022-01-14 DIAGNOSIS — J3089 Other allergic rhinitis: Secondary | ICD-10-CM

## 2022-01-14 DIAGNOSIS — M542 Cervicalgia: Secondary | ICD-10-CM | POA: Diagnosis not present

## 2022-01-14 DIAGNOSIS — J302 Other seasonal allergic rhinitis: Secondary | ICD-10-CM

## 2022-01-14 DIAGNOSIS — G43119 Migraine with aura, intractable, without status migrainosus: Secondary | ICD-10-CM | POA: Diagnosis not present

## 2022-01-14 DIAGNOSIS — J331 Polypoid sinus degeneration: Secondary | ICD-10-CM | POA: Diagnosis not present

## 2022-01-14 DIAGNOSIS — G43719 Chronic migraine without aura, intractable, without status migrainosus: Secondary | ICD-10-CM | POA: Diagnosis not present

## 2022-01-14 DIAGNOSIS — R42 Dizziness and giddiness: Secondary | ICD-10-CM

## 2022-01-14 DIAGNOSIS — G43839 Menstrual migraine, intractable, without status migrainosus: Secondary | ICD-10-CM | POA: Diagnosis not present

## 2022-01-14 MED ORDER — LEVOCETIRIZINE DIHYDROCHLORIDE 5 MG PO TABS: 5.0000 mg | ORAL_TABLET | Freq: Every evening | ORAL | 5 refills | Status: DC

## 2022-01-14 MED ORDER — XHANCE 93 MCG/ACT NA EXHU: 1.0000 | INHALANT_SUSPENSION | Freq: Every day | NASAL | 5 refills | Status: DC | PRN

## 2022-01-14 NOTE — Progress Notes (Signed)
? ?FOLLOW UP ? ?Date of Service/Encounter:  01/14/22 ? ? ?Assessment:  ? ?Seasonal and perennial allergic rhinitis (grasses, ragweed, weeds, trees, indoor molds, outdoor molds, cat, and dog) ?  ?Chronic migraines - now receiving therapy for this  ? ?Plan/Recommendations:  ? ?1. Seasonal and perennial allergic rhinitis - with overlying migraines (grasses, ragweed, weeds, trees, indoor molds, outdoor molds, cat, and dog) ?- We are going to make some changes to see how you do with those.  ?- Start taking: Claritin in the morning and Xyzal at night ?- Continue taking: Xhance (fluticasone) 1-2 sprays per nostril daily ?- You can use an extra dose of the antihistamine, if needed, for breakthrough symptoms.  ?- Consider nasal saline rinses 1-2 times daily to remove allergens from the nasal cavities as well as help with mucous clearance (this is especially helpful to do before the nasal sprays are given) ?- Consider allergy shots as a means of long-term control. ?- Allergy shots "re-train" and "reset" the immune system to ignore environmental allergens and decrease the resulting immune response to those allergens (sneezing, itchy watery eyes, runny nose, nasal congestion, etc).    ?- Allergy shots improve symptoms in 75-85% of patients.  ?- Information on allergy shots provided. ?- Call your insurance company to check on copays.  ? ?2. Vertigo ?- Continue to follow with Dr. Benjamine Mola. ? ?3. Return in about 4 months (around 05/16/2022).  ? ? ?Subjective:  ? ?Sarah Moran is a 41 y.o. female presenting today for follow up of  ?Chief Complaint  ?Patient presents with  ? Allergic Rhinitis   ?  Runny nose, itchy eyes, everything about the same. [name] being taken 1 daily.  ? ? ?Sarah Moran has a history of the following: ?Patient Active Problem List  ? Diagnosis Date Noted  ? Vertigo 07/19/2021  ? Seasonal and perennial allergic rhinitis 06/04/2021  ? 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 Trapezius strain 11/06/2020  ?  Sleeping difficulty 11/06/2020  ? Subcutaneous nodule 09/24/2020  ? Medication side effect 07/31/2020  ? Family history of skin cancer   ? Family history of cervical cancer   ? Family history of throat cancer   ? ANA positive 11/06/2019  ? Left elbow pain 11/06/2019  ? Arthralgia 09/30/2019  ? Allergic rhinitis 05/06/2019  ? Constipation   ? Calculus of gallbladder without cholecystitis without obstruction   ? Breast pain, right 08/16/2018  ? Prolapse of female pelvic organs 08/02/2018  ? Rectocele 08/02/2018  ? H. pylori infection 06/19/2018  ? Dyspepsia   ? Muscle ache 04/23/2018  ? Paresthesia 04/23/2018  ? Anxiety and depression 04/23/2018  ? Tension headache 04/23/2018  ? Hyperlipidemia 05/02/2017  ? Family history of breast cancer 12/15/2016  ? Routine general medical examination at a health care facility 04/01/2016  ? History of thyroid cancer 06/27/2014  ? Hypothyroidism 06/27/2014  ? Migraine 09/23/2013  ? ? ?History obtained from: chart review and patient. ? ?Sarah Moran is a 41 y.o. female presenting for a follow up visit.  She was last seen in October 2022.  At that time, we recommended using Afrin twice daily for max of 3 to 5 days before using Xhance to help to get further into her nasal cavity.  We continued with alternating antihistamines up to twice a day.  We also continued with the [facility].  We talked about allergy shots.  We referred her to Dr. Benjamine Mola for evaluation of her vertigo. ? ?Since last visit, she has been about  the same.  She was using her [name] twice a day but this seemed to make her headaches worse.  She changed it to once a day and this did not control her symptoms as well, but she did get the headaches, so she is doing this once a day instead.  She is not using any of the other nasal sprays because they made her headaches worse. ? ?Regarding her antihistamines, Allegra made her too sleepy so she stopped that.  Xyzal does the trick, but she does get sleepy if she takes it in the morning.   She takes only 1 at night now.  She was on Claritin which seems to be working a little bit.  However, she has not tried taking 2 antihistamines daily.  Allegra did not work.  Montelukast was tried, but it made her feel pretty crazy and she stopped it.  Benadryl of course makes her sleep a lot. ? ?She does do nasal saline rinses daily.  This time of the year has been particularly bad because she does a lot outdoors with her church.  She also plays golf once a week and all the grass makes her symptoms particularly bad.  She does not want to live indoors, however. ? ?She has never been on allergy shots but is interested. ? ?She did recently start injections into her head for migraine treatment.  She had those done this morning in fact. ? ?Otherwise, there have been no changes to her past medical history, surgical history, family history, or social history. ? ? ? ?Review of Systems  ?Constitutional: Negative.  Negative for chills, fever, malaise/fatigue and weight loss.  ?HENT:  Positive for congestion and sinus pain. Negative for ear discharge and ear pain.   ?Eyes:  Negative for pain, discharge and redness.  ?Respiratory:  Negative for cough, sputum production, shortness of breath and wheezing.   ?Cardiovascular: Negative.  Negative for chest pain and palpitations.  ?Gastrointestinal:  Negative for abdominal pain, constipation, diarrhea, heartburn, nausea and vomiting.  ?Skin: Negative.  Negative for itching and rash.  ?Neurological:  Negative for dizziness and headaches.  ?Endo/Heme/Allergies:  Positive for environmental allergies. Does not bruise/bleed easily.   ? ? ? ?Objective:  ? ?Blood pressure 114/76, pulse 62, resp. rate 17, SpO2 100 %. ?There is no height or weight on file to calculate BMI. ? ? ? ?Physical Exam ?Vitals reviewed.  ?Constitutional:   ?   Appearance: She is well-developed.  ?   Comments: Pleasant.  ?HENT:  ?   Head: Normocephalic and atraumatic.  ?   Right Ear: Tympanic membrane, ear canal and  external ear normal.  ?   Left Ear: Tympanic membrane, ear canal and external ear normal.  ?   Nose: No nasal deformity, septal deviation, mucosal edema or rhinorrhea.  ?   Right Turbinates: Enlarged, swollen and pale.  ?   Left Turbinates: Enlarged, swollen and pale.  ?   Right Sinus: No maxillary sinus tenderness or frontal sinus tenderness.  ?   Left Sinus: No maxillary sinus tenderness or frontal sinus tenderness.  ?   Comments: No polyps. ?   Mouth/Throat:  ?   Mouth: Mucous membranes are not pale and not dry.  ?   Pharynx: Uvula midline.  ?Eyes:  ?   General: Lids are normal. No allergic shiner.    ?   Right eye: No discharge.     ?   Left eye: No discharge.  ?   Conjunctiva/sclera: Conjunctivae normal.  ?  Right eye: Right conjunctiva is not injected. No chemosis. ?   Left eye: Left conjunctiva is not injected. No chemosis. ?   Pupils: Pupils are equal, round, and reactive to light.  ?Cardiovascular:  ?   Rate and Rhythm: Normal rate and regular rhythm.  ?   Heart sounds: Normal heart sounds.  ?Pulmonary:  ?   Effort: Pulmonary effort is normal. No tachypnea, accessory muscle usage or respiratory distress.  ?   Breath sounds: Normal breath sounds. No wheezing, rhonchi or rales.  ?   Comments: Moving air well in all lung fields.  No increased work of breathing. ?Chest:  ?   Chest wall: No tenderness.  ?Lymphadenopathy:  ?   Cervical: No cervical adenopathy.  ?Skin: ?   General: Skin is warm.  ?   Capillary Refill: Capillary refill takes less than 2 seconds.  ?   Coloration: Skin is not pale.  ?   Findings: No abrasion, erythema, petechiae or rash. Rash is not papular, urticarial or vesicular.  ?   Comments: No eczematous or urticarial lesions noted.  ?Neurological:  ?   Mental Status: She is alert.  ?Psychiatric:     ?   Behavior: Behavior is cooperative.  ?  ? ?Diagnostic studies: none ? ? ? ? ? ?  ?Salvatore Marvel, MD  ?[facility] ? ? ? ? ? ? ?

## 2022-01-14 NOTE — Patient Instructions (Addendum)
1. Seasonal and perennial allergic rhinitis - with overlying migraines (grasses, ragweed, weeds, trees, indoor molds, outdoor molds, cat, and dog) ?- We are going to make some changes to see how you do with those.  ?- Start taking: Claritin in the morning and Xyzal at night ?- Continue taking: Xhance (fluticasone) 1-2 sprays per nostril daily ?- You can use an extra dose of the antihistamine, if needed, for breakthrough symptoms.  ?- Consider nasal saline rinses 1-2 times daily to remove allergens from the nasal cavities as well as help with mucous clearance (this is especially helpful to do before the nasal sprays are given) ?- Consider allergy shots as a means of long-term control. ?- Allergy shots "re-train" and "reset" the immune system to ignore environmental allergens and decrease the resulting immune response to those allergens (sneezing, itchy watery eyes, runny nose, nasal congestion, etc).    ?- Allergy shots improve symptoms in 75-85% of patients.  ?- Information on allergy shots provided. ?- Call your insurance company to check on copays.  ? ?2. Vertigo ?- Continue to follow with Dr. Benjamine Mola. ? ?3. Return in about 4 months (around 05/16/2022).  ? ? ?Please inform us of any Emergency Department visits, hospitalizations, or changes in symptoms. Call us before going to the ED for breathing or allergy symptoms since we might be able to fit you in for a sick visit. Feel free to contact us anytime with any questions, problems, or concerns. ? ?It was a pleasure to see you again today! ? ?Websites that have reliable patient information: ?1. American Academy of Asthma, Allergy, and Immunology: www.aaaai.org ?2. Food Allergy Research and Education (FARE): foodallergy.org ?3. Mothers of Asthmatics: http://www.asthmacommunitynetwork.org ?4. [employer] of Allergy, Asthma, and Immunology: MonthlyElectricBill.co.uk ? ? ?COVID-19 Vaccine Information can be found at:  [email or web address].co.uk For questions related to vaccine distribution or appointments, please email [email or web address] or call [phone].  ? ?We realize that you might be concerned about having an allergic reaction to the COVID19 vaccines. To help with that concern, WE ARE OFFERING THE COVID19 VACCINES IN OUR OFFICE! Ask the front desk for dates!  ? ? ? ??Like? [place] on Facebook and Instagram for our latest updates!  ?  ? ? ?A healthy democracy works best when [employer] participate! Make sure you are registered to vote! If you have moved or changed any of your contact information, you will need to get this updated before voting! ? ?In some cases, you MAY be able to register to vote online: [email or web address] ? ? ? ? ?Allergy Shots  ? ?Allergies are the result of a chain reaction that starts in the immune system. Your immune system controls how your body defends itself. For instance, if you have an allergy to pollen, your immune system identifies pollen as an invader or allergen. Your immune system overreacts by producing antibodies called Immunoglobulin E (IgE). These antibodies travel to cells that release chemicals, causing an allergic reaction. ? ?The concept behind allergy immunotherapy, whether it is received in the form of shots or tablets, is that the immune system can be desensitized to specific allergens that trigger allergy symptoms. Although it requires time and patience, the payback can be long-term relief. ? ?How Do Allergy Shots Work? ? ?Allergy shots work much like a vaccine. Your body responds to injected amounts of a particular allergen given in increasing doses, eventually developing a resistance and tolerance to it. Allergy shots can lead to decreased, minimal or no allergy symptoms. ? ?There generally are two phases:  build-up and maintenance. Build-up often ranges from three to six months and involves  receiving injections with increasing amounts of the allergens. The shots are typically given once or twice a week, though more rapid build-up schedules are sometimes used. ? ?The maintenance phase begins when the most effective dose is reached. This dose is different for each person, depending on how allergic you are and your response to the build-up injections. Once the maintenance dose is reached, there are longer periods between injections, typically two to four weeks. ? ?Occasionally doctors give cortisone-type shots that can temporarily reduce allergy symptoms. These types of shots are different and should not be confused with allergy immunotherapy shots. ? ?Who Can Be Treated with Allergy Shots? ? ?Allergy shots may be a good treatment approach for people with allergic rhinitis (hay fever), allergic asthma, conjunctivitis (eye allergy) or stinging insect allergy.  ? ?Before deciding to begin allergy shots, you should consider: ? ? The length of allergy season and the severity of your symptoms ? Whether medications and/or changes to your environment can control your symptoms ? Your desire to avoid long-term medication use ? Time: allergy immunotherapy requires a major time commitment ? Cost: may vary depending on your insurance coverage ? ?Allergy shots for children age 7 and older are effective and often well tolerated. They might prevent the onset of new allergen sensitivities or the progression to asthma. ? ?Allergy shots are not started on patients who are pregnant but can be continued on patients who become pregnant while receiving them. In some patients with other medical conditions or who take certain common medications, allergy shots may be of risk. It is important to mention other medications you talk to your allergist.  ? ?When Will I Feel Better? ? ?Some may experience decreased allergy symptoms during the build-up phase. For others, it may take as long as 12 months on the maintenance dose. If  there is no improvement after a year of maintenance, your allergist will discuss other treatment options with you. ? ?If you aren?t responding to allergy shots, it may be because there is not enough dose of the allergen in your vaccine or there are missing allergens that were not identified during your allergy testing. Other reasons could be that there are high levels of the allergen in your environment or major exposure to non-allergic triggers like tobacco smoke. ? ?What Is the Length of Treatment? ? ?Once the maintenance dose is reached, allergy shots are generally continued for three to five years. The decision to stop should be discussed with your allergist at that time. Some people may experience a permanent reduction of allergy symptoms. Others may relapse and a longer course of allergy shots can be considered. ? ?What Are the Possible Reactions? ? ?The two types of adverse reactions that can occur with allergy shots are local and systemic. Common local reactions include very mild redness and swelling at the injection site, which can happen immediately or several hours after. A systemic reaction, which is less common, affects the entire body or a particular body system. They are usually mild and typically respond quickly to medications. Signs include increased allergy symptoms such as sneezing, a stuffy nose or hives. ? ?Rarely, a serious systemic reaction called anaphylaxis can develop. Symptoms include swelling in the throat, wheezing, a feeling of tightness in the chest, nausea or dizziness. Most serious systemic reactions develop within 30 minutes of allergy shots. This is why it is strongly recommended you wait in your doctor?s office  for 30 minutes after your injections. Your allergist is trained to watch for reactions, and his or her staff is trained and equipped with the proper medications to identify and treat them. ? ?Who Should Administer Allergy Shots? ? ?The preferred location for receiving shots  is your prescribing allergist?s office. Injections can sometimes be given at another facility where the physician and staff are trained to recognize and treat reactions, and have received instructions by your prescribing allergist. ? ? ? ? ? ? ? ?

## 2022-01-24 ENCOUNTER — Ambulatory Visit: Payer: BC Managed Care – PPO | Admitting: Family Medicine

## 2022-01-24 ENCOUNTER — Encounter: Payer: Self-pay | Admitting: Family Medicine

## 2022-01-24 VITALS — BP 90/60 | HR 91 | Temp 98.3°F | Ht 60.0 in | Wt 132.0 lb

## 2022-01-24 DIAGNOSIS — R300 Dysuria: Secondary | ICD-10-CM

## 2022-01-24 DIAGNOSIS — Z1159 Encounter for screening for other viral diseases: Secondary | ICD-10-CM

## 2022-01-24 DIAGNOSIS — E039 Hypothyroidism, unspecified: Secondary | ICD-10-CM

## 2022-01-24 DIAGNOSIS — Z8742 Personal history of other diseases of the female genital tract: Secondary | ICD-10-CM | POA: Diagnosis not present

## 2022-01-24 DIAGNOSIS — M25572 Pain in left ankle and joints of left foot: Secondary | ICD-10-CM

## 2022-01-24 DIAGNOSIS — Z2839 Other underimmunization status: Secondary | ICD-10-CM | POA: Diagnosis not present

## 2022-01-24 DIAGNOSIS — G43909 Migraine, unspecified, not intractable, without status migrainosus: Secondary | ICD-10-CM | POA: Diagnosis not present

## 2022-01-24 DIAGNOSIS — G8929 Other chronic pain: Secondary | ICD-10-CM

## 2022-01-24 LAB — URINALYSIS, MICROSCOPIC ONLY
RBC / HPF: NONE SEEN (ref 0–?)
WBC, UA: NONE SEEN (ref 0–?)

## 2022-01-24 LAB — POCT URINALYSIS DIPSTICK
Bilirubin, UA: NEGATIVE
Blood, UA: NEGATIVE
Glucose, UA: NEGATIVE
Nitrite, UA: NEGATIVE
Protein, UA: NEGATIVE
Spec Grav, UA: <=1.005 — AB (ref 1.010–1.025)
Urobilinogen, UA: 2 E.U./dL — AB
pH, UA: 7 (ref 5.0–8.0)

## 2022-01-24 MED ORDER — NITROFURANTOIN MONOHYD MACRO 100 MG PO CAPS: 100.0000 mg | ORAL_CAPSULE | Freq: Two times a day (BID) | ORAL | 0 refills | Status: DC

## 2022-01-24 NOTE — Patient Instructions (Signed)
Nice to see you. ?We will get labs today.  ?Please call your GYN to set up an appointment for the possible ovarian cyst. If you can not get in to see them please let me know so I can order an [place]. ?

## 2022-01-24 NOTE — Assessment & Plan Note (Signed)
Check TSH.  Continue Synthroid 88 mcg once daily. ?

## 2022-01-24 NOTE — Assessment & Plan Note (Signed)
Chronic issue.  She will continue to see her headache specialist. ?

## 2022-01-24 NOTE — Assessment & Plan Note (Signed)
At this point the patient is having chronic issues with her left ankle.  I suspect this is from a peroneal tendon strain.  Given the persistence we will refer her to sports medicine for further evaluation. ?

## 2022-01-24 NOTE — Assessment & Plan Note (Addendum)
Patient reports history of ovarian cyst.  She reports intermittent discomfort in the right pelvic area consistent with prior ovarian cyst pain.  She notes she is status post hysterectomy. Certainly this could be a cyst though could also be related to her urine complaints.  I encouraged her to contact her gynecologist to set up an appointment for evaluation.  Discussed if she cannot get into see them she can let me know and I can order an ultrasound.  ?

## 2022-01-24 NOTE — Assessment & Plan Note (Addendum)
Ongoing issues with this for couple of months.  Urinalysis is concerning for UTI.  We will send urine for culture and microscopy.  We will treat with Macrobid 100 mg twice daily for 5 days. ?

## 2022-01-24 NOTE — Assessment & Plan Note (Signed)
Patient wonders about hepatitis vaccination.  She does report her brother had hepatitis B and she notes that she has been told he has completed treatment for this.  We will check for hepatitis A, B, and C immunity.  If needed we can provide hepatitis A and B vaccinations. ?

## 2022-01-24 NOTE — Progress Notes (Signed)
?Tommi Rumps, MD ?Phone: [phone] ? ?Sarah Moran is a 41 y.o. female who presents today for f/u. ? ?HYPOTHYROIDISM ?Disease Monitoring ?Weight changes: weight loss with diet changes  Skin Changes: no Heat/Cold intolerance: cold ?Does report some tiredness.  ? ?Medication Monitoring ?Compliance:  taking synthroid   ?Last TSH:   ?Lab Results  ?Component Value Date  ? TSH 1.00 03/25/2021  ? ?Urinary complaints: Patient reports 2 to 3 months of urinary frequency, urgency, and burning.  She notes some intermittent right-sided pelvic discomfort that is similar to when she used to have ovarian cyst.  She notes she feels the discomfort when she does leg exercises though also feels it in the morning.  No discomfort at this time.  She notes no hematuria.  She has no vaginal discharge or bleeding.  She is status post hysterectomy.  She does note some white specks in her urine. Reports she feels the right sided discomfort more when her bladder is full.  ? ?Patient wonders about hepatitis vaccination.  She reports her brother has been treated for hepatitis B though she does not have much to do with.  She wants this as a precaution. ? ?Left ankle pain and swelling: Patient reports she rolled her ankle back in December 2022.  She notes it did not start bothering her until about a week later.  It hurts inferiorly to the lateral malleolus.  She notes it has improved though she does still have a pulling sensation in this area.  Notes wearing certain boots bother it more.  She took ibuprofen initially which was beneficial.  She has also iced it. ? ?Chronic headaches: Patient has been seeing a headache specialist.  They have her on Zonegran.  They have had her making significant diet changes as well. ? ?Social History  ? ?Tobacco Use  ?Smoking Status Never  ?Smokeless Tobacco Never  ? ? ?Current Outpatient Medications on File Prior to Visit  ?Medication Sig Dispense Refill  ? baclofen (LIORESAL) 10 MG tablet SMARTSIG:1  Tablet(s) By Mouth Twice a Week PRN    ? Cholecalciferol (VITAMIN D3) 50 MCG (2000 UT) capsule Take 2,000 Units by mouth daily.    ? Cod Liver Oil CAPS Take 1 capsule by mouth at bedtime.     ? diclofenac Sodium (VOLTAREN) 1 % GEL Apply 2 g topically 4 (four) times daily.    ? Fluticasone Propionate (XHANCE) 93 MCG/ACT EXHU Place 1-2 puffs into the nose daily as needed. 16 mL 5  ? levocetirizine (XYZAL) 5 MG tablet Take 1 tablet (5 mg total) by mouth every evening. 30 tablet 5  ? SUMAtriptan (IMITREX) 100 MG tablet TAKE HALF A TAB (50 MG) TO ONE TAB (100 MG) AT HEADACHE ONSET. REPEAT ONCE IN 2 HOURS IF NEEDED. TAKE NO MORE THAN 2 TABS IN A 24 HOURS PERIOD.    ? SYNTHROID 88 MCG tablet TAKE 1 TABLET BY MOUTH EVERY DAY BEFORE BREAKFAST 90 tablet 0  ? zonisamide (ZONEGRAN) 25 MG capsule Take 100 mg by mouth at bedtime.    ? ?No current facility-administered medications on file prior to visit.  ? ? ? ?ROS see history of present illness ? ?Objective ? ?Physical Exam ?Vitals:  ? 01/24/22 0820  ?BP: 90/60  ?Pulse: 91  ?Temp: 98.3 ?F (36.8 ?C)  ?SpO2: 99%  ? ? ?BP Readings from Last 3 Encounters:  ?01/24/22 90/60  ?01/14/22 114/76  ?07/27/21 90/60  ? ?Wt Readings from Last 3 Encounters:  ?01/24/22 132 lb (59.9 kg)  ?  07/27/21 141 lb 12.8 oz (64.3 kg)  ?07/16/21 144 lb 9.6 oz (65.6 kg)  ? ? ?Physical Exam ?Constitutional:   ?   General: She is not in acute distress. ?   Appearance: She is not diaphoretic.  ?Cardiovascular:  ?   Rate and Rhythm: Normal rate and regular rhythm.  ?   Heart sounds: Normal heart sounds.  ?Pulmonary:  ?   Effort: Pulmonary effort is normal.  ?   Breath sounds: Normal breath sounds.  ?Abdominal:  ?   General: Bowel sounds are normal. There is no distension.  ?   Palpations: Abdomen is soft. There is no mass.  ?   Tenderness: There is no abdominal tenderness.  ?Musculoskeletal:  ?   Comments: Slight discomfort of the soft tissues inferior to the left lateral malleolus of the left ankle, there is some  soft tissue tenderness more proximally along the peroneal tendons as well, no discomfort on resisted eversion or inversion of her left ankle, no malleoli or tenderness, navicular tenderness, or fifth metatarsal tenderness  ?Skin: ?   General: Skin is warm and dry.  ?Neurological:  ?   Mental Status: She is alert.  ? ? ? ?Assessment/Plan: Please see individual problem list. ? ?Problem List Items Addressed This Visit   ? ? Hypothyroidism (Chronic)  ?  Check TSH.  Continue Synthroid 88 mcg once daily. ? ?  ?  ? Relevant Orders  ? TSH  ? Chronic pain of left ankle  ?  At this point the patient is having chronic issues with her left ankle.  I suspect this is from a peroneal tendon strain.  Given the persistence we will refer her to sports medicine for further evaluation. ? ?  ?  ? Relevant Orders  ? Ambulatory referral to Sports Medicine  ? Dysuria  ?  Ongoing issues with this for couple of months.  Urinalysis is concerning for UTI.  We will send urine for culture and microscopy.  We will treat with Macrobid 100 mg twice daily for 5 days. ? ?  ?  ? Relevant Medications  ? nitrofurantoin, macrocrystal-monohydrate, (MACROBID) 100 MG capsule  ? Other Relevant Orders  ? POCT Urinalysis Dipstick (Completed)  ? Urine Culture  ? Urine Microscopic  ? History of ovarian cyst  ?  Patient reports history of ovarian cyst.  She reports intermittent discomfort in the right pelvic area consistent with prior ovarian cyst pain.  She notes she is status post hysterectomy. Certainly this could be a cyst though could also be related to her urine complaints.  I encouraged her to contact her gynecologist to set up an appointment for evaluation.  Discussed if she cannot get into see them she can let me know and I can order an ultrasound.  ? ?  ?  ? Migraine - Primary  ?  Chronic issue.  She will continue to see her headache specialist. ? ?  ?  ? Need for hepatitis B screening test  ?  Patient wonders about hepatitis vaccination.  She does  report her brother had hepatitis B and she notes that she has been told he has completed treatment for this.  We will check for hepatitis A, B, and C immunity.  If needed we can provide hepatitis A and B vaccinations. ? ?  ?  ? Relevant Orders  ? Hepatitis B Core Antibody, total  ? Hepatitis B Surface AntiGEN  ? Hepatitis B surface antibody,quantitative  ? ?Other Visit Diagnoses   ? ?  Need for hepatitis C screening test      ? Relevant Orders  ? Hepatitis C Antibody  ? Hepatitis A vaccination not up to date      ? Relevant Orders  ? Hepatitis A Ab, Total  ? ?  ? ? ?Return in about 6 months (around 07/27/2022). ? ? ?Tommi Rumps, MD ?[place] ? ?

## 2022-01-25 ENCOUNTER — Encounter: Payer: Self-pay | Admitting: Family Medicine

## 2022-01-25 ENCOUNTER — Other Ambulatory Visit: Payer: Self-pay

## 2022-01-25 DIAGNOSIS — E039 Hypothyroidism, unspecified: Secondary | ICD-10-CM

## 2022-01-25 LAB — HEPATITIS A ANTIBODY, TOTAL: Hepatitis A AB,Total: REACTIVE — AB

## 2022-01-25 LAB — URINE CULTURE
MICRO NUMBER:: 13364844
Result:: NO GROWTH
SPECIMEN QUALITY:: ADEQUATE

## 2022-01-25 LAB — HEPATITIS C ANTIBODY
Hepatitis C Ab: NONREACTIVE
SIGNAL TO CUT-OFF: 0.1 (ref <1.00)

## 2022-01-25 LAB — HEPATITIS B SURFACE ANTIGEN: Hepatitis B Surface Ag: NONREACTIVE

## 2022-01-25 LAB — HEPATITIS B SURFACE ANTIBODY, QUANTITATIVE: Hep B S AB Quant (Post): <5 m[IU]/mL — ABNORMAL LOW (ref >=10)

## 2022-01-25 LAB — HEPATITIS B CORE ANTIBODY, TOTAL: Hep B Core Total Ab: NONREACTIVE

## 2022-01-25 LAB — TSH: TSH: 0.03 u[IU]/mL — ABNORMAL LOW (ref 0.35–5.50)

## 2022-01-25 MED ORDER — LEVOTHYROXINE SODIUM 75 MCG PO TABS: 75.0000 ug | ORAL_TABLET | Freq: Every day | ORAL | 1 refills | Status: DC

## 2022-01-26 ENCOUNTER — Other Ambulatory Visit: Payer: Self-pay

## 2022-01-26 ENCOUNTER — Ambulatory Visit (INDEPENDENT_AMBULATORY_CARE_PROVIDER_SITE_OTHER): Payer: BC Managed Care – PPO

## 2022-01-26 DIAGNOSIS — E039 Hypothyroidism, unspecified: Secondary | ICD-10-CM

## 2022-01-26 DIAGNOSIS — Z23 Encounter for immunization: Secondary | ICD-10-CM

## 2022-01-26 MED ORDER — LEVOTHYROXINE SODIUM 75 MCG PO TABS: 75.0000 ug | ORAL_TABLET | Freq: Every day | ORAL | 1 refills | Status: DC

## 2022-01-26 NOTE — Progress Notes (Signed)
Patient came in to for 1st dose of Heplisav B vaccine. Pt tolerated well with no signs of distress. Pt to schedule NV for second dose at check out.  ?

## 2022-01-26 NOTE — Progress Notes (Signed)
? ?  I, Peterson Lombard, LAT, ATC acting as a scribe for Lynne Leader, MD. ? ?Subjective:   ? ?CC: L ankle pain ? ?HPI: Pt is a 41 y/o female c/o L ankle pain. Pt recalls she "rolled" her ankle back in Dec 2022, but did not start having pain till a week later. Pt also enjoys golfing and L ankle will swell after golfing. Pt reports the pain has improved, but she still experiencing a "pulling" sensation in this area. Pt locates pain to the lateral aspect of the L ankle and slightly into L foot and lateral lower leg ? ?Radiates: yes ?L ankle swelling: yes ?Aggravates: certain shoes, INV ?Treatments tried: IBU, ice, heat, horse liniment ? ?Pertinent review of Systems: No fevers or chills ? ?Relevant historical information: Frequent headaches. ? ? ?Objective:   ? ?Vitals:  ? 01/27/22 1232  ?BP: 108/64  ?Pulse: 89  ?SpO2: 98%  ? ?General: Well Developed, well nourished, and in no acute distress.  ? ?MSK: Left ankle: Swollen lateral malleolus.  Normal-appearing otherwise. ?Tender palpation at peroneal tendon area posterior to lateral malleolus. ?Normal foot and ankle motion. ?Pain with resisted foot eversion. ?Strength is intact. ?Laxity with talar tilt test. ?Pulses capillary refill and sensation are intact distally. ? ?Lab and Radiology Results ? ?Diagnostic Limited MSK Ultrasound of: Left lateral ankle ?Peroneal tendons are intact however at area of swelling at posterior lateral malleolus the peroneal brevis tendon is enlarged with cysts radiographic and hypoechoic fluid surrounding the tendon consistent with tenosynovitis with possible split tear. ?Impression: Peroneal tenosynovitis. ? ? ?X-ray images left ankle obtained today personally and independently interpreted ? ?No acute fractures. ?Minimal DJD. ? ?Await formal radiology review ? ?Impression and Recommendations:   ? ?Assessment and Plan: ?41 y.o. female with left lateral ankle pain thought to be due to peroneal tenosynovitis.  Plan for eccentric exercises taught in  clinic today by ATC and compression ankle sleeve and Voltaren gel.  Recheck in 6 weeks.  Unfortunately she is not a good candidate for nitroglycerin patch protocol given her severe headaches.. ? ?PDMP not reviewed this encounter. ?Orders Placed This Encounter  ?Procedures  ? DG Ankle Complete Left  ?  Standing Status:   Future  ?  Standing Expiration Date:   01/28/2023  ?  Order Specific Question:   Reason for Exam (SYMPTOM  OR DIAGNOSIS REQUIRED)  ?  Answer:   left ankle pain  ?  Order Specific Question:   Preferred imaging location?  ?  Answer:   [facility]  ?  Order Specific Question:   Is patient pregnant?  ?  Answer:   No  ? [place] LIMITED JOINT SPACE STRUCTURES LOW LEFT(NO LINKED CHARGES)  ?  Order Specific Question:   Reason for Exam (SYMPTOM  OR DIAGNOSIS REQUIRED)  ?  Answer:   left ankle pain  ?  Order Specific Question:   Preferred imaging location?  ?  Answer:   [facility]  ? ?No orders of the defined types were placed in this encounter. ? ? ?Discussed warning signs or symptoms. Please see discharge instructions. Patient expresses understanding. ? ? ?The above documentation has been reviewed and is accurate and complete Lynne Leader, M.D. ? ?

## 2022-01-27 ENCOUNTER — Ambulatory Visit: Payer: Self-pay

## 2022-01-27 ENCOUNTER — Ambulatory Visit: Payer: BC Managed Care – PPO | Admitting: Family Medicine

## 2022-01-27 ENCOUNTER — Ambulatory Visit (INDEPENDENT_AMBULATORY_CARE_PROVIDER_SITE_OTHER): Payer: BC Managed Care – PPO

## 2022-01-27 VITALS — BP 108/64 | HR 89 | Ht 60.0 in | Wt 131.2 lb

## 2022-01-27 DIAGNOSIS — G8929 Other chronic pain: Secondary | ICD-10-CM

## 2022-01-27 DIAGNOSIS — M25572 Pain in left ankle and joints of left foot: Secondary | ICD-10-CM | POA: Diagnosis not present

## 2022-01-27 DIAGNOSIS — M7672 Peroneal tendinitis, left leg: Secondary | ICD-10-CM | POA: Diagnosis not present

## 2022-01-27 IMAGING — DX DG ANKLE COMPLETE 3+V*L*
3 series · 3 of 3 positions shown · non-contrast
Comparison: None Available.

CLINICAL DATA: Left ankle pain. Injury from rolling ankle 6 months
ago.

EXAM:
LEFT ANKLE COMPLETE - 3+ VIEW

[ankle ap]
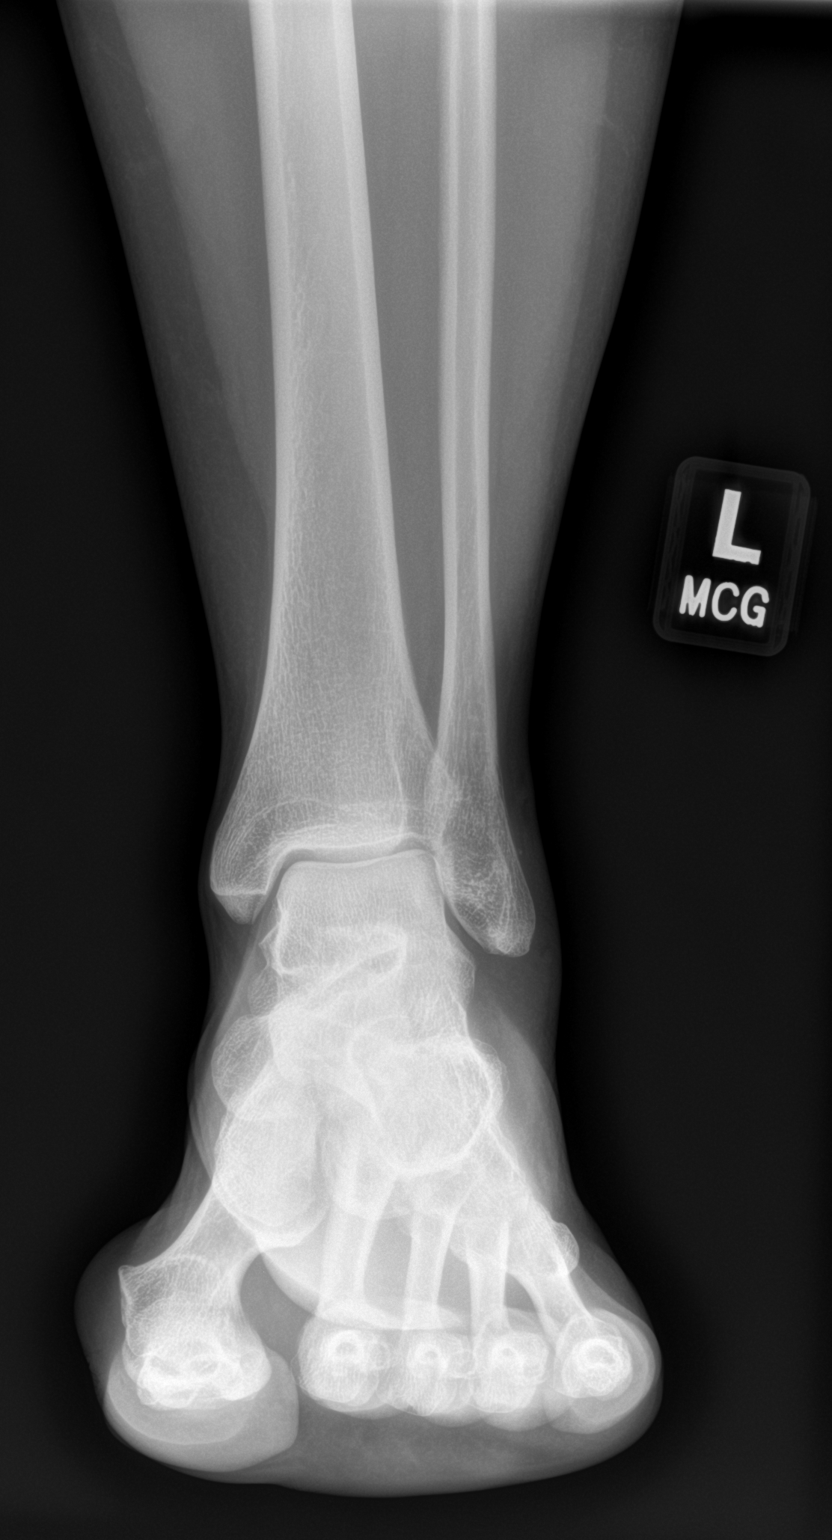

[ankle obl]
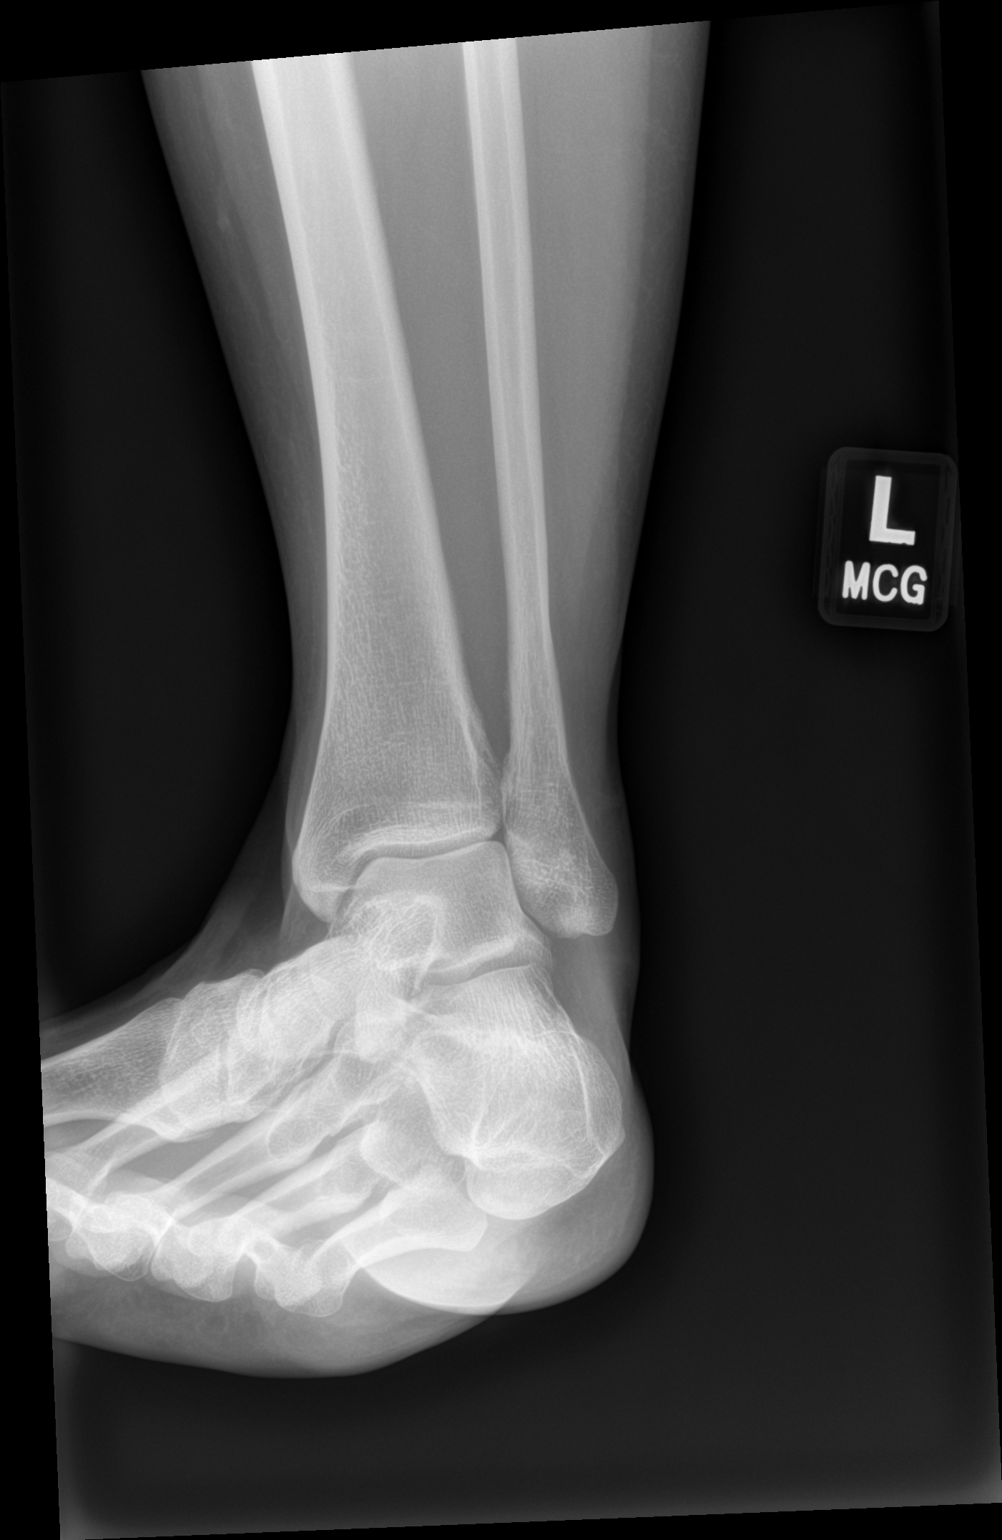

[ankle lat]
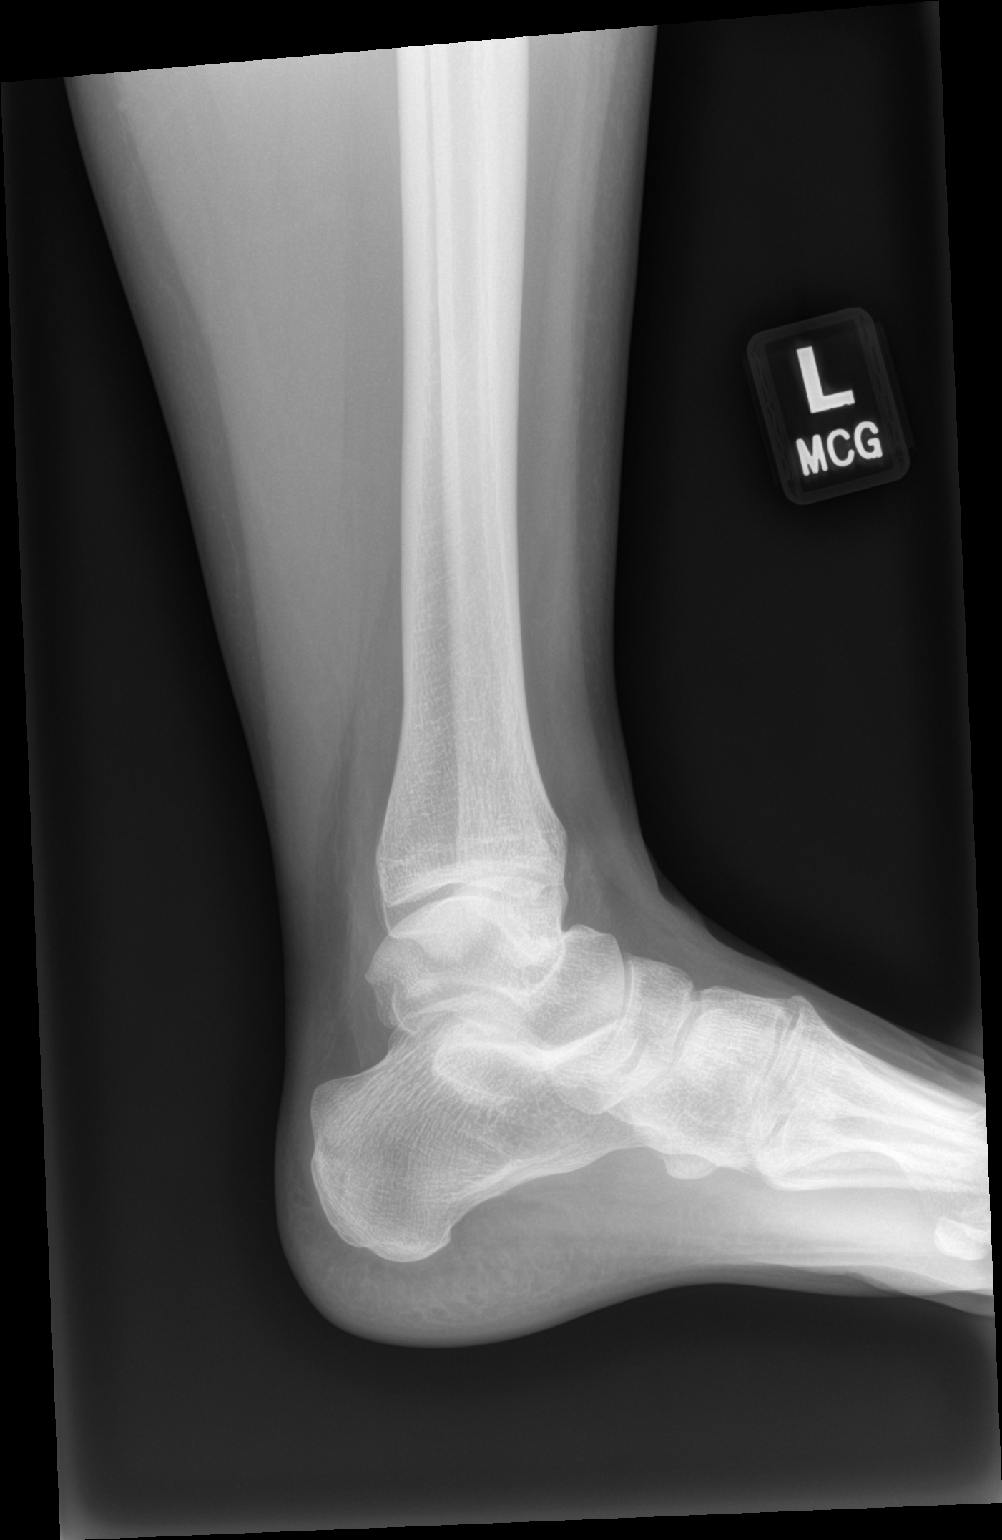

[3 of 3 positions shown; findings below may reference images not displayed]

FINDINGS: There is no evidence of fracture, dislocation, or joint effusion.
There is no evidence of arthropathy or other focal bone abnormality.
Soft tissues are unremarkable.
IMPRESSION: Negative.

## 2022-01-27 NOTE — Patient Instructions (Addendum)
Thank you for coming in today.  ? ?Please get an Xray today before you leave  ? ?Please complete the exercises that the athletic trainer went over with you:  View at [email or web address]-[email or web address] using code: [id number] ? ?Please use Voltaren gel (Generic Diclofenac Gel) up to 4x daily for pain as needed.  This is available over-the-counter as both the name brand Voltaren gel and the generic diclofenac gel.  ? ?I recommend you obtained a compression sleeve to help with your joint problems. There are many options on the market however I recommend obtaining a full ankle Body Helix compression sleeve.  You can find information (including how to appropriate measure yourself for sizing) can be found at www.Body http://www.lambert.com/.  Many of these products are health savings account (HSA) eligible.  ?You can use the compression sleeve at any time throughout the day but is most important to use while being active as well as for 2 hours post-activity.   It is appropriate to ice following activity with the compression sleeve in place.  ? ?Recheck in 6 weeks.  ? ? ?

## 2022-01-31 NOTE — Progress Notes (Signed)
Left ankle x-ray looks normal to radiology.

## 2022-02-07 ENCOUNTER — Encounter: Payer: Self-pay | Admitting: Family Medicine

## 2022-02-07 DIAGNOSIS — T148XXA Other injury of unspecified body region, initial encounter: Secondary | ICD-10-CM

## 2022-02-07 DIAGNOSIS — G43719 Chronic migraine without aura, intractable, without status migrainosus: Secondary | ICD-10-CM | POA: Diagnosis not present

## 2022-02-07 DIAGNOSIS — M542 Cervicalgia: Secondary | ICD-10-CM | POA: Diagnosis not present

## 2022-02-15 ENCOUNTER — Encounter: Payer: Self-pay | Admitting: Internal Medicine

## 2022-02-15 ENCOUNTER — Ambulatory Visit
Admission: RE | Admit: 2022-02-15 | Discharge: 2022-02-15 | Disposition: A | Discharge status: Home / Self Care | Payer: BC Managed Care – PPO | Source: Ambulatory Visit | Attending: Internal Medicine | Admitting: Internal Medicine

## 2022-02-15 ENCOUNTER — Ambulatory Visit: Payer: BC Managed Care – PPO | Admitting: Internal Medicine

## 2022-02-15 VITALS — BP 114/70 | HR 71 | Temp 98.6°F | Resp 14 | Ht 60.0 in | Wt 126.6 lb

## 2022-02-15 DIAGNOSIS — R109 Unspecified abdominal pain: Secondary | ICD-10-CM

## 2022-02-15 DIAGNOSIS — K439 Ventral hernia without obstruction or gangrene: Secondary | ICD-10-CM

## 2022-02-15 DIAGNOSIS — K429 Umbilical hernia without obstruction or gangrene: Secondary | ICD-10-CM | POA: Diagnosis not present

## 2022-02-15 DIAGNOSIS — R1084 Generalized abdominal pain: Secondary | ICD-10-CM | POA: Diagnosis not present

## 2022-02-15 DIAGNOSIS — K7689 Other specified diseases of liver: Secondary | ICD-10-CM | POA: Diagnosis not present

## 2022-02-15 LAB — COMPREHENSIVE METABOLIC PANEL
ALT: 9 U/L (ref 0–35)
AST: 14 U/L (ref 0–37)
Albumin: 4.3 g/dL (ref 3.5–5.2)
Alkaline Phosphatase: 57 U/L (ref 39–117)
BUN: 11 mg/dL (ref 6–23)
CO2: 25 mEq/L (ref 19–32)
Calcium: 9.5 mg/dL (ref 8.4–10.5)
Chloride: 106 mEq/L (ref 96–112)
Creatinine, Ser: 0.7 mg/dL (ref 0.40–1.20)
GFR: 106.36 mL/min (ref >60.00)
Glucose, Bld: 89 mg/dL (ref 70–99)
Potassium: 4 mEq/L (ref 3.5–5.1)
Sodium: 138 mEq/L (ref 135–145)
Total Bilirubin: 0.6 mg/dL (ref 0.2–1.2)
Total Protein: 6.3 g/dL (ref 6.0–8.3)

## 2022-02-15 LAB — CBC WITH DIFFERENTIAL/PLATELET
Basophils Absolute: 0 10*3/uL (ref 0.0–0.1)
Basophils Relative: 0.6 % (ref 0.0–3.0)
Eosinophils Absolute: 0.1 10*3/uL (ref 0.0–0.7)
Eosinophils Relative: 0.9 % (ref 0.0–5.0)
HCT: 40 % (ref 36.0–46.0)
Hemoglobin: 13.5 g/dL (ref 12.0–15.0)
Lymphocytes Relative: 27.5 % (ref 12.0–46.0)
Lymphs Abs: 2.1 10*3/uL (ref 0.7–4.0)
MCHC: 33.8 g/dL (ref 30.0–36.0)
MCV: 88.3 fl (ref 78.0–100.0)
Monocytes Absolute: 0.5 10*3/uL (ref 0.1–1.0)
Monocytes Relative: 6.8 % (ref 3.0–12.0)
Neutro Abs: 4.8 10*3/uL (ref 1.4–7.7)
Neutrophils Relative %: 64.2 % (ref 43.0–77.0)
Platelets: 284 10*3/uL (ref 150.0–400.0)
RBC: 4.53 Mil/uL (ref 3.87–5.11)
RDW: 13.1 % (ref 11.5–15.5)
WBC: 7.5 10*3/uL (ref 4.0–10.5)

## 2022-02-15 IMAGING — CT CT ABD-PELV W/ CM
2 of 5 series · 15 of 46 positions shown, 17 images · IV contrast (APPLIED)
Comparison: Abdominopelvic CT [DATE]

CLINICAL DATA: Flank pain, kidney stone suspected. Right lower
quadrant abdominal pain for 2 months.

EXAM:
CT ABDOMEN AND PELVIS WITH CONTRAST
TECHNIQUE: Multidetector CT imaging of the abdomen and pelvis was performed
using the standard protocol following bolus administration of
intravenous contrast.

[Series 2: axial st · axial · 0.63mm/px · z∈[-872,-458]mm · 12 of 95 slices shown, 14 images]
[im 6/95  soft-tissue]
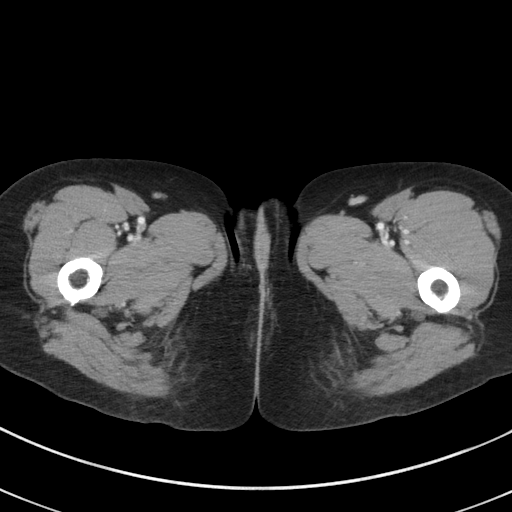
[im 6/95  bone]
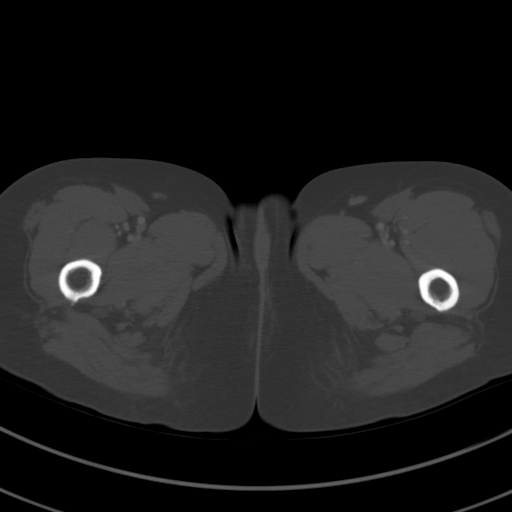
[im 16/95  soft-tissue]
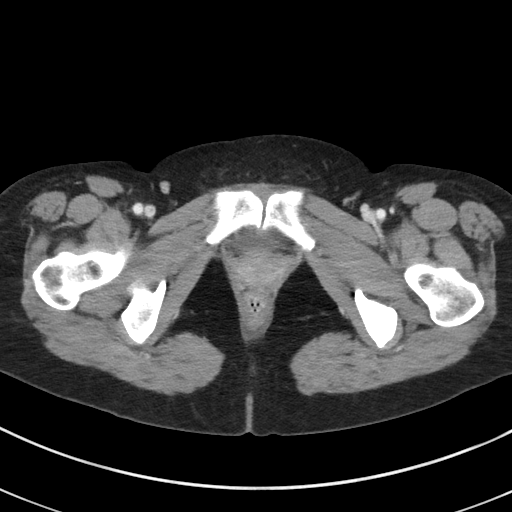
[im 21/95  soft-tissue]
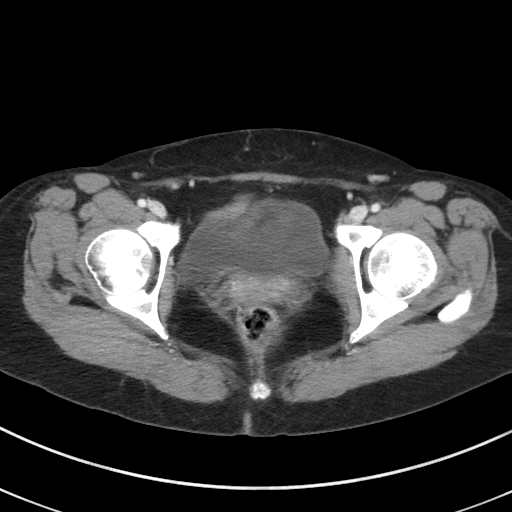
[im 27/95  soft-tissue]
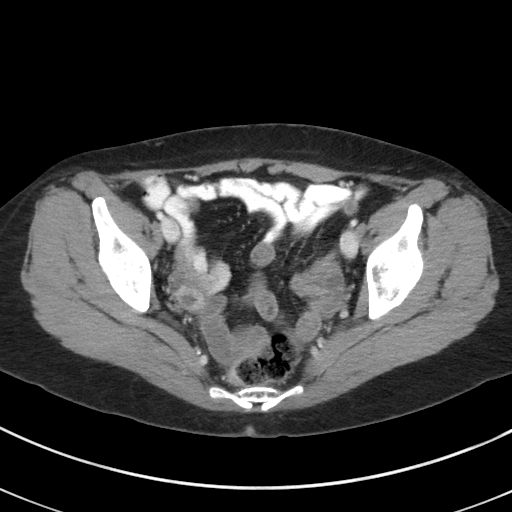
[im 37/95  soft-tissue]
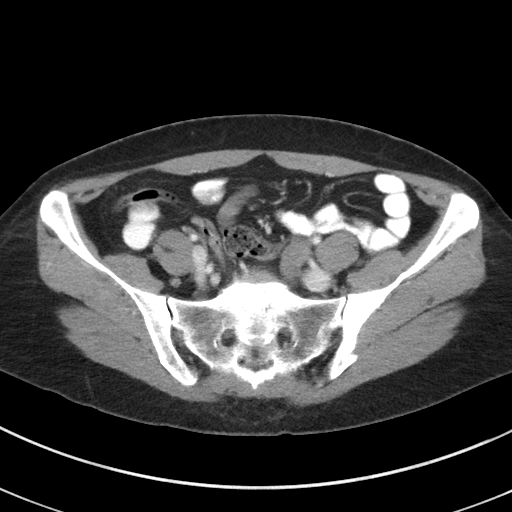
[im 42/95  soft-tissue]
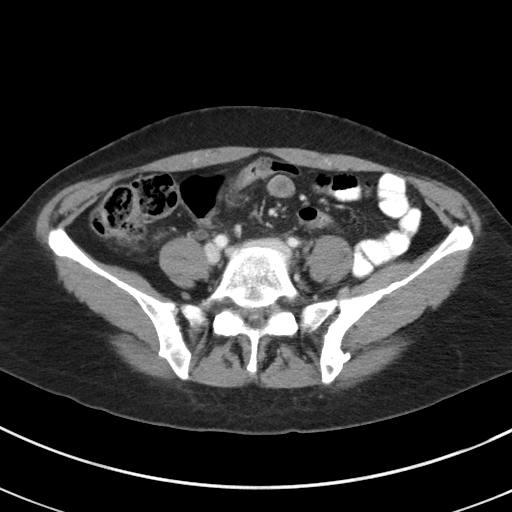
[im 53/95  soft-tissue]
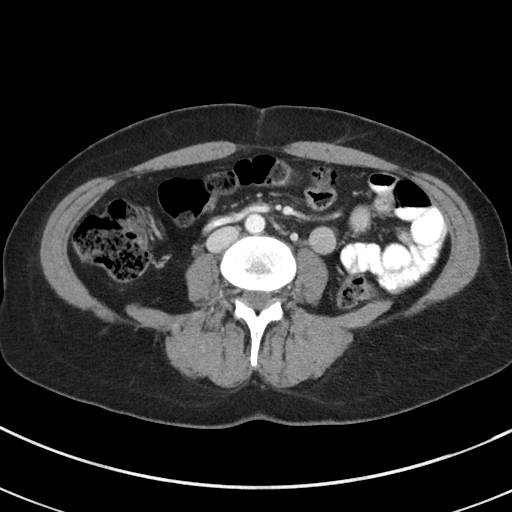
[im 58/95  soft-tissue]
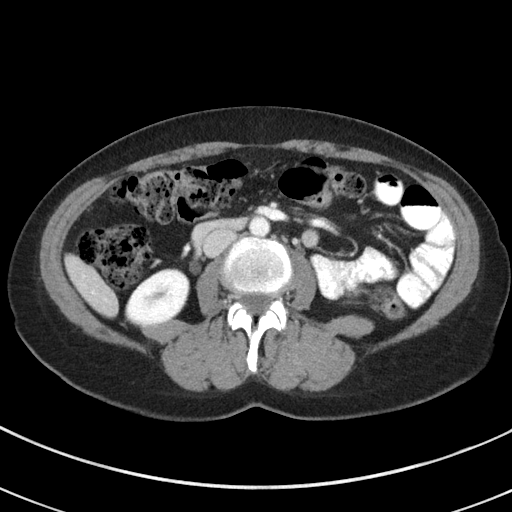
[im 68/95  soft-tissue]
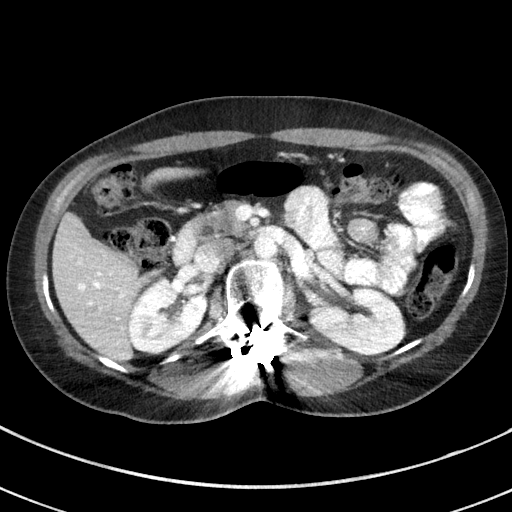
[im 68/95  bone]
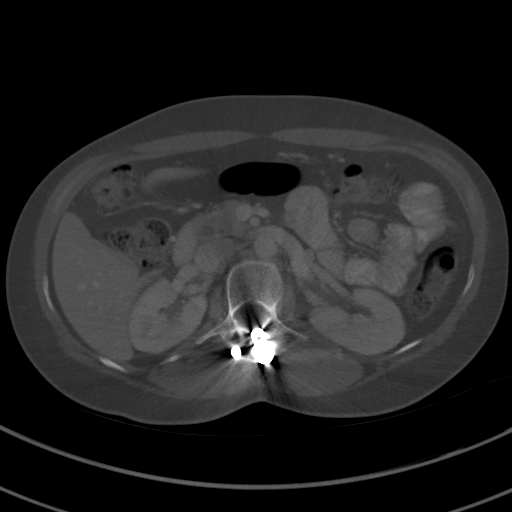
[im 74/95  soft-tissue]
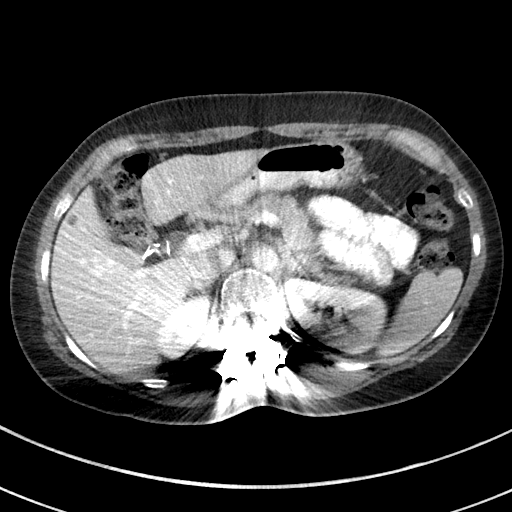
[im 79/95  soft-tissue]
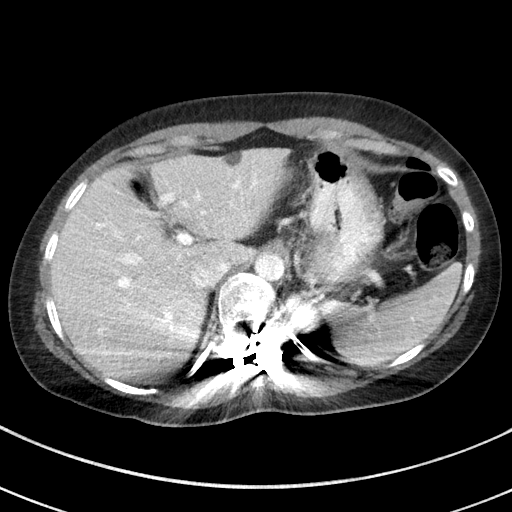
[im 89/95  soft-tissue]
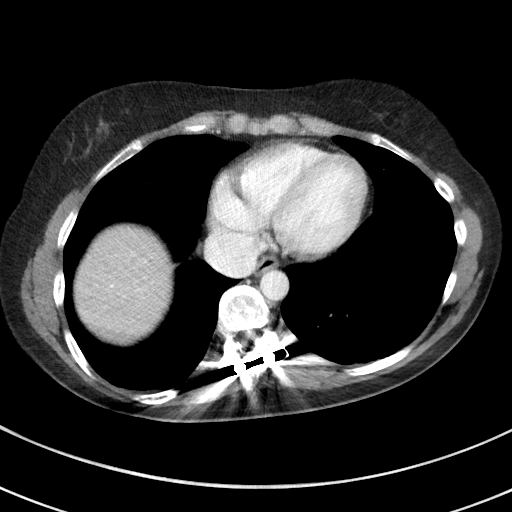

[Series 4: coronal st · coronal · 0.68mm/px · 3 of 80 slices shown]
[im 27/80  soft-tissue]
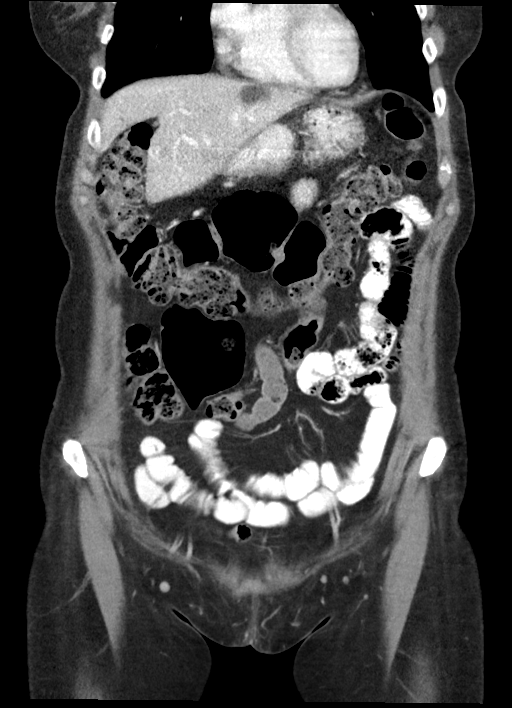
[im 36/80  soft-tissue]
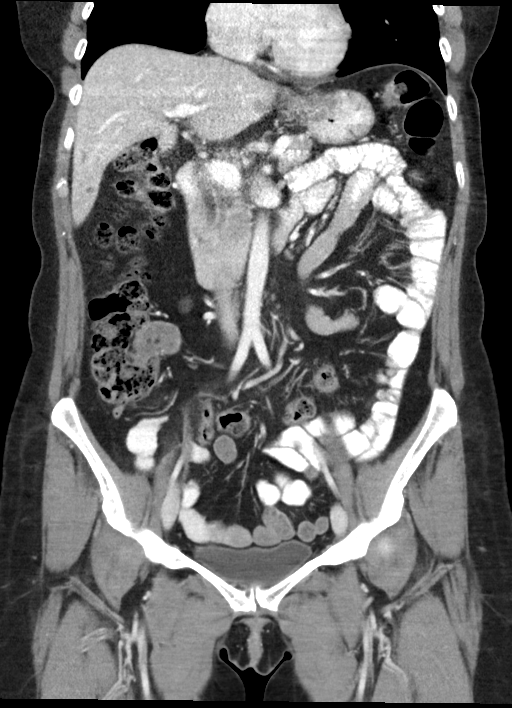
[im 44/80  soft-tissue]
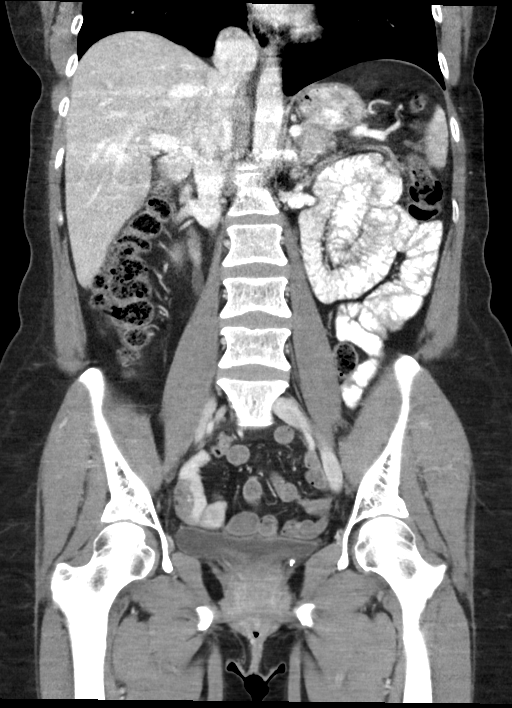

[15 of 46 positions shown; findings below may reference images not displayed]

RADIATION DOSE REDUCTION: This exam was performed according to the
departmental dose-optimization program which includes automated
exposure control, adjustment of the mA and/or kV according to
patient size and/or use of iterative reconstruction technique.

CONTRAST:  100mL OMNIPAQUE IOHEXOL 300 MG/ML  SOLN
FINDINGS: Lower chest: Clear lung bases. No significant pleural or pericardial
effusion.

Hepatobiliary: The liver is normal in density without suspicious
focal abnormality. Scattered hepatic cysts are similar to the
previous study. There is stable mild extrahepatic biliary dilatation
status post cholecystectomy, likely physiologic.

Pancreas: Unremarkable. No pancreatic ductal dilatation or
surrounding inflammatory changes.

Spleen: Normal in size without focal abnormality.

Adrenals/Urinary Tract: Both adrenal glands appear normal. The
kidneys appear normal without evidence of urinary tract calculus,
suspicious lesion or hydronephrosis. The bladder appears
unremarkable for its degree of distention.

Stomach/Bowel: Enteric contrast was administered and has passed into
the distal small bowel. The stomach appears unremarkable for its
degree of distension. No evidence of bowel wall thickening,
distention or surrounding inflammatory change. The appendix appears
normal.

Vascular/Lymphatic: There are no enlarged abdominal or pelvic lymph
nodes. No significant vascular findings.

Reproductive: Status post hysterectomy with probable residual
ovarian tissue bilaterally. No suspicious adnexal findings.

Other: Small superimposed local hernia containing only fat has
mildly enlarged compared with the prior study, measuring up to
cm on image 41/2. No herniated bowel. No ascites, free air or focal
extraluminal fluid collection.

Musculoskeletal: No acute or significant osseous findings. Previous
thoracolumbar fusion, incompletely visualized. Mild lumbar
spondylosis. Postsurgical changes in the left iliac bone.
IMPRESSION: 1. No acute findings or explanation for the patient's symptoms. No
evidence of urinary tract calculus, hydronephrosis or appendicitis.
2. Stable mild extrahepatic biliary dilatation status post
cholecystectomy, likely physiologic.
3. Enlarging small supraumbilical hernia containing only fat.
4. These results will be called to the ordering clinician or
representative by the Radiologist Assistant, and communication
documented in the PACS or [REDACTED].

## 2022-02-15 MED ORDER — IOHEXOL 300 MG/ML  SOLN
100.0000 mL | Freq: Once | INTRAMUSCULAR | Status: AC | PRN
  Administered 2022-02-15: 100 mL via INTRAVENOUS

## 2022-02-15 NOTE — Addendum Note (Signed)
Addended by: Orland Mustard on: 02/15/2022 10:41 AM   Modules accepted: Orders

## 2022-02-15 NOTE — Progress Notes (Signed)
Chief Complaint  Patient presents with   Abdominal Pain    Ongoing for 1 mon, sharp pain hurts to move, R side of abdomen pain comes and goes. Is nauseated denied any V/D stool are soft and mushy, ? constipated   Acute visit  1. X 1.5 to 2 months 8/10 sharp pain RLQ hurts to move, food at times makes worse I.e sub, has ibs-C, at times feels pulling like gas right lower ab radiating to belly button and Right mid abdomen with nausea w/o vomiting, stools soft and mushy urine culture 01/2022 negative.  Pain is severe and sharp, at times hurts to pee and having lower back pain sometimes not peeing at all, at times feels gas and bloating. She is able to pass gas. Lying down on the left side hurts on the right and hurts right upper abdomen s/p GB removal. Pain had this am which had her bending over. Appetite comes and goes. Lost 20 lbs since 12/23/21 but has changed diet due to seeing [facility]    Review of Systems  Constitutional:  Negative for weight loss.  HENT:  Negative for hearing loss.   Eyes:  Negative for blurred vision.  Respiratory:  Negative for shortness of breath.   Cardiovascular:  Negative for chest pain.  Gastrointestinal:  Positive for abdominal pain, constipation and nausea. Negative for blood in stool.  Genitourinary:  Negative for dysuria.  Musculoskeletal:  Negative for falls and joint pain.  Skin:  Negative for rash.  Neurological:  Negative for headaches.  Psychiatric/Behavioral:  Negative for depression.   Past Medical History:  Diagnosis Date   Allergy    Anal fissure    Anxiety    no meds   Chronic pelvic pain in female    Family history of breast cancer    My Risk neg 5/18   Family history of cervical cancer    Family history of skin cancer    Family history of throat cancer    Frequent headaches    Gallstone    Gallstones    Genetic testing of female 01/2017   My Risk/BRCA neg   GERD (gastroesophageal reflux disease)    well controlled. Takes  gingerroot when symptoms   Hypothyroidism    s/p thyroidectomy - levothyroxine 75 mcg daily   IBS (irritable bowel syndrome)    constipation - takes miralax daily   Increased risk of breast cancer 01/2017   IBIS=18.8%/riskscore=21.9%   Liver cyst 04/2018   Migraine    takes excedrin. She has tried imitrex in the past    Personal history of radiation therapy    Seizure ([place]) 1997    x 1-age 62-after surgery for scoliosis   Thyroid cancer ([place]) 2007   s/p thyroidectomy   Past Surgical History:  Procedure Laterality Date   ANAL RECTAL MANOMETRY N/A 10/12/2018   Procedure: ANO RECTAL MANOMETRY;  Surgeon: Mauri Pole, MD;  Location: [facility];  Service: Endoscopy;  Laterality: N/A;   ANTERIOR AND POSTERIOR REPAIR WITH SACROSPINOUS FIXATION N/A 08/02/2018   Procedure: ANTERIOR AND POSTERIOR REPAIR;  Surgeon: Homero Fellers, MD;  Location: [facility];  Service: Gynecology;  Laterality: N/A;   BACK SURGERY  1997   scoliosis   CHOLECYSTECTOMY N/A 09/21/2018   Procedure: LAPAROSCOPIC CHOLECYSTECTOMY;  Surgeon: Fredirick Maudlin, MD;  Location: [facility];  Service: General;  Laterality: N/A;   COLONOSCOPY     ESOPHAGOGASTRODUODENOSCOPY (EGD) WITH PROPOFOL N/A 06/12/2018   Procedure: ESOPHAGOGASTRODUODENOSCOPY (EGD) WITH PROPOFOL;  Surgeon: Lin Landsman, MD;  Location: [facility] END[facility];  Service: Gastroenterology;  Laterality: N/A;   HERNIA REPAIR  1990   bilateral inguinal   PUBOVAGINAL SLING N/A 08/02/2018   Procedure: PUBO-VAGINAL SLING- TVT;  Surgeon: Homero Fellers, MD;  Location: [facility] OR[facility];  Service: Gynecology;  Laterality: N/A;   THYROIDECTOMY  2007   thyroid cancer   TOTAL ABDOMINAL HYSTERECTOMY  2008   dymenorrhea   Family History  Problem Relation Age of Onset   Hyperlipidemia Father    Hypertension Father    Clotting disorder Sister    Hepatitis B Brother    Breast cancer Paternal Grandmother 71       with mets   Cervical cancer Mother        dx. in  her early 78s   Breast cancer Paternal Aunt 39   Throat cancer Paternal Aunt 27   Breast cancer Paternal Aunt 47   Breast cancer Paternal Aunt        26   Skin cancer Paternal Aunt    Cancer Cousin        unknown type dx. in her late 20s/early 40s   Skin cancer Cousin        97 cancerous moles removed in her 44s   Social History   Socioeconomic History   Marital status: Married    Spouse name: Audelia Acton   Number of children: 2   Years of education: 12   Highest education level: Not on file  Occupational History   Occupation: Stay at home mom  Tobacco Use   Smoking status: Never   Smokeless tobacco: Never  Vaping Use   Vaping Use: Never used  Substance and Sexual Activity   Alcohol use: Not Currently   Drug use: No   Sexual activity: Yes    Birth control/protection: Surgical    Comment: Hysterectomy  Other Topics Concern   Not on file  Social History Narrative   Sevyn grew up in [place]. She currently lives in [place] with her husband, Audelia Acton, and their 2 sons ([place]  and [place] ). 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 Aveya belongs to the [employer]. She enjoys outdoor activities on her spare time.   Social Determinants of Health   Financial Resource Strain: Not on file  Food Insecurity: Not on file  Transportation Needs: Not on file  Physical Activity: Not on file  Stress: Not on file  Social Connections: Not on file  Intimate Partner Violence: Not on file   Current Meds  Medication Sig   baclofen (LIORESAL) 10 MG tablet SMARTSIG:1 Tablet(s) By Mouth Twice a Week PRN   Cholecalciferol (VITAMIN D3) 50 MCG (2000 UT) capsule Take 2,000 Units by mouth daily.   Cod Liver Oil CAPS Take 1 capsule by mouth at bedtime.    diclofenac Sodium (VOLTAREN) 1 % GEL Apply 2 g topically 4 (four) times daily.   Fluticasone Propionate (XHANCE) 93 MCG/ACT EXHU Place 1-2 puffs into the nose daily as needed.    levothyroxine (SYNTHROID) 75 MCG tablet Take 1 tablet (75 mcg total) by mouth daily.   SUMAtriptan (IMITREX) 100 MG tablet TAKE HALF A TAB (50 MG) TO ONE TAB (100 MG) AT HEADACHE ONSET. REPEAT ONCE IN 2 HOURS IF NEEDED. TAKE NO MORE THAN 2 TABS IN A 24 HOURS PERIOD.   zonisamide (ZONEGRAN) 25 MG capsule Take 100 mg by mouth at bedtime.   Allergies  Allergen  Reactions   Darvon [Propoxyphene] Other (See Comments)    Hallucinations    Onion Other (See Comments)    Migraines   Percocet [Oxycodone-Acetaminophen] Hives   Vicodin [Hydrocodone-Acetaminophen] Hives   Recent Results (from the past 2160 hour(s))  TSH     Status: Abnormal   Collection Time: 01/24/22  9:03 AM  Result Value Ref Range   TSH 0.03 Repeated and verified X2. (L) 0.35 - 5.50 uIU/mL  Hepatitis A Ab, Total     Status: Abnormal   Collection Time: 01/24/22  9:03 AM  Result Value Ref Range   Hepatitis A AB,Total REACTIVE (A) NON-REACTIVE    Comment: . For additional information, please refer to  http://education.[facility].com/faq/FAQ202  (This link is being provided for informational/ educational purposes only.) .   Hepatitis C Antibody     Status: None   Collection Time: 01/24/22  9:03 AM  Result Value Ref Range   Hepatitis C Ab NON-REACTIVE NON-REACTIVE   SIGNAL TO CUT-OFF 0.10 <1.00    Comment: . HCV antibody was non-reactive. There is no laboratory  evidence of HCV infection. . In most cases, no further action is required. However, if recent HCV exposure is suspected, a test for HCV RNA (test code [phone]) is suggested. . For additional information please refer to http://education.[facility].com/faq/FAQ22v1 (This link is being provided for informational/ educational purposes only.) .   Hepatitis B Core Antibody, total     Status: None   Collection Time: 01/24/22  9:03 AM  Result Value Ref Range   Hep B Core Total Ab NON-REACTIVE NON-REACTIVE  Hepatitis B Surface AntiGEN     Status: None    Collection Time: 01/24/22  9:03 AM  Result Value Ref Range   Hepatitis B Surface Ag NON-REACTIVE NON-REACTIVE  Hepatitis B surface antibody,quantitative     Status: Abnormal   Collection Time: 01/24/22  9:03 AM  Result Value Ref Range   Hepatitis B-Post <5 (L) > OR = 10 mIU/mL    Comment: . Patient does not have immunity to hepatitis B virus. . For additional information, please refer to http://education.[facility].com/faq/FAQ105 (This link is being provided for informational/ educational purposes only).   POCT Urinalysis Dipstick     Status: Abnormal   Collection Time: 01/24/22 10:08 AM  Result Value Ref Range   Color, UA clear    Clarity, UA clear    Glucose, UA Negative Negative   Bilirubin, UA negative    Ketones, UA 1+    Spec Grav, UA <=1.005 (A) 1.010 - 1.025   Blood, UA negtive    pH, UA 7.0 5.0 - 8.0   Protein, UA Negative Negative   Urobilinogen, UA 2.0 (A) 0.2 or 1.0 E.U./dL   Nitrite, UA negative    Leukocytes, UA Small (1+) (A) Negative   Appearance clear    Odor none   Urine Culture     Status: None   Collection Time: 01/24/22 11:28 AM   Specimen: Urine  Result Value Ref Range   MICRO NUMBER: [id number]    SPECIMEN QUALITY: Adequate    Sample Source NOT GIVEN    STATUS: FINAL    Result: No Growth   Urine Microscopic     Status: None   Collection Time: 01/24/22 11:28 AM  Result Value Ref Range   WBC, UA none seen 0-2/hpf   RBC / HPF none seen 0-2/hpf   Squamous Epithelial / LPF Rare(0-4/hpf) Rare(0-4/hpf)   Objective  Body mass index is 24.72 kg/m. Wt Readings from Last 3  Encounters:  02/15/22 126 lb 9.6 oz (57.4 kg)  01/27/22 131 lb 3.2 oz (59.5 kg)  01/24/22 132 lb (59.9 kg)   Temp Readings from Last 3 Encounters:  02/15/22 98.6 F (37 C) (Oral)  01/24/22 98.3 F (36.8 C) (Oral)  07/27/21 98.6 F (37 C) (Oral)   BP Readings from Last 3 Encounters:  02/15/22 114/70  01/27/22 108/64  01/24/22 90/60   Pulse Readings from Last 3  Encounters:  02/15/22 71  01/27/22 89  01/24/22 91    Physical Exam Vitals and nursing note reviewed.  Constitutional:      Appearance: Normal appearance. She is well-developed and well-groomed.  HENT:     Head: Normocephalic and atraumatic.  Eyes:     Conjunctiva/sclera: Conjunctivae normal.     Pupils: Pupils are equal, round, and reactive to light.  Cardiovascular:     Rate and Rhythm: Normal rate and regular rhythm.     Heart sounds: Normal heart sounds. No murmur heard. Pulmonary:     Effort: Pulmonary effort is normal.     Breath sounds: Normal breath sounds.  Abdominal:     General: Abdomen is flat. Bowel sounds are normal.     Tenderness: There is abdominal tenderness in the right upper quadrant, right lower quadrant and periumbilical area. There is no right CVA tenderness or left CVA tenderness.       Comments: +peritoneal signs b/l feet with tapping   Musculoskeletal:        General: No tenderness.  Skin:    General: Skin is warm and dry.  Neurological:     General: No focal deficit present.     Mental Status: She is alert and oriented to person, place, and time. Mental status is at baseline.     Cranial Nerves: Cranial nerves 2-12 are intact.     Motor: Motor function is intact.     Coordination: Coordination is intact.     Gait: Gait is intact.  Psychiatric:        Attention and Perception: Attention and perception normal.        Mood and Affect: Mood and affect normal.        Speech: Speech normal.        Behavior: Behavior normal. Behavior is cooperative.        Thought Content: Thought content normal.        Cognition and Memory: Cognition and memory normal.        Judgment: Judgment normal.    Assessment  Plan  Generalized abdominal pain severe RMQ, RLQ with peritoneal signs r/o appendicitis vs kidney stone vs ruptured ovarian cyst (pt has h/o ovarian cyst s/p hysterectomy with b/l ovaries) vs other- Plan: Comprehensive metabolic panel, CBC with  Differential/Platelet, Creatinine, CT Abdomen Pelvis W Contrast Flank pain - Plan: CT Abdomen Pelvis W Contrast   Provider: Dr. Olivia Mackie McLean-Scocuzza-Internal Medicine

## 2022-02-15 NOTE — Patient Instructions (Signed)
Abdominal Pain, Adult Pain in the abdomen (abdominal pain) can be caused by many things. Often, abdominal pain is not serious and it gets better with no treatment or by being treated at home. However, sometimes abdominal pain is serious. Your health care provider will ask questions about your medical history and do a physical exam to try to determine the cause of your abdominal pain. Follow these instructions at home: Medicines Take over-the-counter and prescription medicines only as told by your health care provider. Do not take a laxative unless told by your health care provider. General instructions  Watch your condition for any changes. Drink enough fluid to keep your urine pale yellow. Keep all follow-up visits as told by your health care provider. This is important. Contact a health care provider if: Your abdominal pain changes or gets worse. You are not hungry or you lose weight without trying. You are constipated or have diarrhea for more than 2-3 days. You have pain when you urinate or have a bowel movement. Your abdominal pain wakes you up at night. Your pain gets worse with meals, after eating, or with certain foods. You are vomiting and cannot keep anything down. You have a fever. You have blood in your urine. Get help right away if: Your pain does not go away as soon as your health care provider told you to expect. You cannot stop vomiting. Your pain is only in areas of the abdomen, such as the right side or the left lower portion of the abdomen. Pain on the right side could be caused by appendicitis. You have bloody or black stools, or stools that look like tar. You have severe pain, cramping, or bloating in your abdomen. You have signs of dehydration, such as: Dark urine, very little urine, or no urine. Cracked lips. Dry mouth. Sunken eyes. Sleepiness. Weakness. You have trouble breathing or chest pain. Summary Often, abdominal pain is not serious and it gets  better with no treatment or by being treated at home. However, sometimes abdominal pain is serious. Watch your condition for any changes. Take over-the-counter and prescription medicines only as told by your health care provider. Contact a health care provider if your abdominal pain changes or gets worse. Get help right away if you have severe pain, cramping, or bloating in your abdomen. This information is not intended to replace advice given to you by your health care provider. Make sure you discuss any questions you have with your health care provider. Document Revised: 10/25/2019 Document Reviewed: 01/14/2019 Elsevier Patient Education  [place] stands for fermentable oligosaccharides, disaccharides, monosaccharides, and polyols. These are sugars that are hard for some people to digest. A low-FODMAP eating plan may help some people who have irritable bowel syndrome (IBS) and certain other bowel (intestinal) diseases to manage their symptoms. This meal plan can be complicated to follow. Work with a diet and nutrition specialist (dietitian) to make a low-FODMAP eating plan that is right for you. A dietitian can help make sure that you get enough nutrition from this diet. What are tips for following this plan? Reading food labels Check labels for hidden FODMAPs such as: High-fructose syrup. Honey. Agave. Natural fruit flavors. Onion or garlic powder. Choose low-FODMAP foods that contain 3-4 grams of fiber per serving. Check food labels for serving sizes. Eat only one serving at a time to make sure FODMAP levels stay low. Shopping Shop with a list of foods that are recommended on this diet  and make a meal plan. Meal planning Follow a low-FODMAP eating plan for up to 6 weeks, or as told by your health care provider or dietitian. To follow the eating plan: Eliminate high-FODMAP foods from your diet completely. Choose only low-FODMAP foods to eat.  You will do this for 2-6 weeks. Gradually reintroduce high-FODMAP foods into your diet one at a time. Most people should wait a few days before introducing the next new high-FODMAP food into their meal plan. Your dietitian can recommend how quickly you may reintroduce foods. Keep a daily record of what and how much you eat and drink. Make note of any symptoms that you have after eating. Review your daily record with a dietitian regularly to identify which foods you can eat and which foods you should avoid. General tips Drink enough fluid each day to keep your urine pale yellow. Avoid processed foods. These often have added sugar and may be high in FODMAPs. Avoid most dairy products, whole grains, and sweeteners. Work with a dietitian to make sure you get enough fiber in your diet. Avoid high FODMAP foods at meals to manage symptoms. Recommended foods Fruits Bananas, oranges, tangerines, lemons, limes, blueberries, raspberries, strawberries, grapes, cantaloupe, honeydew melon, kiwi, papaya, passion fruit, and pineapple. Limited amounts of dried cranberries, banana chips, and shredded coconut. Vegetables Eggplant, zucchini, cucumber, peppers, green beans, bean sprouts, lettuce, arugula, kale, Swiss chard, spinach, collard greens, bok choy, summer squash, potato, and tomato. Limited amounts of corn, carrot, and sweet potato. Green parts of scallions. Grains Gluten-free grains, such as rice, oats, buckwheat, quinoa, corn, polenta, and millet. Gluten-free pasta, bread, or cereal. Rice noodles. Corn tortillas. Meats and other proteins Unseasoned beef, pork, poultry, or fish. Eggs. Berniece Salines. Tofu (firm) and tempeh. Limited amounts of nuts and seeds, such as almonds, walnuts, [place] nuts, pecans, peanuts, nut butters, pumpkin seeds, chia seeds, and sunflower seeds. Dairy Lactose-free milk, yogurt, and kefir. Lactose-free cottage cheese and ice cream. Non-dairy milks, such as almond, coconut, hemp, and rice  milk. Non-dairy yogurt. Limited amounts of goat cheese, brie, mozzarella, parmesan, swiss, and other hard cheeses. Fats and oils Butter-free spreads. Vegetable oils, such as olive, canola, and sunflower oil. Seasoning and other foods Artificial sweeteners with names that do not end in "ol," such as aspartame, saccharine, and stevia. Maple syrup, white table sugar, raw sugar, brown sugar, and molasses. Mayonnaise, soy sauce, and tamari. Fresh basil, coriander, parsley, rosemary, and thyme. Beverages Water and mineral water. Sugar-sweetened soft drinks. Small amounts of orange juice or cranberry juice. Black and green tea. Most dry wines. Coffee. The items listed above may not be a complete list of foods and beverages you can eat. Contact a dietitian for more information. Foods to avoid Fruits Fresh, dried, and juiced forms of apple, pear, watermelon, peach, plum, cherries, apricots, blackberries, boysenberries, figs, nectarines, and mango. Avocado. Vegetables Chicory root, artichoke, asparagus, cabbage, snow peas, Brussels sprouts, broccoli, sugar snap peas, mushrooms, celery, and cauliflower. Onions, garlic, leeks, and the white part of scallions. Grains Wheat, including kamut, durum, and semolina. Barley and bulgur. Couscous. Wheat-based cereals. Wheat noodles, bread, crackers, and pastries. Meats and other proteins Fried or fatty meat. Sausage. Cashews and pistachios. Soybeans, baked beans, black beans, chickpeas, kidney beans, fava beans, navy beans, lentils, black-eyed peas, and split peas. Dairy Milk, yogurt, ice cream, and soft cheese. Cream and sour cream. Milk-based sauces. Custard. Buttermilk. Soy milk. Seasoning and other foods Any sugar-free gum or candy. Foods that contain artificial sweeteners such as sorbitol, mannitol,  isomalt, or xylitol. Foods that contain honey, high-fructose corn syrup, or agave. Bouillon, vegetable stock, beef stock, and chicken stock. Garlic and onion powder.  Condiments made with onion, such as hummus, chutney, pickles, relish, salad dressing, and salsa. Tomato paste. Beverages Chicory-based drinks. Coffee substitutes. Chamomile tea. Fennel tea. Sweet or fortified wines such as port or sherry. Diet soft drinks made with isomalt, mannitol, maltitol, sorbitol, or xylitol. Apple, pear, and mango juice. Juices with high-fructose corn syrup. The items listed above may not be a complete list of foods and beverages you should avoid. Contact a dietitian for more information. Summary FODMAP stands for fermentable oligosaccharides, disaccharides, monosaccharides, and polyols. These are sugars that are hard for some people to digest. A low-FODMAP eating plan is a short-term diet that helps to ease symptoms of certain bowel diseases. The eating plan usually lasts up to 6 weeks. After that, high-FODMAP foods are reintroduced gradually and one at a time. This can help you find out which foods may be causing symptoms. A low-FODMAP eating plan can be complicated. It is best to work with a dietitian who has experience with this type of plan. This information is not intended to replace advice given to you by your health care provider. Make sure you discuss any questions you have with your health care provider. Document Revised: 01/23/2020 Document Reviewed: 01/23/2020 Elsevier Patient Education  [place].

## 2022-02-16 ENCOUNTER — Encounter: Payer: Self-pay | Admitting: Internal Medicine

## 2022-02-16 DIAGNOSIS — K439 Ventral hernia without obstruction or gangrene: Secondary | ICD-10-CM | POA: Insufficient documentation

## 2022-02-16 NOTE — Addendum Note (Signed)
Addended by: Orland Mustard on: 02/16/2022 05:28 PM   Modules accepted: Orders

## 2022-02-20 DIAGNOSIS — S99912A Unspecified injury of left ankle, initial encounter: Secondary | ICD-10-CM | POA: Diagnosis not present

## 2022-02-21 ENCOUNTER — Ambulatory Visit: Payer: BC Managed Care – PPO | Admitting: Family Medicine

## 2022-02-21 ENCOUNTER — Ambulatory Visit (INDEPENDENT_AMBULATORY_CARE_PROVIDER_SITE_OTHER): Payer: BC Managed Care – PPO

## 2022-02-21 ENCOUNTER — Encounter: Payer: Self-pay | Admitting: Family Medicine

## 2022-02-21 VITALS — BP 100/72 | HR 78 | Ht 60.0 in | Wt 127.6 lb

## 2022-02-21 DIAGNOSIS — M25572 Pain in left ankle and joints of left foot: Secondary | ICD-10-CM

## 2022-02-21 DIAGNOSIS — G43719 Chronic migraine without aura, intractable, without status migrainosus: Secondary | ICD-10-CM | POA: Diagnosis not present

## 2022-02-21 DIAGNOSIS — M7672 Peroneal tendinitis, left leg: Secondary | ICD-10-CM | POA: Diagnosis not present

## 2022-02-21 DIAGNOSIS — G43119 Migraine with aura, intractable, without status migrainosus: Secondary | ICD-10-CM | POA: Diagnosis not present

## 2022-02-21 DIAGNOSIS — G43839 Menstrual migraine, intractable, without status migrainosus: Secondary | ICD-10-CM | POA: Diagnosis not present

## 2022-02-21 DIAGNOSIS — M542 Cervicalgia: Secondary | ICD-10-CM | POA: Diagnosis not present

## 2022-02-21 IMAGING — DX DG ANKLE COMPLETE 3+V*L*
3 series · 3 of 3 positions shown · non-contrast
Comparison: [DATE]

CLINICAL DATA: Ankle pain 2 days after fall

EXAM:
LEFT ANKLE COMPLETE - 3+ VIEW

[ankle ap]
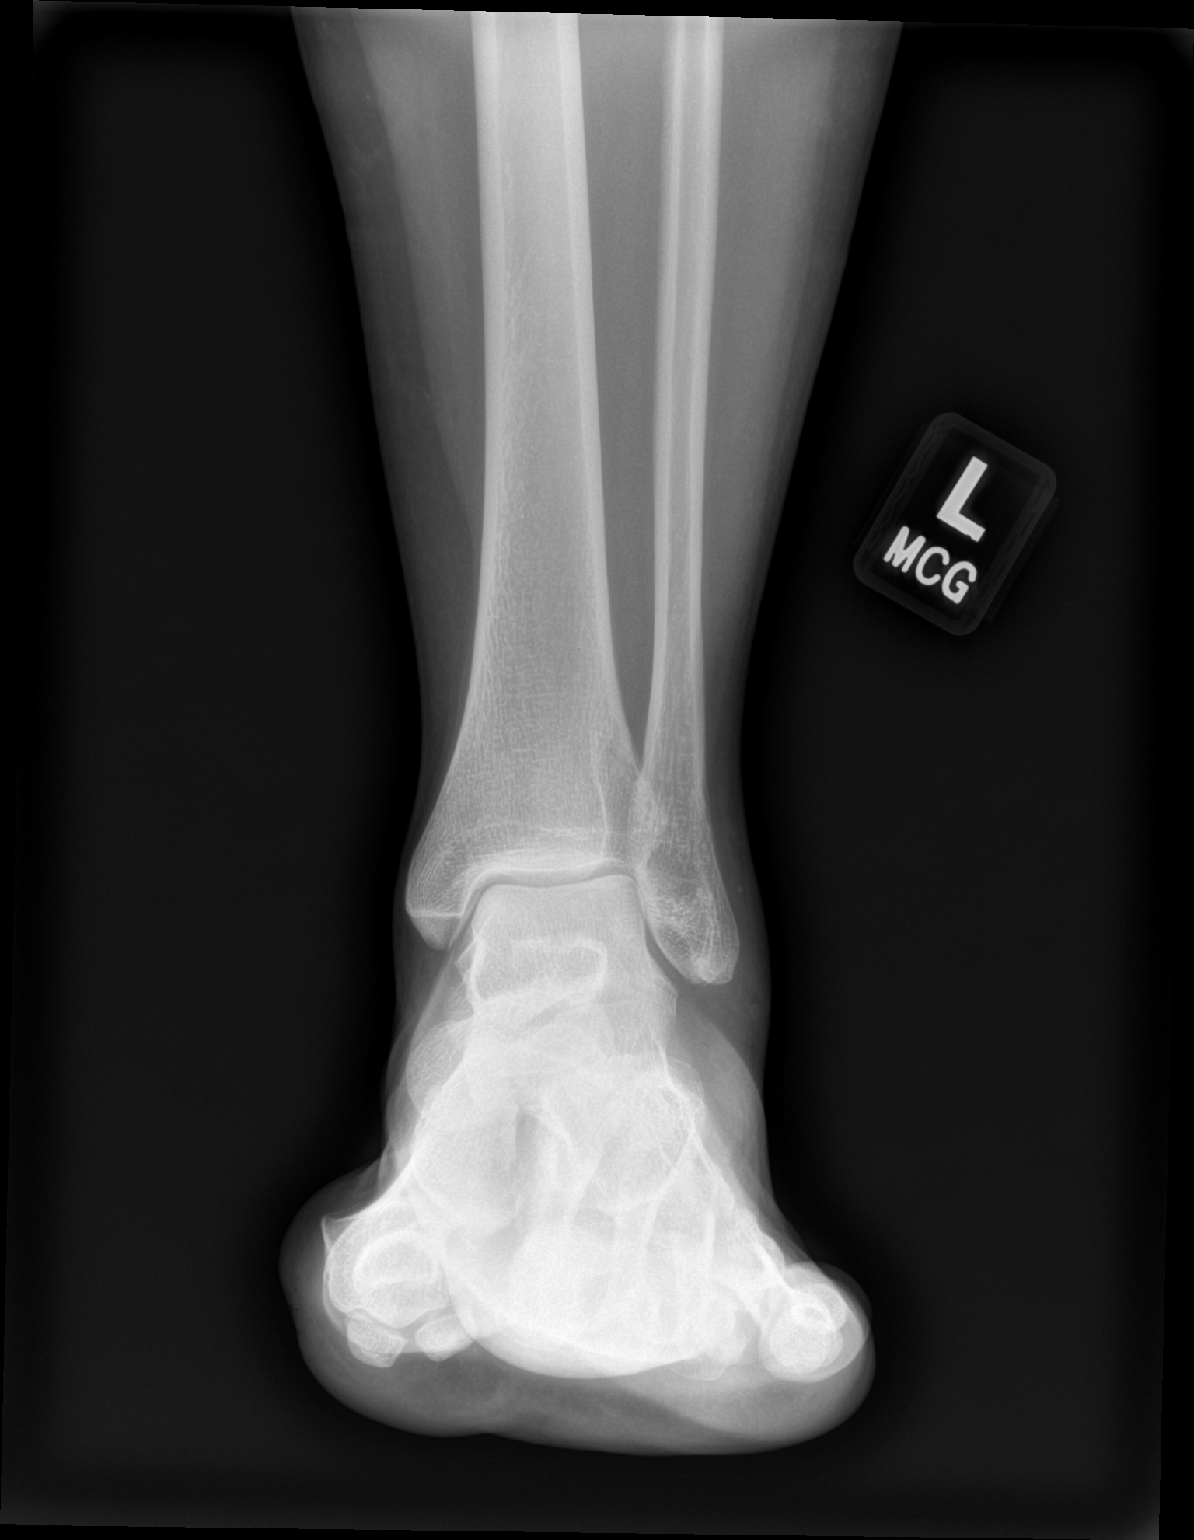

[ankle obl]
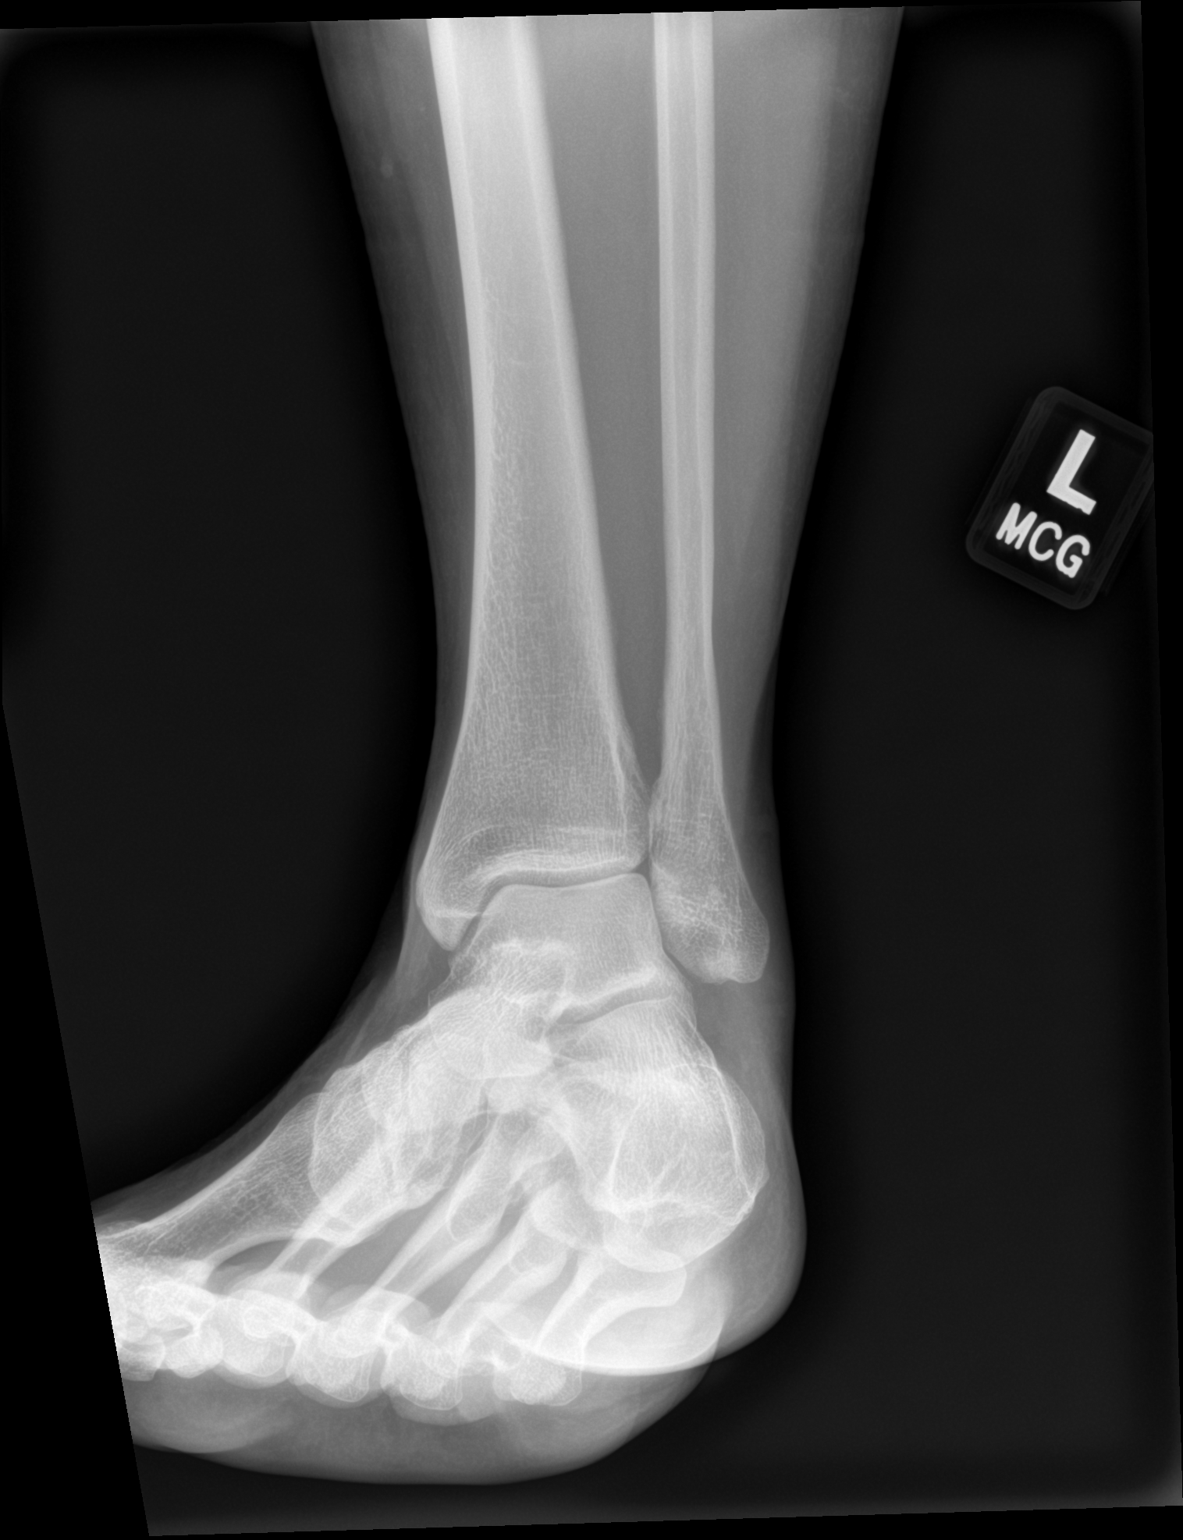

[ankle lat]
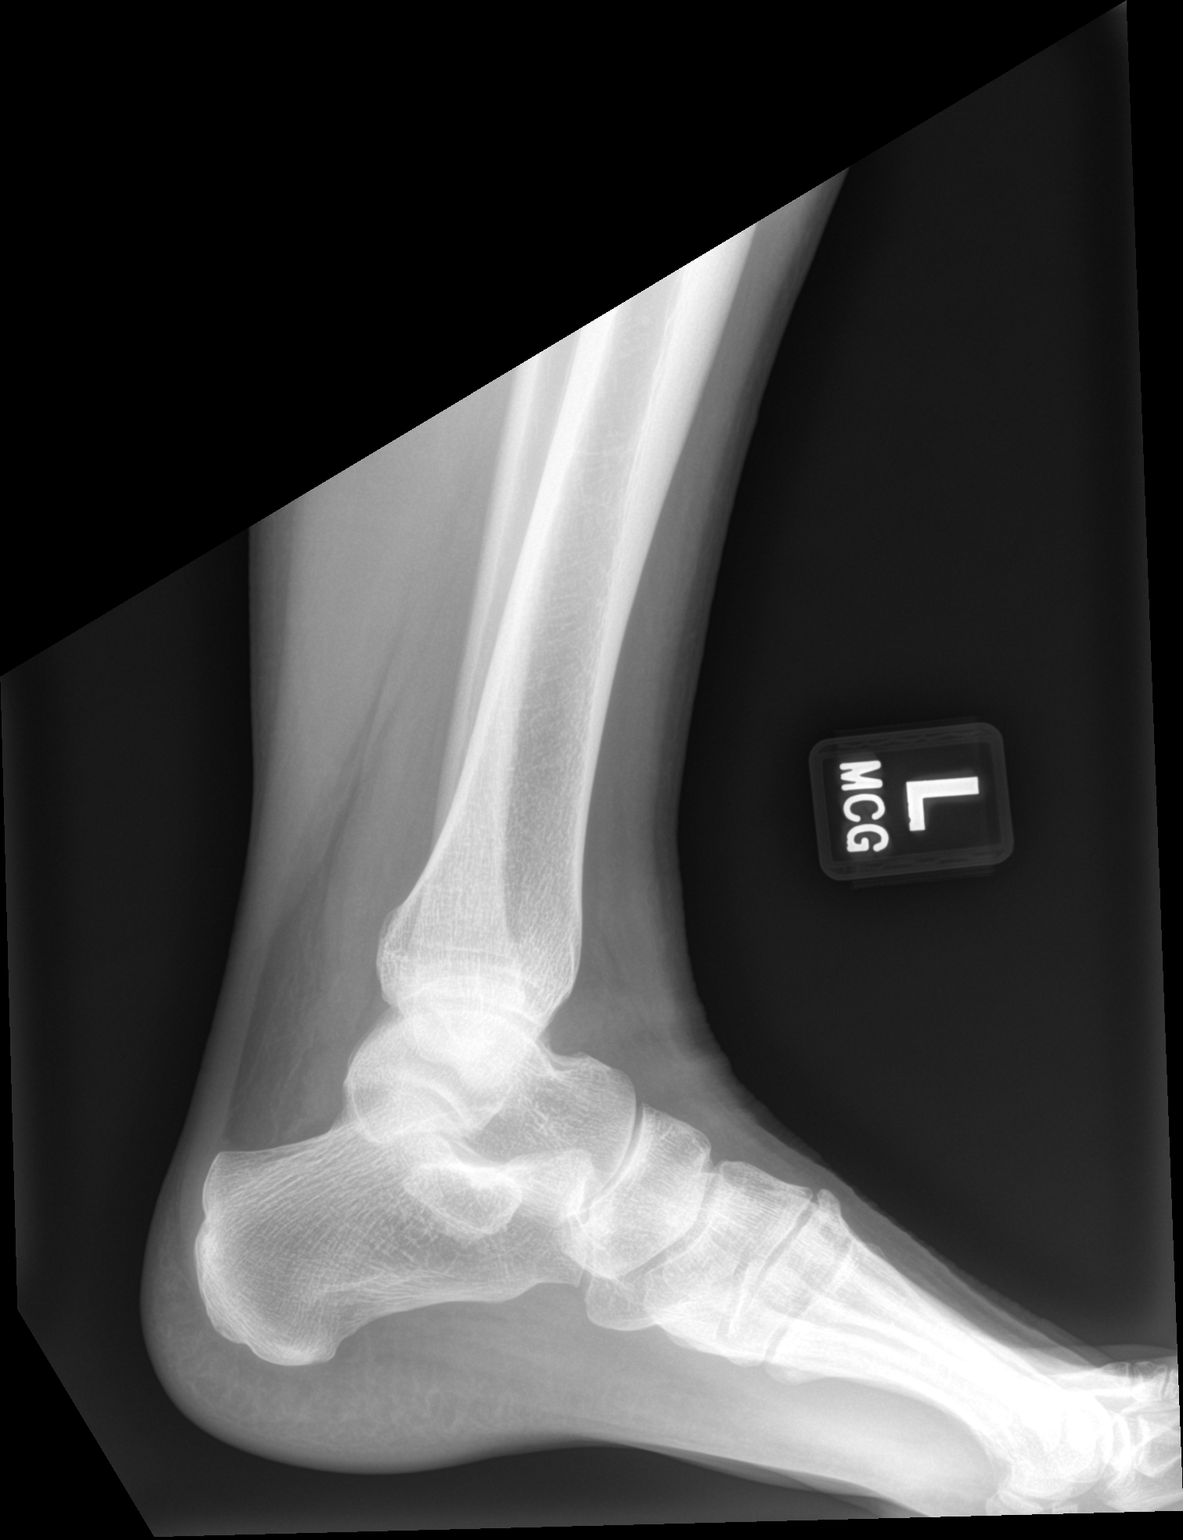

[3 of 3 positions shown; findings below may reference images not displayed]

FINDINGS: There is no evidence of fracture, dislocation, or joint effusion.
There is no evidence of arthropathy or other focal bone abnormality.
Soft tissues are unremarkable.
IMPRESSION: Negative.

## 2022-02-21 NOTE — Progress Notes (Signed)
   I, Wendy Poet, LAT, ATC, am serving as scribe for Dr. Lynne Leader.  Sarah Moran is a 41 y.o. female who presents to [place] at [facility] today for f/u of L lateral ankle and foot pain thought to be due to peroneal tenosynovitis.  She was last seen by Dr. Georgina Snell on 01/27/22 and was shown peroneal eccentrics and advised to use Voltaren gel and an ankle compression sleeve.  Today, pt reports that she fell and injured her L ankle again this weekend on Sat, February 19, 2022 when she stepped down from an elevated surface and rolled her L ankle into inversion.  She locates her pain to her entire L ankle .  She has swelling at her l lateral ankle.  Aggravating factors include pressure to the area and walking/weight-bearing activity.  She has been icing, wearing her compression sleeve and taking IBU '800mg'$  and Baclofen.  She went to Urgent Care on Sunday but did not get an XR.  Diagnostic testing: L ankle XR- 01/27/22  Pertinent review of systems: No fevers or chills  Relevant historical information: History of chronic ankle pain previously seen thought to be due to peroneal tenosynovitis.   Exam:  BP 100/72 (BP Location: Right Arm, Patient Position: Sitting, Cuff Size: Normal)   Pulse 78   Ht 5' (1.524 m)   Wt 127 lb 9.6 oz (57.9 kg)   SpO2 98%   BMI 24.92 kg/m  General: Well Developed, well nourished, and in no acute distress.   MSK: Left ankle moderate effusion lateral ankle.  Tender palpation at ATFL region. Normal ankle motion with pain. No severe laxity with stability testing however guarding present with testing. Strength is intact. Pulses and capillary refill are intact distally.  Sensation is intact distally.    Lab and Radiology Results  X-ray images left ankle obtained today personally and independently interpreted. No acute fractures are visible. Await for radiology review    Assessment and Plan: 41 y.o. female with left ankle pain thought to be due  to an ankle sprain likely grade 2.  Plan to continue cam walker boot and resume home exercise program previously taught including range of motion exercises.  When able wean into an ASO ankle brace.  Recheck in 2 weeks as already scheduled. This is acute exacerbation of a chronic issue for her.  PDMP not reviewed this encounter. Orders Placed This Encounter  Procedures   DG Ankle Complete Left    Standing Status:   Future    Number of Occurrences:   1    Standing Expiration Date:   03/23/2022    Order Specific Question:   Reason for Exam (SYMPTOM  OR DIAGNOSIS REQUIRED)    Answer:   L ankle pain    Order Specific Question:   Is patient pregnant?    Answer:   No    Order Specific Question:   Preferred imaging location?    Answer:   [facility]   No orders of the defined types were placed in this encounter.    Discussed warning signs or symptoms. Please see discharge instructions. Patient expresses understanding.   The above documentation has been reviewed and is accurate and complete Lynne Leader, M.D.

## 2022-02-21 NOTE — Patient Instructions (Addendum)
Good to see you today.  Keep wearing your boot.  Use crutches and/or scooter as needed if you're having pain w/ walking.  Transition to a lace-up ASO ankle brace when ready.  You may purchase one of these on [place] or at a medical supply store.  Follow-up: in about 2 weeks as scheduled.

## 2022-02-22 NOTE — Progress Notes (Signed)
Left ankle x-ray looks normal to radiology.

## 2022-03-04 NOTE — Progress Notes (Unsigned)
   I, Wendy Poet, LAT, ATC, am serving as scribe for Dr. Lynne Leader.  Sarah Moran is a 41 y.o. female who presents to [place] at [facility] today for f/u of L lateral ankle and foot pain due to a combination of a L lateral ankle sprain and peroneal tenosynovitis.  She was last seen by Dr. Georgina Snell on 02/21/22 after rolling her ankle into inversion on 02/19/22 and was advised to wear a CAM walker boot and con't her HEP from previously.  She was also advised to wean into an ASO ankle brace as tolerated.  Today, pt reports L ankle swelling as improved, but ankle is still TTP over the lateral aspect. Pt will wear the ASO ankle brace when she is out doing activity.   Diagnostic testing: L ankle XR- 02/21/22, 01/27/22  Pertinent review of systems: no fever or chills  Relevant historical information: History of migraines.   Exam:  BP 96/64   Pulse 80   Ht 5' (1.524 m)   Wt 127 lb 12.8 oz (58 kg)   SpO2 99%   BMI 24.96 kg/m  General: Well Developed, well nourished, and in no acute distress.   MSK: Left ankle slight swelling at ATFL region. Tender palpation at lateral ankle distal fibula and the peroneal tendons. Normal ankle motion. Pain with resisted ankle eversion and plantarflexion felt laterally. Stable ligamentous exam. Intact strength.    Lab and Radiology Results  Diagnostic Limited MSK Ultrasound of: Left ankle Peroneal tendons normal-appearing at area of maximal tenderness distal fibula. More distally at the lateral malleolus area.  Tendons have some hypoechoic change in tendon sheath consistent with peroneal tenosynovitis. Bony cortex of fibula is normal-appearing Impression: Peroneal tenosynovitis left   EXAM: LEFT ANKLE COMPLETE - 3+ VIEW   COMPARISON:  01/27/2022   FINDINGS: There is no evidence of fracture, dislocation, or joint effusion. There is no evidence of arthropathy or other focal bone abnormality. Soft tissues are unremarkable.    IMPRESSION: Negative.     Electronically Signed   By: Donavan Foil M.D.   On: 02/21/2022 23:33 I, Lynne Leader, personally (independently) visualized and performed the interpretation of the images attached in this note.    Assessment and Plan: 41 y.o. female with ankle sprain with left peroneal tenosynovitis.  Plan for eccentric exercises tight in clinic today by ATC.  Continue ASO ankle brace.  Recheck in 1 month.   PDMP not reviewed this encounter. Orders Placed This Encounter  Procedures   [place] LIMITED JOINT SPACE STRUCTURES LOW LEFT(NO LINKED CHARGES)    Order Specific Question:   Reason for Exam (SYMPTOM  OR DIAGNOSIS REQUIRED)    Answer:   left ankle pain    Order Specific Question:   Preferred imaging location?    Answer:   [facility]   No orders of the defined types were placed in this encounter.    Discussed warning signs or symptoms. Please see discharge instructions. Patient expresses understanding.   The above documentation has been reviewed and is accurate and complete Lynne Leader, M.D.

## 2022-03-07 ENCOUNTER — Ambulatory Visit (INDEPENDENT_AMBULATORY_CARE_PROVIDER_SITE_OTHER): Payer: BC Managed Care – PPO

## 2022-03-07 ENCOUNTER — Ambulatory Visit: Payer: BC Managed Care – PPO | Admitting: Family Medicine

## 2022-03-07 ENCOUNTER — Ambulatory Visit: Payer: Self-pay

## 2022-03-07 ENCOUNTER — Other Ambulatory Visit: Payer: BC Managed Care – PPO

## 2022-03-07 VITALS — BP 96/64 | HR 80 | Ht 60.0 in | Wt 127.8 lb

## 2022-03-07 DIAGNOSIS — M7672 Peroneal tendinitis, left leg: Secondary | ICD-10-CM

## 2022-03-07 DIAGNOSIS — E039 Hypothyroidism, unspecified: Secondary | ICD-10-CM | POA: Diagnosis not present

## 2022-03-07 DIAGNOSIS — M25572 Pain in left ankle and joints of left foot: Secondary | ICD-10-CM | POA: Diagnosis not present

## 2022-03-07 DIAGNOSIS — G8929 Other chronic pain: Secondary | ICD-10-CM

## 2022-03-07 DIAGNOSIS — G43719 Chronic migraine without aura, intractable, without status migrainosus: Secondary | ICD-10-CM | POA: Diagnosis not present

## 2022-03-07 DIAGNOSIS — T148XXA Other injury of unspecified body region, initial encounter: Secondary | ICD-10-CM

## 2022-03-07 DIAGNOSIS — Z23 Encounter for immunization: Secondary | ICD-10-CM | POA: Diagnosis not present

## 2022-03-07 DIAGNOSIS — M542 Cervicalgia: Secondary | ICD-10-CM | POA: Diagnosis not present

## 2022-03-07 NOTE — Patient Instructions (Addendum)
Thank you for coming in today.   Please complete the exercises that the athletic trainer went over with you:  View at [email or web address]-[email or web address] using code: [id number]  Check back in 1 month

## 2022-03-07 NOTE — Progress Notes (Signed)
Patient came in today for 2nd Hep B vaccine given in left deltoid IM. Patient tolerated well with no signs of distress.

## 2022-03-08 ENCOUNTER — Encounter: Payer: Self-pay | Admitting: Family Medicine

## 2022-03-08 LAB — CBC WITH DIFFERENTIAL/PLATELET
Basophils Absolute: 0 10*3/uL (ref 0.0–0.1)
Basophils Relative: 0.4 % (ref 0.0–3.0)
Eosinophils Absolute: 0 10*3/uL (ref 0.0–0.7)
Eosinophils Relative: 0 % (ref 0.0–5.0)
HCT: 39.3 % (ref 36.0–46.0)
Hemoglobin: 13.3 g/dL (ref 12.0–15.0)
Lymphocytes Relative: 4.8 % — ABNORMAL LOW (ref 12.0–46.0)
Lymphs Abs: 0.5 10*3/uL — ABNORMAL LOW (ref 0.7–4.0)
MCHC: 34 g/dL (ref 30.0–36.0)
MCV: 89 fl (ref 78.0–100.0)
Monocytes Absolute: 0.1 10*3/uL (ref 0.1–1.0)
Monocytes Relative: 1.1 % — ABNORMAL LOW (ref 3.0–12.0)
Neutro Abs: 9.8 10*3/uL — ABNORMAL HIGH (ref 1.4–7.7)
Neutrophils Relative %: 93.7 % — ABNORMAL HIGH (ref 43.0–77.0)
Platelets: 322 10*3/uL (ref 150.0–400.0)
RBC: 4.41 Mil/uL (ref 3.87–5.11)
RDW: 13.3 % (ref 11.5–15.5)
WBC: 10.4 10*3/uL (ref 4.0–10.5)

## 2022-03-08 LAB — PROTIME-INR
INR: 0.9
Prothrombin Time: 9.5 s (ref 9.0–11.5)

## 2022-03-08 LAB — TSH: TSH: 0.25 u[IU]/mL — ABNORMAL LOW (ref 0.35–5.50)

## 2022-03-16 NOTE — Telephone Encounter (Signed)
I think it would be best for her to come in for a visit to discuss these questions and concerns further. It is difficult to adequately address so many questions through mychart.

## 2022-03-23 ENCOUNTER — Ambulatory Visit: Payer: BC Managed Care – PPO | Admitting: Family Medicine

## 2022-03-23 ENCOUNTER — Encounter: Payer: Self-pay | Admitting: Family Medicine

## 2022-03-23 VITALS — BP 110/70 | HR 81 | Temp 98.6°F | Ht 60.0 in | Wt 128.4 lb

## 2022-03-23 DIAGNOSIS — D729 Disorder of white blood cells, unspecified: Secondary | ICD-10-CM | POA: Diagnosis not present

## 2022-03-23 DIAGNOSIS — M7672 Peroneal tendinitis, left leg: Secondary | ICD-10-CM

## 2022-03-23 DIAGNOSIS — E039 Hypothyroidism, unspecified: Secondary | ICD-10-CM

## 2022-03-23 DIAGNOSIS — G43909 Migraine, unspecified, not intractable, without status migrainosus: Secondary | ICD-10-CM

## 2022-03-23 DIAGNOSIS — R21 Rash and other nonspecific skin eruption: Secondary | ICD-10-CM

## 2022-03-23 NOTE — Patient Instructions (Signed)
Nice to see you. We will contact you with your lab result. Please avoid eating popcorn in the future.

## 2022-03-24 LAB — CBC WITH DIFFERENTIAL/PLATELET
Basophils Absolute: 0.1 10*3/uL (ref 0.0–0.1)
Basophils Relative: 0.9 % (ref 0.0–3.0)
Eosinophils Absolute: 0.1 10*3/uL (ref 0.0–0.7)
Eosinophils Relative: 1.4 % (ref 0.0–5.0)
HCT: 37.5 % (ref 36.0–46.0)
Hemoglobin: 12.6 g/dL (ref 12.0–15.0)
Lymphocytes Relative: 22.4 % (ref 12.0–46.0)
Lymphs Abs: 1.8 10*3/uL (ref 0.7–4.0)
MCHC: 33.6 g/dL (ref 30.0–36.0)
MCV: 88.9 fl (ref 78.0–100.0)
Monocytes Absolute: 0.5 10*3/uL (ref 0.1–1.0)
Monocytes Relative: 6.6 % (ref 3.0–12.0)
Neutro Abs: 5.6 10*3/uL (ref 1.4–7.7)
Neutrophils Relative %: 68.7 % (ref 43.0–77.0)
Platelets: 316 10*3/uL (ref 150.0–400.0)
RBC: 4.22 Mil/uL (ref 3.87–5.11)
RDW: 13.1 % (ref 11.5–15.5)
WBC: 8.1 10*3/uL (ref 4.0–10.5)

## 2022-03-25 DIAGNOSIS — R21 Rash and other nonspecific skin eruption: Secondary | ICD-10-CM | POA: Insufficient documentation

## 2022-03-25 DIAGNOSIS — D729 Disorder of white blood cells, unspecified: Secondary | ICD-10-CM | POA: Insufficient documentation

## 2022-03-25 NOTE — Assessment & Plan Note (Signed)
She will remain off of medication for now.  She will follow-up with neurology to see what other treatment options there might be.

## 2022-03-25 NOTE — Assessment & Plan Note (Signed)
She will keep her scheduled appointment with endocrinology.  She will remain on Synthroid 75 mcg once daily.

## 2022-03-25 NOTE — Assessment & Plan Note (Signed)
Possibly related to the steroid injection she was getting or some underlying infection she previously had.  We will recheck today.

## 2022-03-25 NOTE — Assessment & Plan Note (Signed)
Patient has ongoing issues with this.  She will continue her ASO brace.  She will continue to follow with sports medicine.

## 2022-03-25 NOTE — Progress Notes (Signed)
Sarah Rumps, MD Phone: [phone]  Sarah Moran is a 41 y.o. female who presents today for f/u.  Neutrophilia: Patient's had lab work that has indicated elevated neutrophils.  She notes no recent illness.  She was getting injections with steroids in them.  Migraines: Patient notes she stopped her new migraine medications.  She had a lot of crying and lack of caring while she was on it.  She notes her thyroid function has been off while taking it as well.  Left ankle injury: She notes she reinjured this by inverting it sometime ago.  It still swells intermittently.  She has been following with a specialist.  She was in a boot for 1 week.  She is currently in an ASO brace.  Hypothyroidism: She notes she sees a new endocrinologist at the end of July.  Mouth rash: Patient notes this occurs after eating popcorn.  Notes the roof of her mouth will feel tender and raw and appear to be raw.  She notes no throat swelling or tongue swelling.  She notes it does not occur every time she eats popcorn though only occurs when she has popcorn.  She notes this has been going on for 6 to 8 months.  Social History   Tobacco Use  Smoking Status Never  Smokeless Tobacco Never    Current Outpatient Medications on File Prior to Visit  Medication Sig Dispense Refill   Cholecalciferol (VITAMIN D3) 50 MCG (2000 UT) capsule Take 2,000 Units by mouth daily.     Cod Liver Oil CAPS Take 1 capsule by mouth at bedtime.      diclofenac Sodium (VOLTAREN) 1 % GEL Apply 2 g topically 4 (four) times daily.     Fluticasone Propionate (XHANCE) 93 MCG/ACT EXHU Place 1-2 puffs into the nose daily as needed. 16 mL 5   levocetirizine (XYZAL) 5 MG tablet Take 1 tablet (5 mg total) by mouth every evening. 30 tablet 5   levothyroxine (SYNTHROID) 75 MCG tablet Take 1 tablet (75 mcg total) by mouth daily. 90 tablet 1   SUMAtriptan (IMITREX) 100 MG tablet TAKE HALF A TAB (50 MG) TO ONE TAB (100 MG) AT HEADACHE ONSET. REPEAT  ONCE IN 2 HOURS IF NEEDED. TAKE NO MORE THAN 2 TABS IN A 24 HOURS PERIOD.     baclofen (LIORESAL) 10 MG tablet SMARTSIG:1 Tablet(s) By Mouth Twice a Week PRN (Patient not taking: Reported on 03/23/2022)     zonisamide (ZONEGRAN) 25 MG capsule Take 100 mg by mouth at bedtime. (Patient not taking: Reported on 03/23/2022)     No current facility-administered medications on file prior to visit.     ROS see history of present illness  Objective  Physical Exam Vitals:   03/23/22 1632  BP: 110/70  Pulse: 81  Temp: 98.6 F (37 C)  SpO2: 99%    BP Readings from Last 3 Encounters:  03/23/22 110/70  03/07/22 96/64  02/21/22 100/72   Wt Readings from Last 3 Encounters:  03/23/22 128 lb 6.4 oz (58.2 kg)  03/07/22 127 lb 12.8 oz (58 kg)  02/21/22 127 lb 9.6 oz (57.9 kg)    Physical Exam Constitutional:      General: She is not in acute distress.    Appearance: She is not diaphoretic.  HENT:     Mouth/Throat:     Comments: Raw appearing mucosa over the roof of her mouth, no apparent white exudate Cardiovascular:     Rate and Rhythm: Normal rate and regular rhythm.  Heart sounds: Normal heart sounds.  Pulmonary:     Effort: Pulmonary effort is normal.     Breath sounds: Normal breath sounds.  Skin:    General: Skin is warm and dry.  Neurological:     Mental Status: She is alert.      Assessment/Plan: Please see individual problem list.  Problem List Items Addressed This Visit     Hypothyroidism (Chronic)    She will keep her scheduled appointment with endocrinology.  She will remain on Synthroid 75 mcg once daily.      Migraine (Chronic)    She will remain off of medication for now.  She will follow-up with neurology to see what other treatment options there might be.      Peroneal tendinitis, left (Chronic)    Patient has ongoing issues with this.  She will continue her ASO brace.  She will continue to follow with sports medicine.      Neutrophilia - Primary     Possibly related to the steroid injection she was getting or some underlying infection she previously had.  We will recheck today.      Relevant Orders   CBC w/Diff (Completed)   Rash of mouth present on examination    Patient likely has an allergy to some component of the popcorn she has been eating.  I advised at this point she needs to refrain from eating any popcorn given the risk that any allergy could progress to something more severe.  She does not have any anaphylactic symptoms with this.        Return for As scheduled.   Sarah Rumps, MD [place]

## 2022-03-25 NOTE — Assessment & Plan Note (Signed)
Patient likely has an allergy to some component of the popcorn she has been eating.  I advised at this point she needs to refrain from eating any popcorn given the risk that any allergy could progress to something more severe.  She does not have any anaphylactic symptoms with this.

## 2022-03-28 ENCOUNTER — Ambulatory Visit: Payer: BC Managed Care – PPO | Admitting: Endocrinology

## 2022-04-01 DIAGNOSIS — G43119 Migraine with aura, intractable, without status migrainosus: Secondary | ICD-10-CM | POA: Diagnosis not present

## 2022-04-01 DIAGNOSIS — M542 Cervicalgia: Secondary | ICD-10-CM | POA: Diagnosis not present

## 2022-04-01 DIAGNOSIS — G43839 Menstrual migraine, intractable, without status migrainosus: Secondary | ICD-10-CM | POA: Diagnosis not present

## 2022-04-01 DIAGNOSIS — G43719 Chronic migraine without aura, intractable, without status migrainosus: Secondary | ICD-10-CM | POA: Diagnosis not present

## 2022-04-04 ENCOUNTER — Ambulatory Visit: Payer: Self-pay

## 2022-04-04 ENCOUNTER — Ambulatory Visit: Payer: BC Managed Care – PPO | Admitting: Family Medicine

## 2022-04-04 ENCOUNTER — Encounter: Payer: Self-pay | Admitting: Family Medicine

## 2022-04-04 VITALS — BP 100/60 | HR 70 | Ht 60.0 in | Wt 128.8 lb

## 2022-04-04 DIAGNOSIS — M25572 Pain in left ankle and joints of left foot: Secondary | ICD-10-CM | POA: Diagnosis not present

## 2022-04-04 DIAGNOSIS — D225 Melanocytic nevi of trunk: Secondary | ICD-10-CM | POA: Diagnosis not present

## 2022-04-04 DIAGNOSIS — D2272 Melanocytic nevi of left lower limb, including hip: Secondary | ICD-10-CM | POA: Diagnosis not present

## 2022-04-04 DIAGNOSIS — D2262 Melanocytic nevi of left upper limb, including shoulder: Secondary | ICD-10-CM | POA: Diagnosis not present

## 2022-04-04 DIAGNOSIS — G8929 Other chronic pain: Secondary | ICD-10-CM

## 2022-04-04 DIAGNOSIS — D2261 Melanocytic nevi of right upper limb, including shoulder: Secondary | ICD-10-CM | POA: Diagnosis not present

## 2022-04-04 NOTE — Progress Notes (Signed)
   I, Wendy Poet, LAT, ATC, am serving as scribe for Dr. Lynne Leader.  Demaria D Mangas is a 41 y.o. female who presents to [place] at [facility] today for f/u of L lateral ankle due to lateral ankle sprain and peroneal tenosynovitis. Pt was last seen by Dr. Georgina Snell on 03/07/22 and was advised to use ASO ankle brace and was taught HEP. Today, pt reports that her L ankle is doing better.  She has been doing her HEP and wearing her ASO as needed.  She con't to get some popping in her L anterior and lateral ankle and con't to have soreness in her L ankle.  She also reports that she re-tweaked her ankle when she was reaching up into a cabinet and heard/felt a pop in her ankle.  Dx imaging: 02/21/22 L ankle XR  01/27/22 L ankle XR  Pertinent review of systems: No fevers or chills  Relevant historical information: Hypothyroidism.   Exam:  BP 100/60 (BP Location: Right Arm, Patient Position: Sitting, Cuff Size: Normal)   Pulse 70   Ht 5' (1.524 m)   Wt 128 lb 12.8 oz (58.4 kg)   SpO2 97%   BMI 25.15 kg/m  General: Well Developed, well nourished, and in no acute distress.   MSK: Left ankle: Normal appearing Tender palpation left ankle at ATFL region and along the course of the peroneal tendon. Normal ankle motion. Stable ligamentous exam. Intact strength.    Lab and Radiology Results  Diagnostic Limited MSK Ultrasound of: Left ankle Peroneal tendons enlarged at just at posterior to the malleolus and just distal to the malleolus with probable split tear of the peroneal longus tendon.  Peroneal brevis tendon is enlarged appearing Normal-appearing ATFL region. Impression: Probable peroneal tendinopathy with probable split tear.      Assessment and Plan: 41 y.o. female with left ankle pain after sprain.  Improving but not fully resolved with home exercise program.  Plan to continue home exercise program and ankle compression sleeve.  If not improved in 1 month proceed  to MRI very likely.  Recheck in 1 month.   PDMP not reviewed this encounter. Orders Placed This Encounter  Procedures   [place] LIMITED JOINT SPACE STRUCTURES LOW LEFT(NO LINKED CHARGES)    Order Specific Question:   Reason for Exam (SYMPTOM  OR DIAGNOSIS REQUIRED)    Answer:   L ankle pain    Order Specific Question:   Preferred imaging location?    Answer:   [place]   No orders of the defined types were placed in this encounter.    Discussed warning signs or symptoms. Please see discharge instructions. Patient expresses understanding.   The above documentation has been reviewed and is accurate and complete Lynne Leader, M.D.

## 2022-04-04 NOTE — Patient Instructions (Addendum)
Good to see you today.  Con't w/ your home exercises.  Follow-up: one month and if not better will do an MRI

## 2022-04-11 ENCOUNTER — Encounter: Payer: Self-pay | Admitting: Gastroenterology

## 2022-04-11 ENCOUNTER — Ambulatory Visit: Payer: BC Managed Care – PPO | Admitting: Gastroenterology

## 2022-04-11 VITALS — BP 102/68 | HR 78 | Ht 60.0 in | Wt 127.0 lb

## 2022-04-11 DIAGNOSIS — R109 Unspecified abdominal pain: Secondary | ICD-10-CM | POA: Diagnosis not present

## 2022-04-11 DIAGNOSIS — K589 Irritable bowel syndrome without diarrhea: Secondary | ICD-10-CM | POA: Diagnosis not present

## 2022-04-11 DIAGNOSIS — K219 Gastro-esophageal reflux disease without esophagitis: Secondary | ICD-10-CM

## 2022-04-11 NOTE — Progress Notes (Signed)
Sarah Moran    [id number]    [birth year]  Primary Care Physician:Moran, Sarah Adam, MD  Referring Physician: McLean-Scocuzza, Sarah Glow, MD [place],  [place] [id number]   Chief complaint:  Abdominal pain, hiatal hernia  HPI: 41 year old very pleasant female here for new patient visit for intermittent abdominal pain, hiatal hernia, GERD and chronic idiopathic constipation  GERD symptoms have improved with dietary changes.  She is no longer in constipation her bowel habits are regular.  She is eating high-fiber diet and has increased water intake. She is experiencing intermittent abdominal cramping and discomfort associated with bloating she has intentionally lost weight and feels helped improve GERD symptoms  Denies any rectal bleeding  EGD September 2019+ for H. pylori status post triple therapy.  H pylori breath test February 2020 confirmed eradication   Outpatient Encounter Medications as of 04/11/2022  Medication Sig   baclofen (LIORESAL) 10 MG tablet    Cholecalciferol (VITAMIN D3) 50 MCG (2000 UT) capsule Take 2,000 Units by mouth daily.   Cod Liver Oil CAPS Take 1 capsule by mouth at bedtime.    diclofenac Sodium (VOLTAREN) 1 % GEL Apply 2 g topically 4 (four) times daily.   Fluticasone Propionate (XHANCE) 93 MCG/ACT EXHU Place 1-2 puffs into the nose daily as needed.   levocetirizine (XYZAL) 5 MG tablet Take 1 tablet (5 mg total) by mouth every evening.   levothyroxine (SYNTHROID) 75 MCG tablet Take 1 tablet (75 mcg total) by mouth daily.   SUMAtriptan (IMITREX) 100 MG tablet    topiramate (TOPAMAX) 25 MG tablet Take 25 mg by mouth 2 (two) times daily.   [DISCONTINUED] zonisamide (ZONEGRAN) 25 MG capsule Take 100 mg by mouth at bedtime. (Patient not taking: Reported on 03/23/2022)   No facility-administered encounter medications on file as of 04/11/2022.    Allergies as of 04/11/2022 - Review Complete 04/11/2022  Allergen Reaction Noted    Darvon [propoxyphene] Other (See Comments) 09/23/2013   Onion Other (See Comments) 07/27/2018   Percocet [oxycodone-acetaminophen] Hives 09/23/2013   Vicodin [hydrocodone-acetaminophen] Hives 09/23/2013    Past Medical History:  Diagnosis Date   Allergy    Anal fissure    Anxiety    no meds   Chronic pelvic pain in female    Family history of breast cancer    My Risk neg 5/18   Family history of cervical cancer    Family history of skin cancer    Family history of throat cancer    Frequent headaches    Gallstone    Gallstones    Genetic testing of female 01/2017   My Risk/BRCA neg   GERD (gastroesophageal reflux disease)    well controlled. Takes gingerroot when symptoms   Hypothyroidism    s/p thyroidectomy - levothyroxine 75 mcg daily   IBS (irritable bowel syndrome)    constipation - takes miralax daily   Increased risk of breast cancer 01/2017   IBIS=18.8%/riskscore=21.9%   Liver cyst 04/2018   Migraine    takes excedrin. She has tried imitrex in the past    Personal history of radiation therapy    Seizure ([place]) 1997    x 1-age 65-after surgery for scoliosis   Thyroid cancer ([place]) 2007   s/p thyroidectomy    Past Surgical History:  Procedure Laterality Date   ANAL RECTAL MANOMETRY N/A 10/12/2018   Procedure: ANO RECTAL MANOMETRY;  Surgeon: Sarah Pole, MD;  Location: [facility];  Service: Endoscopy;  Laterality: N/A;   ANTERIOR AND POSTERIOR REPAIR WITH SACROSPINOUS FIXATION N/A 08/02/2018   Procedure: ANTERIOR AND POSTERIOR REPAIR;  Surgeon: Sarah Fellers, MD;  Location: [facility] ORS;  Service: Gynecology;  Laterality: N/A;   BACK SURGERY  1997   scoliosis   CHOLECYSTECTOMY N/A 09/21/2018   Procedure: LAPAROSCOPIC CHOLECYSTECTOMY;  Surgeon: Sarah Maudlin, MD;  Location: [facility] ORS;  Service: General;  Laterality: N/A;   COLONOSCOPY     ESOPHAGOGASTRODUODENOSCOPY (EGD) WITH PROPOFOL N/A 06/12/2018   Procedure: ESOPHAGOGASTRODUODENOSCOPY (EGD) WITH  PROPOFOL;  Surgeon: Sarah Landsman, MD;  Location: [facility] END[facility];  Service: Gastroenterology;  Laterality: N/A;   HERNIA REPAIR  1990   bilateral inguinal   PUBOVAGINAL SLING N/A 08/02/2018   Procedure: PUBO-VAGINAL SLING- TVT;  Surgeon: Sarah Fellers, MD;  Location: [facility] ORS;  Service: Gynecology;  Laterality: N/A;   THYROIDECTOMY  2007   thyroid cancer   TOTAL ABDOMINAL HYSTERECTOMY  2008   dymenorrhea    Family History  Problem Relation Age of Onset   Hyperlipidemia Father    Hypertension Father    Clotting disorder Sister    Hepatitis B Brother    Breast cancer Paternal Grandmother 41       with mets   Cervical cancer Mother        dx. in her early 56s   Breast cancer Paternal Aunt 48   Throat cancer Paternal Aunt 76   Breast cancer Paternal Aunt 57   Breast cancer Paternal Aunt        46   Skin cancer Paternal Aunt    Cancer Cousin        unknown type dx. in her late 20s/early 64s   Skin cancer Cousin        60 cancerous moles removed in her 30s    Social History   Socioeconomic History   Marital status: Married    Spouse name: Sarah Moran   Number of children: 2   Years of education: 12   Highest education level: Not on file  Occupational History   Occupation: Stay at home mom  Tobacco Use   Smoking status: Never   Smokeless tobacco: Never  Vaping Use   Vaping Use: Never used  Substance and Sexual Activity   Alcohol use: Not Currently   Drug use: No   Sexual activity: Yes    Birth control/protection: Surgical    Comment: Hysterectomy  Other Topics Concern   Not on file  Social History Narrative   Sarah Moran grew up in [place]. She currently lives in [place] with her husband, Sarah Moran, and their 2 sons ([place]  and [place] ). 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 Sarah Moran belongs to the [employer]. She enjoys outdoor activities on her spare time.   Social Determinants of Health    Financial Resource Strain: Not on file  Food Insecurity: Not on file  Transportation Needs: Not on file  Physical Activity: Not on file  Stress: Not on file  Social Connections: Not on file  Intimate Partner Violence: Not on file      Review of systems: All other review of systems negative except as mentioned in the HPI.   Physical Exam: Vitals:   04/11/22 1431  BP: 102/68  Pulse: 78   Body mass index is 24.8 kg/m. Gen:      No acute distress HEENT:  sclera anicteric Abd:      soft,  non-tender; no palpable masses, no distension Ext:    No edema Neuro: alert and oriented x 3 Psych: normal mood and affect  Data Reviewed:  Reviewed labs, radiology imaging, old records and pertinent past GI work up   Assessment and Plan/Recommendations:  41 year old very pleasant female with history of hiatal hernia, GERD, irritable bowel syndrome all discomfort and cramping  GERD: Continue antireflux measures  Irritable bowel syndrome: Advised patient to drink Kefir or eat plain yogurt to improve gut health.  Avoid artificial sweeteners or added sugar.  Avoid probiotics to prevent bacterial overgrowth or SIBO  Abdominal cramping: Use dicyclomine 10 mg every 8 hours as needed as needed for abdominal discomfort and cramping  Return in 4 months or sooner if needed  The patient was provided an opportunity to ask questions and all were answered. The patient agreed with the plan and demonstrated an understanding of the instructions.  Damaris Hippo , MD    CC: Moran, Olivia Mackie *

## 2022-04-11 NOTE — Patient Instructions (Addendum)
We have sent the following medications to your pharmacy for you to pick up at your convenience:         Dicyclomine  Drink yogurt; drink kefir or plain yogurt with no added sugar  Follow antireflux measures as below...  Please follow with Dr Silverio Decamp in 4 months.  If you are age 43 or older, your body mass index should be between 23-30. Your Body mass index is 24.8 kg/m. If this is out of the aforementioned range listed, please consider follow up with your Primary Care Provider.  If you are age 36 or younger, your body mass index should be between 19-25. Your Body mass index is 24.8 kg/m. If this is out of the aformentioned range listed, please consider follow up with your Primary Care Provider.   ________________________________________________________  The [facility] providers would like to encourage you to use [facility] to communicate with providers for non-urgent requests or questions.  Due to long hold times on the telephone, sending your provider a message by [facility] may be a faster and more efficient way to get a response.  Please allow 48 business hours for a response.  Please remember that this is for non-urgent requests.  _______________________________________________________  Due to recent changes in healthcare laws, you may see the results of your imaging and laboratory studies on MyChart before your provider has had a chance to review them.  We understand that in some cases there may be results that are confusing or concerning to you. Not all laboratory results come back in the same time frame and the provider may be waiting for multiple results in order to interpret others.  Please give [place] 48 hours in order for your provider to thoroughly review all the results before contacting the office for clarification of your results.

## 2022-04-18 ENCOUNTER — Encounter: Payer: Self-pay | Admitting: Gastroenterology

## 2022-04-18 ENCOUNTER — Ambulatory Visit: Payer: BC Managed Care – PPO | Admitting: "Endocrinology

## 2022-04-25 ENCOUNTER — Other Ambulatory Visit: Payer: Self-pay | Admitting: Allergy & Immunology

## 2022-04-25 ENCOUNTER — Telehealth: Payer: Self-pay | Admitting: Family Medicine

## 2022-04-25 DIAGNOSIS — G43119 Migraine with aura, intractable, without status migrainosus: Secondary | ICD-10-CM | POA: Diagnosis not present

## 2022-04-25 DIAGNOSIS — M542 Cervicalgia: Secondary | ICD-10-CM | POA: Diagnosis not present

## 2022-04-25 DIAGNOSIS — G43719 Chronic migraine without aura, intractable, without status migrainosus: Secondary | ICD-10-CM | POA: Diagnosis not present

## 2022-04-25 NOTE — Telephone Encounter (Signed)
Patient would like to refill the following medication:  levothyroxine (SYNTHROID) 75 MCG tablet.  Confirm [facility] [address] (not in [facility]

## 2022-04-25 NOTE — Telephone Encounter (Signed)
LVM for patient to call back.   Amarri Michaelson,cma  

## 2022-04-26 ENCOUNTER — Other Ambulatory Visit: Payer: Self-pay

## 2022-04-26 DIAGNOSIS — E039 Hypothyroidism, unspecified: Secondary | ICD-10-CM

## 2022-04-26 MED ORDER — LEVOTHYROXINE SODIUM 75 MCG PO TABS: 75.0000 ug | ORAL_TABLET | Freq: Every day | ORAL | 1 refills | Status: DC

## 2022-04-26 NOTE — Telephone Encounter (Signed)
Medication sent to correct pharmacy per patient request.  Ronen Bromwell,cma

## 2022-05-05 ENCOUNTER — Telehealth: Payer: Self-pay | Admitting: Family Medicine

## 2022-05-05 ENCOUNTER — Other Ambulatory Visit: Payer: Self-pay

## 2022-05-05 DIAGNOSIS — G8929 Other chronic pain: Secondary | ICD-10-CM

## 2022-05-05 NOTE — Telephone Encounter (Signed)
Ankle MRI order placed. Pt sent a MyChart message to inform her.

## 2022-05-05 NOTE — Telephone Encounter (Signed)
Patient called stating that she is still having issues with her ankle and would like to know if Dr Georgina Snell could go ahead and order the MRI for her? Please advise.

## 2022-05-07 ENCOUNTER — Other Ambulatory Visit: Payer: BC Managed Care – PPO

## 2022-05-08 ENCOUNTER — Ambulatory Visit (INDEPENDENT_AMBULATORY_CARE_PROVIDER_SITE_OTHER): Payer: BC Managed Care – PPO

## 2022-05-08 ENCOUNTER — Encounter: Payer: Self-pay | Admitting: Family Medicine

## 2022-05-08 DIAGNOSIS — M25572 Pain in left ankle and joints of left foot: Secondary | ICD-10-CM

## 2022-05-08 DIAGNOSIS — G8929 Other chronic pain: Secondary | ICD-10-CM | POA: Diagnosis not present

## 2022-05-08 DIAGNOSIS — M25472 Effusion, left ankle: Secondary | ICD-10-CM | POA: Diagnosis not present

## 2022-05-08 DIAGNOSIS — S86312A Strain of muscle(s) and tendon(s) of peroneal muscle group at lower leg level, left leg, initial encounter: Secondary | ICD-10-CM | POA: Diagnosis not present

## 2022-05-09 ENCOUNTER — Ambulatory Visit: Payer: BC Managed Care – PPO | Admitting: Family Medicine

## 2022-05-09 VITALS — BP 108/74 | HR 83 | Ht 60.0 in | Wt 127.8 lb

## 2022-05-09 DIAGNOSIS — G8929 Other chronic pain: Secondary | ICD-10-CM

## 2022-05-09 DIAGNOSIS — M7672 Peroneal tendinitis, left leg: Secondary | ICD-10-CM | POA: Diagnosis not present

## 2022-05-09 DIAGNOSIS — M542 Cervicalgia: Secondary | ICD-10-CM | POA: Diagnosis not present

## 2022-05-09 DIAGNOSIS — G43119 Migraine with aura, intractable, without status migrainosus: Secondary | ICD-10-CM | POA: Diagnosis not present

## 2022-05-09 DIAGNOSIS — M25572 Pain in left ankle and joints of left foot: Secondary | ICD-10-CM | POA: Diagnosis not present

## 2022-05-09 NOTE — Progress Notes (Signed)
I, Peterson Lombard, LAT, ATC acting as a scribe for Lynne Leader, MD.  Sarah Moran is a 41 y.o. female who presents to [place] at [facility] today for f/u of L lateral ankle due to lateral ankle sprain and peroneal tenosynovitis. Pt was last seen by Dr. Georgina Snell on 04/04/22 and was advised to cont HEP and compression sleeve. Pt sent a called the office on 05/05/22 c/o cont'd L ankle pain and requesting MRI, ordered placed. Today, pt reports pain varies in intensity and location. Pt notes the aggravating factors also vary.   Dx imaging: 05/08/22 L ankle MRI 02/21/22 L ankle XR             01/27/22 L ankle XR  Pertinent review of systems: No fevers or chills  Relevant historical information: Hypothyroidism.  Migraine headaches   Exam:  BP 108/74   Pulse 83   Ht 5' (1.524 m)   Wt 127 lb 12.8 oz (58 kg)   SpO2 99%   BMI 24.96 kg/m  General: Well Developed, well nourished, and in no acute distress.   MSK: Left ankle no effusion normal-appearing normal motion.  Some pain at the lateral malleolus.    Lab and Radiology Results No results found for this or any previous visit (from the past 72 hour(s)). MR ANKLE LEFT WO CONTRAST  Result Date: 05/09/2022 CLINICAL DATA:  Ankle pain, instability suspected. Ankle pain for 2 months following twisting injury. No previous relevant surgery. EXAM: MRI OF THE LEFT ANKLE WITHOUT CONTRAST TECHNIQUE: Multiplanar, multisequence MR imaging of the ankle was performed. No intravenous contrast was administered. COMPARISON:  Radiographs 02/21/2022.  Ultrasound 04/04/2022 FINDINGS: TENDONS Peroneal: The peroneus brevis tendon is flattened with mild longitudinal split tearing at the level of the lateral malleolus. The peroneus longus tendon appears normal. There is a small amount of fluid within the peroneal tendon sheath. The peroneal tendons are normally located. Posteromedial: Intact and normally positioned. Anterior: Intact and normally  positioned. Achilles: Intact. Plantar Fascia: Intact. LIGAMENTS Lateral: The anterior talofibular ligament is mildly indistinct and thickening, likely due to a subacute sprain. The posterior talofibular and calcaneofibular ligaments appear intact.The inferior tibiofibular ligaments appear intact. Medial: The deltoid and visualized portions of the spring ligament appear intact. CARTILAGE AND BONES Ankle Joint: No significant ankle joint effusion. The talar dome and tibial plafond are intact. Subtalar Joints/Sinus Tarsi: Small subtalar joint effusion. No subtalar arthropathy or abnormality of the tarsal sinus identified. Bones: No significant extra-articular osseous findings. Other: Mild lateral subcutaneous edema without focal fluid collection. IMPRESSION: 1. Suspected subacute partial tear of the anterior talofibular ligament. No ligamentous disruption identified. Findings could reflect early anterolateral impingement in the appropriate clinical context. 2. Small longitudinal split tear of the peroneus brevis tendon with associated peroneal tenosynovitis as seen on ultrasound. 3. The additional ankle tendons and ligaments appear normal. 4. No acute osseous findings. Electronically Signed   By: Richardean Sale M.D.   On: 05/09/2022 12:11     I, Lynne Leader, personally (independently) visualized and performed the interpretation of the images attached in this note.   Assessment and Plan: 41 y.o. female with left ankle pain due primarily to the peroneal tenosynovitis and partial tear seen on MRI.  The ATFL tendon partial tear is thought to be due to the ankle sprain that she had originally.  We discussed options.  Plan for continued home exercise program and watchful waiting.  She does not have a severe ankle internal derangement problem that  I was worried about that would require surgery.  We will check back in 3 months if not improved would consider injection then.  We very much would like to avoid injection if  possible.  We are avoiding nitroglycerin patch protocol as well to reduce risk of headaches.  She has a migraine headache history.      Discussed warning signs or symptoms. Please see discharge instructions. Patient expresses understanding.   The above documentation has been reviewed and is accurate and complete Lynne Leader, M.D.   Total encounter time 20 minutes including face-to-face time with the patient and, reviewing past medical record, and charting on the date of service.   Reviewed MRI discussed treatment plan and options.

## 2022-05-09 NOTE — Patient Instructions (Signed)
Thank you for coming in today.   Continue working on home exercises.   Check back in 3 months. If not better, we can consider doing a steroid injection

## 2022-05-11 ENCOUNTER — Encounter: Payer: Self-pay | Admitting: Family Medicine

## 2022-05-11 ENCOUNTER — Ambulatory Visit: Payer: BC Managed Care – PPO | Admitting: Family Medicine

## 2022-05-11 ENCOUNTER — Other Ambulatory Visit (HOSPITAL_COMMUNITY)
Admission: RE | Admit: 2022-05-11 | Discharge: 2022-05-11 | Disposition: A | Discharge status: Home / Self Care | Payer: BC Managed Care – PPO | Source: Ambulatory Visit | Attending: Family Medicine | Admitting: Family Medicine

## 2022-05-11 VITALS — BP 110/60 | HR 65 | Temp 98.8°F | Ht 60.0 in | Wt 126.8 lb

## 2022-05-11 DIAGNOSIS — R6882 Decreased libido: Secondary | ICD-10-CM | POA: Insufficient documentation

## 2022-05-11 DIAGNOSIS — N898 Other specified noninflammatory disorders of vagina: Secondary | ICD-10-CM | POA: Insufficient documentation

## 2022-05-11 DIAGNOSIS — R300 Dysuria: Secondary | ICD-10-CM | POA: Diagnosis not present

## 2022-05-11 DIAGNOSIS — R809 Proteinuria, unspecified: Secondary | ICD-10-CM

## 2022-05-11 DIAGNOSIS — Z23 Encounter for immunization: Secondary | ICD-10-CM | POA: Diagnosis not present

## 2022-05-11 DIAGNOSIS — R42 Dizziness and giddiness: Secondary | ICD-10-CM

## 2022-05-11 DIAGNOSIS — E039 Hypothyroidism, unspecified: Secondary | ICD-10-CM | POA: Diagnosis not present

## 2022-05-11 DIAGNOSIS — B3731 Acute candidiasis of vulva and vagina: Secondary | ICD-10-CM | POA: Diagnosis not present

## 2022-05-11 DIAGNOSIS — F320 Major depressive disorder, single episode, mild: Secondary | ICD-10-CM

## 2022-05-11 DIAGNOSIS — G43909 Migraine, unspecified, not intractable, without status migrainosus: Secondary | ICD-10-CM

## 2022-05-11 LAB — POCT URINALYSIS DIPSTICK
Bilirubin, UA: NEGATIVE
Blood, UA: NEGATIVE
Glucose, UA: NEGATIVE
Ketones, UA: NEGATIVE
Nitrite, UA: NEGATIVE
Protein, UA: POSITIVE — AB
Spec Grav, UA: 1.015 (ref 1.010–1.025)
Urobilinogen, UA: 0.2 E.U./dL
pH, UA: 7 (ref 5.0–8.0)

## 2022-05-11 LAB — TSH: TSH: 1.58 u[IU]/mL (ref 0.35–5.50)

## 2022-05-11 NOTE — Assessment & Plan Note (Signed)
Many of the symptoms the patient is having could be side effects of the Topamax.  I encouraged her to discuss this with her headache specialist.  Discussed if needed we could refer her to a different headache specialist.

## 2022-05-11 NOTE — Assessment & Plan Note (Signed)
We will send urine for culture.  Discussed potential for seeing urology if she does not have a UTI.  She likely did have some blood in her urine.

## 2022-05-11 NOTE — Assessment & Plan Note (Signed)
Likely related to the Topamax.  She will discuss this further with her headache specialist.  She will also follow-up with GYN as well.

## 2022-05-11 NOTE — Progress Notes (Signed)
Tommi Rumps, MD Phone: [phone]  Sarah Moran is a 41 y.o. female who presents today for follow-up.  Hypothyroidism: Continues on Synthroid 75 mcg daily.  Headaches: She has chronic migraine history.  She is seeing a headache physician in [place].  They have her on Topamax which has helped significantly with her migraines.  Since going on the Topamax has had numerous potential side effects with tingling all over, recurrent vertigo, and libido issues.  She notes lack of sensation during intercourse and has inability to orgasm.  She notes she cries a lot and has some depression.  No SI.  She describes the vertigo as a spinning sensation that is usually brief and comes on on its own.  She notes she did have some vaginal swelling as well.  She contacted GYN and notes they advised her to discuss this with her headache physician given that it could be a medication side effect.  She notes that a physician advised that he had not heard of that as a side effect.  She notes all of the symptoms started with the Topamax and specifically started when they increase the dose to 75 mg daily.  Patient notes she is not concerned about STIs as she has had the same partner for many years.  Dysuria: Patient has had some mild dysuria that usually occurs after intercourse.  She does note some vaginal discharge and had a bad smell though that has improved.  She notes vaginal swelling is less than it was.  She is status post hysterectomy.  She sees GYN in September.  She noted a small amount of red material came out with a single urination and has not had any since then.  Social History   Tobacco Use  Smoking Status Never  Smokeless Tobacco Never    Current Outpatient Medications on File Prior to Visit  Medication Sig Dispense Refill   baclofen (LIORESAL) 10 MG tablet      Cholecalciferol (VITAMIN D3) 50 MCG (2000 UT) capsule Take 2,000 Units by mouth daily.     Cod Liver Oil CAPS Take 1 capsule by  mouth at bedtime.      diclofenac Sodium (VOLTAREN) 1 % GEL Apply 2 g topically 4 (four) times daily.     Fluticasone Propionate (XHANCE) 93 MCG/ACT EXHU Place 1-2 puffs into the nose daily as needed. 16 mL 5   levocetirizine (XYZAL) 5 MG tablet TAKE 1 TABLET BY MOUTH EVERY DAY IN THE EVENING 90 tablet 0   levothyroxine (SYNTHROID) 75 MCG tablet Take 1 tablet (75 mcg total) by mouth daily. 90 tablet 1   SUMAtriptan (IMITREX) 100 MG tablet      topiramate (TOPAMAX) 25 MG tablet Take 25 mg by mouth 2 (two) times daily.     No current facility-administered medications on file prior to visit.     ROS see history of present illness  Objective  Physical Exam Vitals:   05/11/22 0901  BP: 110/60  Pulse: 65  Temp: 98.8 F (37.1 C)  SpO2: 99%    BP Readings from Last 3 Encounters:  05/11/22 110/60  05/09/22 108/74  04/11/22 102/68   Wt Readings from Last 3 Encounters:  05/11/22 126 lb 12.8 oz (57.5 kg)  05/09/22 127 lb 12.8 oz (58 kg)  04/11/22 127 lb (57.6 kg)    Physical Exam Constitutional:      General: She is not in acute distress.    Appearance: She is not diaphoretic.  HENT:     Right  Ear: Tympanic membrane normal.     Left Ear: Tympanic membrane normal.  Cardiovascular:     Rate and Rhythm: Normal rate and regular rhythm.     Heart sounds: Normal heart sounds.  Pulmonary:     Effort: Pulmonary effort is normal.     Breath sounds: Normal breath sounds.  Genitourinary:    Comments: Fulton Mole, CMA served as chaperone, normal labia, normal vaginal mucosa, cervix is surgically absent, small amount of white discharge in the vagina Skin:    General: Skin is warm and dry.  Neurological:     Mental Status: She is alert.     Comments: CN 3-12 intact, 5/5 strength in bilateral biceps, triceps, grip, quads, hamstrings, plantar and dorsiflexion, sensation to light touch intact in bilateral UE and LE, normal gait, normal rapid alternating movements, normal finger-to-nose,  negative Romberg, no pronator drift      Assessment/Plan: Please see individual problem list.  Problem List Items Addressed This Visit     Hypothyroidism (Chronic)    Check TSH.  Continue Synthroid 75 mcg daily.      Relevant Orders   TSH   Migraine (Chronic)    Many of the symptoms the patient is having could be side effects of the Topamax.  I encouraged her to discuss this with her headache specialist.  Discussed if needed we could refer her to a different headache specialist.      Decreased libido    Likely related to the Topamax.  She will discuss this further with her headache specialist.  She will also follow-up with GYN as well.      Depression, major, single episode, mild ([place])    I suspect this is related to the Topamax and the side effects that she is having.  I encouraged her to discuss switching this medication with her headache specialist.      Dysuria    We will send urine for culture.  Discussed potential for seeing urology if she does not have a UTI.  She likely did have some blood in her urine.      Vaginal discharge - Primary    Swab completed today.  We will contact her with the results.  Testing for yeast, BV, trichomonas, and gonorrhea and chlamydia.      Relevant Orders   Cervicovaginal ancillary only( [place])   Vertigo    Likely a side effect of the Topamax.  She has an upcoming appointment with ENT and she will discuss that with them as well.  I encouraged her to discuss changing the Topamax to something different with her headache specialist.      Other Visit Diagnoses     Burning with urination       Relevant Orders   POCT Urinalysis Dipstick (Completed)   Urine Culture   Need for immunization against influenza       Relevant Orders   Flu Vaccine QUAD 76moIM (Fluarix, Fluzone & Alfiuria Quad PF) (Completed)   Proteinuria, unspecified type       Relevant Orders   Protein / creatinine ratio, urine       Return in about 3 months  (around 08/11/2022).   ETommi Rumps MD [place]

## 2022-05-11 NOTE — Patient Instructions (Signed)
Nice to see you. We will get lab work today. Please talk to your headache specialist about the Topamax as this could be contributing to a lot of the symptoms that you are having. Please discuss your vertigo with ENT when you see them.

## 2022-05-11 NOTE — Assessment & Plan Note (Signed)
Likely a side effect of the Topamax.  She has an upcoming appointment with ENT and she will discuss that with them as well.  I encouraged her to discuss changing the Topamax to something different with her headache specialist.

## 2022-05-11 NOTE — Assessment & Plan Note (Signed)
I suspect this is related to the Topamax and the side effects that she is having.  I encouraged her to discuss switching this medication with her headache specialist.

## 2022-05-11 NOTE — Assessment & Plan Note (Signed)
Check TSH.  Continue Synthroid 75 mcg daily. 

## 2022-05-11 NOTE — Assessment & Plan Note (Signed)
Swab completed today.  We will contact her with the results.  Testing for yeast, BV, trichomonas, and gonorrhea and chlamydia.

## 2022-05-12 LAB — PROTEIN / CREATININE RATIO, URINE
Creatinine, Urine: 21 mg/dL (ref 20–275)
Total Protein, Urine: <4 mg/dL — ABNORMAL LOW (ref 5–24)

## 2022-05-12 LAB — CERVICOVAGINAL ANCILLARY ONLY
Bacterial Vaginitis (gardnerella): NEGATIVE
Candida Glabrata: NEGATIVE
Candida Vaginitis: POSITIVE — AB
Chlamydia: NEGATIVE
Comment: NEGATIVE
Comment: NEGATIVE
Comment: NEGATIVE
Comment: NEGATIVE
Comment: NEGATIVE
Comment: NORMAL
Neisseria Gonorrhea: NEGATIVE
Trichomonas: NEGATIVE

## 2022-05-12 LAB — URINE CULTURE
MICRO NUMBER:: 13820617
SPECIMEN QUALITY:: ADEQUATE

## 2022-05-16 ENCOUNTER — Encounter: Payer: Self-pay | Admitting: Allergy

## 2022-05-16 ENCOUNTER — Ambulatory Visit: Payer: BC Managed Care – PPO | Admitting: Allergy

## 2022-05-16 VITALS — BP 104/66 | HR 70 | Temp 98.8°F | Resp 16 | Ht 60.0 in | Wt 125.5 lb

## 2022-05-16 DIAGNOSIS — J3089 Other allergic rhinitis: Secondary | ICD-10-CM | POA: Diagnosis not present

## 2022-05-16 DIAGNOSIS — G43809 Other migraine, not intractable, without status migrainosus: Secondary | ICD-10-CM

## 2022-05-16 DIAGNOSIS — T781XXD Other adverse food reactions, not elsewhere classified, subsequent encounter: Secondary | ICD-10-CM | POA: Diagnosis not present

## 2022-05-16 DIAGNOSIS — J302 Other seasonal allergic rhinitis: Secondary | ICD-10-CM

## 2022-05-16 NOTE — Progress Notes (Signed)
Follow-up Note  RE: Sarah Moran MRN: [id number] DOB: [birth year] Date of Office Visit: 05/16/2022   History of present illness: Sarah Moran is a 41 y.o. female presenting today for follow-up of allergic rhinitis.  She also has history of migraines as well as vertigo.  She was last seen in the office on 01/14/2022 by Dr. Ernst Bowler. Since last visit she states she did cut the grass on Friday and this caused her to have a runny nose, nosebleeds and headaches for a few days.  She was wearing a K N95 for this activity.  She is not going to be cutting the grass anymore.  Her son typically does this activity but they were out of town at a golf event. She also states when she ate popcorn it caused a rash around the mouth and roof of the mouth and gums.  She states this has happened several times with popcorn.  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  She tried Popcorners and this caused it too.   The rash can last for days to weeks.  She is on a diet for her headaches thus she has not been eating a lot of corn products in general.  She has not had regular corn to know if she can tolerate this.  She states it was hard to eat due to her mouth hurting.     She is taking xyzal in the morning and zyrtec at night. She states it does seem to be working this antihistamine combination for her allergy symptom control.   She does Xhance once a day as if takes it twice a day causes migraines.   She is getting steroid injections in her head for her migraine control.    Review of systems: Review of Systems  Constitutional: Negative.   HENT:         See HPI  Eyes: Negative.   Respiratory: Negative.    Cardiovascular: Negative.   Gastrointestinal: Negative.   Musculoskeletal: Negative.   Skin: Negative.   Allergic/Immunologic: Negative.   Neurological:        See HPI     All other systems negative unless noted above in HPI  Past  medical/social/surgical/family history have been reviewed and are unchanged unless specifically indicated below.  No changes  Medication List: Current Outpatient Medications  Medication Sig Dispense Refill   baclofen (LIORESAL) 10 MG tablet as needed.     cetirizine (ZYRTEC ALLERGY) 10 MG tablet Take 10 mg by mouth.     Cholecalciferol (VITAMIN D3) 50 MCG (2000 UT) capsule Take 2,000 Units by mouth daily.     Cod Liver Oil CAPS Take 1 capsule by mouth at bedtime.      diclofenac Sodium (VOLTAREN) 1 % GEL Apply 2 g topically 4 (four) times daily.     Fluticasone Propionate (XHANCE) 93 MCG/ACT EXHU Place 1-2 puffs into the nose daily as needed. 16 mL 5   levocetirizine (XYZAL) 5 MG tablet TAKE 1 TABLET BY MOUTH EVERY DAY IN THE EVENING 90 tablet 0   levothyroxine (SYNTHROID) 75 MCG tablet Take 1 tablet (75 mcg total) by mouth daily. 90 tablet 1   topiramate (TOPAMAX) 25 MG tablet Take 25 mg by mouth 2 (two) times daily.     SUMAtriptan (IMITREX) 100 MG tablet  (Patient not taking: Reported on 05/16/2022)     No current facility-administered medications for this visit.     Known medication allergies: Allergies  Allergen Reactions   Darvon [Propoxyphene] Other (See Comments)    Hallucinations    Onion Other (See Comments)    Migraines   Percocet [Oxycodone-Acetaminophen] Hives   Vicodin [Hydrocodone-Acetaminophen] Hives     Physical examination: Blood pressure 104/66, pulse 70, temperature 98.8 F (37.1 C), resp. rate 16, height 5' (1.524 m), weight 125 lb 8 oz (56.9 kg), SpO2 99 %.  General: Alert, interactive, in no acute distress. HEENT: PERRLA, TMs pearly gray, turbinates minimally edematous  with dried blood on the rt anterior nasal septum , post-pharynx non erythematous. Neck: Supple without lymphadenopathy. Lungs: Clear to auscultation without wheezing, rhonchi or rales. {no increased work of breathing. CV: Normal S1, S2 without murmurs. Abdomen: Nondistended,  nontender. Skin: Warm and dry, without lesions or rashes. Extremities:  No clubbing, cyanosis or edema. Neuro:   Grossly intact.  Diagnositics/Labs:  Allergy testing:  Corn testing deferred to IgE levels due to antihistamine use  Assessment and plan:   Seasonal and perennial allergic rhinitis - with overlying migraines (grasses, ragweed, weeds, trees, indoor molds, outdoor molds, cat, and dog)  - Continue taking: Xyzal in the morning and Zyrtec at night. Xhance (fluticasone) 1-2 sprays per nostril daily for congestion control Can use small amount of vaseline on qtip applied to anterior portion of inner nose to help with moisturization.  - You can use an extra dose of the antihistamine, if needed, for breakthrough symptoms.  - Continue nasal saline rinses 1-2 times daily to remove allergens from the nasal cavities as well as help with mucous clearance (this is especially helpful to do before the nasal sprays are given) - Consider allergy shots as a means of long-term control.  Allergy shots "re-train" and "reset" the immune system to ignore environmental allergens and decrease the resulting immune response to those allergens (sneezing, itchy watery eyes, runny nose, nasal congestion, etc).  Allergy shots improve symptoms in 75-85% of patients. Information on allergy shots provided previously.  You can let us know when/if you want to start immunotherapy - Continue your migraine therapy  Adverse food reaction  - oral/facial symptoms after eating several different varieties of popped corn - will obtain corn IgE to help determine if you have developed a frank corn allergy - will call with results of this - continue avoidance of popcorn/corn products at this time - if testing positive then would recommend you have access to epinephrine device  Return in about 6 months   I appreciate the opportunity to take part in Sarah Moran's care. Please do not hesitate to contact me with  questions.  Sincerely,   Prudy Feeler, MD Allergy/Immunology [facility]

## 2022-05-16 NOTE — Patient Instructions (Addendum)
Seasonal and perennial allergic rhinitis - with overlying migraines (grasses, ragweed, weeds, trees, indoor molds, outdoor molds, cat, and dog)  - Continue taking: Xyzal in the morning and Zyrtec at night. Xhance (fluticasone) 1-2 sprays per nostril daily for congestion control Can use small amount of vaseline on qtip applied to anterior portion of inner nose to help with moisturization.  - You can use an extra dose of the antihistamine, if needed, for breakthrough symptoms.  - Continue nasal saline rinses 1-2 times daily to remove allergens from the nasal cavities as well as help with mucous clearance (this is especially helpful to do before the nasal sprays are given) - Consider allergy shots as a means of long-term control.  Allergy shots "re-train" and "reset" the immune system to ignore environmental allergens and decrease the resulting immune response to those allergens (sneezing, itchy watery eyes, runny nose, nasal congestion, etc).  Allergy shots improve symptoms in 75-85% of patients. Information on allergy shots provided previously.  You can let us know when/if you want to start immunotherapy - Continue your migraine therapy  Adverse food reaction  - oral/facial symptoms after eating several different varieties of popped corn - will obtain corn IgE to help determine if you have developed a frank corn allergy - will call with results of this - continue avoidance of popcorn/corn products at this time - if testing positive then would recommend you have access to epinephrine device  Return in about 6 months

## 2022-05-18 ENCOUNTER — Ambulatory Visit: Payer: BC Managed Care – PPO | Admitting: Allergy & Immunology

## 2022-05-18 LAB — ALLERGEN, CORN F8: Allergen Corn, IgE: <0.1 kU/L

## 2022-05-26 DIAGNOSIS — G43719 Chronic migraine without aura, intractable, without status migrainosus: Secondary | ICD-10-CM | POA: Diagnosis not present

## 2022-05-26 DIAGNOSIS — M542 Cervicalgia: Secondary | ICD-10-CM | POA: Diagnosis not present

## 2022-05-31 ENCOUNTER — Encounter: Payer: Self-pay | Admitting: Obstetrics & Gynecology

## 2022-05-31 ENCOUNTER — Ambulatory Visit (INDEPENDENT_AMBULATORY_CARE_PROVIDER_SITE_OTHER): Payer: BC Managed Care – PPO | Admitting: Obstetrics & Gynecology

## 2022-05-31 VITALS — BP 100/60 | HR 76 | Resp 16 | Ht 60.0 in | Wt 129.0 lb

## 2022-05-31 DIAGNOSIS — N83299 Other ovarian cyst, unspecified side: Secondary | ICD-10-CM | POA: Diagnosis not present

## 2022-05-31 DIAGNOSIS — Z01419 Encounter for gynecological examination (general) (routine) without abnormal findings: Secondary | ICD-10-CM

## 2022-05-31 NOTE — Progress Notes (Signed)
Subjective:     Sarah Moran is a 41 y.o. G2 P75 female here for a routine exam.  Current complaints: none.  Personal health questionnaire reviewed: yes.   Gynecologic History No LMP recorded. Patient has had a hysterectomy. Contraception: status post hysterectomy Last Pap: before hyst at age 39. Results were: normal Last mammogram: 11-2021. Results were: normal  Obstetric History OB History  Gravida Para Term Preterm AB Living  '2 2 2     2  '$ SAB IAB Ectopic Multiple Live Births          2    # Outcome Date GA Lbr Len/2nd Weight Sex Delivery Anes PTL Lv  2 Term 08/15/02 [redacted]w[redacted]d 7 lb 11.2 oz (3.493 kg) M Vag-Spont     1 Term 05/10/00 [id number] lb 1.9 oz (3.23 kg) M Vag-Spont        The following portions of the patient's history were reviewed and updated as appropriate: allergies, current medications, past family history, past medical history, past social history, past surgical history, and problem list.  Review of Systems Pertinent items noted in HPI and remainder of comprehensive ROS otherwise negative.    Objective:    BP 100/60   Pulse 76   Resp 16   Ht 5' (1.524 m) Comment: patient reports  Wt 129 lb (58.5 kg)   SpO2 99%   BMI 25.19 kg/m  General appearance: alert, cooperative, and no distress Abdomen: normal findings: no organomegaly and soft, non-tender Pelvic: cervix normal in appearance, external genitalia normal, no adnexal masses or tenderness, no cervical motion tenderness, uterus normal size, shape, and consistency, and vagina normal without discharge Extremities: extremities normal, atraumatic, no cyanosis or edema Skin: Skin color, texture, turgor normal. No rashes or lesions Neurologic: Grossly normal    Assessment:  GYN annual exam done today  Healthy female exam.    Plan:    Pelvic usKoreardered for hx of ovarian cysts

## 2022-06-08 ENCOUNTER — Encounter: Payer: Self-pay | Admitting: "Endocrinology

## 2022-06-08 ENCOUNTER — Ambulatory Visit: Payer: BC Managed Care – PPO | Admitting: "Endocrinology

## 2022-06-08 VITALS — BP 92/60 | HR 68 | Ht 60.0 in | Wt 129.2 lb

## 2022-06-08 DIAGNOSIS — Z85850 Personal history of malignant neoplasm of thyroid: Secondary | ICD-10-CM | POA: Diagnosis not present

## 2022-06-08 DIAGNOSIS — E890 Postprocedural hypothyroidism: Secondary | ICD-10-CM | POA: Diagnosis not present

## 2022-06-08 NOTE — Progress Notes (Signed)
Endocrinology Consult Note                                            06/08/2022, 8:51 AM   Subjective:    Patient ID: Sarah Moran, female    DOB: 06/26/1981, PCP Leone Haven, MD   Past Medical History:  Diagnosis Date   Allergy    Anal fissure    Anxiety    no meds   Chronic pelvic pain in female    Family history of breast cancer    My Risk neg 5/18   Family history of cervical cancer    Family history of skin cancer    Family history of throat cancer    Frequent headaches    Gallstone    Gallstones    Genetic testing of female 01/2017   My Risk/BRCA neg   GERD (gastroesophageal reflux disease)    well controlled. Takes gingerroot when symptoms   H. pylori infection 06/19/2018   Hypothyroidism    s/p thyroidectomy - levothyroxine 75 mcg daily   IBS (irritable bowel syndrome)    constipation - takes miralax daily   Increased risk of breast cancer 01/2017   IBIS=18.8%/riskscore=21.9%   Liver cyst 04/2018   Migraine    takes excedrin. She has tried imitrex in the past    Personal history of radiation therapy    Seizure ([place]) 1997    x 1-age 38-after surgery for scoliosis   Thyroid cancer ([place]) 2007   s/p thyroidectomy   Past Surgical History:  Procedure Laterality Date   ANAL RECTAL MANOMETRY N/A 10/12/2018   Procedure: ANO RECTAL MANOMETRY;  Surgeon: Mauri Pole, MD;  Location: [facility];  Service: Endoscopy;  Laterality: N/A;   ANTERIOR AND POSTERIOR REPAIR WITH SACROSPINOUS FIXATION N/A 08/02/2018   Procedure: ANTERIOR AND POSTERIOR REPAIR;  Surgeon: Homero Fellers, MD;  Location: [facility];  Service: Gynecology;  Laterality: N/A;   BACK SURGERY  1997   scoliosis   CHOLECYSTECTOMY N/A 09/21/2018   Procedure: LAPAROSCOPIC CHOLECYSTECTOMY;  Surgeon: Fredirick Maudlin, MD;  Location: [facility];  Service: General;  Laterality: N/A;   COLONOSCOPY     ESOPHAGOGASTRODUODENOSCOPY (EGD) WITH PROPOFOL N/A 06/12/2018   Procedure:  ESOPHAGOGASTRODUODENOSCOPY (EGD) WITH PROPOFOL;  Surgeon: Lin Landsman, MD;  Location: [facility];  Service: Gastroenterology;  Laterality: N/A;   HERNIA REPAIR  1990   bilateral inguinal   PUBOVAGINAL SLING N/A 08/02/2018   Procedure: PUBO-VAGINAL SLING- TVT;  Surgeon: Homero Fellers, MD;  Location: [facility];  Service: Gynecology;  Laterality: N/A;   THYROIDECTOMY  2007   thyroid cancer   TOTAL ABDOMINAL HYSTERECTOMY  2008   dymenorrhea   Social History   Socioeconomic History   Marital status: Married    Spouse name: Audelia Acton   Number of children: 2   Years of education: 12   Highest education level: Not on file  Occupational History   Occupation: Stay at home mom  Tobacco Use   Smoking status: Never   Smokeless tobacco: Never  Vaping Use   Vaping Use: Never used  Substance and Sexual Activity   Alcohol use: Not Currently   Drug use: No   Sexual activity: Yes    Birth control/protection: Surgical    Comment: Hysterectomy  Other Topics Concern   Not on file  Social History Narrative  Sarah Moran grew up in [place]. She currently lives in [place] with her husband, Audelia Acton, and their 2 sons ([place]  and [place] ). 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 [name] belongs to the [employer]. She enjoys outdoor activities on her spare time.   Social Determinants of Health   Financial Resource Strain: Not on file  Food Insecurity: Not on file  Transportation Needs: Not on file  Physical Activity: Not on file  Stress: Not on file  Social Connections: Not on file   Family History  Problem Relation Age of Onset   Hyperlipidemia Father    Hypertension Father    Clotting disorder Sister    Hepatitis B Brother    Breast cancer Paternal Grandmother 3       with mets   Cervical cancer Mother        dx. in her early 61s   Breast cancer Paternal Aunt 34   Throat cancer Paternal Aunt 72   Breast cancer  Paternal Aunt 38   Breast cancer Paternal Aunt        58   Skin cancer Paternal Aunt    Cancer Cousin        unknown type dx. in her late 20s/early 16s   Skin cancer Cousin        13 cancerous moles removed in her 7s   Outpatient Encounter Medications as of 06/08/2022  Medication Sig   Fexofenadine HCl (ALLEGRA ALLERGY PO) Take 1 tablet by mouth daily.   baclofen (LIORESAL) 10 MG tablet as needed.   cetirizine (ZYRTEC ALLERGY) 10 MG tablet Take 10 mg by mouth.   Cholecalciferol (VITAMIN D3) 50 MCG (2000 UT) capsule Take 2,000 Units by mouth daily.   Cod Liver Oil CAPS Take 1 capsule by mouth at bedtime.    diclofenac Sodium (VOLTAREN) 1 % GEL Apply 2 g topically 4 (four) times daily.   Fluticasone Propionate (XHANCE) 93 MCG/ACT EXHU Place 1-2 puffs into the nose daily as needed.   levothyroxine (SYNTHROID) 75 MCG tablet Take 1 tablet (75 mcg total) by mouth daily.   SUMAtriptan (IMITREX) 100 MG tablet  (Patient not taking: Reported on 06/08/2022)   topiramate (TOPAMAX) 25 MG tablet Take 25 mg by mouth 2 (two) times daily.   [DISCONTINUED] levocetirizine (XYZAL) 5 MG tablet TAKE 1 TABLET BY MOUTH EVERY DAY IN THE EVENING   No facility-administered encounter medications on file as of 06/08/2022.   ALLERGIES: Allergies  Allergen Reactions   Darvon [Propoxyphene] Other (See Comments)    Hallucinations    Onion Other (See Comments)    Migraines   Other     popcorn   Percocet [Oxycodone-Acetaminophen] Hives   Vicodin [Hydrocodone-Acetaminophen] Hives    VACCINATION STATUS: Immunization History  Administered Date(s) Administered   DTaP 10/03/2011   Hepb-cpg 01/26/2022, 03/07/2022   Influenza,inj,Quad PF,6+ Mos 08/15/2018, 06/19/2019, 07/01/2020, 07/27/2021, 05/11/2022   Influenza-Unspecified 06/19/2019   PFIZER(Purple Top)SARS-COV-2 Vaccination 12/23/2019, 01/15/2020   Tdap 04/01/2016    HPI [name] is 41 y.o. female who presents today with a medical history as  above. she is being seen in consultation for history of thyroid cancer requested by Leone Haven, MD.   She was diagnosed with thyroid cancer in 2007, was operated on by Dr. Tami Ribas.  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  She received 99.7 mCi  of I-131 on November 11, 2005 at [facility]. I do not see a whole-body scan in her records.  She has been given various dose of Synthroid over the years.  She is currently on Synthroid 75 mcg p.o. daily with good consistency.  She has no acute complaints currently. No subsequent thyroid ultrasound is in her records. She denies family history of thyroid malignancy.  She reports that she does not tolerate generic levothyroxine.  She denies dysphagia, shortness of breath, nor voice change. Does not palpitations, tremors, nor heat intolerance.  Review of Systems  Constitutional: + Minimally fluctuating body weight, no fatigue, no subjective hyperthermia, no subjective hypothermia Eyes: no blurry vision, no xerophthalmia ENT: no sore throat, no nodules palpated in throat, no dysphagia/odynophagia, no hoarseness Cardiovascular: no Chest Pain, no Shortness of Breath, no palpitations, no leg swelling Respiratory: no cough, no shortness of breath Gastrointestinal: no Nausea/Vomiting/Diarhhea Musculoskeletal: no muscle/joint aches Skin: no rashes Neurological: no tremors, no numbness, no tingling, no dizziness Psychiatric: no depression, no anxiety  Objective:       06/08/2022    8:16 AM 05/31/2022    1:13 PM 05/16/2022    9:57 AM  Vitals with BMI  Height _0  _1  _2   Weight 129 lbs 3 oz 129 lbs 125 lbs 8 oz  BMI 25.23 25.95 63.87  Systolic 92 564 332  Diastolic 60 60 66  Pulse 68 76 70    BP 92/60   Pulse 68   Ht 5' (1.524 m)   Wt 129 lb 3.2 oz (58.6 kg)   BMI 25.23 kg/m   Wt Readings from Last 3 Encounters:  06/08/22  129 lb 3.2 oz (58.6 kg)  05/31/22 129 lb (58.5 kg)  05/16/22 125 lb 8 oz (56.9 kg)    Physical Exam  Constitutional:  Body mass index is 25.23 kg/m.,  not in acute distress, normal state of mind Eyes: PERRLA, EOMI, no exophthalmos ENT: moist mucous membranes,+ old thyroidectomy scar in anterior lower neck.  no gross cervical lymphadenopathy Cardiovascular: normal precordial activity, Regular Rate and Rhythm, no Murmur/Rubs/Gallops Respiratory:  adequate breathing efforts, no gross chest deformity, Clear to auscultation bilaterally Gastrointestinal: abdomen soft, Non -tender, No distension, Bowel Sounds present, no gross organomegaly Musculoskeletal: no gross deformities, strength intact in all four extremities Skin: moist, warm, no rashes Neurological: no tremor with outstretched hands, Deep tendon reflexes normal in bilateral lower extremities.  CMP ( most recent) CMP     Component Value Date/Time   NA 138 02/15/2022 1043   NA 142 12/23/2021 0000   NA 140 12/06/2012 1056   K 4.0 02/15/2022 1043   K 3.9 12/06/2012 1056   CL 106 02/15/2022 1043   CL 106 12/06/2012 1056   CO2 25 02/15/2022 1043   CO2 29 12/06/2012 1056   GLUCOSE 89 02/15/2022 1043   GLUCOSE 90 12/06/2012 1056   BUN 11 02/15/2022 1043   BUN 7 12/23/2021 0000   BUN 8 12/06/2012 1056   CREATININE 0.70 02/15/2022 1043   CREATININE 0.79 12/06/2012 1056   CALCIUM 9.5 02/15/2022 1043   CALCIUM 8.7 12/06/2012 1056   PROT 6.3 02/15/2022 1043   PROT 7.2 12/06/2012 1056   ALBUMIN 4.3 02/15/2022 1043   ALBUMIN 3.5 12/06/2012 1056   AST 14 02/15/2022 1043   AST 18 12/06/2012 1056   ALT 9 02/15/2022 1043   ALT 17 12/06/2012 1056   ALKPHOS 57 02/15/2022 1043   ALKPHOS 56 12/06/2012 1056   BILITOT  0.6 02/15/2022 1043   BILITOT 0.2 12/06/2012 1056   GFRNONAA >60 07/24/2020 1118   GFRNONAA >60 12/06/2012 1056   GFRAA >60 05/19/2020 0640   GFRAA >60 12/06/2012 1056     Diabetic Labs (most recent): Lab Results   Component Value Date   HGBA1C 5.9 07/27/2021   HGBA1C 5.7 06/19/2019   HGBA1C 5.6 04/12/2018     Lipid Panel ( most recent) Lipid Panel     Component Value Date/Time   CHOL 196 07/27/2021 0901   TRIG 194 (A) 12/23/2021 0000   HDL 47 12/23/2021 0000   CHOLHDL 5 07/27/2021 0901   VLDL 29.0 07/27/2021 0901   LDLCALC 86 12/23/2021 0000      Lab Results  Component Value Date   TSH 1.58 05/11/2022   TSH 0.25 (L) 03/07/2022   TSH 0.03 Repeated and verified X2. (L) 01/24/2022   TSH 1.00 03/25/2021   TSH 0.75 02/18/2021   TSH 4.56 (H) 12/31/2020   TSH 3.77 11/06/2020   TSH 0.16 (L) 09/24/2020   TSH 0.64 07/31/2020   TSH 0.80 07/01/2020   FREET4 1.08 03/25/2021   FREET4 1.53 07/31/2020   FREET4 1.47 (H) 07/24/2020   FREET4 1.11 07/01/2020   FREET4 1.32 05/13/2020   FREET4 1.72 (H) 03/25/2020   FREET4 1.82 (H) 02/18/2020   FREET4 0.88 12/11/2017   FREET4 0.97 12/09/2014   FREET4 1.28 06/27/2014           Assessment & Plan:   1. Postsurgical hypothyroidism 2. History of thyroid cancer   - Salle D [place]  is being seen at a kind request of Sonnenberg, Angela Adam, MD. - I have reviewed her available thyroid records and clinically evaluated the patient. - Based on these reviews, she has history of papillary thyroid cancer status postsurgery and radioactive iodine thyroid remnant ablation in 2007.  Please complete initial treatment of differentiated thyroid cancer. However, she does not seem to have recent surveillance studies.  He is considered for thyroglobulin measurement as well as thyroid/neck surveillance ultrasound.  Regarding her postsurgical hypothyroidism, she is currently on Synthroid 75 mcg p.o. daily before breakfast.  Her recent labs will include TSH in August 2023. She will need a full set of labs before making adjustment.  She is advised to continue on her current dose for now until next full measurement of TSH/free T4 before her next visit in 2 months.    - We discussed about the correct intake of her thyroid hormone, on empty stomach at fasting, with water, separated by at least 30 minutes from breakfast and other medications,  and separated by more than 4 hours from calcium, iron, multivitamins, acid reflux medications (PPIs). -Patient is made aware of the fact that thyroid hormone replacement is needed for life, dose to be adjusted by periodic monitoring of thyroid function tests.  - I did not initiate any new prescriptions today. - she is advised to maintain close follow up with Leone Haven, MD for primary care needs.   - Time spent with the patient: 50 minutes, of which >50% was spent in  counseling her about her postsurgical hypothyroidism, history of thyroid cancer and the rest in obtaining information about her symptoms, reviewing her previous labs/studies ( including abstractions from other facilities),  evaluations, and treatments,  and developing a plan to confirm diagnosis and long term treatment based on the latest standards of care/guidelines; and documenting her care.  Coreena D Marconi participated in the discussions, expressed understanding, and voiced agreement  with the above plans.  All questions were answered to her satisfaction. she is encouraged to contact clinic should she have any questions or concerns prior to her return visit.  Follow up plan: Return in about 8 weeks (around 08/03/2022) for F/U with Pre-visit Labs, Thyroid / Neck Ultrasound.   Glade Lloyd, MD [facility] [address] [place], [place] [address] Phone: [phone]  Fax: [phone]    06/08/2022, 8:51 AM  This note was partially dictated with voice recognition software. Similar sounding words can be transcribed inadequately or may not  be corrected upon review.

## 2022-06-15 DIAGNOSIS — M542 Cervicalgia: Secondary | ICD-10-CM | POA: Diagnosis not present

## 2022-06-15 DIAGNOSIS — G43719 Chronic migraine without aura, intractable, without status migrainosus: Secondary | ICD-10-CM | POA: Diagnosis not present

## 2022-07-01 ENCOUNTER — Telehealth: Payer: Self-pay | Admitting: "Endocrinology

## 2022-07-01 NOTE — Telephone Encounter (Signed)
New message   The patient asking for lab order to be enter into the system - due to Thyroid might be off, having headache.

## 2022-07-01 NOTE — Telephone Encounter (Signed)
Lab orders are in the system. Pt can have labs drawn anytime.

## 2022-07-04 DIAGNOSIS — G43719 Chronic migraine without aura, intractable, without status migrainosus: Secondary | ICD-10-CM | POA: Diagnosis not present

## 2022-07-04 DIAGNOSIS — M542 Cervicalgia: Secondary | ICD-10-CM | POA: Diagnosis not present

## 2022-07-06 DIAGNOSIS — G43719 Chronic migraine without aura, intractable, without status migrainosus: Secondary | ICD-10-CM | POA: Diagnosis not present

## 2022-07-18 DIAGNOSIS — G43719 Chronic migraine without aura, intractable, without status migrainosus: Secondary | ICD-10-CM | POA: Diagnosis not present

## 2022-07-18 DIAGNOSIS — M542 Cervicalgia: Secondary | ICD-10-CM | POA: Diagnosis not present

## 2022-07-25 ENCOUNTER — Other Ambulatory Visit: Payer: Self-pay | Admitting: Family Medicine

## 2022-07-25 ENCOUNTER — Telehealth: Payer: Self-pay | Admitting: "Endocrinology

## 2022-07-25 DIAGNOSIS — E039 Hypothyroidism, unspecified: Secondary | ICD-10-CM

## 2022-07-25 NOTE — Telephone Encounter (Signed)
Patient is requesting a refill on her thyroid medication. Said she tried to get it from her PCP, I advised her it needs to come from Dr Dorris Fetch since she is being treated here for her thyroid. [facility] [address] she also wants a 30 day supply

## 2022-07-26 MED ORDER — LEVOTHYROXINE SODIUM 75 MCG PO TABS: 75.0000 ug | ORAL_TABLET | Freq: Every day | ORAL | 0 refills | Status: DC

## 2022-07-26 MED ORDER — SYNTHROID 75 MCG PO TABS: 75.0000 ug | ORAL_TABLET | Freq: Every day | ORAL | 0 refills | Status: DC

## 2022-07-26 NOTE — Telephone Encounter (Signed)
Rx for synthroid 50mg 30 day supply sent to [facility]

## 2022-07-27 ENCOUNTER — Ambulatory Visit: Payer: BC Managed Care – PPO | Admitting: Family Medicine

## 2022-07-28 ENCOUNTER — Ambulatory Visit (HOSPITAL_COMMUNITY)
Admission: RE | Admit: 2022-07-28 | Discharge: 2022-07-28 | Disposition: A | Discharge status: Home / Self Care | Payer: BC Managed Care – PPO | Source: Ambulatory Visit | Attending: "Endocrinology | Admitting: "Endocrinology

## 2022-07-28 DIAGNOSIS — Z85850 Personal history of malignant neoplasm of thyroid: Secondary | ICD-10-CM | POA: Diagnosis not present

## 2022-07-28 DIAGNOSIS — E890 Postprocedural hypothyroidism: Secondary | ICD-10-CM | POA: Diagnosis not present

## 2022-08-01 DIAGNOSIS — E890 Postprocedural hypothyroidism: Secondary | ICD-10-CM | POA: Diagnosis not present

## 2022-08-01 DIAGNOSIS — Z85850 Personal history of malignant neoplasm of thyroid: Secondary | ICD-10-CM | POA: Diagnosis not present

## 2022-08-02 ENCOUNTER — Ambulatory Visit (INDEPENDENT_AMBULATORY_CARE_PROVIDER_SITE_OTHER): Payer: BC Managed Care – PPO

## 2022-08-02 DIAGNOSIS — N83299 Other ovarian cyst, unspecified side: Secondary | ICD-10-CM

## 2022-08-03 ENCOUNTER — Encounter: Payer: Self-pay | Admitting: "Endocrinology

## 2022-08-03 ENCOUNTER — Ambulatory Visit: Payer: BC Managed Care – PPO | Admitting: "Endocrinology

## 2022-08-03 VITALS — BP 98/62 | HR 64 | Ht 60.0 in | Wt 129.6 lb

## 2022-08-03 DIAGNOSIS — E039 Hypothyroidism, unspecified: Secondary | ICD-10-CM

## 2022-08-03 DIAGNOSIS — Z85850 Personal history of malignant neoplasm of thyroid: Secondary | ICD-10-CM

## 2022-08-03 MED ORDER — SYNTHROID 88 MCG PO TABS: 88.0000 ug | ORAL_TABLET | Freq: Every day | ORAL | 1 refills | Status: DC

## 2022-08-03 NOTE — Progress Notes (Signed)
08/03/2022, 12:24 PM   Endocrinology follow-up note  Subjective:    Patient ID: Sarah Moran, female    DOB: 09/23/1980, PCP Leone Haven, MD   Past Medical History:  Diagnosis Date   Allergy    Anal fissure    Anxiety    no meds   Chronic pelvic pain in female    Family history of breast cancer    My Risk neg 5/18   Family history of cervical cancer    Family history of skin cancer    Family history of throat cancer    Frequent headaches    Gallstone    Gallstones    Genetic testing of female 01/2017   My Risk/BRCA neg   GERD (gastroesophageal reflux disease)    well controlled. Takes gingerroot when symptoms   H. pylori infection 06/19/2018   Hypothyroidism    s/p thyroidectomy - levothyroxine 75 mcg daily   IBS (irritable bowel syndrome)    constipation - takes miralax daily   Increased risk of breast cancer 01/2017   IBIS=18.8%/riskscore=21.9%   Liver cyst 04/2018   Migraine    takes excedrin. She has tried imitrex in the past    Personal history of radiation therapy    Seizure ([place]) 1997    x 1-age 82-after surgery for scoliosis   Thyroid cancer ([place]) 2007   s/p thyroidectomy   Past Surgical History:  Procedure Laterality Date   ANAL RECTAL MANOMETRY N/A 10/12/2018   Procedure: ANO RECTAL MANOMETRY;  Surgeon: Mauri Pole, MD;  Location: [facility];  Service: Endoscopy;  Laterality: N/A;   ANTERIOR AND POSTERIOR REPAIR WITH SACROSPINOUS FIXATION N/A 08/02/2018   Procedure: ANTERIOR AND POSTERIOR REPAIR;  Surgeon: Homero Fellers, MD;  Location: [facility];  Service: Gynecology;  Laterality: N/A;   BACK SURGERY  1997   scoliosis   CHOLECYSTECTOMY N/A 09/21/2018   Procedure: LAPAROSCOPIC CHOLECYSTECTOMY;  Surgeon: Fredirick Maudlin, MD;  Location: [facility];  Service: General;  Laterality: N/A;   COLONOSCOPY     ESOPHAGOGASTRODUODENOSCOPY (EGD) WITH PROPOFOL N/A 06/12/2018   Procedure:  ESOPHAGOGASTRODUODENOSCOPY (EGD) WITH PROPOFOL;  Surgeon: Lin Landsman, MD;  Location: [facility];  Service: Gastroenterology;  Laterality: N/A;   HERNIA REPAIR  1990   bilateral inguinal   PUBOVAGINAL SLING N/A 08/02/2018   Procedure: PUBO-VAGINAL SLING- TVT;  Surgeon: Homero Fellers, MD;  Location: [facility];  Service: Gynecology;  Laterality: N/A;   THYROIDECTOMY  2007   thyroid cancer   TOTAL ABDOMINAL HYSTERECTOMY  2008   dymenorrhea   Social History   Socioeconomic History   Marital status: Married    Spouse name: Audelia Acton   Number of children: 2   Years of education: 12   Highest education level: Not on file  Occupational History   Occupation: Stay at home mom  Tobacco Use   Smoking status: Never   Smokeless tobacco: Never  Vaping Use   Vaping Use: Never used  Substance and Sexual Activity   Alcohol use: Not Currently   Drug use: No   Sexual activity: Yes    Birth control/protection: Surgical    Comment: Hysterectomy  Other Topics Concern   Not on file  Social  History Narrative   Sarah Moran grew up in [place]. She currently lives in [place] with her husband, Audelia Acton, and their 2 sons ([place]  and [place] ). 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 [name] belongs to the [employer]. She enjoys outdoor activities on her spare time.   Social Determinants of Health   Financial Resource Strain: Not on file  Food Insecurity: Not on file  Transportation Needs: Not on file  Physical Activity: Not on file  Stress: Not on file  Social Connections: Not on file   Family History  Problem Relation Age of Onset   Hyperlipidemia Father    Hypertension Father    Clotting disorder Sister    Hepatitis B Brother    Breast cancer Paternal Grandmother 67       with mets   Cervical cancer Mother        dx. in her early 79s   Breast cancer Paternal Aunt 56   Throat cancer Paternal Aunt 43   Breast cancer  Paternal Aunt 13   Breast cancer Paternal Aunt        47   Skin cancer Paternal Aunt    Cancer Cousin        unknown type dx. in her late 20s/early 3s   Skin cancer Cousin        13 cancerous moles removed in her 65s   Outpatient Encounter Medications as of 08/03/2022  Medication Sig   EMGALITY 120 MG/ML SOAJ Inject into the skin every 30 (thirty) days.   SYNTHROID 88 MCG tablet Take 1 tablet (88 mcg total) by mouth daily before breakfast.   baclofen (LIORESAL) 10 MG tablet as needed.   cetirizine (ZYRTEC ALLERGY) 10 MG tablet Take 10 mg by mouth.   Cholecalciferol (VITAMIN D3) 50 MCG (2000 UT) capsule Take 2,000 Units by mouth daily.   Cod Liver Oil CAPS Take 1 capsule by mouth at bedtime.    diclofenac Sodium (VOLTAREN) 1 % GEL Apply 2 g topically 4 (four) times daily.   Fexofenadine HCl (ALLEGRA ALLERGY PO) Take 1 tablet by mouth daily.   Fluticasone Propionate (XHANCE) 93 MCG/ACT EXHU Place 1-2 puffs into the nose daily as needed.   [DISCONTINUED] SUMAtriptan (IMITREX) 100 MG tablet  (Patient not taking: Reported on 06/08/2022)   [DISCONTINUED] SYNTHROID 75 MCG tablet Take 1 tablet (75 mcg total) by mouth daily.   [DISCONTINUED] topiramate (TOPAMAX) 25 MG tablet Take 25 mg by mouth 2 (two) times daily. (Patient not taking: Reported on 08/03/2022)   No facility-administered encounter medications on file as of 08/03/2022.   ALLERGIES: Allergies  Allergen Reactions   Darvon [Propoxyphene] Other (See Comments)    Hallucinations    Onion Other (See Comments)    Migraines   Other     popcorn   Percocet [Oxycodone-Acetaminophen] Hives   Vicodin [Hydrocodone-Acetaminophen] Hives    VACCINATION STATUS: Immunization History  Administered Date(s) Administered   DTaP 10/03/2011   Hepb-cpg 01/26/2022, 03/07/2022   Influenza,inj,Quad PF,6+ Mos 08/15/2018, 06/19/2019, 07/01/2020, 07/27/2021, 05/11/2022   Influenza-Unspecified 06/19/2019   PFIZER(Purple Top)SARS-COV-2 Vaccination  12/23/2019, 01/15/2020   Tdap 04/01/2016    HPI [name] is 41 y.o. female who presents today with a medical history as above. she is being seen in follow-up after she was seen in consultation for history of thyroid cancer requested by Leone Haven, MD.   She was diagnosed with thyroid cancer in 2007, was operated  on by Dr. Tami Ribas.  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  She received 99.7 mCi of I-131 on November 11, 2005 at [facility]. I do not see a whole-body scan in her records.  She has been given various dose of Synthroid over the years.  She is currently on Synthroid 75 mcg p.o. daily with good consistency.  She has no acute complaints currently. -Her previsit thyroid ultrasound is negative for any significant findings. She denies family history of thyroid malignancy.  She reports that she does not tolerate generic levothyroxine.  She denies dysphagia, shortness of breath, nor voice change. Does not palpitations, tremors, nor heat intolerance.  Review of Systems  Constitutional: + Minimally fluctuating body weight, no fatigue, no subjective hyperthermia, no subjective hypothermia Eyes: no blurry vision, no xerophthalmia ENT: no sore throat, no nodules palpated in throat, no dysphagia/odynophagia, no hoarseness   Objective:       08/03/2022    9:47 AM 06/08/2022    8:16 AM 05/31/2022    1:13 PM  Vitals with BMI  Height _0  _1  _2   Weight 129 lbs 10 oz 129 lbs 3 oz 129 lbs  BMI 25.31 05.69 79.48  Systolic 98 92 016  Diastolic 62 60 60  Pulse 64 68 76    BP 98/62   Pulse 64   Ht 5' (1.524 m)   Wt 129 lb 9.6 oz (58.8 kg)   BMI 25.31 kg/m   Wt Readings from Last 3 Encounters:  08/03/22 129 lb 9.6 oz (58.8 kg)  06/08/22 129 lb 3.2 oz (58.6 kg)  05/31/22 129 lb (58.5 kg)    Physical Exam  Constitutional:  Body mass index is 25.31  kg/m.,  not in acute distress, normal state of mind Eyes: PERRLA, EOMI, no exophthalmos ENT: moist mucous membranes,+ old thyroidectomy scar in anterior lower neck.  no gross cervical lymphadenopathy   CMP ( most recent) CMP     Component Value Date/Time   NA 138 02/15/2022 1043   NA 142 12/23/2021 0000   NA 140 12/06/2012 1056   K 4.0 02/15/2022 1043   K 3.9 12/06/2012 1056   CL 106 02/15/2022 1043   CL 106 12/06/2012 1056   CO2 25 02/15/2022 1043   CO2 29 12/06/2012 1056   GLUCOSE 89 02/15/2022 1043   GLUCOSE 90 12/06/2012 1056   BUN 11 02/15/2022 1043   BUN 7 12/23/2021 0000   BUN 8 12/06/2012 1056   CREATININE 0.70 02/15/2022 1043   CREATININE 0.79 12/06/2012 1056   CALCIUM 9.5 02/15/2022 1043   CALCIUM 8.7 12/06/2012 1056   PROT 6.3 02/15/2022 1043   PROT 7.2 12/06/2012 1056   ALBUMIN 4.3 02/15/2022 1043   ALBUMIN 3.5 12/06/2012 1056   AST 14 02/15/2022 1043   AST 18 12/06/2012 1056   ALT 9 02/15/2022 1043   ALT 17 12/06/2012 1056   ALKPHOS 57 02/15/2022 1043   ALKPHOS 56 12/06/2012 1056   BILITOT 0.6 02/15/2022 1043   BILITOT 0.2 12/06/2012 1056   GFRNONAA >60 07/24/2020 1118   GFRNONAA >60 12/06/2012 1056   GFRAA >60 05/19/2020 0640   GFRAA >60 12/06/2012 1056     Diabetic Labs (most recent): Lab Results  Component Value Date   HGBA1C 5.9 07/27/2021   HGBA1C 5.7 06/19/2019   HGBA1C 5.6 04/12/2018     Lipid Panel ( most recent) Lipid Panel  Component Value Date/Time   CHOL 196 07/27/2021 0901   TRIG 194 (A) 12/23/2021 0000   HDL 47 12/23/2021 0000   CHOLHDL 5 07/27/2021 0901   VLDL 29.0 07/27/2021 0901   LDLCALC 86 12/23/2021 0000      Lab Results  Component Value Date   TSH 2.450 08/01/2022   TSH 1.58 05/11/2022   TSH 0.25 (L) 03/07/2022   TSH 0.03 Repeated and verified X2. (L) 01/24/2022   TSH 1.00 03/25/2021   TSH 0.75 02/18/2021   TSH 4.56 (H) 12/31/2020   TSH 3.77 11/06/2020   TSH 0.16 (L) 09/24/2020   TSH 0.64 07/31/2020    FREET4 1.40 08/01/2022   FREET4 1.08 03/25/2021   FREET4 1.53 07/31/2020   FREET4 1.47 (H) 07/24/2020   FREET4 1.11 07/01/2020   FREET4 1.32 05/13/2020   FREET4 1.72 (H) 03/25/2020   FREET4 1.82 (H) 02/18/2020   FREET4 0.88 12/11/2017   FREET4 0.97 12/09/2014        Thyroid/neck ultrasound on July 28, 2022 FINDINGS: Isthmus: Surgically absent. There is no residual nodular soft tissue within the isthmic resection bed.   Right lobe: Surgically absent. There is no residual nodular soft tissue within the right lobectomy resection bed.   Left lobe: Surgically absent. There is no residual nodular soft tissue within the left lobectomy resection bed.    Assessment & Plan:   1. Postsurgical hypothyroidism 2. History of thyroid cancer   - I have reviewed her new and available thyroid records and clinically evaluated the patient. - Based on these reviews, she has history of papillary thyroid cancer status postsurgery and radioactive iodine thyroid remnant ablation in 2007.  This completes her initial intensive treatment for differentiated thyroid cancer.   Her previsit thyroid/neck  ultrasound is negative. -Her thyroglobulin levels are still pending.   Regarding her postsurgical hypothyroidism, she is currently on Synthroid 75 mcg p.o. daily before breakfast.  Her recent labs states that she would benefit from slight increase in her Synthroid dose.    I discussed and increase her Synthroid to 88 mcg p.o. daily before breakfast.    - We discussed about the correct intake of her thyroid hormone, on empty stomach at fasting, with water, separated by at least 30 minutes from breakfast and other medications,  and separated by more than 4 hours from calcium, iron, multivitamins, acid reflux medications (PPIs). -Patient is made aware of the fact that thyroid hormone replacement is needed for life, dose to be adjusted by periodic monitoring of thyroid function tests.   - she is advised to  maintain close follow up with Leone Haven, MD for primary care needs.   I spent 26 minutes in the care of the patient today including review of labs from Thyroid Function, CMP, and other relevant labs ; imaging/biopsy records (current and previous including abstractions from other facilities); face-to-face time discussing  her lab results and symptoms, medications doses, her options of short and long term treatment based on the latest standards of care / guidelines;   and documenting the encounter.  Jailey D Lyssy  participated in the discussions, expressed understanding, and voiced agreement with the above plans.  All questions were answered to her satisfaction. she is encouraged to contact clinic should she have any questions or concerns prior to her return visit.   Follow up plan: Return in about 6 months (around 02/01/2023) for F/U with Pre-visit Labs.   Glade Lloyd, MD [facility] [address]  [address] [place], [place] [address] Phone: [phone]  Fax: [phone]    08/03/2022, 12:24 PM  This note was partially dictated with voice recognition software. Similar sounding words can be transcribed inadequately or may not  be corrected upon review.

## 2022-08-04 NOTE — Progress Notes (Signed)
I, Peterson Lombard, LAT, ATC acting as a scribe for Sarah Leader, MD.  Rosalio Loud Arwood is a 41 y.o. female who presents to [facility] today for 29-monthf/u chronic L ankle pain. Pt was last seen by Dr. CGeorgina Snellon 05/09/22 and was advised to cont HEP and plan for watchful waiting. Today, pt reports L ankle is feeling somewhat better. Pt c/o cont'd pain esp when hiking and feels a catching, pulling sensation. Pt locates pain to the lateral aspect of the L ankle and on the dorsum of the L foot.   She notes continued pain especially during or following exercise.  This is interfering with her ability to exercise normally at times.  Dx imaging: 05/08/22 L ankle MRI 02/21/22 L ankle XR             01/27/22 L ankle XR  Pertinent review of systems: No fevers or chills  Relevant historical information: Migraine headaches.  History of vaginal prolapse.   Exam:  BP 102/68   Pulse 77   Ht 5' (1.524 m)   Wt 125 lb (56.7 kg)   SpO2 98%   BMI 24.41 kg/m  General: Well Developed, well nourished, and in no acute distress.   MSK: Left ankle some swelling anterior lateral ankle near ATFL and syndesmosis area.  Normal ankle motion.  Stable ligamentous exam.  Intact strength.  Some pain with resisted foot eversion.    Lab and Radiology Results  EXAM: MRI OF THE LEFT ANKLE WITHOUT CONTRAST   TECHNIQUE: Multiplanar, multisequence MR imaging of the ankle was performed. No intravenous contrast was administered.   COMPARISON:  Radiographs 02/21/2022.  Ultrasound 04/04/2022   FINDINGS: TENDONS   Peroneal: The peroneus brevis tendon is flattened with mild longitudinal split tearing at the level of the lateral malleolus. The peroneus longus tendon appears normal. There is a small amount of fluid within the peroneal tendon sheath. The peroneal tendons are normally located.   Posteromedial: Intact and normally positioned.   Anterior: Intact and normally positioned.    Achilles: Intact.   Plantar Fascia: Intact.   LIGAMENTS   Lateral: The anterior talofibular ligament is mildly indistinct and thickening, likely due to a subacute sprain. The posterior talofibular and calcaneofibular ligaments appear intact.The inferior tibiofibular ligaments appear intact.   Medial: The deltoid and visualized portions of the spring ligament appear intact.   CARTILAGE AND BONES   Ankle Joint: No significant ankle joint effusion. The talar dome and tibial plafond are intact.   Subtalar Joints/Sinus Tarsi: Small subtalar joint effusion. No subtalar arthropathy or abnormality of the tarsal sinus identified.   Bones: No significant extra-articular osseous findings.   Other: Mild lateral subcutaneous edema without focal fluid collection.   IMPRESSION: 1. Suspected subacute partial tear of the anterior talofibular ligament. No ligamentous disruption identified. Findings could reflect early anterolateral impingement in the appropriate clinical context. 2. Small longitudinal split tear of the peroneus brevis tendon with associated peroneal tenosynovitis as seen on ultrasound. 3. The additional ankle tendons and ligaments appear normal. 4. No acute osseous findings.     Electronically Signed   By: WRichardean SaleM.D.   On: 05/09/2022 12:11 I, ELynne Moran personally (independently) visualized and performed the interpretation of the images attached in this note.    Diagnostic Limited MSK Ultrasound of: Left ankle Mild joint effusion present in the lateral ankle joint near ATFL region. Partial tear still visible at the peroneal tendon at the lateral malleolus. Impression:  Ankle effusion and partial tear peroneal tendon   Assessment and Plan: 41 y.o. female with persistent left ankle pain.  Patient has a few abnormalities seen on prior MRI 3 months ago that could cause some persistent pain including potential ankle impingement and a partial tear of the  peroneal brevis tendon.  Her symptoms are bothersome enough that she would potentially consider surgery.  I think it is worthwhile for her at this point to have a consultation with a orthopedic surgeon who specializes in this area.  This would be a good second opinion but also the opportunity to talk about her surgical options.  She has not had steroid injections yet certainly we could proceed with that but I am not optimistic that steroid injections would actually solve her problem. Referral placed to Dr. Sharol Given.  PDMP not reviewed this encounter. Orders Placed This Encounter  Procedures   [place] LIMITED JOINT SPACE STRUCTURES LOW LEFT(NO LINKED CHARGES)    Order Specific Question:   Reason for Exam (SYMPTOM  OR DIAGNOSIS REQUIRED)    Answer:   left ankle pain    Order Specific Question:   Preferred imaging location?    Answer:   [place]   Ambulatory referral to Orthopedic Surgery    Referral Priority:   Routine    Referral Type:   Surgical    Referral Reason:   Specialty Services Required    Referred to Provider:   Newt Minion, MD    Requested Specialty:   Orthopedic Surgery    Number of Visits Requested:   1   No orders of the defined types were placed in this encounter.    Discussed warning signs or symptoms. Please see discharge instructions. Patient expresses understanding.   The above documentation has been reviewed and is accurate and complete Sarah Moran, M.D.

## 2022-08-05 ENCOUNTER — Ambulatory Visit: Payer: Self-pay

## 2022-08-05 ENCOUNTER — Ambulatory Visit: Payer: BC Managed Care – PPO | Admitting: Family Medicine

## 2022-08-05 VITALS — BP 102/68 | HR 77 | Ht 60.0 in | Wt 125.0 lb

## 2022-08-05 DIAGNOSIS — M25572 Pain in left ankle and joints of left foot: Secondary | ICD-10-CM

## 2022-08-05 DIAGNOSIS — G8929 Other chronic pain: Secondary | ICD-10-CM | POA: Diagnosis not present

## 2022-08-05 NOTE — Patient Instructions (Signed)
Thank you for coming in today.   You can call and schedule with Dr Sharol Given.   I can do an injection at any time.   Let me know if you need a 2nd opinion after Dr Sharol Given.

## 2022-08-08 DIAGNOSIS — G43719 Chronic migraine without aura, intractable, without status migrainosus: Secondary | ICD-10-CM | POA: Diagnosis not present

## 2022-08-08 DIAGNOSIS — M542 Cervicalgia: Secondary | ICD-10-CM | POA: Diagnosis not present

## 2022-08-08 LAB — T4, FREE: Free T4: 1.4 ng/dL (ref 0.82–1.77)

## 2022-08-08 LAB — TSH: TSH: 2.45 u[IU]/mL (ref 0.450–4.500)

## 2022-08-08 LAB — THYROGLOBULIN LEVEL: Thyroglobulin (TG-RIA): <2 ng/mL

## 2022-08-15 ENCOUNTER — Ambulatory Visit: Payer: BC Managed Care – PPO | Admitting: "Endocrinology

## 2022-08-15 ENCOUNTER — Ambulatory Visit: Payer: BC Managed Care – PPO | Admitting: Family Medicine

## 2022-08-18 ENCOUNTER — Encounter: Payer: Self-pay | Admitting: Family Medicine

## 2022-08-18 ENCOUNTER — Ambulatory Visit: Payer: BC Managed Care – PPO | Admitting: Family Medicine

## 2022-08-18 VITALS — BP 116/70 | HR 61 | Temp 98.9°F | Ht 60.0 in | Wt 130.0 lb

## 2022-08-18 DIAGNOSIS — E785 Hyperlipidemia, unspecified: Secondary | ICD-10-CM | POA: Diagnosis not present

## 2022-08-18 DIAGNOSIS — R310 Gross hematuria: Secondary | ICD-10-CM

## 2022-08-18 DIAGNOSIS — M7672 Peroneal tendinitis, left leg: Secondary | ICD-10-CM | POA: Diagnosis not present

## 2022-08-18 DIAGNOSIS — E039 Hypothyroidism, unspecified: Secondary | ICD-10-CM

## 2022-08-18 DIAGNOSIS — R7303 Prediabetes: Secondary | ICD-10-CM

## 2022-08-18 LAB — LIPID PANEL
Cholesterol: 207 mg/dL — ABNORMAL HIGH (ref 0–200)
HDL: 49.8 mg/dL (ref >39.00)
LDL Cholesterol: 132 mg/dL — ABNORMAL HIGH (ref 0–99)
NonHDL: 156.79
Total CHOL/HDL Ratio: 4
Triglycerides: 122 mg/dL (ref 0.0–149.0)
VLDL: 24.4 mg/dL (ref 0.0–40.0)

## 2022-08-18 LAB — COMPREHENSIVE METABOLIC PANEL
ALT: 7 U/L (ref 0–35)
AST: 13 U/L (ref 0–37)
Albumin: 4.1 g/dL (ref 3.5–5.2)
Alkaline Phosphatase: 53 U/L (ref 39–117)
BUN: 10 mg/dL (ref 6–23)
CO2: 28 mEq/L (ref 19–32)
Calcium: 8.8 mg/dL (ref 8.4–10.5)
Chloride: 105 mEq/L (ref 96–112)
Creatinine, Ser: 0.66 mg/dL (ref 0.40–1.20)
GFR: 107.49 mL/min (ref >60.00)
Glucose, Bld: 88 mg/dL (ref 70–99)
Potassium: 4.2 mEq/L (ref 3.5–5.1)
Sodium: 138 mEq/L (ref 135–145)
Total Bilirubin: 0.6 mg/dL (ref 0.2–1.2)
Total Protein: 6.4 g/dL (ref 6.0–8.3)

## 2022-08-18 LAB — HEMOGLOBIN A1C: Hgb A1c MFr Bld: 5.7 % (ref 4.6–6.5)

## 2022-08-18 NOTE — Assessment & Plan Note (Signed)
Refer to urology.  ?

## 2022-08-18 NOTE — Assessment & Plan Note (Signed)
TSH has been stable.  Endocrinology increased her Synthroid dose.  She will continue Synthroid 88 mcg daily and have her labs rechecked through endocrinology.

## 2022-08-18 NOTE — Progress Notes (Signed)
Tommi Rumps, MD Phone: [phone]  Sarah Moran is a 41 y.o. female who presents today for f/u.  HYPOTHYROIDISM Disease Monitoring Weight changes: no  Skin Changes: no Heat/Cold intolerance: no Reports some hot flashes when she cam off of topamax, though those resolved after about a month.  Medication Monitoring Compliance:  taking synthroid, endo recently increase synthroid dose   Last TSH:   Lab Results  Component Value Date   TSH 2.450 08/01/2022   Gross hematuria: Patient had 1 episode of this a few months ago.  Urine culture was unremarkable.  She reports a history of blood in her urine in the past though has not seen urology in quite some time.  Social History   Tobacco Use  Smoking Status Never  Smokeless Tobacco Never    Current Outpatient Medications on File Prior to Visit  Medication Sig Dispense Refill   baclofen (LIORESAL) 10 MG tablet as needed.     cetirizine (ZYRTEC ALLERGY) 10 MG tablet Take 10 mg by mouth.     Cholecalciferol (VITAMIN D3) 50 MCG (2000 UT) capsule Take 2,000 Units by mouth daily.     Cod Liver Oil CAPS Take 1 capsule by mouth at bedtime.      diclofenac Sodium (VOLTAREN) 1 % GEL Apply 2 g topically 4 (four) times daily.     EMGALITY 120 MG/ML SOAJ Inject into the skin every 30 (thirty) days.     Fexofenadine HCl (ALLEGRA ALLERGY PO) Take 1 tablet by mouth daily.     Fluticasone Propionate (XHANCE) 93 MCG/ACT EXHU Place 1-2 puffs into the nose daily as needed. 16 mL 5   SYNTHROID 88 MCG tablet Take 1 tablet (88 mcg total) by mouth daily before breakfast. 93 tablet 1   No current facility-administered medications on file prior to visit.     ROS see history of present illness  Objective  Physical Exam Vitals:   08/18/22 0850  BP: 116/70  Pulse: 61  Temp: 98.9 F (37.2 C)  SpO2: 99%    BP Readings from Last 3 Encounters:  08/18/22 116/70  08/05/22 102/68  08/03/22 98/62   Wt Readings from Last 3 Encounters:   08/18/22 130 lb (59 kg)  08/05/22 125 lb (56.7 kg)  08/03/22 129 lb 9.6 oz (58.8 kg)    Physical Exam Constitutional:      General: She is not in acute distress.    Appearance: She is not diaphoretic.  Cardiovascular:     Rate and Rhythm: Normal rate and regular rhythm.     Heart sounds: Normal heart sounds.  Pulmonary:     Effort: Pulmonary effort is normal.     Breath sounds: Normal breath sounds.  Skin:    General: Skin is warm and dry.  Neurological:     Mental Status: She is alert.      Assessment/Plan: Please see individual problem list.  Problem List Items Addressed This Visit     Hyperlipidemia (Chronic)    Check lipid panel today.  Previously has had elevated LDL.      Relevant Orders   Comp Met (CMET)   Lipid panel   Hypothyroidism - Primary (Chronic)    TSH has been stable.  Endocrinology increased her Synthroid dose.  She will continue Synthroid 88 mcg daily and have her labs rechecked through endocrinology.      Peroneal tendinitis, left (Chronic)    Ongoing issue.  Sports medicine is referred her to a Psychologist, sport and exercise.  She will keep that appointment.  Prediabetes (Chronic)    Check A1c.      Relevant Orders   HgB A1c   Gross hematuria    Refer to urology.      Relevant Orders   Ambulatory referral to Urology     Return in about 6 months (around 02/16/2023).   Tommi Rumps, MD [place]

## 2022-08-18 NOTE — Assessment & Plan Note (Signed)
Check lipid panel today.  Previously has had elevated LDL.

## 2022-08-18 NOTE — Assessment & Plan Note (Signed)
Ongoing issue.  Sports medicine is referred her to a Psychologist, sport and exercise.  She will keep that appointment.

## 2022-08-18 NOTE — Assessment & Plan Note (Signed)
Check A1c. 

## 2022-08-22 ENCOUNTER — Ambulatory Visit (INDEPENDENT_AMBULATORY_CARE_PROVIDER_SITE_OTHER): Payer: BC Managed Care – PPO | Admitting: Orthopedic Surgery

## 2022-08-22 DIAGNOSIS — S93332A Other subluxation of left foot, initial encounter: Secondary | ICD-10-CM

## 2022-08-22 DIAGNOSIS — S93492A Sprain of other ligament of left ankle, initial encounter: Secondary | ICD-10-CM

## 2022-08-23 ENCOUNTER — Encounter: Payer: Self-pay | Admitting: Urology

## 2022-08-23 ENCOUNTER — Ambulatory Visit (INDEPENDENT_AMBULATORY_CARE_PROVIDER_SITE_OTHER): Payer: BC Managed Care – PPO | Admitting: Obstetrics & Gynecology

## 2022-08-23 ENCOUNTER — Ambulatory Visit: Payer: BC Managed Care – PPO | Admitting: Urology

## 2022-08-23 ENCOUNTER — Encounter: Payer: Self-pay | Admitting: Orthopedic Surgery

## 2022-08-23 VITALS — BP 95/65 | HR 84 | Ht 60.0 in | Wt 130.0 lb

## 2022-08-23 VITALS — BP 103/67 | HR 78 | Wt 129.3 lb

## 2022-08-23 DIAGNOSIS — R310 Gross hematuria: Secondary | ICD-10-CM | POA: Diagnosis not present

## 2022-08-23 DIAGNOSIS — N83299 Other ovarian cyst, unspecified side: Secondary | ICD-10-CM | POA: Diagnosis not present

## 2022-08-23 DIAGNOSIS — K5909 Other constipation: Secondary | ICD-10-CM

## 2022-08-23 LAB — MICROSCOPIC EXAMINATION

## 2022-08-23 LAB — URINALYSIS, COMPLETE
Bilirubin, UA: NEGATIVE
Glucose, UA: NEGATIVE
Ketones, UA: NEGATIVE
Leukocytes,UA: NEGATIVE
Nitrite, UA: NEGATIVE
Protein,UA: NEGATIVE
Specific Gravity, UA: <1.005 — ABNORMAL LOW (ref 1.005–1.030)
Urobilinogen, Ur: 0.2 mg/dL (ref 0.2–1.0)
pH, UA: 5 (ref 5.0–7.5)

## 2022-08-23 LAB — BLADDER SCAN AMB NON-IMAGING

## 2022-08-23 NOTE — Progress Notes (Signed)
08/23/2022 12:45 PM   Sarah Moran [birth year] [id number]  Referring provider: Leone Haven, MD [address] [place],  [place] [address]  Chief Complaint  Patient presents with   New Patient (Initial Visit)   Hematuria    HPI: 41 year old female referred for further evaluation of gross hematuria.  She reports a couple months ago, she had an episode of painless gross hematuria which was self-limited.  She reports that she has had this in the remote past.  She has had several urinalyses over the past year, none of which show any obvious bladder infection.  She reports that several months ago when she saw her PCP, she mentioned that she had some darker colored urine which she felt was probably blood.  She has not seen it since.  She has no change in her urinary symptoms which include baseline mild urgency at times and mild stress incontinence.  She is status post hysterectomy, has no menstrual cycle.  She did have repair of bladder prolapse in the remote past.  She denies any vaginal symptoms today.  No history of kidney stones.  No flank pain.  She did have a CT abdomen pelvis on 01/2022 which was personally reviewed today.  There is no GU pathology or obvious stone burden.   PMH: Past Medical History:  Diagnosis Date   Allergy    Anal fissure    Anxiety    no meds   Chronic pelvic pain in female    Family history of breast cancer    My Risk neg 5/18   Family history of cervical cancer    Family history of skin cancer    Family history of throat cancer    Frequent headaches    Gallstone    Gallstones    Genetic testing of female 01/2017   My Risk/BRCA neg   GERD (gastroesophageal reflux disease)    well controlled. Takes gingerroot when symptoms   H. pylori infection 06/19/2018   Hypothyroidism    s/p thyroidectomy - levothyroxine 75 mcg daily   IBS (irritable bowel syndrome)    constipation - takes miralax daily   Increased risk of breast  cancer 01/2017   IBIS=18.8%/riskscore=21.9%   Liver cyst 04/2018   Migraine    takes excedrin. She has tried imitrex in the past    Personal history of radiation therapy    Seizure ([place]) 1997    x 1-age 58-after surgery for scoliosis   Thyroid cancer ([place]) 2007   s/p thyroidectomy    Surgical History: Past Surgical History:  Procedure Laterality Date   ANAL RECTAL MANOMETRY N/A 10/12/2018   Procedure: ANO RECTAL MANOMETRY;  Surgeon: Mauri Pole, MD;  Location: [facility];  Service: Endoscopy;  Laterality: N/A;   ANTERIOR AND POSTERIOR REPAIR WITH SACROSPINOUS FIXATION N/A 08/02/2018   Procedure: ANTERIOR AND POSTERIOR REPAIR;  Surgeon: Homero Fellers, MD;  Location: [facility];  Service: Gynecology;  Laterality: N/A;   BACK SURGERY  1997   scoliosis   CHOLECYSTECTOMY N/A 09/21/2018   Procedure: LAPAROSCOPIC CHOLECYSTECTOMY;  Surgeon: Fredirick Maudlin, MD;  Location: [facility];  Service: General;  Laterality: N/A;   COLONOSCOPY     ESOPHAGOGASTRODUODENOSCOPY (EGD) WITH PROPOFOL N/A 06/12/2018   Procedure: ESOPHAGOGASTRODUODENOSCOPY (EGD) WITH PROPOFOL;  Surgeon: Lin Landsman, MD;  Location: [facility];  Service: Gastroenterology;  Laterality: N/A;   HERNIA REPAIR  1990   bilateral inguinal   PUBOVAGINAL SLING N/A 08/02/2018   Procedure: PUBO-VAGINAL SLING- TVT;  Surgeon: Homero Fellers, MD;  Location: [facility];  Service: Gynecology;  Laterality: N/A;   THYROIDECTOMY  2007   thyroid cancer   TOTAL ABDOMINAL HYSTERECTOMY  2008   dymenorrhea    Home Medications:  Allergies as of 08/23/2022       Reactions   Darvon [propoxyphene] Other (See Comments)   Hallucinations   Onion Other (See Comments)   Migraines   Other    popcorn   Percocet [oxycodone-acetaminophen] Hives   Vicodin [hydrocodone-acetaminophen] Hives        Medication List        Accurate as of August 23, 2022 12:45 PM. If you have any questions, ask your nurse or doctor.           ALLEGRA ALLERGY PO Take 1 tablet by mouth daily.   baclofen 10 MG tablet Commonly known as: LIORESAL as needed.   Cod Liver Oil Caps Take 1 capsule by mouth at bedtime.   diclofenac Sodium 1 % Gel Commonly known as: VOLTAREN Apply 2 g topically 4 (four) times daily.   Emgality 120 MG/ML Soaj Generic drug: Galcanezumab-gnlm Inject into the skin every 30 (thirty) days.   FISH OIL PO Take by mouth.   Synthroid 88 MCG tablet Generic drug: levothyroxine Take 1 tablet (88 mcg total) by mouth daily before breakfast.   Vitamin D3 50 MCG (2000 UT) capsule Take 2,000 Units by mouth daily.   Xhance 93 MCG/ACT Exhu Generic drug: Fluticasone Propionate Place 1-2 puffs into the nose daily as needed.   ZyrTEC Allergy 10 MG tablet Generic drug: cetirizine Take 10 mg by mouth.        Allergies:  Allergies  Allergen Reactions   Darvon [Propoxyphene] Other (See Comments)    Hallucinations    Onion Other (See Comments)    Migraines   Other     popcorn   Percocet [Oxycodone-Acetaminophen] Hives   Vicodin [Hydrocodone-Acetaminophen] Hives    Family History: Family History  Problem Relation Age of Onset   Hyperlipidemia Father    Hypertension Father    Clotting disorder Sister    Hepatitis B Brother    Breast cancer Paternal Grandmother 58       with mets   Cervical cancer Mother        dx. in her early 34s   Breast cancer Paternal Aunt 28   Throat cancer Paternal Aunt 66   Breast cancer Paternal Aunt 39   Breast cancer Paternal Aunt        72   Skin cancer Paternal Aunt    Cancer Cousin        unknown type dx. in her late 20s/early 42s   Skin cancer Cousin        13 cancerous moles removed in her 24s    Social History:  reports that she has never smoked. She has never used smokeless tobacco. She reports that she does not currently use alcohol. She reports that she does not use drugs.   Physical Exam: BP 95/65   Pulse 84   Ht 5' (1.524 m)   Wt 130  lb (59 kg)   BMI 25.39 kg/m   Constitutional:  Alert and oriented, No acute distress. HEENT: [place] AT, moist mucus membranes.  Trachea midline, no masses. Cardiovascular: No clubbing, cyanosis, or edema. Respiratory: Normal respiratory effort, no increased work of breathing. Skin: No rashes, bruises or suspicious lesions. Neurologic: Grossly intact, no focal deficits, moving all 4 extremities. Psychiatric: Normal mood and affect.  Laboratory Data: Lab Results  Component Value Date   WBC 8.1 03/23/2022   HGB 12.6 03/23/2022   HCT 37.5 03/23/2022   MCV 88.9 03/23/2022   PLT 316.0 03/23/2022    Lab Results  Component Value Date   CREATININE 0.66 08/18/2022    Lab Results  Component Value Date   HGBA1C 5.7 08/18/2022    Urinalysis UA today with 11-30 red blood cells per high-power field, otherwise negative  Pertinent Imaging: I reviewed CT scan from this calendar year, unremarkable without GU pathology  Assessment & Plan:    1. Gross/ microscopic hematuria Discussed the differential diagnosis for blood in the urine  There is evidence of microscopic blood in her urine today and thus recommended hematuria evaluation.  She is fairly low risk given her age.  Will hold off on further imaging since she had a CT of the abdomen elevate without delayed earlier this year.  I recommended office cystoscopy to which she is agreeable.  Consider further follow-up imaging if she has recurrent episode of gross hematuria.   Return for UA Cysto.  Hollice Espy, MD  [facility] [place] [facility] [address], [place], [place] [address] [phone]

## 2022-08-23 NOTE — Progress Notes (Signed)
   Established Patient Office Visit  Subjective   Patient ID: Sarah Moran, female    DOB: 06-24-1981  Age: 41 y.o. MRN: [id number]  Chief Complaint  Patient presents with   Follow-up    HPI   41 yo P2 here to go over her ultrasound results. She has a h/o ovarian cysts and had an ultrasound ordered at her annual exam 05/2022. She doesn't have any symptoms today, no gyn complaints. She does have chronic constipation since having her thyroid removed due to thyroid cancer. She uses miralax daily.  Objective:     BP 103/67   Pulse 78   Wt 129 lb 4.8 oz (58.7 kg)   BMI 25.25 kg/m    Physical Exam   Results for orders placed or performed in visit on 08/23/22  Microscopic Examination   Urine  Result Value Ref Range   WBC, UA 0-5 0 - 5 /hpf   RBC, Urine 11-30 (A) 0 - 2 /hpf   Epithelial Cells (non renal) 0-10 0 - 10 /hpf   Bacteria, UA Moderate (A) None seen/Few  Urinalysis, Complete  Result Value Ref Range   Specific Gravity, UA <1.005 (L) 1.005 - 1.030   pH, UA 5.0 5.0 - 7.5   Color, UA Yellow Yellow   Appearance Ur Clear Clear   Leukocytes,UA Negative Negative   Protein,UA Negative Negative/Trace   Glucose, UA Negative Negative   Ketones, UA Negative Negative   RBC, UA 2+ (A) Negative   Bilirubin, UA Negative Negative   Urobilinogen, Ur 0.2 0.2 - 1.0 mg/dL   Nitrite, UA Negative Negative   Microscopic Examination See below:   Bladder Scan (Post Void Residual) in office  Result Value Ref Range   Scan Result 0ML       Well nourished, well hydrated White female, no apparent distress She is ambulating and conversing normally. Pelvic deferred   Assessment & Plan:  H/o ovarian cysts- We discussed the normal ovarian cycle and the presence of a normal ovulation cyst monthly.  Chronic constipation- I rec'd that she add magnesium citrate to her daily routine.  Problem List Items Addressed This Visit   None   No follow-ups on file.    Emily Filbert, MD

## 2022-08-23 NOTE — Progress Notes (Signed)
Office Visit Note   Patient: Sarah Moran           Date of Birth: 01/07/1981           MRN: [id number] Visit Date: 08/22/2022              Requested by: Gregor Hams, MD [place],  [place] [address] PCP: Leone Haven, MD  Chief Complaint  Patient presents with   Left Ankle - Pain      HPI: Patient is a 41 year old woman who is seen for initial evaluation for left ankle pain.  Patient has had an MRI scan which shows partial tearing of the peroneal tendons she also has had several injuries with an ankle sprain with pain over the anterior talofibular ligament.  MRI scan was obtained in August 2023.  Patient has had 2 falls with her ankle she has pain with hiking and complains of a popping sensation over the lateral aspect of her ankle.  She was in a fracture boot for a week and wore an ankle stabilizing orthosis for months without relief.  Assessment & Plan: Visit Diagnoses:  1. Subluxation of peroneal tendon of left foot   2. Sprain of anterior talofibular ligament of left ankle, initial encounter     Plan: Recommended proceeding with debridement of the peroneal tendons reinforcement of the peroneal tendon retinaculum to prevent subluxation of the peroneal tendons and repair of the anterior talofibular ligament.  Risks and benefits were discussed including persistent pain need for additional surgery numbness over the lateral aspect of her foot.  Patient states she understands would like to proceed after January 1.  Follow-Up Instructions: Return in about 2 weeks (around 09/05/2022).   Ortho Exam  Patient is alert, oriented, no adenopathy, well-dressed, normal affect, normal respiratory effort. Examination patient has a good palpable dorsalis pedis pulse she does have a positive anterior drawer on the left foot with a palpable clunk.  With resisted eversion of the foot patient sublux her peroneal tendons.  There is swelling over the peroneal tendons and  these are tender to palpation.  Patient has tenderness to palpation of the anterior talofibular ligament.  Review of the MRI scan shows an effusion of the ankle posteriorly and the subtalar joint with an effusion around the peroneal tendons.  No osteochondral defect of the tibial talar joint.  Imaging: No results found. No images are attached to the encounter.  Labs: Lab Results  Component Value Date   HGBA1C 5.7 08/18/2022   HGBA1C 5.9 07/27/2021   HGBA1C 5.7 06/19/2019   ESRSEDRATE 13 10/07/2019   ESRSEDRATE 9 04/23/2018   LABORGA NO GROWTH 07/13/2015     Lab Results  Component Value Date   ALBUMIN 4.1 08/18/2022   ALBUMIN 4.3 02/15/2022   ALBUMIN 3.5 12/23/2021    No results found for: "MG" Lab Results  Component Value Date   VD25OH 64.08 12/12/2019   VD25OH 54.80 12/12/2018    No results found for: "PREALBUMIN"    Latest Ref Rng & Units 03/23/2022    4:52 PM 03/07/2022    2:29 PM 02/15/2022   10:37 AM  CBC EXTENDED  WBC 4.0 - 10.5 K/uL 8.1  10.4  7.5   RBC 3.87 - 5.11 Mil/uL 4.22  4.41  4.53   Hemoglobin 12.0 - 15.0 g/dL 12.6  13.3  13.5   HCT 36.0 - 46.0 % 37.5  39.3  40.0   Platelets 150.0 - 400.0 K/uL 316.0  322.0  284.0   NEUT# 1.4 - 7.7 K/uL 5.6  9.8  4.8   Lymph# 0.7 - 4.0 K/uL 1.8  0.5  2.1      There is no height or weight on file to calculate BMI.  Orders:  No orders of the defined types were placed in this encounter.  No orders of the defined types were placed in this encounter.    Procedures: No procedures performed  Clinical Data: No additional findings.  ROS:  All other systems negative, except as noted in the HPI. Review of Systems  Objective: Vital Signs: There were no vitals taken for this visit.  Specialty Comments:  No specialty comments available.  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   Trapezius strain 11/06/2020   Sleeping difficulty 11/06/2020   Family history of skin cancer    Family history of cervical cancer    Family history of throat cancer    ANA positive 11/06/2019   Arthralgia 09/30/2019   Allergic rhinitis 05/06/2019   Calculus of gallbladder without cholecystitis without obstruction    Prolapse of female pelvic organs 08/02/2018   Rectocele 08/02/2018   Dyspepsia    Muscle ache 04/23/2018   Paresthesia 04/23/2018   Anxiety and depression 04/23/2018   Tension headache 04/23/2018   Hyperlipidemia 05/02/2017   Family history of breast cancer 12/15/2016   History of thyroid cancer 06/27/2014   Hypothyroidism 06/27/2014   Migraine 09/23/2013   Past Medical History:  Diagnosis Date   Allergy    Anal fissure    Anxiety    no meds   Chronic pelvic pain in female    Family history of breast cancer    My Risk neg 5/18   Family history of cervical cancer    Family history of skin cancer    Family history of throat cancer    Frequent headaches    Gallstone    Gallstones    Genetic testing of female 01/2017   My Risk/BRCA neg   GERD (gastroesophageal reflux disease)    well controlled. Takes gingerroot when symptoms   H. pylori infection 06/19/2018   Hypothyroidism    s/p thyroidectomy - levothyroxine 75 mcg daily   IBS (irritable bowel syndrome)    constipation - takes miralax daily   Increased risk of breast cancer 01/2017   IBIS=18.8%/riskscore=21.9%   Liver cyst 04/2018   Migraine    takes excedrin. She has  tried imitrex in the past    Personal history of radiation therapy    Seizure ([place]) 1997    x 1-age 54-after surgery for scoliosis   Thyroid cancer ([place]) 2007   s/p thyroidectomy    Family History  Problem Relation Age of Onset   Hyperlipidemia Father    Hypertension Father    Clotting disorder Sister    Hepatitis B Brother    Breast cancer Paternal Grandmother 44       with mets  Cervical cancer Mother        dx. in her early 18s   Breast cancer Paternal Aunt 80   Throat cancer Paternal Aunt 3   Breast cancer Paternal Aunt 25   Breast cancer Paternal Aunt        76   Skin cancer Paternal Aunt    Cancer Cousin        unknown type dx. in her late 20s/early 14s   Skin cancer Cousin        69 cancerous moles removed in her 58s    Past Surgical History:  Procedure Laterality Date   ANAL RECTAL MANOMETRY N/A 10/12/2018   Procedure: ANO RECTAL MANOMETRY;  Surgeon: Mauri Pole, MD;  Location: [facility];  Service: Endoscopy;  Laterality: N/A;   ANTERIOR AND POSTERIOR REPAIR WITH SACROSPINOUS FIXATION N/A 08/02/2018   Procedure: ANTERIOR AND POSTERIOR REPAIR;  Surgeon: Homero Fellers, MD;  Location: [facility];  Service: Gynecology;  Laterality: N/A;   BACK SURGERY  1997   scoliosis   CHOLECYSTECTOMY N/A 09/21/2018   Procedure: LAPAROSCOPIC CHOLECYSTECTOMY;  Surgeon: Fredirick Maudlin, MD;  Location: [facility];  Service: General;  Laterality: N/A;   COLONOSCOPY     ESOPHAGOGASTRODUODENOSCOPY (EGD) WITH PROPOFOL N/A 06/12/2018   Procedure: ESOPHAGOGASTRODUODENOSCOPY (EGD) WITH PROPOFOL;  Surgeon: Lin Landsman, MD;  Location: [facility];  Service: Gastroenterology;  Laterality: N/A;   HERNIA REPAIR  1990   bilateral inguinal   PUBOVAGINAL SLING N/A 08/02/2018   Procedure: PUBO-VAGINAL SLING- TVT;  Surgeon: Homero Fellers, MD;  Location: [facility];  Service: Gynecology;  Laterality: N/A;   THYROIDECTOMY  2007   thyroid cancer   TOTAL ABDOMINAL  HYSTERECTOMY  2008   dymenorrhea   Social History   Occupational History   Occupation: Stay at home mom  Tobacco Use   Smoking status: Never   Smokeless tobacco: Never  Vaping Use   Vaping Use: Never used  Substance and Sexual Activity   Alcohol use: Not Currently   Drug use: No   Sexual activity: Yes    Birth control/protection: Surgical    Comment: Hysterectomy

## 2022-08-23 NOTE — Patient Instructions (Signed)

## 2022-08-25 ENCOUNTER — Ambulatory Visit: Payer: BC Managed Care – PPO | Admitting: Urology

## 2022-08-25 ENCOUNTER — Encounter: Payer: Self-pay | Admitting: Urology

## 2022-08-25 VITALS — BP 92/64 | HR 72 | Ht 60.0 in | Wt 129.0 lb

## 2022-08-25 DIAGNOSIS — R310 Gross hematuria: Secondary | ICD-10-CM

## 2022-08-25 NOTE — Progress Notes (Signed)
   08/25/22  CC:  Chief Complaint  Patient presents with   Cysto    HPI: 41 year old female with nonspecific urinary symptoms and episode of possible gross hematuria presents today for cystoscopy.  Please see previous notes for details.  Blood pressure 92/64, pulse 72, height 5' (1.524 m), weight 129 lb (58.5 kg). NED. A&Ox3.   No respiratory distress   Abd soft, NT, ND Normal external genitalia with patent urethral meatus  Cystoscopy Procedure Note  Patient identification was confirmed, informed consent was obtained, and patient was prepped using Betadine solution.  Lidocaine jelly was administered per urethral meatus.    Procedure: - Flexible cystoscope introduced, without any difficulty.   - Thorough search of the bladder revealed:    normal urethral meatus    normal urothelium    no stones    no ulcers     no tumors    no urethral polyps    no trabeculation  - Ureteral orifices were normal in position and appearance.  Post-Procedure: - Patient tolerated the procedure well  Assessment/ Plan:  1. Gross hematuria Cystoscopy today is completely normal, reassuring  Hold off on further imaging for the time being  Plan to recheck her urine again next year, if there is no evidence of microscopic blood, will hold off on further imaging but if there is in fact abnormality, consider CT urogram.  She is agreeable this plan.  If she has episode of gross hematuria, she will return sooner.     Return in about 1 year (around 08/26/2023) for PA for a urinalysis.  Hollice Espy, MD

## 2022-08-25 NOTE — Patient Instructions (Signed)
Follow up in 1 year for UA

## 2022-09-01 DIAGNOSIS — G43719 Chronic migraine without aura, intractable, without status migrainosus: Secondary | ICD-10-CM | POA: Diagnosis not present

## 2022-09-01 DIAGNOSIS — M542 Cervicalgia: Secondary | ICD-10-CM | POA: Diagnosis not present

## 2022-09-21 DIAGNOSIS — G43119 Migraine with aura, intractable, without status migrainosus: Secondary | ICD-10-CM | POA: Diagnosis not present

## 2022-09-21 DIAGNOSIS — M542 Cervicalgia: Secondary | ICD-10-CM | POA: Diagnosis not present

## 2022-09-30 ENCOUNTER — Other Ambulatory Visit: Payer: Self-pay | Admitting: Allergy & Immunology

## 2022-10-20 DIAGNOSIS — G43719 Chronic migraine without aura, intractable, without status migrainosus: Secondary | ICD-10-CM | POA: Diagnosis not present

## 2022-10-20 DIAGNOSIS — M542 Cervicalgia: Secondary | ICD-10-CM | POA: Diagnosis not present

## 2022-11-10 ENCOUNTER — Encounter: Payer: Self-pay | Admitting: "Endocrinology

## 2022-11-11 ENCOUNTER — Encounter: Payer: Self-pay | Admitting: Family Medicine

## 2022-11-11 ENCOUNTER — Other Ambulatory Visit: Payer: Self-pay

## 2022-11-11 DIAGNOSIS — Z1231 Encounter for screening mammogram for malignant neoplasm of breast: Secondary | ICD-10-CM

## 2022-11-11 DIAGNOSIS — E890 Postprocedural hypothyroidism: Secondary | ICD-10-CM | POA: Diagnosis not present

## 2022-11-16 ENCOUNTER — Encounter: Payer: Self-pay | Admitting: Allergy & Immunology

## 2022-11-16 ENCOUNTER — Ambulatory Visit: Payer: BC Managed Care – PPO | Admitting: Allergy & Immunology

## 2022-11-16 ENCOUNTER — Other Ambulatory Visit: Payer: Self-pay

## 2022-11-16 VITALS — BP 100/62 | HR 98 | Temp 97.8°F | Resp 20 | Ht 60.0 in | Wt 132.6 lb

## 2022-11-16 DIAGNOSIS — G43809 Other migraine, not intractable, without status migrainosus: Secondary | ICD-10-CM | POA: Diagnosis not present

## 2022-11-16 DIAGNOSIS — J3089 Other allergic rhinitis: Secondary | ICD-10-CM | POA: Diagnosis not present

## 2022-11-16 DIAGNOSIS — J302 Other seasonal allergic rhinitis: Secondary | ICD-10-CM

## 2022-11-16 NOTE — Patient Instructions (Addendum)
1. Seasonal and perennial allergic rhinitis - with overlying migraines (grasses, ragweed, weeds, trees, indoor molds, outdoor molds, cat, and dog) - We are going to make some changes to see how you do with those.  - Continue taking: Allegra one tablet in the morning and Xyzal at night - Continue taking: Zyrtec one tablet at night - You can use an extra dose of the antihistamine, if needed, for breakthrough symptoms.  - Consider nasal saline rinses 1-2 times daily to remove allergens from the nasal cavities as well as help with mucous clearance (this is especially helpful to do before the nasal sprays are given) - Allergy shot consent signed today.  - CPT codes provided below (you would need two injections and two vials). - Consider rush immunotherapy (to get you up to maintenance quicker).   2. Vertigo - Continue to follow with the headache doctor.   3. Return in about 6 months (around 05/17/2023).    Please inform us of any Emergency Department visits, hospitalizations, or changes in symptoms. Call us before going to the ED for breathing or allergy symptoms since we might be able to fit you in for a sick visit. Feel free to contact us anytime with any questions, problems, or concerns.  It was a pleasure to see you again today!  Websites that have reliable patient information: 1. American Academy of Asthma, Allergy, and Immunology: www.aaaai.org 2. Food Allergy Research and Education (FARE): foodallergy.org 3. Mothers of Asthmatics: http://www.asthmacommunitynetwork.org 4. American College of Allergy, Asthma, and Immunology: www.acaai.org   COVID-19 Vaccine Information can be found at: [email or web address].[email or web address].uk For questions related to vaccine distribution or appointments, please email [email or web address] or call [phone].   We realize that you might be concerned about having an allergic reaction to the COVID19 vaccines. To help  with that concern, WE ARE OFFERING THE COVID19 VACCINES IN OUR OFFICE! Ask the front desk for dates!     "Like" [place] on Facebook and Instagram for our latest updates!      A healthy democracy works best when [employer] participate! Make sure you are registered to vote! If you have moved or changed any of your contact information, you will need to get this updated before voting!  In some cases, you MAY be able to register to vote online: [email or web address]     Allergy Shots  Allergies are the result of a chain reaction that starts in the immune system. Your immune system controls how your body defends itself. For instance, if you have an allergy to pollen, your immune system identifies pollen as an invader or allergen. Your immune system overreacts by producing antibodies called Immunoglobulin E (IgE). These antibodies travel to cells that release chemicals, causing an allergic reaction.  The concept behind allergy immunotherapy, whether it is received in the form of shots or tablets, is that the immune system can be desensitized to specific allergens that trigger allergy symptoms. Although it requires time and patience, the payback can be long-term relief. Allergy injections contain a dilute solution of those substances that you are allergic to based upon your skin testing and allergy history.   How Do Allergy Shots Work?  Allergy shots work much like a vaccine. Your body responds to injected amounts of a particular allergen given in increasing doses, eventually developing a resistance and tolerance to it. Allergy shots can lead to decreased, minimal or no allergy symptoms.  There generally are two phases: build-up and maintenance. Build-up often ranges from three to six months and involves receiving  injections with increasing amounts of the allergens. The shots are typically given once or twice a week, though more rapid build-up schedules are sometimes  used.  The maintenance phase begins when the most effective dose is reached. This dose is different for each person, depending on how allergic you are and your response to the build-up injections. Once the maintenance dose is reached, there are longer periods between injections, typically two to four weeks.  Occasionally doctors give cortisone-type shots that can temporarily reduce allergy symptoms. These types of shots are different and should not be confused with allergy immunotherapy shots.  Who Can Be Treated with Allergy Shots?  Allergy shots may be a good treatment approach for people with allergic rhinitis (hay fever), allergic asthma, conjunctivitis (eye allergy) or stinging insect allergy.   Before deciding to begin allergy shots, you should consider:   The length of allergy season and the severity of your symptoms  Whether medications and/or changes to your environment can control your symptoms  Your desire to avoid long-term medication use  Time: allergy immunotherapy requires a major time commitment  Cost: may vary depending on your insurance coverage  Allergy shots for children age 37 and older are effective and often well tolerated. They might prevent the onset of new allergen sensitivities or the progression to asthma.  Allergy shots are not started on patients who are pregnant but can be continued on patients who become pregnant while receiving them. In some patients with other medical conditions or who take certain common medications, allergy shots may be of risk. It is important to mention other medications you talk to your allergist.   What are the two types of build-ups offered:   RUSH or Rapid Desensitization -- one day of injections lasting from 8:30-4:30pm, injections every 1 hour.  Approximately half of the build-up process is completed in that one day.  The following week, normal build-up is resumed, and this entails ~16 visits either weekly or twice weekly, until  reaching your "maintenance dose" which is continued weekly until eventually getting spaced out to every month for a duration of 3 to 5 years. The regular build-up appointments are nurse visits where the injections are administered, followed by required monitoring for 30 minutes.    Traditional build-up -- weekly visits for 6 -12 months until reaching "maintenance dose", then continue weekly until eventually spacing out to every 4 weeks as above. At these appointments, the injections are administered, followed by required monitoring for 30 minutes.     Either way is acceptable, and both are equally effective. With the rush protocol, the advantage is that less time is spent here for injections overall AND you would also reach maintenance dosing faster (which is when the clinical benefit starts to become more apparent). Not everyone is a candidate for rapid desensitization.   IF we proceed with the RUSH protocol, there are premedications which must be taken the day before and the day after the rush only (this includes antihistamines, steroids, and Singulair).  After the rush day, no prednisone or Singulair is required, and we just recommend antihistamines taken on your injection day.  What Is An Estimate of the Costs?  If you are interested in starting allergy injections, please check with your insurance company about your coverage for both allergy vial sets and allergy injections.  Please do so prior to making the appointment to start injections.  The following are CPT codes to give to your insurance company. These are the amounts we BILL  to [employer], but the amount YOU WILL PAY and [place] and depends on the contracts we have with different insurance companies.   Amount Billed to Insurance One allergy vial set  CPT 95165   $ 1200     Two allergy vial set  CPT 95165   $ 2400     Three allergy vial set  CPT 95165   $ 3600     One injection   CPT 95115   $ 35  Two  injections   CPT 95117   $ 40 RUSH (Rapid Desensitization) CPT 95180 x 8 hours $500/hour  Regarding the allergy injections, your co-pay may or may not apply with each injection, so please confirm this with your insurance company. When you start allergy injections, 1 or 2 sets of vials are made based on your allergies.  Not all patients can be on one set of vials. A set of vials lasts 6 months to a year depending on how quickly you can proceed with your build-up of your allergy injections. Vials are personalized for each patient depending on their specific allergens.  How often are allergy injection given during the build-up period?   Injections are given at least weekly during the build-up period until your maintenance dose is achieved. Per the doctor's discretion, you may have the option of getting allergy injections two times per week during the build-up period. However, there must be at least 48 hours between injections. The build-up period is usually completed within 6-12 months depending on your ability to schedule injections and for adjustments for reactions. When maintenance dose is reached, your injection schedule is gradually changed to every two weeks and later to every three weeks. Injections will then continue every 4 weeks. Usually, injections are continued for a total of 3-5 years.   When Will I Feel Better?  Some may experience decreased allergy symptoms during the build-up phase. For others, it may take as long as 12 months on the maintenance dose. If there is no improvement after a year of maintenance, your allergist will discuss other treatment options with you.  If you aren't responding to allergy shots, it may be because there is not enough dose of the allergen in your vaccine or there are missing allergens that were not identified during your allergy testing. Other reasons could be that there are high levels of the allergen in your environment or major exposure to non-allergic  triggers like tobacco smoke.  What Is the Length of Treatment?  Once the maintenance dose is reached, allergy shots are generally continued for three to five years. The decision to stop should be discussed with your allergist at that time. Some people may experience a permanent reduction of allergy symptoms. Others may relapse and a longer course of allergy shots can be considered.  What Are the Possible Reactions?  The two types of adverse reactions that can occur with allergy shots are local and systemic. Common local reactions include very mild redness and swelling at the injection site, which can happen immediately or several hours after. Report a delayed reaction from your last injection. These include arm swelling or runny nose, watery eyes or cough that occurs within 12-24 hours after injection. A systemic reaction, which is less common, affects the entire body or a particular body system. They are usually mild and typically respond quickly to medications. Signs include increased allergy symptoms such as sneezing, a stuffy nose or hives.   Rarely, a serious  systemic reaction called anaphylaxis can develop. Symptoms include swelling in the throat, wheezing, a feeling of tightness in the chest, nausea or dizziness. Most serious systemic reactions develop within 30 minutes of allergy shots. This is why it is strongly recommended you wait in your doctor's office for 30 minutes after your injections. Your allergist is trained to watch for reactions, and his or her staff is trained and equipped with the proper medications to identify and treat them.   Report to the nurse immediately if you experience any of the following symptoms: swelling, itching or redness of the skin, hives, watery eyes/nose, breathing difficulty, excessive sneezing, coughing, stomach pain, diarrhea, or light headedness. These symptoms may occur within 15-20 minutes after injection and may require medication.   Who Should  Administer Allergy Shots?  The preferred location for receiving shots is your prescribing allergist's office. Injections can sometimes be given at another facility where the physician and staff are trained to recognize and treat reactions, and have received instructions by your prescribing allergist.  What if I am late for an injection?   Injection dose will be adjusted depending upon how many days or weeks you are late for your injection.   What if I am sick?   Please report any illness to the nurse before receiving injections. She may adjust your dose or postpone injections depending on your symptoms. If you have fever, flu, sinus infection or chest congestion it is best to postpone allergy injections until you are better. Never get an allergy injection if your asthma is causing you problems. If your symptoms persist, seek out medical care to get your health problem under control.  What If I am or Become Pregnant:  Women that become pregnant should schedule an appointment with [facility] before receiving any further allergy injections.

## 2022-11-16 NOTE — Progress Notes (Unsigned)
FOLLOW UP  Date of Service/Encounter:  11/16/22   Assessment:   Seasonal and perennial allergic rhinitis (grasses, ragweed, weeds, trees, indoor molds, outdoor molds, cat, and dog)   Chronic migraines - now receiving injections for this  Mouth breakouts over the mouth - with negative testing to popcorn   Plan/Recommendations:   1. Seasonal and perennial allergic rhinitis - with overlying migraines (grasses, ragweed, weeds, trees, indoor molds, outdoor molds, cat, and dog) - We are going to make some changes to see how you do with those.  - Continue taking: Allegra one tablet in the morning and Xyzal at night - Continue taking: Zyrtec one tablet at night - You can use an extra dose of the antihistamine, if needed, for breakthrough symptoms.  - Consider nasal saline rinses 1-2 times daily to remove allergens from the nasal cavities as well as help with mucous clearance (this is especially helpful to do before the nasal sprays are given) - Allergy shot consent signed today.  - CPT codes provided below (you would need two injections and two vials). - Consider rush immunotherapy (to get you up to maintenance quicker).   2. Vertigo - Continue to follow with the headache doctor.   3. Return in about 6 months (around 05/17/2023).     Subjective:   Sarah Moran is a 42 y.o. female presenting today for follow up of  Chief Complaint  Patient presents with   Follow-up    Pt states she wants to talk about getting on shots.    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   Trapezius strain 11/06/2020   Sleeping difficulty 11/06/2020   Family history of skin cancer    Family history of cervical cancer    Family history of throat cancer    ANA positive 11/06/2019   Arthralgia 09/30/2019   Allergic rhinitis 05/06/2019   Calculus of gallbladder without cholecystitis without obstruction    Prolapse of female pelvic organs 08/02/2018   Rectocele 08/02/2018   Dyspepsia    Muscle ache 04/23/2018   Paresthesia 04/23/2018   Anxiety and depression 04/23/2018   Tension headache 04/23/2018   Hyperlipidemia 05/02/2017   Family history of breast cancer 12/15/2016   History of thyroid cancer 06/27/2014   Hypothyroidism 06/27/2014   Migraine 09/23/2013    History obtained from: chart review and patient.  Sarah Moran is a 42 y.o. female presenting for a follow up visit. She was last seen in August 2023. At that time, Dr. Nelva Bush continue with levocetirizine '5mg'$  daily as well as [name]. She continues to avoid popcorn. IgE was sent at the last visit and this was negative.   Since the last visit, she has not done well. She has been having a lot of nosebleeds. The [name] does take away her headaches. She started getting vertigo in June 2022 and the [name] has made it worse. She is taking the injections for  her migraines. She is taking three pills a day and this does take the edge off. She is taking Allegra and Xyzal in the morning and Zyrtec at night.   She lives here around 20-25 minutes away. She is finally willing to try using the allergy shots. She is wondering if she can get the billing information so that she can talk to her insurance company about coverage.   She no longer follows with Dr. Benjamine Mola.  He apparently just referred her to her current headache doctor who she continues to follow.   Her vertigo has not really changed much.  It is thought to be a manifestation of a migraine.  She  She recently ate a hot dog and her mouth was broken out for several days.  She had previously eaten hotdogs without any problems.  She is not sure what was in it that made her break out.  She has an autoimmune disorder that was managed by her PCP, but he "quit managing that" for whatever reason.  She does have a history of hypothyroidism and sees Dr. Dorris Fetch.  Otherwise, there have been no changes to her past medical history, surgical history, family history, or social history.    Review of Systems  Constitutional: Negative.  Negative for chills, fever, malaise/fatigue and weight loss.  HENT:  Positive for congestion. Negative for ear discharge, ear pain and sinus pain.   Eyes:  Negative for pain, discharge and redness.  Respiratory:  Negative for cough, sputum production, shortness of breath and wheezing.   Cardiovascular: Negative.  Negative for chest pain and palpitations.  Gastrointestinal:  Negative for abdominal pain, constipation, diarrhea, heartburn, nausea and vomiting.  Skin: Negative.  Negative for itching and rash.  Neurological:  Positive for headaches. Negative for dizziness.  Endo/Heme/Allergies:  Positive for environmental allergies. Does not bruise/bleed easily.       Objective:   Blood pressure 100/62, pulse 98, temperature 97.8 F (36.6 C), resp. rate 20, height 5' (1.524 m), weight 132 lb 9.6 oz (60.1 kg), SpO2 98 %. Body mass index is 25.9 kg/m.    Physical Exam Vitals reviewed.  Constitutional:      Appearance: She is well-developed.     Comments: Pleasant.  HENT:     Head: Normocephalic and atraumatic.     Right Ear: Tympanic membrane, ear canal and external ear normal.     Left Ear: Tympanic membrane, ear canal and external ear normal.     Nose: No nasal deformity, septal deviation, mucosal edema or rhinorrhea.     Right Turbinates: Enlarged, swollen and  pale.     Left Turbinates: Enlarged, swollen and pale.     Right Sinus: No maxillary sinus tenderness or frontal sinus tenderness.     Left Sinus: No maxillary sinus tenderness or frontal sinus tenderness.     Comments: No polyps.    Mouth/Throat:     Mouth: Mucous membranes are not pale and not dry.     Pharynx: Uvula midline.     Comments: Moderate cobblestoning. Eyes:     General: Lids are normal. Allergic shiner present.        Right eye: No discharge.        Left eye: No discharge.     Conjunctiva/sclera: Conjunctivae normal.     Right eye: Right conjunctiva is not injected. No chemosis.    Left eye: Left conjunctiva is not injected. No chemosis.    Pupils: Pupils are equal, round, and reactive to light.  Cardiovascular:  Rate and Rhythm: Normal rate and regular rhythm.     Heart sounds: Normal heart sounds.  Pulmonary:     Effort: Pulmonary effort is normal. No tachypnea, accessory muscle usage or respiratory distress.     Breath sounds: Normal breath sounds. No wheezing, rhonchi or rales.     Comments: Moving air well in all lung fields.  No increased work of breathing. Chest:     Chest wall: No tenderness.  Lymphadenopathy:     Cervical: No cervical adenopathy.  Skin:    General: Skin is warm.     Capillary Refill: Capillary refill takes less than 2 seconds.     Coloration: Skin is not pale.     Findings: No abrasion, erythema, petechiae or rash. Rash is not papular, urticarial or vesicular.     Comments: No eczematous or urticarial lesions noted.  Neurological:     Mental Status: She is alert.  Psychiatric:        Behavior: Behavior is cooperative.      Diagnostic studies: none       Salvatore Marvel, MD  [facility]

## 2022-11-17 DIAGNOSIS — G43719 Chronic migraine without aura, intractable, without status migrainosus: Secondary | ICD-10-CM | POA: Diagnosis not present

## 2022-11-17 DIAGNOSIS — M542 Cervicalgia: Secondary | ICD-10-CM | POA: Diagnosis not present

## 2022-11-20 LAB — THYROGLOBULIN LEVEL: Thyroglobulin (TG-RIA): <2 ng/mL

## 2022-11-20 LAB — TSH: TSH: 0.287 u[IU]/mL — ABNORMAL LOW (ref 0.450–4.500)

## 2022-11-20 LAB — T3, FREE: T3, Free: 2.6 pg/mL (ref 2.0–4.4)

## 2022-11-20 LAB — T4, FREE: Free T4: 1.57 ng/dL (ref 0.82–1.77)

## 2022-11-22 ENCOUNTER — Telehealth: Payer: Self-pay | Admitting: *Deleted

## 2022-11-22 DIAGNOSIS — J302 Other seasonal allergic rhinitis: Secondary | ICD-10-CM

## 2022-11-22 NOTE — Telephone Encounter (Signed)
Sarah Shaggy, MD  [facility] Can someone call to see whether she has decided about starting shots and if she was interested in rush immunotherapy.   Called and left a voicemail asking for patient to return call to discuss.

## 2022-11-23 ENCOUNTER — Other Ambulatory Visit: Payer: Self-pay | Admitting: *Deleted

## 2022-11-23 MED ORDER — EPINEPHRINE 0.3 MG/0.3ML IJ SOAJ: 0.3000 mg | INTRAMUSCULAR | 1 refills | Status: DC | PRN

## 2022-11-23 NOTE — Addendum Note (Signed)
Addended by: Valentina Shaggy on: 11/23/2022 01:39 PM   Modules accepted: Orders

## 2022-11-23 NOTE — Telephone Encounter (Signed)
Patient called back and stated that she would like to start allergy injections, but not RUSH, I have scheduled for her to start allergy injections in [place] and sent in a prescription for EpiPen to the requested pharmacy. Patient verbalized understanding.

## 2022-11-23 NOTE — Telephone Encounter (Signed)
Routing to immunotherapy team.   Salvatore Marvel, MD [facility]

## 2022-11-28 DIAGNOSIS — J301 Allergic rhinitis due to pollen: Secondary | ICD-10-CM | POA: Diagnosis not present

## 2022-11-28 NOTE — Progress Notes (Signed)
Aeroallergen Immunotherapy  Ordering Provider: Dr. Salvatore Marvel  Patient Details Name: ANNALI VERGEL MRN: [id number] Date of Birth: Apr 29, 1981  Order 1 of 2  Vial Label: G/W/T/C/D  0.3 ml (Volume)  BAU Concentration -- 7 Grass Mix* 100,000 ([address] [place], [place], [place], [place], [place], Sweet Vernal, Timothy) 0.3 ml (Volume)  BAU Concentration -- [place] 10,000 0.2 ml (Volume)  1:20 Concentration -- [place] 0.5 ml (Volume)  1:20 Concentration -- Weed Mix* 0.5 ml (Volume)  1:20 Concentration -- Eastern 10 Tree Mix (also Sweet Gum) 0.5 ml (Volume)  1:10 Concentration -- Cat Hair 0.5 ml (Volume)  1:10 Concentration -- Dog Epithelia   2.8  ml Extract Subtotal 2.2  ml Diluent 5.0  ml Maintenance Total  Schedule:  A  Blue Vial (1:100,000): Schedule A (10 doses) Yellow Vial (1:10,000): Schedule A (10 doses) Green Vial (1:1,000): Schedule A (10 doses) Red Vial (1:100): Schedule A (14 doses)  Special Instructions: ok to come twice weekly, if desired

## 2022-11-28 NOTE — Progress Notes (Signed)
Aeroallergen Immunotherapy  Ordering Provider: Dr. Salvatore Marvel  Patient Details Name: Sarah Moran MRN: [id number] Date of Birth: 1981/04/25  Order 2 of 2  Vial Label: RW/Molds  0.3 ml (Volume)  1:20 Concentration -- Ragweed Mix 0.2 ml (Volume)  1:20 Concentration -- Alternaria alternata 0.2 ml (Volume)  1:20 Concentration -- Cladosporium herbarum 0.2 ml (Volume)  1:20 Concentration -- Bipolaris sorokiniana 0.2 ml (Volume)  1:20 Concentration -- Drechslera spicifera 0.2 ml (Volume)  1:10 Concentration -- Mucor plumbeus 0.2 ml (Volume)  1:10 Concentration -- Fusarium moniliforme 0.2 ml (Volume)  1:40 Concentration -- Aureobasidium pullulans 0.2 ml (Volume)  1:10 Concentration -- Rhizopus oryzae   1.9  ml Extract Subtotal 3.1  ml Diluent 5.0  ml Maintenance Total  Schedule:  A   Blue Vial (1:100,000): Schedule A (10 doses) Yellow Vial (1:10,000): Schedule A (10 doses) Green Vial (1:1,000): Schedule A (10 doses) Red Vial (1:100): Schedule A (14 doses)  Special Instructions: ok to come twice weekly, if desired.

## 2022-11-28 NOTE — Progress Notes (Signed)
EXP 11/28/23

## 2022-11-29 DIAGNOSIS — J3081 Allergic rhinitis due to animal (cat) (dog) hair and dander: Secondary | ICD-10-CM | POA: Diagnosis not present

## 2022-12-08 DIAGNOSIS — K121 Other forms of stomatitis: Secondary | ICD-10-CM | POA: Diagnosis not present

## 2022-12-08 DIAGNOSIS — B001 Herpesviral vesicular dermatitis: Secondary | ICD-10-CM | POA: Diagnosis not present

## 2022-12-14 ENCOUNTER — Ambulatory Visit (INDEPENDENT_AMBULATORY_CARE_PROVIDER_SITE_OTHER): Payer: BC Managed Care – PPO

## 2022-12-14 DIAGNOSIS — J309 Allergic rhinitis, unspecified: Secondary | ICD-10-CM

## 2022-12-14 NOTE — Progress Notes (Signed)
Immunotherapy   Patient Details  Name: Sarah Moran MRN: [id number] Date of Birth: October 30, 1980  12/14/2022  Sarah Moran started injections for  ragweed's, molds, grasses, weeds, trees, cats, and dogs.  Following schedule: A  Frequency:2 times per week Epi-Pen:Epi-Pen Available  Consent signed previously and patient instructions given. Patient waited in the lobby for about twenty minutes then complained of face tingling and was sent to room one for more evaluation. Patient expressed she has had issues with migraines.    Sarah Moran 12/14/2022, 6:20 PM

## 2022-12-14 NOTE — Progress Notes (Signed)
Here for shot visit today.  Initial shot and reported having funny feeling in her L jaw.  No other symptoms of SOB, wheezing, coughing, hives, itching, abdominal pain, diarrhea, vomiting.  Does report having migraines that flare up with allergic rhinitis and presents in similar way.  We watched her for 30 minutes and she was fine at discharge.  Vitals stable.  Will continue with regular AIT dosing schedule.

## 2022-12-15 DIAGNOSIS — G43719 Chronic migraine without aura, intractable, without status migrainosus: Secondary | ICD-10-CM | POA: Diagnosis not present

## 2022-12-15 DIAGNOSIS — M542 Cervicalgia: Secondary | ICD-10-CM | POA: Diagnosis not present

## 2022-12-21 ENCOUNTER — Ambulatory Visit (INDEPENDENT_AMBULATORY_CARE_PROVIDER_SITE_OTHER): Payer: BC Managed Care – PPO

## 2022-12-21 DIAGNOSIS — J309 Allergic rhinitis, unspecified: Secondary | ICD-10-CM | POA: Diagnosis not present

## 2022-12-23 ENCOUNTER — Ambulatory Visit (INDEPENDENT_AMBULATORY_CARE_PROVIDER_SITE_OTHER): Payer: BC Managed Care – PPO

## 2022-12-23 DIAGNOSIS — J309 Allergic rhinitis, unspecified: Secondary | ICD-10-CM | POA: Diagnosis not present

## 2022-12-28 ENCOUNTER — Ambulatory Visit (INDEPENDENT_AMBULATORY_CARE_PROVIDER_SITE_OTHER): Payer: BC Managed Care – PPO

## 2022-12-28 DIAGNOSIS — J309 Allergic rhinitis, unspecified: Secondary | ICD-10-CM

## 2022-12-30 ENCOUNTER — Ambulatory Visit (INDEPENDENT_AMBULATORY_CARE_PROVIDER_SITE_OTHER): Payer: BC Managed Care – PPO

## 2022-12-30 DIAGNOSIS — J309 Allergic rhinitis, unspecified: Secondary | ICD-10-CM | POA: Diagnosis not present

## 2023-01-01 ENCOUNTER — Other Ambulatory Visit: Payer: Self-pay | Admitting: Allergy

## 2023-01-04 ENCOUNTER — Ambulatory Visit (INDEPENDENT_AMBULATORY_CARE_PROVIDER_SITE_OTHER): Payer: BC Managed Care – PPO

## 2023-01-04 DIAGNOSIS — J309 Allergic rhinitis, unspecified: Secondary | ICD-10-CM | POA: Diagnosis not present

## 2023-01-06 ENCOUNTER — Ambulatory Visit (INDEPENDENT_AMBULATORY_CARE_PROVIDER_SITE_OTHER): Payer: BC Managed Care – PPO

## 2023-01-06 DIAGNOSIS — J309 Allergic rhinitis, unspecified: Secondary | ICD-10-CM | POA: Diagnosis not present

## 2023-01-11 ENCOUNTER — Ambulatory Visit (INDEPENDENT_AMBULATORY_CARE_PROVIDER_SITE_OTHER): Payer: BC Managed Care – PPO

## 2023-01-11 DIAGNOSIS — M542 Cervicalgia: Secondary | ICD-10-CM | POA: Diagnosis not present

## 2023-01-11 DIAGNOSIS — J309 Allergic rhinitis, unspecified: Secondary | ICD-10-CM

## 2023-01-11 DIAGNOSIS — G43719 Chronic migraine without aura, intractable, without status migrainosus: Secondary | ICD-10-CM | POA: Diagnosis not present

## 2023-01-13 ENCOUNTER — Ambulatory Visit (INDEPENDENT_AMBULATORY_CARE_PROVIDER_SITE_OTHER): Payer: BC Managed Care – PPO

## 2023-01-13 DIAGNOSIS — J309 Allergic rhinitis, unspecified: Secondary | ICD-10-CM | POA: Diagnosis not present

## 2023-01-17 DIAGNOSIS — J305 Allergic rhinitis due to food: Secondary | ICD-10-CM | POA: Diagnosis not present

## 2023-01-17 DIAGNOSIS — T7840XA Allergy, unspecified, initial encounter: Secondary | ICD-10-CM | POA: Diagnosis not present

## 2023-01-18 ENCOUNTER — Ambulatory Visit (INDEPENDENT_AMBULATORY_CARE_PROVIDER_SITE_OTHER): Payer: BC Managed Care – PPO

## 2023-01-18 DIAGNOSIS — J309 Allergic rhinitis, unspecified: Secondary | ICD-10-CM | POA: Diagnosis not present

## 2023-01-20 ENCOUNTER — Ambulatory Visit (INDEPENDENT_AMBULATORY_CARE_PROVIDER_SITE_OTHER): Payer: BC Managed Care – PPO

## 2023-01-20 DIAGNOSIS — J309 Allergic rhinitis, unspecified: Secondary | ICD-10-CM

## 2023-01-23 DIAGNOSIS — E039 Hypothyroidism, unspecified: Secondary | ICD-10-CM | POA: Diagnosis not present

## 2023-01-23 DIAGNOSIS — Z85850 Personal history of malignant neoplasm of thyroid: Secondary | ICD-10-CM | POA: Diagnosis not present

## 2023-01-25 ENCOUNTER — Ambulatory Visit (INDEPENDENT_AMBULATORY_CARE_PROVIDER_SITE_OTHER): Payer: BC Managed Care – PPO

## 2023-01-25 DIAGNOSIS — J309 Allergic rhinitis, unspecified: Secondary | ICD-10-CM

## 2023-01-27 ENCOUNTER — Ambulatory Visit (INDEPENDENT_AMBULATORY_CARE_PROVIDER_SITE_OTHER): Payer: BC Managed Care – PPO

## 2023-01-27 DIAGNOSIS — J309 Allergic rhinitis, unspecified: Secondary | ICD-10-CM

## 2023-01-30 LAB — THYROGLOBULIN LEVEL: Thyroglobulin (TG-RIA): <2 ng/mL

## 2023-01-30 LAB — T4, FREE: Free T4: 1.68 ng/dL (ref 0.82–1.77)

## 2023-01-30 LAB — TSH: TSH: 0.489 u[IU]/mL (ref 0.450–4.500)

## 2023-02-01 ENCOUNTER — Encounter: Payer: Self-pay | Admitting: "Endocrinology

## 2023-02-01 ENCOUNTER — Ambulatory Visit: Payer: BC Managed Care – PPO | Admitting: "Endocrinology

## 2023-02-01 ENCOUNTER — Ambulatory Visit (INDEPENDENT_AMBULATORY_CARE_PROVIDER_SITE_OTHER): Payer: BC Managed Care – PPO

## 2023-02-01 VITALS — BP 96/62 | HR 64 | Ht 60.0 in | Wt 135.0 lb

## 2023-02-01 DIAGNOSIS — J309 Allergic rhinitis, unspecified: Secondary | ICD-10-CM

## 2023-02-01 DIAGNOSIS — E890 Postprocedural hypothyroidism: Secondary | ICD-10-CM

## 2023-02-01 MED ORDER — SYNTHROID 88 MCG PO TABS: 88.0000 ug | ORAL_TABLET | Freq: Every day | ORAL | 1 refills | Status: DC

## 2023-02-01 NOTE — Progress Notes (Signed)
02/01/2023, 5:23 PM   Endocrinology follow-up note  Subjective:    Patient ID: Sarah Moran, female    DOB: 1980/11/01, PCP Glori Luis, MD   Past Medical History:  Diagnosis Date   Allergy    Anal fissure    Anxiety    no meds   Chronic pelvic pain in female    Family history of breast cancer    My Risk neg 5/18   Family history of cervical cancer    Family history of skin cancer    Family history of throat cancer    Frequent headaches    Gallstone    Gallstones    Genetic testing of female 01/2017   My Risk/BRCA neg   GERD (gastroesophageal reflux disease)    well controlled. Takes gingerroot when symptoms   H. pylori infection 06/19/2018   Hypothyroidism    s/p thyroidectomy - levothyroxine 75 mcg daily   IBS (irritable bowel syndrome)    constipation - takes miralax daily   Increased risk of breast cancer 01/2017   IBIS=18.8%/riskscore=21.9%   Liver cyst 04/2018   Migraine    takes excedrin. She has tried imitrex in the past    Personal history of radiation therapy    Seizure (HCC) 1997    x 1-age 80-after surgery for scoliosis   Thyroid cancer (HCC) 2007   s/p thyroidectomy   Past Surgical History:  Procedure Laterality Date   ANAL RECTAL MANOMETRY N/A 10/12/2018   Procedure: ANO RECTAL MANOMETRY;  Surgeon: Napoleon Form, MD;  Location: [facility];  Service: Endoscopy;  Laterality: N/A;   ANTERIOR AND POSTERIOR REPAIR WITH SACROSPINOUS FIXATION N/A 08/02/2018   Procedure: ANTERIOR AND POSTERIOR REPAIR;  Surgeon: Natale Milch, MD;  Location: [facility];  Service: Gynecology;  Laterality: N/A;   BACK SURGERY  1997   scoliosis   CHOLECYSTECTOMY N/A 09/21/2018   Procedure: LAPAROSCOPIC CHOLECYSTECTOMY;  Surgeon: Duanne Guess, MD;  Location: [facility];  Service: General;  Laterality: N/A;   COLONOSCOPY     ESOPHAGOGASTRODUODENOSCOPY (EGD) WITH PROPOFOL N/A 06/12/2018   Procedure:  ESOPHAGOGASTRODUODENOSCOPY (EGD) WITH PROPOFOL;  Surgeon: Toney Reil, MD;  Location: [facility];  Service: Gastroenterology;  Laterality: N/A;   HERNIA REPAIR  1990   bilateral inguinal   PUBOVAGINAL SLING N/A 08/02/2018   Procedure: PUBO-VAGINAL SLING- TVT;  Surgeon: Natale Milch, MD;  Location: [facility];  Service: Gynecology;  Laterality: N/A;   THYROIDECTOMY  2007   thyroid cancer   TOTAL ABDOMINAL HYSTERECTOMY  2008   dymenorrhea   Social History   Socioeconomic History   Marital status: Married    Spouse name: [name]   Number of children: 2   Years of education: 12   Highest education level: Not on file  Occupational History   Occupation: Stay at home mom  Tobacco Use   Smoking status: Never   Smokeless tobacco: Never  Vaping Use   Vaping Use: Never used  Substance and Sexual Activity   Alcohol use: Not Currently   Drug use: No   Sexual activity: Yes    Birth control/protection: Surgical    Comment: Hysterectomy  Other Topics Concern   Not on file  Social  History Narrative   Violette grew up in [place]. She currently lives in [place] with her husband, [name], and their 2 sons (Lytle  and Chatham ). 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 Shriya belongs to the [employer] faith. She enjoys outdoor activities on her spare time.   Social Determinants of Health   Financial Resource Strain: Not on file  Food Insecurity: Not on file  Transportation Needs: Not on file  Physical Activity: Not on file  Stress: Not on file  Social Connections: Not on file   Family History  Problem Relation Age of Onset   Hyperlipidemia Father    Hypertension Father    Clotting disorder Sister    Hepatitis B Brother    Breast cancer Paternal Grandmother 85       with mets   Cervical cancer Mother        dx. in her early 74s   Breast cancer Paternal Aunt 50   Throat cancer Paternal Aunt 5   Breast cancer  Paternal Aunt 68   Breast cancer Paternal Aunt        33   Skin cancer Paternal Aunt    Cancer Cousin        unknown type dx. in her late 20s/early 30s   Skin cancer Cousin        13 cancerous moles removed in her 21s   Outpatient Encounter Medications as of 02/01/2023  Medication Sig   EPINEPHrine (EPIPEN 2-PAK) 0.3 mg/0.3 mL IJ SOAJ injection Inject 0.3 mg into the muscle as needed for anaphylaxis.   cetirizine (ZYRTEC ALLERGY) 10 MG tablet Take 10 mg by mouth.   Cholecalciferol (VITAMIN D3) 50 MCG (2000 UT) capsule Take 2,000 Units by mouth daily.   Cod Liver Oil CAPS Take 1 capsule by mouth at bedtime.    diclofenac Sodium (VOLTAREN) 1 % GEL Apply 2 g topically 4 (four) times daily.   EMGALITY 120 MG/ML SOAJ Inject into the skin every 30 (thirty) days.   Fexofenadine HCl (ALLEGRA ALLERGY PO) Take 1 tablet by mouth daily.   Fluticasone Propionate (XHANCE) 93 MCG/ACT EXHU Place 1-2 puffs into the nose daily as needed.   levocetirizine (XYZAL) 5 MG tablet TAKE 1 TABLET BY MOUTH EVERY DAY IN THE EVENING   Omega-3 Fatty Acids (FISH OIL PO) Take by mouth.   SYNTHROID 88 MCG tablet Take 1 tablet (88 mcg total) by mouth daily before breakfast.   [DISCONTINUED] baclofen (LIORESAL) 10 MG tablet as needed.   [DISCONTINUED] SYNTHROID 88 MCG tablet Take 1 tablet (88 mcg total) by mouth daily before breakfast.   No facility-administered encounter medications on file as of 02/01/2023.   ALLERGIES: Allergies  Allergen Reactions   Darvon [Propoxyphene] Other (See Comments)    Hallucinations    Onion Other (See Comments)    Migraines   Other     popcorn   Percocet [Oxycodone-Acetaminophen] Hives   Vicodin [Hydrocodone-Acetaminophen] Hives    VACCINATION STATUS: Immunization History  Administered Date(s) Administered   DTaP 10/03/2011   Hepb-cpg 01/26/2022, 03/07/2022   Influenza,inj,Quad PF,6+ Mos 08/15/2018, 06/19/2019, 07/01/2020, 07/27/2021, 05/11/2022   Influenza-Unspecified  06/19/2019   PFIZER(Purple Top)SARS-COV-2 Vaccination 12/23/2019, 01/15/2020   Tdap 04/01/2016    HPI Sarah Moran is 42 y.o. female who presents today with a medical history as above. she is being seen in follow-up after she was seen in consultation for history of thyroid cancer requested by Marikay Alar  G, MD.   She was diagnosed with thyroid cancer in 2007, was operated on by Dr. Jenne Campus.  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  She received 99.7 mCi of I-131 on November 11, 2005 at [facility]. I do not see a whole-body scan in her records.  She has been given various dose of Synthroid over the years.  She is currently on Synthroid 88 mcg p.o. daily before breakfast.  She has no new complaints today.  She wants to achieve some weight loss.    -Her previsit thyroid ultrasound is negative for any significant findings. She denies family history of thyroid malignancy.  She reports that she does not tolerate generic levothyroxine.  She denies dysphagia, shortness of breath, nor voice change. Does not palpitations, tremors, nor heat intolerance.  Review of Systems  Constitutional: + Minimally fluctuating body weight, no fatigue, no subjective hyperthermia, no subjective hypothermia Eyes: no blurry vision, no xerophthalmia ENT: no sore throat, no nodules palpated in throat, no dysphagia/odynophagia, no hoarseness   Objective:       02/01/2023    1:22 PM 11/16/2022    3:17 PM 08/25/2022   10:08 AM  Vitals with BMI  Height 5\' 0"  5\' 0"  5\' 0"   Weight 135 lbs 132 lbs 10 oz 129 lbs  BMI 26.37 25.9 25.19  Systolic 96 100 92  Diastolic 62 62 64  Pulse 64 98 72    BP 96/62   Pulse 64   Ht 5' (1.524 m)   Wt 135 lb (61.2 kg)   BMI 26.37 kg/m   Wt Readings from Last 3 Encounters:  02/01/23 135 lb (61.2 kg)  11/16/22 132 lb 9.6 oz (60.1 kg)  08/25/22 129 lb (58.5 kg)     Physical Exam  Constitutional:  Body mass index is 26.37 kg/m.,  not in acute distress, normal state of mind Eyes: PERRLA, EOMI, no exophthalmos ENT: moist mucous membranes,+ old thyroidectomy scar in anterior lower neck.  no gross cervical lymphadenopathy   CMP ( most recent) CMP     Component Value Date/Time   NA 138 08/18/2022 0914   NA 142 12/23/2021 0000   NA 140 12/06/2012 1056   K 4.2 08/18/2022 0914   K 3.9 12/06/2012 1056   CL 105 08/18/2022 0914   CL 106 12/06/2012 1056   CO2 28 08/18/2022 0914   CO2 29 12/06/2012 1056   GLUCOSE 88 08/18/2022 0914   GLUCOSE 90 12/06/2012 1056   BUN 10 08/18/2022 0914   BUN 7 12/23/2021 0000   BUN 8 12/06/2012 1056   CREATININE 0.66 08/18/2022 0914   CREATININE 0.79 12/06/2012 1056   CALCIUM 8.8 08/18/2022 0914   CALCIUM 8.7 12/06/2012 1056   PROT 6.4 08/18/2022 0914   PROT 7.2 12/06/2012 1056   ALBUMIN 4.1 08/18/2022 0914   ALBUMIN 3.5 12/06/2012 1056   AST 13 08/18/2022 0914   AST 18 12/06/2012 1056   ALT 7 08/18/2022 0914   ALT 17 12/06/2012 1056   ALKPHOS 53 08/18/2022 0914   ALKPHOS 56 12/06/2012 1056   BILITOT 0.6 08/18/2022 0914   BILITOT 0.2 12/06/2012 1056   GFRNONAA >60 07/24/2020 1118   GFRNONAA >60 12/06/2012 1056   GFRAA >60 05/19/2020 0640   GFRAA >60 12/06/2012 1056     Diabetic Labs (most recent): Lab Results  Component Value Date   HGBA1C 5.7 08/18/2022   HGBA1C 5.9 07/27/2021  HGBA1C 5.7 06/19/2019     Lipid Panel ( most recent) Lipid Panel     Component Value Date/Time   CHOL 207 (H) 08/18/2022 0914   TRIG 122.0 08/18/2022 0914   HDL 49.80 08/18/2022 0914   CHOLHDL 4 08/18/2022 0914   VLDL 24.4 08/18/2022 0914   LDLCALC 132 (H) 08/18/2022 0914      Lab Results  Component Value Date   TSH 0.489 01/23/2023   TSH 0.287 (L) 11/11/2022   TSH 2.450 08/01/2022   TSH 1.58 05/11/2022   TSH 0.25 (L) 03/07/2022   TSH 0.03 Repeated and verified X2. (L) 01/24/2022   TSH 1.00 03/25/2021    TSH 0.75 02/18/2021   TSH 4.56 (H) 12/31/2020   TSH 3.77 11/06/2020   FREET4 1.68 01/23/2023   FREET4 1.57 11/11/2022   FREET4 1.40 08/01/2022   FREET4 1.08 03/25/2021   FREET4 1.53 07/31/2020   FREET4 1.47 (H) 07/24/2020   FREET4 1.11 07/01/2020   FREET4 1.32 05/13/2020   FREET4 1.72 (H) 03/25/2020   FREET4 1.82 (H) 02/18/2020        Thyroid/neck ultrasound on July 28, 2022 FINDINGS: Isthmus: Surgically absent. There is no residual nodular soft tissue within the isthmic resection bed.   Right lobe: Surgically absent. There is no residual nodular soft tissue within the right lobectomy resection bed.   Left lobe: Surgically absent. There is no residual nodular soft tissue within the left lobectomy resection bed.    Assessment & Plan:   1. Postsurgical hypothyroidism 2. History of thyroid cancer   - I have reviewed her new and available thyroid records and clinically evaluated the patient. - Based on these reviews, she has history of papillary thyroid cancer status postsurgery and radioactive iodine thyroid remnant ablation in 2007.  This completes her initial intensive treatment for differentiated thyroid cancer.   Her previsit thyroid/neck  ultrasound is negative. -Her thyroglobulin levels are still pending.   Regarding her postsurgical hypothyroidism, she is currently on Synthroid 88 mcg p.o. daily before breakfast breakfast.  She is tolerating and responding to this medication.    - We discussed about the correct intake of her thyroid hormone, on empty stomach at fasting, with water, separated by at least 30 minutes from breakfast and other medications,  and separated by more than 4 hours from calcium, iron, multivitamins, acid reflux medications (PPIs). -Patient is made aware of the fact that thyroid hormone replacement is needed for life, dose to be adjusted by periodic monitoring of thyroid function tests.  Regarding her weight concern: I discussed and recommended  whole food plant-based diet with her.   - she is advised to maintain close follow up with [facility] Yehuda Mao, MD for primary care needs.   I spent 26 minutes in the care of the patient today including review of labs from Thyroid Function, CMP, and other relevant labs ; imaging/biopsy records (current and previous including abstractions from other facilities); face-to-face time discussing  her lab results and symptoms, medications doses, her options of short and long term treatment based on the latest standards of care / guidelines;   and documenting the encounter.  Sarah Moran  participated in the discussions, expressed understanding, and voiced agreement with the above plans.  All questions were answered to her satisfaction. she is encouraged to contact clinic should she have any questions or concerns prior to her return visit.   Follow up plan: Return in about 6 months (around 08/04/2023) for F/U with Pre-visit Labs.  Marquis Lunch, MD [facility] [address] [place], [place] [address] Phone: [phone]  Fax: [phone]    02/01/2023, 5:23 PM  This note was partially dictated with voice recognition software. Similar sounding words can be transcribed inadequately or may not  be corrected upon review.

## 2023-02-08 ENCOUNTER — Ambulatory Visit (INDEPENDENT_AMBULATORY_CARE_PROVIDER_SITE_OTHER): Payer: BC Managed Care – PPO

## 2023-02-08 DIAGNOSIS — G43719 Chronic migraine without aura, intractable, without status migrainosus: Secondary | ICD-10-CM | POA: Diagnosis not present

## 2023-02-08 DIAGNOSIS — J309 Allergic rhinitis, unspecified: Secondary | ICD-10-CM

## 2023-02-08 DIAGNOSIS — M542 Cervicalgia: Secondary | ICD-10-CM | POA: Diagnosis not present

## 2023-02-10 ENCOUNTER — Ambulatory Visit (INDEPENDENT_AMBULATORY_CARE_PROVIDER_SITE_OTHER): Payer: BC Managed Care – PPO

## 2023-02-10 DIAGNOSIS — J309 Allergic rhinitis, unspecified: Secondary | ICD-10-CM

## 2023-02-15 ENCOUNTER — Ambulatory Visit (INDEPENDENT_AMBULATORY_CARE_PROVIDER_SITE_OTHER): Payer: BC Managed Care – PPO

## 2023-02-15 DIAGNOSIS — J309 Allergic rhinitis, unspecified: Secondary | ICD-10-CM

## 2023-02-17 ENCOUNTER — Encounter: Payer: Self-pay | Admitting: Family Medicine

## 2023-02-17 ENCOUNTER — Ambulatory Visit: Payer: BC Managed Care – PPO | Admitting: Family Medicine

## 2023-02-17 ENCOUNTER — Ambulatory Visit (INDEPENDENT_AMBULATORY_CARE_PROVIDER_SITE_OTHER): Payer: BC Managed Care – PPO

## 2023-02-17 VITALS — BP 116/74 | HR 64 | Temp 97.9°F | Ht 60.0 in | Wt 135.2 lb

## 2023-02-17 DIAGNOSIS — E663 Overweight: Secondary | ICD-10-CM | POA: Diagnosis not present

## 2023-02-17 DIAGNOSIS — J309 Allergic rhinitis, unspecified: Secondary | ICD-10-CM | POA: Diagnosis not present

## 2023-02-17 DIAGNOSIS — N644 Mastodynia: Secondary | ICD-10-CM

## 2023-02-17 DIAGNOSIS — R7303 Prediabetes: Secondary | ICD-10-CM | POA: Diagnosis not present

## 2023-02-17 DIAGNOSIS — E039 Hypothyroidism, unspecified: Secondary | ICD-10-CM | POA: Diagnosis not present

## 2023-02-17 DIAGNOSIS — E785 Hyperlipidemia, unspecified: Secondary | ICD-10-CM

## 2023-02-17 LAB — POCT GLYCOSYLATED HEMOGLOBIN (HGB A1C): Hemoglobin A1C: 5.2 % (ref 4.0–5.6)

## 2023-02-17 NOTE — Assessment & Plan Note (Signed)
Chronic issue.  Recent lab work adequately controlled.  Continue Synthroid 88 mcg daily.

## 2023-02-17 NOTE — Assessment & Plan Note (Signed)
Chronic issue.  Encouraged healthy diet and exercise.  Check A1c. 

## 2023-02-17 NOTE — Assessment & Plan Note (Signed)
Patient reports widespread breast tenderness earlier this year.  Has improved at this time.  Exam completed today.  Diagnostic mammogram and ultrasound ordered.

## 2023-02-17 NOTE — Assessment & Plan Note (Signed)
Chronic issue.  Encouraged healthy diet and exercise. 

## 2023-02-17 NOTE — Progress Notes (Signed)
Marikay Alar, MD Phone: [phone]  Sarah Moran is a 42 y.o. female who presents today for f/u.  HYPOTHYROIDISM Disease Monitoring Weight changes: no  Skin Changes: no Heat/Cold intolerance: no  Medication Monitoring Compliance:  taking synthroid Saw endo recently. Labs were acceptable. Planned for 6 month follow-up.    Last TSH:   Lab Results  Component Value Date   TSH 0.489 01/23/2023   Prediabetes/overweight: Patient would like to lose 12 pounds.  She is eating mostly fruits and vegetables.  She does not eat much meat.  She does do lentils and chickpeas as well as plant-based proteins.  She notes generally she does some cardio and weightlifting most weeks though has slacked off recently.  Breast sensitivity: Patient notes she was having issues with this earlier this year.  She tried to call and schedule her mammogram though they advise she needed it ordered by me given her sensitivity issues.  She reports a chronic lump in her left breast.  She notes the sensitivity in her breast is still occurring though has improved.  No nipple discharge or bleeding from her nipples.  Social History   Tobacco Use  Smoking Status Never  Smokeless Tobacco Never    Current Outpatient Medications on File Prior to Visit  Medication Sig Dispense Refill   baclofen (LIORESAL) 10 MG tablet Take 10 mg by mouth 2 (two) times daily as needed.     cetirizine (ZYRTEC ALLERGY) 10 MG tablet Take 10 mg by mouth.     Cholecalciferol (VITAMIN D3) 50 MCG (2000 UT) capsule Take 2,000 Units by mouth daily.     Cod Liver Oil CAPS Take 1 capsule by mouth at bedtime.      diclofenac Sodium (VOLTAREN) 1 % GEL Apply 2 g topically 4 (four) times daily.     EMGALITY 120 MG/ML SOAJ Inject into the skin every 30 (thirty) days.     EPINEPHrine (EPIPEN 2-PAK) 0.3 mg/0.3 mL IJ SOAJ injection Inject 0.3 mg into the muscle as needed for anaphylaxis. 1 each 1   Fexofenadine HCl (ALLEGRA ALLERGY PO) Take 1 tablet by  mouth daily.     Fluticasone Propionate (XHANCE) 93 MCG/ACT EXHU Place 1-2 puffs into the nose daily as needed. 16 mL 5   levocetirizine (XYZAL) 5 MG tablet TAKE 1 TABLET BY MOUTH EVERY DAY IN THE EVENING 90 tablet 0   Omega-3 Fatty Acids (FISH OIL PO) Take by mouth.     SYNTHROID 88 MCG tablet Take 1 tablet (88 mcg total) by mouth daily before breakfast. 93 tablet 1   No current facility-administered medications on file prior to visit.     ROS see history of present illness  Objective  Physical Exam Vitals:   02/17/23 0808  BP: 116/74  Pulse: 64  Temp: 97.9 F (36.6 C)  SpO2: 99%    BP Readings from Last 3 Encounters:  02/17/23 116/74  02/01/23 96/62  11/16/22 100/62   Wt Readings from Last 3 Encounters:  02/17/23 135 lb 3.2 oz (61.3 kg)  02/01/23 135 lb (61.2 kg)  11/16/22 132 lb 9.6 oz (60.1 kg)    Physical Exam Constitutional:      General: She is not in acute distress.    Appearance: She is not diaphoretic.  Cardiovascular:     Rate and Rhythm: Normal rate and regular rhythm.     Heart sounds: Normal heart sounds.  Pulmonary:     Effort: Pulmonary effort is normal.     Breath sounds: Normal  breath sounds.  Chest:       Comments: Breast exam other than as outlined there is no mass, tenderness, nipple inversion, or axillary masses, Prince Solian, CMA served as chaperone Skin:    General: Skin is warm and dry.  Neurological:     Mental Status: She is alert.      Assessment/Plan: Please see individual problem list.  Hypothyroidism, unspecified type Assessment & Plan: Chronic issue.  Recent lab work adequately controlled.  Continue Synthroid 88 mcg daily.   Prediabetes Assessment & Plan: Chronic issue.  Encouraged healthy diet and exercise.  Check A1c.  Orders: -     POCT glycosylated hemoglobin (Hb A1C)  Overweight Assessment & Plan: Chronic issue.  Encouraged healthy diet and exercise.   Breast tenderness in female Assessment &  Plan: Patient reports widespread breast tenderness earlier this year.  Has improved at this time.  Exam completed today.  Diagnostic mammogram and ultrasound ordered.  Orders: -     MM 3D DIAGNOSTIC MAMMOGRAM BILATERAL BREAST; Future -     [place] LIMITED ULTRASOUND INCLUDING AXILLA LEFT BREAST ; Future -     [place] LIMITED ULTRASOUND INCLUDING AXILLA RIGHT BREAST; Future     Return in about 6 months (around 08/19/2023) for physical.   Marikay Alar, MD [facility]

## 2023-03-01 ENCOUNTER — Ambulatory Visit (INDEPENDENT_AMBULATORY_CARE_PROVIDER_SITE_OTHER): Payer: BC Managed Care – PPO

## 2023-03-01 DIAGNOSIS — J309 Allergic rhinitis, unspecified: Secondary | ICD-10-CM

## 2023-03-02 ENCOUNTER — Ambulatory Visit
Admission: RE | Admit: 2023-03-02 | Discharge: 2023-03-02 | Disposition: A | Discharge status: Home / Self Care | Payer: BC Managed Care – PPO | Source: Ambulatory Visit | Attending: Family Medicine | Admitting: Family Medicine

## 2023-03-02 DIAGNOSIS — N644 Mastodynia: Secondary | ICD-10-CM

## 2023-03-02 DIAGNOSIS — Z803 Family history of malignant neoplasm of breast: Secondary | ICD-10-CM | POA: Diagnosis not present

## 2023-03-03 ENCOUNTER — Ambulatory Visit (INDEPENDENT_AMBULATORY_CARE_PROVIDER_SITE_OTHER): Payer: BC Managed Care – PPO

## 2023-03-03 DIAGNOSIS — J309 Allergic rhinitis, unspecified: Secondary | ICD-10-CM | POA: Diagnosis not present

## 2023-03-08 ENCOUNTER — Ambulatory Visit (INDEPENDENT_AMBULATORY_CARE_PROVIDER_SITE_OTHER): Payer: BC Managed Care – PPO

## 2023-03-08 DIAGNOSIS — J309 Allergic rhinitis, unspecified: Secondary | ICD-10-CM | POA: Diagnosis not present

## 2023-03-10 ENCOUNTER — Ambulatory Visit (INDEPENDENT_AMBULATORY_CARE_PROVIDER_SITE_OTHER): Payer: BC Managed Care – PPO

## 2023-03-10 DIAGNOSIS — J309 Allergic rhinitis, unspecified: Secondary | ICD-10-CM | POA: Diagnosis not present

## 2023-03-15 ENCOUNTER — Ambulatory Visit (INDEPENDENT_AMBULATORY_CARE_PROVIDER_SITE_OTHER): Payer: BC Managed Care – PPO

## 2023-03-15 DIAGNOSIS — J309 Allergic rhinitis, unspecified: Secondary | ICD-10-CM | POA: Diagnosis not present

## 2023-03-21 DIAGNOSIS — M542 Cervicalgia: Secondary | ICD-10-CM | POA: Diagnosis not present

## 2023-03-21 DIAGNOSIS — G43719 Chronic migraine without aura, intractable, without status migrainosus: Secondary | ICD-10-CM | POA: Diagnosis not present

## 2023-03-22 ENCOUNTER — Ambulatory Visit (INDEPENDENT_AMBULATORY_CARE_PROVIDER_SITE_OTHER): Payer: BC Managed Care – PPO

## 2023-03-22 DIAGNOSIS — J309 Allergic rhinitis, unspecified: Secondary | ICD-10-CM

## 2023-03-29 ENCOUNTER — Ambulatory Visit (INDEPENDENT_AMBULATORY_CARE_PROVIDER_SITE_OTHER): Payer: BC Managed Care – PPO

## 2023-03-29 DIAGNOSIS — J309 Allergic rhinitis, unspecified: Secondary | ICD-10-CM

## 2023-04-05 ENCOUNTER — Ambulatory Visit (INDEPENDENT_AMBULATORY_CARE_PROVIDER_SITE_OTHER): Payer: BC Managed Care – PPO

## 2023-04-05 DIAGNOSIS — J309 Allergic rhinitis, unspecified: Secondary | ICD-10-CM

## 2023-04-07 ENCOUNTER — Other Ambulatory Visit: Payer: Self-pay | Admitting: Allergy

## 2023-04-12 ENCOUNTER — Ambulatory Visit (INDEPENDENT_AMBULATORY_CARE_PROVIDER_SITE_OTHER): Payer: BC Managed Care – PPO

## 2023-04-12 DIAGNOSIS — J309 Allergic rhinitis, unspecified: Secondary | ICD-10-CM

## 2023-04-14 ENCOUNTER — Ambulatory Visit (INDEPENDENT_AMBULATORY_CARE_PROVIDER_SITE_OTHER): Payer: BC Managed Care – PPO

## 2023-04-14 DIAGNOSIS — J309 Allergic rhinitis, unspecified: Secondary | ICD-10-CM | POA: Diagnosis not present

## 2023-04-19 ENCOUNTER — Ambulatory Visit (INDEPENDENT_AMBULATORY_CARE_PROVIDER_SITE_OTHER): Payer: BC Managed Care – PPO

## 2023-04-19 DIAGNOSIS — J309 Allergic rhinitis, unspecified: Secondary | ICD-10-CM

## 2023-04-21 ENCOUNTER — Ambulatory Visit (INDEPENDENT_AMBULATORY_CARE_PROVIDER_SITE_OTHER): Payer: Self-pay

## 2023-04-21 DIAGNOSIS — J309 Allergic rhinitis, unspecified: Secondary | ICD-10-CM

## 2023-04-26 ENCOUNTER — Ambulatory Visit (INDEPENDENT_AMBULATORY_CARE_PROVIDER_SITE_OTHER): Payer: Self-pay

## 2023-04-26 DIAGNOSIS — J309 Allergic rhinitis, unspecified: Secondary | ICD-10-CM | POA: Diagnosis not present

## 2023-05-02 DIAGNOSIS — M542 Cervicalgia: Secondary | ICD-10-CM | POA: Diagnosis not present

## 2023-05-02 DIAGNOSIS — G43719 Chronic migraine without aura, intractable, without status migrainosus: Secondary | ICD-10-CM | POA: Diagnosis not present

## 2023-05-03 ENCOUNTER — Ambulatory Visit (INDEPENDENT_AMBULATORY_CARE_PROVIDER_SITE_OTHER): Payer: BC Managed Care – PPO

## 2023-05-03 DIAGNOSIS — J309 Allergic rhinitis, unspecified: Secondary | ICD-10-CM

## 2023-05-12 ENCOUNTER — Ambulatory Visit (INDEPENDENT_AMBULATORY_CARE_PROVIDER_SITE_OTHER): Payer: BC Managed Care – PPO

## 2023-05-12 DIAGNOSIS — J309 Allergic rhinitis, unspecified: Secondary | ICD-10-CM | POA: Diagnosis not present

## 2023-05-17 ENCOUNTER — Encounter: Payer: Self-pay | Admitting: Allergy & Immunology

## 2023-05-17 ENCOUNTER — Ambulatory Visit: Payer: BC Managed Care – PPO | Admitting: Allergy & Immunology

## 2023-05-17 ENCOUNTER — Other Ambulatory Visit: Payer: Self-pay

## 2023-05-17 VITALS — BP 120/76 | HR 97 | Temp 97.7°F | Resp 18 | Ht 60.0 in | Wt 137.6 lb

## 2023-05-17 DIAGNOSIS — G43809 Other migraine, not intractable, without status migrainosus: Secondary | ICD-10-CM

## 2023-05-17 DIAGNOSIS — J309 Allergic rhinitis, unspecified: Secondary | ICD-10-CM

## 2023-05-17 DIAGNOSIS — J3089 Other allergic rhinitis: Secondary | ICD-10-CM | POA: Diagnosis not present

## 2023-05-17 DIAGNOSIS — R42 Dizziness and giddiness: Secondary | ICD-10-CM | POA: Diagnosis not present

## 2023-05-17 DIAGNOSIS — J302 Other seasonal allergic rhinitis: Secondary | ICD-10-CM

## 2023-05-17 MED ORDER — XHANCE 93 MCG/ACT NA EXHU: 1.0000 | INHALANT_SUSPENSION | Freq: Every day | NASAL | 11 refills | Status: DC | PRN

## 2023-05-17 NOTE — Patient Instructions (Addendum)
1. Seasonal and perennial allergic rhinitis - with overlying migraines (grasses, ragweed, weeds, trees, indoor molds, outdoor molds, cat, and dog) - Continue taking: Allegra one tablet in the morning and Xyzal at night - Continue taking: Zyrtec and Xyzal at night - Continue taking: Xhance one spray per nostril up to twice daily. - You can use an extra dose of the antihistamine, if needed, for breakthrough symptoms.  - We will touch base in 3-6 months to see how things are going.  - We are going to remix your vials to increase the pollens in the shots.  2. Vertigo - Continue to follow with the headache doctor.   3. Return in about 6 months (around 11/17/2023).    Please inform us of any Emergency Department visits, hospitalizations, or changes in symptoms. Call us before going to the ED for breathing or allergy symptoms since we might be able to fit you in for a sick visit. Feel free to contact us anytime with any questions, problems, or concerns.  It was a pleasure to see you again today!  Websites that have reliable patient information: 1. American Academy of Asthma, Allergy, and Immunology: www.aaaai.org 2. Food Allergy Research and Education (FARE): foodallergy.org 3. Mothers of Asthmatics: http://www.asthmacommunitynetwork.org 4. American College of Allergy, Asthma, and Immunology: www.acaai.org   COVID-19 Vaccine Information can be found at: [email or web address].[email or web address] For questions related to vaccine distribution or appointments, please email [email or web address] or call [phone].   We realize that you might be concerned about having an allergic reaction to the COVID19 vaccines. To help with that concern, WE ARE OFFERING THE COVID19 VACCINES IN OUR OFFICE! Ask the front desk for dates!     "Like" [place] on Facebook and Instagram for our latest updates!      A healthy democracy works best when [employer] participate! Make sure  you are registered to vote! If you have moved or changed any of your contact information, you will need to get this updated before voting!  In some cases, you MAY be able to register to vote online: [email or web address]

## 2023-05-17 NOTE — Progress Notes (Signed)
FOLLOW UP  Date of Service/Encounter:  05/17/23   Assessment:   Seasonal and perennial allergic rhinitis (grasses, ragweed, weeds, trees, indoor molds, outdoor molds, cat, and dog) - on allergen immunotherapy with maintenance reached August 2024   Chronic migraines - now receiving injections for this   Mouth breakouts over the mouth - with negative testing to popcorn    Overall, she is getting some relief from her allergy shots.  I would like her to be off of medications, but we are going to try remix anyway vials to include higher concentration of the pollens.  Hopefully this will provide better relief of her symptoms.  We will see how she is doing in 4 to 6 months and make decisions after that.  Plan/Recommendations:   1. Seasonal and perennial allergic rhinitis - with overlying migraines (grasses, ragweed, weeds, trees, indoor molds, outdoor molds, cat, and dog) - Continue taking: Allegra one tablet in the morning and Xyzal at night - Continue taking: Zyrtec and Xyzal at night - Continue taking: Xhance one spray per nostril up to twice daily. - You can use an extra dose of the antihistamine, if needed, for breakthrough symptoms.  - We will touch base in 3-6 months to see how things are going.  - We are going to remix your vials to increase the pollens in the shots.  2. Vertigo - Continue to follow with the headache doctor.   3. Return in about 6 months (around 11/17/2023).   Subjective:   Sarah Moran is a 42 y.o. female presenting today for follow up of  Chief Complaint  Patient presents with   Allergic Rhinitis     [name] - would like it refilled    Other    No change in symptoms even with allergy shots     [name] has a history of the following: Patient Active Problem List   Diagnosis Date Noted   Overweight 02/17/2023   Breast tenderness in female 02/17/2023   Gross hematuria 08/18/2022   Prediabetes 08/18/2022   Depression, major, single  episode, 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   Decreased libido 05/11/2022   Neutrophilia 03/25/2022   Rash of mouth present on examination 03/25/2022   Supraumbilical hernia 02/16/2022   Peroneal tendinitis, left 01/27/2022   History of ovarian cyst 01/24/2022   Chronic pain of left ankle 01/24/2022   Need for hepatitis B screening test 01/24/2022   Vertigo 07/19/2021   Seasonal and perennial allergic rhinitis 06/04/2021   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   Trapezius strain 11/06/2020   Sleeping difficulty 11/06/2020   Family history of skin cancer    Family history of cervical cancer    Family history of throat cancer    ANA positive 11/06/2019   Arthralgia 09/30/2019   Allergic rhinitis 05/06/2019   Calculus of gallbladder without cholecystitis without obstruction    Prolapse of female pelvic organs 08/02/2018   Rectocele 08/02/2018   Dyspepsia    Muscle ache 04/23/2018   Paresthesia 04/23/2018   Anxiety and depression 04/23/2018   Tension headache 04/23/2018   Hyperlipidemia 05/02/2017   Family history of breast cancer 12/15/2016   History of thyroid cancer 06/27/2014   Hypothyroidism 06/27/2014   Migraine 09/23/2013    History obtained from: chart review and patient.  Sarah Moran is a 42 y.o. female presenting for a follow up visit.  She was last seen February 2024.  At that time, we continue with Allegra and Xyzal.  We also continue with Zyrtec 1  tablet at night when symptoms are particularly bad.  We did talk about doing allergy shots and she did decide to start.  She continued to follow with her headache doctor for the vertigo.  Since last visit, she has not been any better.   She remains on the Allegra in the morning and the Zyrtec and Xyzal in the evening. She was doing the Xyzal in the morning previously. She can now golf and not get a migraine. She thinks that her pollens are her biggest trigger. She also notes that keeping  dairy out of her diet helps. She has phlegm with  any amount of dairy.   She has been on the [place] again. She thought that this was making vertigo worse. But she does feel better on the [place] compared to not being on the [place].  She would like the Nexium and sent in again.  Overall, she was just feeling like she should have been better by this point in time.  However, we did reinforce the fact that she is only just reached the red vial, so relief is probably still yet to come.  Sarah Moran is on allergen immunotherapy. She receives two injections. Immunotherapy script #1 contains  ragweed and molds. She currently receives 0.68mL of the RED vial (1/100). Immunotherapy script #2 contains trees, weeds, grasses, cat, and dog. She currently receives 0.26mL of the RED vial (1/100). She started shots March of 2024 and reached maintenance in August of 2024.  They had a pretty busy summer and they have plans to go to [place] soon.  Evidently, the [place] [employer] are planning to [employer] in [place] and they are going to be showing it on big screens in the stadium.  Tickets are only $30, so that we will give them a chance to experience the stadium and see the [place] [employer] place.  Otherwise, there have been no changes to her past medical history, surgical history, family history, or social history.    Review of systems otherwise negative other than that mentioned in the HPI.    Objective:   Blood pressure 120/76, pulse 97, temperature 97.7 F (36.5 C), resp. rate 18, height 5' (1.524 m), weight 137 lb 9.6 oz (62.4 kg), SpO2 98%. Body mass index is 26.87 kg/m.    Physical Exam Vitals reviewed.  Constitutional:      Appearance: She is well-developed.     Comments: Pleasant. Talkative.   HENT:     Head: Normocephalic and atraumatic.     Right Ear: Tympanic membrane, ear canal and external ear normal.     Left Ear: Tympanic membrane, ear canal and external ear normal.     Nose: No nasal deformity, septal deviation,  mucosal edema or rhinorrhea.     Right Turbinates: Enlarged, swollen and pale.     Left Turbinates: Enlarged, swollen and pale.     Right Sinus: No maxillary sinus tenderness or frontal sinus tenderness.     Left Sinus: No maxillary sinus tenderness or frontal sinus tenderness.     Comments: No polyps.    Mouth/Throat:     Mouth: Mucous membranes are not pale and not dry.     Pharynx: Uvula midline.     Comments: Moderate cobblestoning. Eyes:     General: Lids are normal. Allergic shiner present.        Right eye: No discharge.        Left eye: No discharge.     Conjunctiva/sclera: Conjunctivae normal.  Right eye: Right conjunctiva is not injected. No chemosis.    Left eye: Left conjunctiva is not injected. No chemosis.    Pupils: Pupils are equal, round, and reactive to light.  Cardiovascular:     Rate and Rhythm: Normal rate and regular rhythm.     Heart sounds: Normal heart sounds.  Pulmonary:     Effort: Pulmonary effort is normal. No tachypnea, accessory muscle usage or respiratory distress.     Breath sounds: Normal breath sounds. No wheezing, rhonchi or rales.     Comments: Moving air well in all lung fields.  No increased work of breathing. Chest:     Chest wall: No tenderness.  Lymphadenopathy:     Cervical: No cervical adenopathy.  Skin:    General: Skin is warm.     Capillary Refill: Capillary refill takes less than 2 seconds.     Coloration: Skin is not pale.     Findings: No abrasion, erythema, petechiae or rash. Rash is not papular, urticarial or vesicular.     Comments: No eczematous or urticarial lesions noted.  Neurological:     Mental Status: She is alert.  Psychiatric:        Behavior: Behavior is cooperative.      Diagnostic studies: none      Malachi Bonds, MD  [facility]

## 2023-05-18 NOTE — Progress Notes (Signed)
VIALS TO BE MADE WHEN NEEDED.

## 2023-05-18 NOTE — Progress Notes (Signed)
Aeroallergen Immunotherapy (**NOTE NEW SCRIPT**)   Ordering Provider: Dr. Malachi Bonds   Patient Details  Name: Sarah Moran  MRN: [id number]  Date of Birth: Sep 09, 1981   Order 1 of 2   Vial Label: G/W/T/C/D   0.4 ml (Volume)  BAU Concentration -- 7 Grass Mix* 100,000 ([address] [place], [place], [place], [place], [place], Sweet [place], [place])  0.6 ml (Volume)  BAU Concentration -- [place] 10,000  0.2 ml (Volume)  1:20 Concentration -- [place]  0.8 ml (Volume)  1:20 Concentration -- Weed Mix*  0.8 ml (Volume)  1:20 Concentration -- Eastern 10 Tree Mix (also Sweet Gum)  0.5 ml (Volume)  1:10 Concentration -- Cat Hair  0.5 ml (Volume)  1:10 Concentration -- Dog Epithelia    3.8  ml Extract Subtotal  1.2  ml Diluent  5.0  ml Maintenance Total   Schedule:  A   Red Vial (1:100): Schedule A (14 doses)   Special Instructions: After completion of the first Red Vial, please space to every two weeks. After completion of the second Red Vial, please space to every 4 weeks. Ok to up dose new vials at 0.72mL --> 0.3 mL --> 0.5

## 2023-05-18 NOTE — Progress Notes (Signed)
Aeroallergen Immunotherapy (**NOTE NEW SCRIPT**)  Ordering Provider: Dr. Malachi Bonds  Patient Details Name: RUDIE JAVIER MRN: [id number] Date of Birth: 03-Jul-1981  Order 2 of 2  Vial Label: RW/Molds  0.6 ml (Volume)  1:20 Concentration -- Ragweed Mix 0.2 ml (Volume)  1:20 Concentration -- Alternaria alternata 0.2 ml (Volume)  1:20 Concentration -- Cladosporium herbarum 0.2 ml (Volume)  1:20 Concentration -- Bipolaris sorokiniana 0.2 ml (Volume)  1:20 Concentration -- Drechslera spicifera 0.2 ml (Volume)  1:10 Concentration -- Mucor plumbeus 0.2 ml (Volume)  1:10 Concentration -- Fusarium moniliforme 0.2 ml (Volume)  1:40 Concentration -- Aureobasidium pullulans 0.2 ml (Volume)  1:10 Concentration -- Rhizopus oryzae    2.2  ml Extract Subtotal 2.8  ml Diluent 5.0  ml Maintenance Total  Schedule:  A   Red Vial (1:100): Schedule A (14 doses)  Special Instructions: After completion of the first Red Vial, please space to every two weeks. After completion of the second Red Vial, please space to every 4 weeks. Ok to up dose new vials at 0.45mL --> 0.3 mL --> 0.5 mL.

## 2023-05-31 ENCOUNTER — Ambulatory Visit (INDEPENDENT_AMBULATORY_CARE_PROVIDER_SITE_OTHER): Payer: BC Managed Care – PPO

## 2023-05-31 DIAGNOSIS — J309 Allergic rhinitis, unspecified: Secondary | ICD-10-CM | POA: Diagnosis not present

## 2023-06-07 ENCOUNTER — Ambulatory Visit (INDEPENDENT_AMBULATORY_CARE_PROVIDER_SITE_OTHER): Payer: BC Managed Care – PPO

## 2023-06-07 DIAGNOSIS — J309 Allergic rhinitis, unspecified: Secondary | ICD-10-CM

## 2023-06-08 DIAGNOSIS — M542 Cervicalgia: Secondary | ICD-10-CM | POA: Diagnosis not present

## 2023-06-08 DIAGNOSIS — G43719 Chronic migraine without aura, intractable, without status migrainosus: Secondary | ICD-10-CM | POA: Diagnosis not present

## 2023-06-16 ENCOUNTER — Ambulatory Visit (INDEPENDENT_AMBULATORY_CARE_PROVIDER_SITE_OTHER): Payer: BC Managed Care – PPO

## 2023-06-16 DIAGNOSIS — J309 Allergic rhinitis, unspecified: Secondary | ICD-10-CM

## 2023-06-23 ENCOUNTER — Ambulatory Visit (INDEPENDENT_AMBULATORY_CARE_PROVIDER_SITE_OTHER): Payer: BC Managed Care – PPO

## 2023-06-23 DIAGNOSIS — J309 Allergic rhinitis, unspecified: Secondary | ICD-10-CM

## 2023-06-30 ENCOUNTER — Ambulatory Visit (INDEPENDENT_AMBULATORY_CARE_PROVIDER_SITE_OTHER): Payer: BC Managed Care – PPO

## 2023-06-30 DIAGNOSIS — J309 Allergic rhinitis, unspecified: Secondary | ICD-10-CM | POA: Diagnosis not present

## 2023-07-06 DIAGNOSIS — G43719 Chronic migraine without aura, intractable, without status migrainosus: Secondary | ICD-10-CM | POA: Diagnosis not present

## 2023-07-06 DIAGNOSIS — M542 Cervicalgia: Secondary | ICD-10-CM | POA: Diagnosis not present

## 2023-07-07 ENCOUNTER — Ambulatory Visit (INDEPENDENT_AMBULATORY_CARE_PROVIDER_SITE_OTHER): Payer: BC Managed Care – PPO

## 2023-07-07 DIAGNOSIS — J309 Allergic rhinitis, unspecified: Secondary | ICD-10-CM | POA: Diagnosis not present

## 2023-07-11 ENCOUNTER — Other Ambulatory Visit: Payer: Self-pay

## 2023-07-11 MED ORDER — SYNTHROID 88 MCG PO TABS: 88.0000 ug | ORAL_TABLET | Freq: Every day | ORAL | 0 refills | Status: DC

## 2023-07-12 ENCOUNTER — Ambulatory Visit (INDEPENDENT_AMBULATORY_CARE_PROVIDER_SITE_OTHER): Payer: BC Managed Care – PPO

## 2023-07-12 DIAGNOSIS — J309 Allergic rhinitis, unspecified: Secondary | ICD-10-CM

## 2023-07-21 ENCOUNTER — Ambulatory Visit (INDEPENDENT_AMBULATORY_CARE_PROVIDER_SITE_OTHER): Payer: Self-pay

## 2023-07-21 DIAGNOSIS — J309 Allergic rhinitis, unspecified: Secondary | ICD-10-CM | POA: Diagnosis not present

## 2023-07-24 DIAGNOSIS — J3081 Allergic rhinitis due to animal (cat) (dog) hair and dander: Secondary | ICD-10-CM | POA: Diagnosis not present

## 2023-07-24 NOTE — Progress Notes (Signed)
EXP 07/23/24

## 2023-07-26 ENCOUNTER — Ambulatory Visit (INDEPENDENT_AMBULATORY_CARE_PROVIDER_SITE_OTHER): Payer: Self-pay

## 2023-07-26 DIAGNOSIS — J309 Allergic rhinitis, unspecified: Secondary | ICD-10-CM

## 2023-08-03 DIAGNOSIS — M542 Cervicalgia: Secondary | ICD-10-CM | POA: Diagnosis not present

## 2023-08-03 DIAGNOSIS — G43719 Chronic migraine without aura, intractable, without status migrainosus: Secondary | ICD-10-CM | POA: Diagnosis not present

## 2023-08-07 DIAGNOSIS — E890 Postprocedural hypothyroidism: Secondary | ICD-10-CM | POA: Diagnosis not present

## 2023-08-08 LAB — T4, FREE: Free T4: 1.42 ng/dL (ref 0.82–1.77)

## 2023-08-08 LAB — TSH: TSH: 0.847 u[IU]/mL (ref 0.450–4.500)

## 2023-08-11 ENCOUNTER — Ambulatory Visit (INDEPENDENT_AMBULATORY_CARE_PROVIDER_SITE_OTHER): Payer: Self-pay

## 2023-08-11 DIAGNOSIS — J309 Allergic rhinitis, unspecified: Secondary | ICD-10-CM | POA: Diagnosis not present

## 2023-08-14 ENCOUNTER — Telehealth: Payer: Self-pay | Admitting: Family Medicine

## 2023-08-14 NOTE — Telephone Encounter (Signed)
Patient called back and scheduled an appointment for tomorrow.

## 2023-08-14 NOTE — Telephone Encounter (Signed)
  Symptoms: Last Tuesday symptoms started. Thinks she has a head cold. Severe sore throat. Coughing. No fever. Patient said she needs antibiotics.     Attributing factors (medication changes, positional changes, e  No changes in medication.      Duration :     Pain Scale?  On 1-10 how woiuld you rate your pain? What makes it better or worse?  No pain    Blood pressure            Pulse             Temp          No appoinments were available today. I transferred patient to speak with Access Nurse.

## 2023-08-14 NOTE — Telephone Encounter (Signed)
Noted.  Patient needs to keep her appointment.

## 2023-08-14 NOTE — Telephone Encounter (Signed)
Patient called back and scheduled an appointment for tomorrow.

## 2023-08-15 ENCOUNTER — Ambulatory Visit: Payer: BC Managed Care – PPO | Admitting: Family

## 2023-08-15 ENCOUNTER — Encounter: Payer: Self-pay | Admitting: Family

## 2023-08-15 VITALS — BP 114/62 | HR 69 | Temp 98.0°F | Resp 16 | Ht 60.0 in | Wt 140.5 lb

## 2023-08-15 DIAGNOSIS — J069 Acute upper respiratory infection, unspecified: Secondary | ICD-10-CM | POA: Diagnosis not present

## 2023-08-15 DIAGNOSIS — J029 Acute pharyngitis, unspecified: Secondary | ICD-10-CM | POA: Diagnosis not present

## 2023-08-15 DIAGNOSIS — B9689 Other specified bacterial agents as the cause of diseases classified elsewhere: Secondary | ICD-10-CM

## 2023-08-15 LAB — POCT RAPID STREP A (OFFICE): Rapid Strep A Screen: NEGATIVE

## 2023-08-15 MED ORDER — METHYLPREDNISOLONE 4 MG PO TBPK: ORAL_TABLET | ORAL | 0 refills | Status: DC

## 2023-08-15 MED ORDER — PROMETHAZINE-DM 6.25-15 MG/5ML PO SYRP: 5.0000 mL | ORAL_SOLUTION | Freq: Four times a day (QID) | ORAL | 0 refills | Status: DC | PRN

## 2023-08-15 MED ORDER — AMOXICILLIN 500 MG PO CAPS: 500.0000 mg | ORAL_CAPSULE | Freq: Three times a day (TID) | ORAL | 0 refills | Status: DC

## 2023-08-15 NOTE — Progress Notes (Signed)
Acute Office Visit  Subjective:     Patient ID: Sarah Moran, female    DOB: 04-18-1981, 42 y.o.   MRN: [id number]  Chief Complaint  Patient presents with  . Sore Throat    Since last Tuesday 2 covid test neg  . Nasal Congestion    HPI Patient is a 42 year old white female, patient of Dr. Birdie Sons presents today with complaints of sore throat, head congestion, cough x 1 week and worsening.  Has been taking over-the-counter Mucinex, Robitussin, NyQuil and a nasal rinse without much relief.  Reports sick contact with one of her friends children about a week ago.  Denies any fever or chills.  She is taken to rapid COVID test at home that were both negative.  Review of Systems  Constitutional:  Negative for chills and fever.  HENT:  Positive for congestion and sinus pain.   Respiratory:  Positive for cough. Negative for shortness of breath and wheezing.   Cardiovascular: Negative.   Musculoskeletal: Negative.   Neurological: Negative.   Endo/Heme/Allergies: Negative.   Psychiatric/Behavioral: Negative.    All other systems reviewed and are negative.  Past Medical History:  Diagnosis Date  . Allergy   . Anal fissure   . Anxiety    no meds  . Chronic pelvic pain in female   . Family history of breast cancer    My Risk neg 5/18  . Family history of cervical cancer   . Family history of skin cancer   . Family history of throat cancer   . Frequent headaches   . Gallstone   . Gallstones   . Genetic testing of female 01/2017   My Risk/BRCA neg  . GERD (gastroesophageal reflux disease)    well controlled. Takes gingerroot when symptoms  . H. pylori infection 06/19/2018  . Hypothyroidism    s/p thyroidectomy - levothyroxine 75 mcg daily  . IBS (irritable bowel syndrome)    constipation - takes miralax daily  . Increased risk of breast cancer 01/2017   IBIS=18.8%/riskscore=21.9%  . Liver cyst 04/2018  . Migraine    takes excedrin. She has tried imitrex in the past    . Personal history of radiation therapy   . Seizure (HCC) 1997    x 1-age 59-after surgery for scoliosis  . Thyroid cancer (HCC) 2007   s/p thyroidectomy    Social History   Socioeconomic History  . Marital status: Married    Spouse name: [name]  . Number of children: 2  . Years of education: 57  . Highest education level: Not on file  Occupational History  . Occupation: Stay at home mom  Tobacco Use  . Smoking status: Never  . Smokeless tobacco: Never  Vaping Use  . Vaping status: Never Used  Substance and Sexual Activity  . Alcohol use: Not Currently  . Drug use: No  . Sexual activity: Yes    Birth control/protection: Surgical    Comment: Hysterectomy  Other Topics Concern  . Not on file  Social History Narrative   Sarah Moran grew up in [place]. She currently lives in [place] with her husband, [name], and their 2 sons ([place]  and [place] ). 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 Judythe belongs to the [employer] faith. She enjoys outdoor activities on her spare time.   Social Determinants of Health   Financial Resource Strain: Not on file  Food Insecurity: Not on file  Transportation Needs:  Not on file  Physical Activity: Not on file  Stress: Not on file  Social Connections: Not on file  Intimate Partner Violence: Not on file    Past Surgical History:  Procedure Laterality Date  . ANAL RECTAL MANOMETRY N/A 10/12/2018   Procedure: ANO RECTAL MANOMETRY;  Surgeon: Napoleon Form, MD;  Location: [facility];  Service: Endoscopy;  Laterality: N/A;  . ANTERIOR AND POSTERIOR REPAIR WITH SACROSPINOUS FIXATION N/A 08/02/2018   Procedure: ANTERIOR AND POSTERIOR REPAIR;  Surgeon: Natale Milch, MD;  Location: [facility] OR[facility];  Service: Gynecology;  Laterality: N/A;  . BACK SURGERY  1997   scoliosis  . CHOLECYSTECTOMY N/A 09/21/2018   Procedure: LAPAROSCOPIC CHOLECYSTECTOMY;  Surgeon: Duanne Guess, MD;   Location: [facility] ORS;  Service: General;  Laterality: N/A;  . COLONOSCOPY    . ESOPHAGOGASTRODUODENOSCOPY (EGD) WITH PROPOFOL N/A 06/12/2018   Procedure: ESOPHAGOGASTRODUODENOSCOPY (EGD) WITH PROPOFOL;  Surgeon: Toney Reil, MD;  Location: [facility] END[facility]COPY;  Service: Gastroenterology;  Laterality: N/A;  . HERNIA REPAIR  1990   bilateral inguinal  . PUBOVAGINAL SLING N/A 08/02/2018   Procedure: PUBO-VAGINAL SLING- TVT;  Surgeon: Natale Milch, MD;  Location: [facility] ORS;  Service: Gynecology;  Laterality: N/A;  . THYROIDECTOMY  2007   thyroid cancer  . TOTAL ABDOMINAL HYSTERECTOMY  2008   dymenorrhea    Family History  Problem Relation Age of Onset  . Hyperlipidemia Father   . Hypertension Father   . Clotting disorder Sister   . Hepatitis B Brother   . Breast cancer Paternal Grandmother 65       with mets  . Cervical cancer Mother        dx. in her early 4s  . Breast cancer Paternal Aunt 61  . Throat cancer Paternal Aunt 10  . Breast cancer Paternal Aunt 60  . Breast cancer Paternal Aunt        52  . Skin cancer Paternal Aunt   . Cancer Cousin        unknown type dx. in her late 20s/early 30s  . Skin cancer Cousin        13 cancerous moles removed in her 40s    Allergies  Allergen Reactions  . Darvon [Propoxyphene] Other (See Comments)    Hallucinations   . Onion Other (See Comments)    Migraines  . Percocet [Oxycodone-Acetaminophen] Hives  . Vicodin [Hydrocodone-Acetaminophen] Hives    Current Outpatient Medications on File Prior to Visit  Medication Sig Dispense Refill  . baclofen (LIORESAL) 10 MG tablet Take 10 mg by mouth 2 (two) times daily as needed.    . cetirizine (ZYRTEC ALLERGY) 10 MG tablet Take 10 mg by mouth.    . Cholecalciferol (VITAMIN D3) 50 MCG (2000 UT) capsule Take 2,000 Units by mouth daily.    Marland Kitchen Cod Liver Oil CAPS Take 1 capsule by mouth at bedtime.     . diclofenac Sodium (VOLTAREN) 1 % GEL Apply 2 g topically 4 (four) times daily.     Marland Kitchen EMGALITY 120 MG/ML SOAJ Inject into the skin every 30 (thirty) days.    Marland Kitchen EPINEPHrine (EPIPEN 2-PAK) 0.3 mg/0.3 mL IJ SOAJ injection Inject 0.3 mg into the muscle as needed for anaphylaxis. 1 each 1  . Fexofenadine HCl (ALLEGRA ALLERGY PO) Take 1 tablet by mouth daily.    . Fluticasone Propionate (XHANCE) 93 MCG/ACT EXHU Place 1-2 puffs into the nose daily as needed. 16 mL 11  . levocetirizine (XYZAL) 5 MG tablet TAKE 1  TABLET BY MOUTH EVERY DAY IN THE EVENING 90 tablet 1  . Omega-3 Fatty Acids (FISH OIL PO) Take by mouth.    . polyethylene glycol (MIRALAX / GLYCOLAX) 17 g packet Take 17 g by mouth daily.    Marland Kitchen SYNTHROID 88 MCG tablet Take 1 tablet (88 mcg total) by mouth daily before breakfast. 90 tablet 0  . Turmeric 400 MG CAPS Take by mouth.     No current facility-administered medications on file prior to visit.    BP 114/62   Pulse 69   Temp 98 F (36.7 C)   Resp 16   Ht 5' (1.524 m)   Wt 140 lb 8 oz (63.7 kg)   SpO2 98%   BMI 27.44 kg/m chart     Objective:    BP 114/62   Pulse 69   Temp 98 F (36.7 C)   Resp 16   Ht 5' (1.524 m)   Wt 140 lb 8 oz (63.7 kg)   SpO2 98%   BMI 27.44 kg/m    Physical Exam Vitals and nursing note reviewed.  Constitutional:      Appearance: She is well-developed and normal weight.  HENT:     Head:     Comments: Fluid in the ears bilaterally    Right Ear: Tympanic membrane and ear canal normal.     Left Ear: Tympanic membrane and ear canal normal.     Mouth/Throat:     Mouth: Mucous membranes are moist.  Cardiovascular:     Rate and Rhythm: Normal rate and regular rhythm.  Pulmonary:     Effort: Pulmonary effort is normal.     Breath sounds: Normal breath sounds.  Musculoskeletal:     Cervical back: Normal range of motion and neck supple.  Skin:    General: Skin is warm and dry.  Neurological:     General: No focal deficit present.     Mental Status: She is alert and oriented to person, place, and time.  Psychiatric:         Mood and Affect: Mood normal.        Behavior: Behavior normal.   Results for orders placed or performed in visit on 08/15/23  POCT rapid strep A  Result Value Ref Range   Rapid Strep A Screen Negative Negative        Assessment & Plan:   Problem List Items Addressed This Visit   None Visit Diagnoses     Sore throat    -  Primary   Relevant Orders   POCT rapid strep A (Completed)   Bacterial upper respiratory infection           Meds ordered this encounter  Medications  . amoxicillin (AMOXIL) 500 MG capsule    Sig: Take 1 capsule (500 mg total) by mouth 3 (three) times daily for 10 days.    Dispense:  30 capsule    Refill:  0  . promethazine-dextromethorphan (PROMETHAZINE-DM) 6.25-15 MG/5ML syrup    Sig: Take 5 mLs by mouth 4 (four) times daily as needed.    Dispense:  118 mL    Refill:  0  . methylPREDNISolone (MEDROL DOSEPAK) 4 MG TBPK tablet    Sig: As directed    Dispense:  21 tablet    Refill:  0   Call the office if symptoms worsen or persist.  Recheck as scheduled, and sooner as needed. No follow-ups on file.  Eulis Foster, FNP

## 2023-08-16 ENCOUNTER — Ambulatory Visit: Payer: BC Managed Care – PPO | Admitting: "Endocrinology

## 2023-08-16 ENCOUNTER — Ambulatory Visit (INDEPENDENT_AMBULATORY_CARE_PROVIDER_SITE_OTHER): Payer: BC Managed Care – PPO | Admitting: *Deleted

## 2023-08-16 ENCOUNTER — Encounter: Payer: Self-pay | Admitting: "Endocrinology

## 2023-08-16 VITALS — BP 98/62 | HR 72 | Ht 60.0 in | Wt 143.8 lb

## 2023-08-16 DIAGNOSIS — E890 Postprocedural hypothyroidism: Secondary | ICD-10-CM

## 2023-08-16 DIAGNOSIS — Z85850 Personal history of malignant neoplasm of thyroid: Secondary | ICD-10-CM

## 2023-08-16 DIAGNOSIS — J309 Allergic rhinitis, unspecified: Secondary | ICD-10-CM

## 2023-08-16 MED ORDER — SYNTHROID 88 MCG PO TABS: 88.0000 ug | ORAL_TABLET | Freq: Every day | ORAL | 3 refills | Status: DC

## 2023-08-16 NOTE — Progress Notes (Signed)
08/16/2023, 1:29 PM   Endocrinology follow-up note  Subjective:    Patient ID: Sarah Moran, female    DOB: [birth year], PCP Glori Luis, MD   Past Medical History:  Diagnosis Date   Allergy    Anal fissure    Anxiety    no meds   Chronic pelvic pain in female    Family history of breast cancer    My Risk neg 5/18   Family history of cervical cancer    Family history of skin cancer    Family history of throat cancer    Frequent headaches    Gallstone    Gallstones    Genetic testing of female 01/2017   My Risk/BRCA neg   GERD (gastroesophageal reflux disease)    well controlled. Takes gingerroot when symptoms   H. pylori infection 06/19/2018   Hypothyroidism    s/p thyroidectomy - levothyroxine 75 mcg daily   IBS (irritable bowel syndrome)    constipation - takes miralax daily   Increased risk of breast cancer 01/2017   IBIS=18.8%/riskscore=21.9%   Liver cyst 04/2018   Migraine    takes excedrin. She has tried imitrex in the past    Personal history of radiation therapy    Seizure (HCC) 1997    x 1-age 42-after surgery for scoliosis   Thyroid cancer (HCC) 2007   s/p thyroidectomy   Past Surgical History:  Procedure Laterality Date   ANAL RECTAL MANOMETRY N/A 10/12/2018   Procedure: ANO RECTAL MANOMETRY;  Surgeon: Napoleon Form, MD;  Location: [facility];  Service: Endoscopy;  Laterality: N/A;   ANTERIOR AND POSTERIOR REPAIR WITH SACROSPINOUS FIXATION N/A 08/02/2018   Procedure: ANTERIOR AND POSTERIOR REPAIR;  Surgeon: Natale Milch, MD;  Location: [facility];  Service: Gynecology;  Laterality: N/A;   BACK SURGERY  1997   scoliosis   CHOLECYSTECTOMY N/A 09/21/2018   Procedure: LAPAROSCOPIC CHOLECYSTECTOMY;  Surgeon: Duanne Guess, MD;  Location: [facility];  Service: General;  Laterality: N/A;   COLONOSCOPY     ESOPHAGOGASTRODUODENOSCOPY (EGD) WITH PROPOFOL N/A 06/12/2018   Procedure:  ESOPHAGOGASTRODUODENOSCOPY (EGD) WITH PROPOFOL;  Surgeon: Toney Reil, MD;  Location: [facility];  Service: Gastroenterology;  Laterality: N/A;   HERNIA REPAIR  1990   bilateral inguinal   PUBOVAGINAL SLING N/A 08/02/2018   Procedure: PUBO-VAGINAL SLING- TVT;  Surgeon: Natale Milch, MD;  Location: [facility];  Service: Gynecology;  Laterality: N/A;   THYROIDECTOMY  2007   thyroid cancer   TOTAL ABDOMINAL HYSTERECTOMY  2008   dymenorrhea   Social History   Socioeconomic History   Marital status: Married    Spouse name: [name]   Number of children: 2   Years of education: 12   Highest education level: Not on file  Occupational History   Occupation: Stay at home mom  Tobacco Use   Smoking status: Never   Smokeless tobacco: Never  Vaping Use   Vaping status: Never Used  Substance and Sexual Activity   Alcohol use: Not Currently   Drug use: No   Sexual activity: Yes    Birth control/protection: Surgical    Comment: Hysterectomy  Other Topics Concern   Not on file  Social  History Narrative   Alivea grew up in [place]. She currently lives in [place] with her husband, [name], and their 2 sons (Kane  and [place] ). 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 Denine belongs to the [employer] faith. She enjoys outdoor activities on her spare time.   Social Determinants of Health   Financial Resource Strain: Not on file  Food Insecurity: Not on file  Transportation Needs: Not on file  Physical Activity: Not on file  Stress: Not on file  Social Connections: Not on file   Family History  Problem Relation Age of Onset   Hyperlipidemia Father    Hypertension Father    Clotting disorder Sister    Hepatitis B Brother    Breast cancer Paternal Grandmother 94       with mets   Cervical cancer Mother        dx. in her early 43s   Breast cancer Paternal Aunt 50   Throat cancer Paternal Aunt 35   Breast  cancer Paternal Aunt 6   Breast cancer Paternal Aunt        84   Skin cancer Paternal Aunt    Cancer Cousin        unknown type dx. in her late 20s/early 30s   Skin cancer Cousin        13 cancerous moles removed in her 32s   Outpatient Encounter Medications as of 08/16/2023  Medication Sig   amoxicillin (AMOXIL) 500 MG capsule Take 1 capsule (500 mg total) by mouth 3 (three) times daily for 10 days.   baclofen (LIORESAL) 10 MG tablet Take 10 mg by mouth 2 (two) times daily as needed.   cetirizine (ZYRTEC ALLERGY) 10 MG tablet Take 10 mg by mouth.   Cholecalciferol (VITAMIN D3) 50 MCG (2000 UT) capsule Take 2,000 Units by mouth daily.   Cod Liver Oil CAPS Take 1 capsule by mouth at bedtime.    diclofenac Sodium (VOLTAREN) 1 % GEL Apply 2 g topically 4 (four) times daily.   EMGALITY 120 MG/ML SOAJ Inject into the skin every 30 (thirty) days.   EPINEPHrine (EPIPEN 2-PAK) 0.3 mg/0.3 mL IJ SOAJ injection Inject 0.3 mg into the muscle as needed for anaphylaxis.   Fexofenadine HCl (ALLEGRA ALLERGY PO) Take 1 tablet by mouth daily.   Fluticasone Propionate (XHANCE) 93 MCG/ACT EXHU Place 1-2 puffs into the nose daily as needed.   levocetirizine (XYZAL) 5 MG tablet TAKE 1 TABLET BY MOUTH EVERY DAY IN THE EVENING   methylPREDNISolone (MEDROL DOSEPAK) 4 MG TBPK tablet As directed   Omega-3 Fatty Acids (FISH OIL PO) Take by mouth.   polyethylene glycol (MIRALAX / GLYCOLAX) 17 g packet Take 17 g by mouth daily.   promethazine-dextromethorphan (PROMETHAZINE-DM) 6.25-15 MG/5ML syrup Take 5 mLs by mouth 4 (four) times daily as needed.   SYNTHROID 88 MCG tablet Take 1 tablet (88 mcg total) by mouth daily before breakfast.   Turmeric 400 MG CAPS Take by mouth.   [DISCONTINUED] SYNTHROID 88 MCG tablet Take 1 tablet (88 mcg total) by mouth daily before breakfast.   No facility-administered encounter medications on file as of 08/16/2023.   ALLERGIES: Allergies  Allergen Reactions   Darvon  [Propoxyphene] Other (See Comments)    Hallucinations    Onion Other (See Comments)    Migraines   Percocet [Oxycodone-Acetaminophen] Hives   Vicodin [Hydrocodone-Acetaminophen] Hives    VACCINATION STATUS: Immunization History  Administered Date(s)  Administered   DTaP 10/03/2011   Hepb-cpg 01/26/2022, 03/07/2022   Influenza,inj,Quad PF,6+ Mos 08/15/2018, 06/19/2019, 07/01/2020, 07/27/2021, 05/11/2022   Influenza-Unspecified 06/19/2019   PFIZER(Purple Top)SARS-COV-2 Vaccination 12/23/2019, 01/15/2020   Tdap 04/01/2016    HPI Tanice D Sweitzer is 42 y.o. female who presents today with a medical history as above. she is being seen in follow-up after she was seen in consultation for history of thyroid cancer requested by Glori Luis, MD.   She was diagnosed with thyroid cancer in 2007, was operated on by Dr. Jenne Campus.  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  She received 99.7 mCi of I-131 on November 11, 2005 at [facility]. I do not see a whole-body scan in her records.    Her most recent 3 measurements of unstimulated thyroglobulin levels are  undetectable. -Her recent  thyroid/neck  ultrasound is negative for any significant findings. She has been given various dose of Synthroid over the years.  She is currently on Synthroid 88 mcg p.o. daily before breakfast.  She has no new complaints today.    She denies family history of thyroid malignancy.  She reports that she does not tolerate generic levothyroxine.  She denies dysphagia, shortness of breath, nor voice change. Does not palpitations, tremors, nor heat intolerance.  Review of Systems  Constitutional: + Minimally fluctuating body weight, no fatigue, no subjective hyperthermia, no subjective hypothermia Eyes: no blurry vision, no xerophthalmia ENT: no sore throat, no nodules palpated in throat, no  dysphagia/odynophagia, no hoarseness   Objective:       08/16/2023   12:59 PM 08/15/2023    1:01 PM 05/17/2023    1:44 PM  Vitals with BMI  Height 5\' 0"  5\' 0"  5\' 0"   Weight 143 lbs 13 oz 140 lbs 8 oz 137 lbs 10 oz  BMI 28.08 27.44 26.87  Systolic 98 114 120  Diastolic 62 62 76  Pulse 72 69 97    BP 98/62   Pulse 72   Ht 5' (1.524 m)   Wt 143 lb 12.8 oz (65.2 kg)   BMI 28.08 kg/m   Wt Readings from Last 3 Encounters:  08/16/23 143 lb 12.8 oz (65.2 kg)  08/15/23 140 lb 8 oz (63.7 kg)  05/17/23 137 lb 9.6 oz (62.4 kg)    Physical Exam  Constitutional:  Body mass index is 28.08 kg/m.,  not in acute distress, normal state of mind Eyes: PERRLA, EOMI, no exophthalmos ENT: moist mucous membranes,+ old thyroidectomy scar in anterior lower neck.  no gross cervical lymphadenopathy   CMP ( most recent) CMP     Component Value Date/Time   NA 138 08/18/2022 0914   NA 142 12/23/2021 0000   NA 140 12/06/2012 1056   K 4.2 08/18/2022 0914   K 3.9 12/06/2012 1056   CL 105 08/18/2022 0914   CL 106 12/06/2012 1056   CO2 28 08/18/2022 0914   CO2 29 12/06/2012 1056   GLUCOSE 88 08/18/2022 0914   GLUCOSE 90 12/06/2012 1056   BUN 10 08/18/2022 0914   BUN 7 12/23/2021 0000   BUN 8 12/06/2012 1056   CREATININE 0.66 08/18/2022 0914   CREATININE 0.79 12/06/2012 1056   CALCIUM 8.8 08/18/2022 0914   CALCIUM 8.7 12/06/2012 1056   PROT 6.4 08/18/2022 0914   PROT 7.2 12/06/2012 1056   ALBUMIN 4.1 08/18/2022 0914   ALBUMIN 3.5 12/06/2012 1056  AST 13 08/18/2022 0914   AST 18 12/06/2012 1056   ALT 7 08/18/2022 0914   ALT 17 12/06/2012 1056   ALKPHOS 53 08/18/2022 0914   ALKPHOS 56 12/06/2012 1056   BILITOT 0.6 08/18/2022 0914   BILITOT 0.2 12/06/2012 1056   GFRNONAA >60 07/24/2020 1118   GFRNONAA >60 12/06/2012 1056   GFRAA >60 05/19/2020 0640   GFRAA >60 12/06/2012 1056     Diabetic Labs (most recent): Lab Results  Component Value Date   HGBA1C 5.2 02/17/2023   HGBA1C  5.7 08/18/2022   HGBA1C 5.9 07/27/2021     Lipid Panel ( most recent) Lipid Panel     Component Value Date/Time   CHOL 207 (H) 08/18/2022 0914   TRIG 122.0 08/18/2022 0914   HDL 49.80 08/18/2022 0914   CHOLHDL 4 08/18/2022 0914   VLDL 24.4 08/18/2022 0914   LDLCALC 132 (H) 08/18/2022 0914      Lab Results  Component Value Date   TSH 0.847 08/07/2023   TSH 0.489 01/23/2023   TSH 0.287 (L) 11/11/2022   TSH 2.450 08/01/2022   TSH 1.58 05/11/2022   TSH 0.25 (L) 03/07/2022   TSH 0.03 Repeated and verified X2. (L) 01/24/2022   TSH 1.00 03/25/2021   TSH 0.75 02/18/2021   TSH 4.56 (H) 12/31/2020   FREET4 1.42 08/07/2023   FREET4 1.68 01/23/2023   FREET4 1.57 11/11/2022   FREET4 1.40 08/01/2022   FREET4 1.08 03/25/2021   FREET4 1.53 07/31/2020   FREET4 1.47 (H) 07/24/2020   FREET4 1.11 07/01/2020   FREET4 1.32 05/13/2020   FREET4 1.72 (H) 03/25/2020        Thyroid/neck ultrasound on July 28, 2022 FINDINGS: Isthmus: Surgically absent. There is no residual nodular soft tissue within the isthmic resection bed.   Right lobe: Surgically absent. There is no residual nodular soft tissue within the right lobectomy resection bed.   Left lobe: Surgically absent. There is no residual nodular soft tissue within the left lobectomy resection bed.    Assessment & Plan:   1. Postsurgical hypothyroidism 2. History of thyroid cancer   - I have reviewed her new and available thyroid records and clinically evaluated the patient. - Based on these reviews, she has history of papillary thyroid cancer status postsurgery and radioactive iodine thyroid remnant ablation in 2007.  This completes her initial intensive treatment for differentiated thyroid cancer.   Her recent surveillance thyroid/neck ultrasound was without any evidence of tumor recurrence or relapse.  Her 3 recent measurements of unstimulated thyroglobulin are undetectable. -She is considered excellent responder with low  risk of thyroid cancer recurrence.  Regarding her postsurgical hypothyroidism, she is currently on Synthroid 88 mcg p.o. daily before breakfast breakfast.  She is tolerating and responding to this medication.     - We discussed about the correct intake of her thyroid hormone, on empty stomach at fasting, with water, separated by at least 30 minutes from breakfast and other medications,  and separated by more than 4 hours from calcium, iron, multivitamins, acid reflux medications (PPIs). -Patient is made aware of the fact that thyroid hormone replacement is needed for life, dose to be adjusted by periodic monitoring of thyroid function tests.   Regarding her weight concern: I discussed and recommended whole food plant-based diet with her.   - she is advised to maintain close follow up with [facility] Yehuda Mao, MD for primary care needs.   I spent  22  minutes in the care of the patient  today including review of labs from Thyroid Function, CMP, and other relevant labs ; imaging/biopsy records (current and previous including abstractions from other facilities); face-to-face time discussing  her lab results and symptoms, medications doses, her options of short and long term treatment based on the latest standards of care / guidelines;   and documenting the encounter.  Anwen D Baldinger  participated in the discussions, expressed understanding, and voiced agreement with the above plans.  All questions were answered to her satisfaction. she is encouraged to contact clinic should she have any questions or concerns prior to her return visit.   Follow up plan: Return in about 6 months (around 02/13/2024).   Marquis Lunch, MD [facility] [address] [place], [place] [address] Phone: [phone]  Fax: [phone]    08/16/2023, 1:29 PM  This note was partially dictated with voice recognition software. Similar sounding words can be transcribed  inadequately or may not  be corrected upon review.

## 2023-08-22 ENCOUNTER — Encounter: Payer: Self-pay | Admitting: Family Medicine

## 2023-08-22 ENCOUNTER — Ambulatory Visit (INDEPENDENT_AMBULATORY_CARE_PROVIDER_SITE_OTHER): Payer: BC Managed Care – PPO | Admitting: Family Medicine

## 2023-08-22 VITALS — BP 118/68 | HR 75 | Temp 98.3°F | Ht 60.0 in | Wt 139.2 lb

## 2023-08-22 DIAGNOSIS — M25551 Pain in right hip: Secondary | ICD-10-CM | POA: Diagnosis not present

## 2023-08-22 DIAGNOSIS — E785 Hyperlipidemia, unspecified: Secondary | ICD-10-CM

## 2023-08-22 DIAGNOSIS — J0140 Acute pansinusitis, unspecified: Secondary | ICD-10-CM | POA: Insufficient documentation

## 2023-08-22 DIAGNOSIS — Z0001 Encounter for general adult medical examination with abnormal findings: Secondary | ICD-10-CM | POA: Diagnosis not present

## 2023-08-22 DIAGNOSIS — M25461 Effusion, right knee: Secondary | ICD-10-CM | POA: Diagnosis not present

## 2023-08-22 DIAGNOSIS — Z23 Encounter for immunization: Secondary | ICD-10-CM | POA: Diagnosis not present

## 2023-08-22 DIAGNOSIS — Z1239 Encounter for other screening for malignant neoplasm of breast: Secondary | ICD-10-CM

## 2023-08-22 DIAGNOSIS — R7303 Prediabetes: Secondary | ICD-10-CM | POA: Diagnosis not present

## 2023-08-22 LAB — COMPREHENSIVE METABOLIC PANEL
ALT: 9 U/L (ref 0–35)
AST: 14 U/L (ref 0–37)
Albumin: 4.3 g/dL (ref 3.5–5.2)
Alkaline Phosphatase: 46 U/L (ref 39–117)
BUN: 7 mg/dL (ref 6–23)
CO2: 31 meq/L (ref 19–32)
Calcium: 8.9 mg/dL (ref 8.4–10.5)
Chloride: 100 meq/L (ref 96–112)
Creatinine, Ser: 0.69 mg/dL (ref 0.40–1.20)
GFR: 105.6 mL/min (ref >60.00)
Glucose, Bld: 83 mg/dL (ref 70–99)
Potassium: 3.7 meq/L (ref 3.5–5.1)
Sodium: 137 meq/L (ref 135–145)
Total Bilirubin: 0.5 mg/dL (ref 0.2–1.2)
Total Protein: 7.3 g/dL (ref 6.0–8.3)

## 2023-08-22 LAB — LIPID PANEL
Cholesterol: 241 mg/dL — ABNORMAL HIGH (ref 0–200)
HDL: 44.3 mg/dL (ref >39.00)
LDL Cholesterol: 161 mg/dL — ABNORMAL HIGH (ref 0–99)
NonHDL: 196.79
Total CHOL/HDL Ratio: 5
Triglycerides: 178 mg/dL — ABNORMAL HIGH (ref 0.0–149.0)
VLDL: 35.6 mg/dL (ref 0.0–40.0)

## 2023-08-22 LAB — HEMOGLOBIN A1C: Hgb A1c MFr Bld: 5.7 % (ref 4.6–6.5)

## 2023-08-22 MED ORDER — FLUCONAZOLE 150 MG PO TABS: 150.0000 mg | ORAL_TABLET | ORAL | 0 refills | Status: AC

## 2023-08-22 MED ORDER — DOXYCYCLINE HYCLATE 100 MG PO TABS: 100.0000 mg | ORAL_TABLET | Freq: Two times a day (BID) | ORAL | 0 refills | Status: DC

## 2023-08-22 NOTE — Assessment & Plan Note (Signed)
Improved with rest.  Discussed this could be arthritic related though she could also have strained something.  Encouraged additional rest for 2-3 more weeks and if symptoms recur we could always have her see orthopedics.

## 2023-08-22 NOTE — Progress Notes (Signed)
Marikay Alar, MD Phone: [phone]  Sarah Moran is a 42 y.o. female who presents today for CPE.  Diet: Notes diet has not been good, she is trying to cut down on sweets and carbohydrate intake Exercise: Intermittently goes to the gym 4-5 times a week and does cardio and weights Pap smear: Status post total hysterectomy Colonoscopy: Discussed this would be due when she turns 43 Mammogram: Up-to-date, due for MRI given elevated breast cancer risk on prior screening Family history-  Colon cancer: No  Breast cancer: Grandmother and 3 paternal aunts  Ovarian cancer: Mother Menses: Status post hysterectomy Vaccines-   Flu: Due  Tetanus: Up-to-date  COVID19: Due for updated vaccine HIV screening: Up-to-date Hep C Screening: Up-to-date Tobacco use: No Alcohol use: Occasionally has 1-2 beverages Illicit Drug use: No Dentist: Yes Ophthalmology: Yes  Sinus infection: Patient notes sinus symptoms going on 2 weeks.  Notes has been on amoxicillin and prednisone over the last week.  She has had some improvement though is still blowing out significant amounts of mucus.  She had sore throat though that has improved.  Right knee and hip issues: Patient notes right knee swelling that started after dancing.  Recurs when she does physical activity.  No pain associated with her knee.  She notes some right hip pain at times as well.  She notes the symptoms have improved over the last several weeks given that she has not been physically active.   Active Ambulatory Problems    Diagnosis Date Noted   Migraine 09/23/2013   History of thyroid cancer 06/27/2014   Hypothyroidism 06/27/2014   Family history of breast cancer 12/15/2016   Hyperlipidemia 05/02/2017   Muscle ache 04/23/2018   Paresthesia 04/23/2018   Anxiety and depression 04/23/2018   Tension headache 04/23/2018   Dyspepsia    Prolapse of female pelvic organs 08/02/2018   Rectocele 08/02/2018   Calculus of gallbladder without  cholecystitis without obstruction    Allergic rhinitis 05/06/2019   Arthralgia 09/30/2019   Family history of skin cancer    Family history of cervical cancer    Family history of throat cancer    ANA positive 11/06/2019   Trapezius strain 11/06/2020   Sleeping difficulty 11/06/2020   Seasonal and perennial allergic rhinitis 06/04/2021   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   Vertigo 07/19/2021   History of ovarian cyst 01/24/2022   Chronic pain of left ankle 01/24/2022   Need for hepatitis B screening test 01/24/2022   Peroneal tendinitis, left 01/27/2022   Supraumbilical hernia 02/16/2022   Neutrophilia 03/25/2022   Rash of mouth present on examination 03/25/2022   Depression, major, single episode, 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   Decreased libido 05/11/2022   Gross hematuria 08/18/2022   Prediabetes 08/18/2022   Overweight 02/17/2023   Breast tenderness in female 02/17/2023   Encounter for general adult medical examination with abnormal findings 08/22/2023   Right hip pain 08/22/2023   Swelling of joint of right knee 08/22/2023   Acute non-recurrent pansinusitis 08/22/2023   Resolved Ambulatory Problems    Diagnosis Date Noted   Microscopic hematuria 10/24/2013   Sinusitis 11/13/2013   Urinary frequency 06/27/2014   Acute bronchitis 10/07/2014   Muscle strain of right upper back 12/10/2014   Sinus pressure 04/22/2015   Dysuria 07/22/2015   Routine general medical examination at a health care facility 04/01/2016   H. pylori infection 06/19/2018   Breast pain, right 08/16/2018   Constipation    Left elbow pain 11/06/2019   Medication  side effect 07/31/2020   Subcutaneous nodule 09/24/2020   Neck pain 11/19/2020   Tremor 11/19/2020   Vaginal discharge 05/11/2022   Postsurgical hypothyroidism 06/08/2022   Past Medical History:  Diagnosis Date   Allergy    Anal fissure    Anxiety    Chronic pelvic pain in female    Frequent headaches    Gallstone    Gallstones     Genetic testing of female 01/2017   GERD (gastroesophageal reflux disease)    IBS (irritable bowel syndrome)    Increased risk of breast cancer 01/2017   Liver cyst 04/2018   Personal history of radiation therapy    Seizure (HCC) 1997   Thyroid cancer (HCC) 2007    Family History  Problem Relation Age of Onset   Hyperlipidemia Father    Hypertension Father    Clotting disorder Sister    Hepatitis B Brother    Breast cancer Paternal Grandmother 22       with mets   Cervical cancer Mother        dx. in her early 88s   Breast cancer Paternal Aunt 50   Throat cancer Paternal Aunt 51   Breast cancer Paternal Aunt 79   Breast cancer Paternal Aunt        24   Skin cancer Paternal Aunt    Cancer Cousin        unknown type dx. in her late 20s/early 30s   Skin cancer Cousin        13 cancerous moles removed in her 84s    Social History   Socioeconomic History   Marital status: Married    Spouse name: [name]   Number of children: 2   Years of education: 12   Highest education level: Not on file  Occupational History   Occupation: Stay at home mom  Tobacco Use   Smoking status: Never   Smokeless tobacco: Never  Vaping Use   Vaping status: Never Used  Substance and Sexual Activity   Alcohol use: Not Currently   Drug use: No   Sexual activity: Yes    Birth control/protection: Surgical    Comment: Hysterectomy  Other Topics Concern   Not on file  Social History Narrative   Aireanna grew up in [place]. She currently lives in [place] with her husband, [name], and their 2 sons ([place]  and [place] ). 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 Allycia belongs to the [employer] faith. She enjoys outdoor activities on her spare time.   Social Determinants of Health   Financial Resource Strain: Not on file  Food Insecurity: Not on file  Transportation Needs: Not on file  Physical Activity: Not on file  Stress: Not on file   Social Connections: Not on file  Intimate Partner Violence: Not on file    ROS  General:  Negative for nexplained weight loss, fever Skin: Negative for new or changing mole, sore that won't heal HEENT: Negative for trouble hearing, trouble seeing, ringing in ears, mouth sores, hoarseness, change in voice, dysphagia. CV:  Negative for chest pain, dyspnea, edema, palpitations Resp: Negative for cough, dyspnea, hemoptysis GI: Negative for nausea, vomiting, diarrhea, constipation, abdominal pain, melena, hematochezia. GU: Negative for dysuria, incontinence, urinary hesitance, hematuria, vaginal or penile discharge, polyuria, sexual difficulty, lumps in testicle or breasts MSK: Negative for muscle cramps or aches, joint pain or swelling Neuro: Negative for headaches, weakness, numbness, dizziness, passing out/fainting  Psych: Negative for depression, anxiety, memory problems  Objective  Physical Exam Vitals:   08/22/23 0810  BP: 118/68  Pulse: 75  Temp: 98.3 F (36.8 C)  SpO2: 99%    BP Readings from Last 3 Encounters:  08/22/23 118/68  08/16/23 98/62  08/15/23 114/62   Wt Readings from Last 3 Encounters:  08/22/23 139 lb 3.2 oz (63.1 kg)  08/16/23 143 lb 12.8 oz (65.2 kg)  08/15/23 140 lb 8 oz (63.7 kg)    Physical Exam Constitutional:      General: She is not in acute distress.    Appearance: She is not diaphoretic.  HENT:     Head: Normocephalic and atraumatic.     Right Ear: Tympanic membrane and ear canal normal.     Left Ear: Tympanic membrane and ear canal normal.  Cardiovascular:     Rate and Rhythm: Normal rate and regular rhythm.     Heart sounds: Normal heart sounds.  Pulmonary:     Effort: Pulmonary effort is normal.     Breath sounds: Normal breath sounds.  Abdominal:     General: Bowel sounds are normal. There is no distension.     Palpations: Abdomen is soft.     Tenderness: There is no abdominal tenderness.  Musculoskeletal:     Right lower leg:  No edema.     Left lower leg: No edema.     Comments: Right knee with no swelling or tenderness, negative McMurray's right knee, good internal and external range of motion right hip, no tenderness laterally of the right hip  Lymphadenopathy:     Cervical: No cervical adenopathy.  Skin:    General: Skin is warm and dry.  Neurological:     Mental Status: She is alert.  Psychiatric:        Mood and Affect: Mood normal.      Assessment/Plan:   Encounter for general adult medical examination with abnormal findings Assessment & Plan: Physical exam completed.  Encouraged healthy diet and exercise.  Breast MRI ordered given that she is high risk for breast cancer.  Flu vaccine given today.  Encouraged to get the updated COVID vaccination.  Lab work as outlined.  Discussed colonoscopy would be due when she turns 43.   Hyperlipidemia, unspecified hyperlipidemia type -     Comprehensive metabolic panel -     Lipid panel  Prediabetes -     Hemoglobin A1c  Breast cancer screening, high risk patient -     MR BREAST BILATERAL W WO CONTRAST INC CAD; Future  Acute non-recurrent pansinusitis Assessment & Plan: Persistent symptoms despite treatment with amoxicillin.  Will switch to doxycycline 100 mg twice daily for 7 days.  Discussed risk of diarrhea and skin sensitivity of the sun.  Advised to stop the amoxicillin.  Diflucan sent in in case she develops a yeast infection.  If not improving she will let us know.  Orders: -     Doxycycline Hyclate; Take 1 tablet (100 mg total) by mouth 2 (two) times daily.  Dispense: 14 tablet; Refill: 0  Encounter for administration of vaccine -     Flu vaccine trivalent PF, 6mos and older(Flulaval,Afluria,Fluarix,Fluzone)  Right hip pain Assessment & Plan: Improved with rest.  Discussed this could be arthritic related though she could also have strained something.  Encouraged additional rest for 2-3 more weeks and if symptoms recur we could always have her  see orthopedics.   Swelling of joint of right knee Assessment & Plan:  Improved.  Possibly arthritic related.  Patient will monitor with continued rest.  If it recurs she will let us know we could have her see orthopedics.   Other orders -     Fluconazole; Take 1 tablet (150 mg total) by mouth every 3 (three) days for 2 doses.  Dispense: 2 tablet; Refill: 0    Return in about 6 months (around 02/20/2024) for transfer care.   Marikay Alar, MD [facility]

## 2023-08-22 NOTE — Patient Instructions (Signed)
Nice to see you. We will contact you with your lab results.  Someone should contact you to schedule the MRI of your breast. Please stop the amoxicillin and start the doxycycline.

## 2023-08-22 NOTE — Assessment & Plan Note (Signed)
Persistent symptoms despite treatment with amoxicillin.  Will switch to doxycycline 100 mg twice daily for 7 days.  Discussed risk of diarrhea and skin sensitivity of the sun.  Advised to stop the amoxicillin.  Diflucan sent in in case she develops a yeast infection.  If not improving she will let us know.

## 2023-08-22 NOTE — Assessment & Plan Note (Signed)
Improved.  Possibly arthritic related.  Patient will monitor with continued rest.  If it recurs she will let us know we could have her see orthopedics.

## 2023-08-22 NOTE — Assessment & Plan Note (Signed)
Physical exam completed.  Encouraged healthy diet and exercise.  Breast MRI ordered given that she is high risk for breast cancer.  Flu vaccine given today.  Encouraged to get the updated COVID vaccination.  Lab work as outlined.  Discussed colonoscopy would be due when she turns 43.

## 2023-08-25 ENCOUNTER — Ambulatory Visit (INDEPENDENT_AMBULATORY_CARE_PROVIDER_SITE_OTHER): Payer: Self-pay

## 2023-08-25 ENCOUNTER — Ambulatory Visit: Payer: BC Managed Care – PPO | Admitting: Physician Assistant

## 2023-08-25 DIAGNOSIS — J309 Allergic rhinitis, unspecified: Secondary | ICD-10-CM | POA: Diagnosis not present

## 2023-08-28 ENCOUNTER — Ambulatory Visit
Admission: RE | Admit: 2023-08-28 | Discharge: 2023-08-28 | Disposition: A | Discharge status: Home / Self Care | Payer: BC Managed Care – PPO | Source: Ambulatory Visit | Attending: Family Medicine | Admitting: Family Medicine

## 2023-08-28 DIAGNOSIS — N6002 Solitary cyst of left breast: Secondary | ICD-10-CM | POA: Diagnosis not present

## 2023-08-28 DIAGNOSIS — R9230 Dense breasts, unspecified: Secondary | ICD-10-CM | POA: Diagnosis not present

## 2023-08-28 DIAGNOSIS — Z1239 Encounter for other screening for malignant neoplasm of breast: Secondary | ICD-10-CM | POA: Insufficient documentation

## 2023-08-28 DIAGNOSIS — N6001 Solitary cyst of right breast: Secondary | ICD-10-CM | POA: Diagnosis not present

## 2023-08-28 MED ORDER — GADOBUTROL 1 MMOL/ML IV SOLN
6.0000 mL | Freq: Once | INTRAVENOUS | Status: AC | PRN
  Administered 2023-08-28: 6 mL via INTRAVENOUS

## 2023-08-30 ENCOUNTER — Ambulatory Visit (INDEPENDENT_AMBULATORY_CARE_PROVIDER_SITE_OTHER): Payer: Self-pay

## 2023-08-30 DIAGNOSIS — J309 Allergic rhinitis, unspecified: Secondary | ICD-10-CM

## 2023-09-04 DIAGNOSIS — M542 Cervicalgia: Secondary | ICD-10-CM | POA: Diagnosis not present

## 2023-09-04 DIAGNOSIS — G43719 Chronic migraine without aura, intractable, without status migrainosus: Secondary | ICD-10-CM | POA: Diagnosis not present

## 2023-09-08 ENCOUNTER — Ambulatory Visit (INDEPENDENT_AMBULATORY_CARE_PROVIDER_SITE_OTHER): Payer: BC Managed Care – PPO

## 2023-09-08 ENCOUNTER — Ambulatory Visit: Payer: BC Managed Care – PPO | Admitting: Physician Assistant

## 2023-09-08 VITALS — BP 104/72 | HR 96

## 2023-09-08 DIAGNOSIS — N3946 Mixed incontinence: Secondary | ICD-10-CM | POA: Diagnosis not present

## 2023-09-08 DIAGNOSIS — R3129 Other microscopic hematuria: Secondary | ICD-10-CM | POA: Diagnosis not present

## 2023-09-08 DIAGNOSIS — J309 Allergic rhinitis, unspecified: Secondary | ICD-10-CM | POA: Diagnosis not present

## 2023-09-08 DIAGNOSIS — N3281 Overactive bladder: Secondary | ICD-10-CM

## 2023-09-08 LAB — URINALYSIS, COMPLETE
Bilirubin, UA: NEGATIVE
Glucose, UA: NEGATIVE
Ketones, UA: NEGATIVE
Leukocytes,UA: NEGATIVE
Nitrite, UA: NEGATIVE
Protein,UA: NEGATIVE
Specific Gravity, UA: 1.01 (ref 1.005–1.030)
Urobilinogen, Ur: 0.2 mg/dL (ref 0.2–1.0)
pH, UA: 7 (ref 5.0–7.5)

## 2023-09-08 LAB — MICROSCOPIC EXAMINATION

## 2023-09-08 NOTE — Progress Notes (Unsigned)
09/08/2023 10:12 AM   Sarah Moran [birth year] [id number]  CC: Chief Complaint  Patient presents with   Follow-up   HPI: Sarah Moran is a 42 y.o. female with PMH hematuria with benign cystoscopy with Dr. Apolinar Junes last year who presents today for annual follow-up.   Today she reports no episodes of gross hematuria over the past year.  She reports chronic, stable stress and urge incontinence as well as urinary frequency.  She wears panty liners for this.  She is minimally bothered by it and would prefer to defer pharmacotherapy.  In-office UA today positive for 2+ blood; urine microscopy with 3-10 RBCs/HPF and moderate bacteria.   PMH: Past Medical History:  Diagnosis Date   Allergy    Anal fissure    Anxiety    no meds   Chronic pelvic pain in female    Family history of breast cancer    My Risk neg 5/18   Family history of cervical cancer    Family history of skin cancer    Family history of throat cancer    Frequent headaches    Gallstone    Gallstones    Genetic testing of female 01/2017   My Risk/BRCA neg   GERD (gastroesophageal reflux disease)    well controlled. Takes gingerroot when symptoms   H. pylori infection 06/19/2018   Hypothyroidism    s/p thyroidectomy - levothyroxine 75 mcg daily   IBS (irritable bowel syndrome)    constipation - takes miralax daily   Increased risk of breast cancer 01/2017   IBIS=18.8%/riskscore=21.9%   Liver cyst 04/2018   Migraine    takes excedrin. She has tried imitrex in the past    Personal history of radiation therapy    Seizure (HCC) 1997    x 1-age 83-after surgery for scoliosis   Thyroid cancer (HCC) 2007   s/p thyroidectomy    Surgical History: Past Surgical History:  Procedure Laterality Date   ANAL RECTAL MANOMETRY N/A 10/12/2018   Procedure: ANO RECTAL MANOMETRY;  Surgeon: Napoleon Form, MD;  Location: [facility];  Service: Endoscopy;  Laterality: N/A;   ANTERIOR AND POSTERIOR REPAIR WITH  SACROSPINOUS FIXATION N/A 08/02/2018   Procedure: ANTERIOR AND POSTERIOR REPAIR;  Surgeon: Natale Milch, MD;  Location: [facility];  Service: Gynecology;  Laterality: N/A;   BACK SURGERY  1997   scoliosis   CHOLECYSTECTOMY N/A 09/21/2018   Procedure: LAPAROSCOPIC CHOLECYSTECTOMY;  Surgeon: Duanne Guess, MD;  Location: [facility];  Service: General;  Laterality: N/A;   COLONOSCOPY     ESOPHAGOGASTRODUODENOSCOPY (EGD) WITH PROPOFOL N/A 06/12/2018   Procedure: ESOPHAGOGASTRODUODENOSCOPY (EGD) WITH PROPOFOL;  Surgeon: Toney Reil, MD;  Location: [facility];  Service: Gastroenterology;  Laterality: N/A;   HERNIA REPAIR  1990   bilateral inguinal   PUBOVAGINAL SLING N/A 08/02/2018   Procedure: PUBO-VAGINAL SLING- TVT;  Surgeon: Natale Milch, MD;  Location: [facility];  Service: Gynecology;  Laterality: N/A;   THYROIDECTOMY  2007   thyroid cancer   TOTAL ABDOMINAL HYSTERECTOMY  2008   dymenorrhea    Home Medications:  Allergies as of 09/08/2023       Reactions   Darvon [propoxyphene] Other (See Comments)   Hallucinations   Onion Other (See Comments)   Migraines   Percocet [oxycodone-acetaminophen] Hives   Vicodin [hydrocodone-acetaminophen] Hives        Medication List        Accurate as of September 08, 2023 10:12 AM. If you have any  questions, ask your nurse or doctor.          STOP taking these medications    doxycycline 100 MG tablet Commonly known as: VIBRA-TABS Stopped by: Carman Ching   methylPREDNISolone 4 MG Tbpk tablet Commonly known as: MEDROL DOSEPAK Stopped by: Carman Ching   promethazine-dextromethorphan 6.25-15 MG/5ML syrup Commonly known as: PROMETHAZINE-DM Stopped by: Carman Ching       TAKE these medications    ALLEGRA ALLERGY PO Take 1 tablet by mouth daily.   baclofen 10 MG tablet Commonly known as: LIORESAL Take 10 mg by mouth 2 (two) times daily as needed.   Cod Liver Oil Caps Take  1 capsule by mouth at bedtime.   diclofenac Sodium 1 % Gel Commonly known as: VOLTAREN Apply 2 g topically 4 (four) times daily.   EPINEPHrine 0.3 mg/0.3 mL Soaj injection Commonly known as: EpiPen 2-Pak Inject 0.3 mg into the muscle as needed for anaphylaxis.   FISH OIL PO Take by mouth.   levocetirizine 5 MG tablet Commonly known as: XYZAL TAKE 1 TABLET BY MOUTH EVERY DAY IN THE EVENING   polyethylene glycol 17 g packet Commonly known as: MIRALAX / GLYCOLAX Take 17 g by mouth daily.   Synthroid 88 MCG tablet Generic drug: levothyroxine Take 1 tablet (88 mcg total) by mouth daily before breakfast.   Vitamin D3 50 MCG (2000 UT) capsule Take 2,000 Units by mouth daily.   Xhance 93 MCG/ACT Exhu Generic drug: Fluticasone Propionate Place 1-2 puffs into the nose daily as needed.   ZyrTEC Allergy 10 MG tablet Generic drug: cetirizine Take 10 mg by mouth.        Allergies:  Allergies  Allergen Reactions   Darvon [Propoxyphene] Other (See Comments)    Hallucinations    Onion Other (See Comments)    Migraines   Percocet [Oxycodone-Acetaminophen] Hives   Vicodin [Hydrocodone-Acetaminophen] Hives    Family History: Family History  Problem Relation Age of Onset   Hyperlipidemia Father    Hypertension Father    Clotting disorder Sister    Hepatitis B Brother    Breast cancer Paternal Grandmother 20       with mets   Cervical cancer Mother        dx. in her early 14s   Breast cancer Paternal Aunt 50   Throat cancer Paternal Aunt 40   Breast cancer Paternal Aunt 38   Breast cancer Paternal Aunt        71   Skin cancer Paternal Aunt    Cancer Cousin        unknown type dx. in her late 20s/early 30s   Skin cancer Cousin        13 cancerous moles removed in her 52s    Social History:   reports that she has never smoked. She has never used smokeless tobacco. She reports that she does not currently use alcohol. She reports that she does not use  drugs.  Physical Exam: BP 104/72   Pulse 96   Constitutional:  Alert and oriented, no acute distress, nontoxic appearing HEENT: [place], AT Cardiovascular: No clubbing, cyanosis, or edema Respiratory: Normal respiratory effort, no increased work of breathing Skin: No rashes, bruises or suspicious lesions Neurologic: Grossly intact, no focal deficits, moving all 4 extremities Psychiatric: Normal mood and affect  Laboratory Data: Results for orders placed or performed in visit on 08/22/23  Comp Met (CMET)   Collection Time: 08/22/23  8:46 AM  Result Value Ref Range  Sodium 137 135 - 145 mEq/L   Potassium 3.7 3.5 - 5.1 mEq/L   Chloride 100 96 - 112 mEq/L   CO2 31 19 - 32 mEq/L   Glucose, Bld 83 70 - 99 mg/dL   BUN 7 6 - 23 mg/dL   Creatinine, Ser 1.61 0.40 - 1.20 mg/dL   Total Bilirubin 0.5 0.2 - 1.2 mg/dL   Alkaline Phosphatase 46 39 - 117 U/L   AST 14 0 - 37 U/L   ALT 9 0 - 35 U/L   Total Protein 7.3 6.0 - 8.3 g/dL   Albumin 4.3 3.5 - 5.2 g/dL   GFR 096.04 >54.09 mL/min   Calcium 8.9 8.4 - 10.5 mg/dL  Lipid panel   Collection Time: 08/22/23  8:46 AM  Result Value Ref Range   Cholesterol 241 (H) 0 - 200 mg/dL   Triglycerides 811.9 (H) 0.0 - 149.0 mg/dL   HDL 14.78 >29.56 mg/dL   VLDL 21.3 0.0 - 08.6 mg/dL   LDL Cholesterol 578 (H) 0 - 99 mg/dL   Total CHOL/HDL Ratio 5    NonHDL 196.79   HgB A1c   Collection Time: 08/22/23  8:46 AM  Result Value Ref Range   Hgb A1c MFr Bld 5.7 4.6 - 6.5 %   Assessment & Plan:   1. Microscopic hematuria (Primary) Persistent microscopic hematuria on UA today.  I offered her CTU and she agreed, will call with results when available.  Will plan for annual follow-up barring any significant findings on CTU. - Urinalysis, Complete - CT HEMATURIA WORKUP; Future  2. 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  She is minimally bothered by this and prefers to defer intervention at this  time, though may consider pharmacotherapy, third line therapies, and pelvic floor PT in the future per patient preference.  Return for Will call with results.  Carman Ching, PA-C  [facility] [place] [address] [place], [place] [address] [phone]

## 2023-09-15 ENCOUNTER — Ambulatory Visit
Admission: RE | Admit: 2023-09-15 | Discharge: 2023-09-15 | Disposition: A | Discharge status: Home / Self Care | Payer: BC Managed Care – PPO | Source: Ambulatory Visit | Attending: Physician Assistant

## 2023-09-15 DIAGNOSIS — R3129 Other microscopic hematuria: Secondary | ICD-10-CM | POA: Insufficient documentation

## 2023-09-15 DIAGNOSIS — K439 Ventral hernia without obstruction or gangrene: Secondary | ICD-10-CM | POA: Diagnosis not present

## 2023-09-15 MED ORDER — IOHEXOL 300 MG/ML  SOLN
150.0000 mL | Freq: Once | INTRAMUSCULAR | Status: AC | PRN
  Administered 2023-09-15: 150 mL via INTRAVENOUS

## 2023-09-15 MED ORDER — SODIUM CHLORIDE 0.9 % IV BOLUS
250.0000 mL | Freq: Once | INTRAVENOUS | Status: AC
  Administered 2023-09-15: 250 mL via INTRAVENOUS

## 2023-09-22 ENCOUNTER — Ambulatory Visit (INDEPENDENT_AMBULATORY_CARE_PROVIDER_SITE_OTHER): Payer: BC Managed Care – PPO

## 2023-09-22 DIAGNOSIS — J309 Allergic rhinitis, unspecified: Secondary | ICD-10-CM

## 2023-10-03 DIAGNOSIS — M542 Cervicalgia: Secondary | ICD-10-CM | POA: Diagnosis not present

## 2023-10-03 DIAGNOSIS — G43719 Chronic migraine without aura, intractable, without status migrainosus: Secondary | ICD-10-CM | POA: Diagnosis not present

## 2023-10-04 ENCOUNTER — Ambulatory Visit (INDEPENDENT_AMBULATORY_CARE_PROVIDER_SITE_OTHER): Payer: Self-pay

## 2023-10-04 DIAGNOSIS — J309 Allergic rhinitis, unspecified: Secondary | ICD-10-CM

## 2023-10-05 ENCOUNTER — Other Ambulatory Visit: Payer: Self-pay | Admitting: "Endocrinology

## 2023-10-13 ENCOUNTER — Ambulatory Visit (INDEPENDENT_AMBULATORY_CARE_PROVIDER_SITE_OTHER): Payer: Self-pay

## 2023-10-13 DIAGNOSIS — J309 Allergic rhinitis, unspecified: Secondary | ICD-10-CM

## 2023-10-18 ENCOUNTER — Ambulatory Visit (INDEPENDENT_AMBULATORY_CARE_PROVIDER_SITE_OTHER): Payer: Self-pay | Admitting: *Deleted

## 2023-10-18 DIAGNOSIS — J309 Allergic rhinitis, unspecified: Secondary | ICD-10-CM

## 2023-11-03 ENCOUNTER — Ambulatory Visit (INDEPENDENT_AMBULATORY_CARE_PROVIDER_SITE_OTHER): Payer: BC Managed Care – PPO | Admitting: *Deleted

## 2023-11-03 DIAGNOSIS — J309 Allergic rhinitis, unspecified: Secondary | ICD-10-CM

## 2023-11-14 DIAGNOSIS — G43719 Chronic migraine without aura, intractable, without status migrainosus: Secondary | ICD-10-CM | POA: Diagnosis not present

## 2023-11-14 DIAGNOSIS — J3081 Allergic rhinitis due to animal (cat) (dog) hair and dander: Secondary | ICD-10-CM | POA: Diagnosis not present

## 2023-11-14 DIAGNOSIS — M542 Cervicalgia: Secondary | ICD-10-CM | POA: Diagnosis not present

## 2023-11-14 NOTE — Progress Notes (Signed)
 VIAL ONE MADE 11-14-23. EXP 11-13-24

## 2023-11-15 ENCOUNTER — Ambulatory Visit (INDEPENDENT_AMBULATORY_CARE_PROVIDER_SITE_OTHER): Payer: Self-pay

## 2023-11-15 DIAGNOSIS — J309 Allergic rhinitis, unspecified: Secondary | ICD-10-CM

## 2023-11-17 NOTE — Progress Notes (Signed)
 VIAL MADE 11-15-23 EXP 11-14-24

## 2023-11-22 ENCOUNTER — Ambulatory Visit: Payer: BC Managed Care – PPO | Admitting: Allergy & Immunology

## 2023-11-22 ENCOUNTER — Other Ambulatory Visit: Payer: Self-pay

## 2023-11-22 ENCOUNTER — Encounter: Payer: Self-pay | Admitting: Allergy & Immunology

## 2023-11-22 VITALS — BP 92/60 | HR 75 | Temp 98.2°F | Resp 14 | Wt 144.4 lb

## 2023-11-22 DIAGNOSIS — R42 Dizziness and giddiness: Secondary | ICD-10-CM | POA: Diagnosis not present

## 2023-11-22 DIAGNOSIS — J302 Other seasonal allergic rhinitis: Secondary | ICD-10-CM | POA: Diagnosis not present

## 2023-11-22 DIAGNOSIS — J3089 Other allergic rhinitis: Secondary | ICD-10-CM | POA: Diagnosis not present

## 2023-11-22 MED ORDER — EPINEPHRINE 0.3 MG/0.3ML IJ SOAJ
0.3000 mg | INTRAMUSCULAR | 1 refills | Status: AC | PRN
Start: 2023-11-22 — End: ?

## 2023-11-22 MED ORDER — IPRATROPIUM BROMIDE 0.06 % NA SOLN: 1.0000 | Freq: Three times a day (TID) | NASAL | 5 refills | Status: DC | PRN

## 2023-11-22 NOTE — Patient Instructions (Addendum)
 1. Seasonal and perennial allergic rhinitis - with overlying migraines (grasses, ragweed, weeds, trees, indoor molds, outdoor molds, cat, and dog) - Continue taking: Allegra one tablet in the morning and Zyrtec and Xyzal at night - Start taking: ipratropium one spray per nostril to 3 times daily - This can be OVER drying.  - You can use an extra dose of the antihistamine, if needed, for breakthrough symptoms.  - We will touch base in 3-6 months to see how things are going.  - We will get some blood work to look for dust mite sensitization (this is the only thing that is NOT covered in your vials, although it has been negative in the past).  - Maybe you have developed a new allergen for whatever reason.   2. Vertigo - Continue to follow with the headache doctor.   3. Return in about 6 months (around 05/24/2024).    Please inform us of any Emergency Department visits, hospitalizations, or changes in symptoms. Call us before going to the ED for breathing or allergy symptoms since we might be able to fit you in for a sick visit. Feel free to contact us anytime with any questions, problems, or concerns.  It was a pleasure to see you again today!  Websites that have reliable patient information: 1. American Academy of Asthma, Allergy, and Immunology: www.aaaai.org 2. Food Allergy Research and Education (FARE): foodallergy.org 3. Mothers of Asthmatics: http://www.asthmacommunitynetwork.org 4. American College of Allergy, Asthma, and Immunology: www.acaai.org   COVID-19 Vaccine Information can be found at: [email or web address].nl For questions related to vaccine distribution or appointments, please email vaccine@ [email or web address] or call [phone].   We realize that you might be concerned about having an allergic reaction to the COVID19 vaccines. To help with that concern, WE ARE OFFERING THE COVID19 VACCINES IN OUR OFFICE! Ask the front desk  for dates!     "Like" [place] on Facebook and Instagram for our latest updates!      A healthy democracy works best when [employer] participate! Make sure you are registered to vote! If you have moved or changed any of your contact information, you will need to get this updated before voting!  In some cases, you MAY be able to register to vote online: [email or web address]

## 2023-11-22 NOTE — Progress Notes (Unsigned)
 FOLLOW UP  Date of Service/Encounter:  11/22/23   Assessment:   Seasonal and perennial allergic rhinitis (grasses, ragweed, weeds, trees, indoor molds, outdoor molds, cat, and dog) - on allergen immunotherapy with maintenance reached August 2024, prescription changed to be strong in fall 2024   Chronic migraines - receiving Emgality    Mouth breakouts over the mouth - with negative testing to popcorn   Plan/Recommendations:   1. Seasonal and perennial allergic rhinitis - with overlying migraines (grasses, ragweed, weeds, trees, indoor molds, outdoor molds, cat, and dog) - Continue taking: Allegra one tablet in the morning and Zyrtec and Xyzal at night - Start taking: ipratropium one spray per nostril to 3 times daily - This can be OVER drying.  - You can use an extra dose of the antihistamine, if needed, for breakthrough symptoms.  - We will touch base in 3-6 months to see how things are going.  - We will get some blood work to look for dust mite sensitization (this is the only thing that is NOT covered in your vials, although it has been negative in the past).  - Maybe you have developed a new allergen for whatever reason.   2. Vertigo - Continue to follow with the headache doctor.   3. Return in about 6 months (around 05/24/2024).   Subjective:   Maragret D Limberg is a 43 y.o. female presenting today for follow up of  Chief Complaint  Patient presents with   Follow-up    Stopped using xhance- caused vertigo  Allergies have not been doing well depsite taking xyzal,zyrtec and allegra.     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   Decreased libido 05/11/2022   Neutrophilia 03/25/2022   Rash of mouth present on examination 03/25/2022   Supraumbilical hernia 02/16/2022   Peroneal tendinitis, left 01/27/2022   History of ovarian cyst 01/24/2022   Chronic pain of left ankle 01/24/2022   Need for hepatitis B screening test 01/24/2022   Vertigo 07/19/2021   Seasonal and perennial allergic rhinitis 06/04/2021   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   Trapezius strain 11/06/2020   Sleeping difficulty 11/06/2020   Family history of skin cancer    Family history of cervical cancer    Family history of throat cancer    ANA positive 11/06/2019   Arthralgia 09/30/2019   Allergic rhinitis 05/06/2019   Calculus of gallbladder without cholecystitis without obstruction    Prolapse of female pelvic organs 08/02/2018   Rectocele 08/02/2018   Dyspepsia    Muscle ache 04/23/2018   Paresthesia 04/23/2018   Anxiety and depression 04/23/2018   Tension headache 04/23/2018   Hyperlipidemia 05/02/2017   Family history of breast cancer 12/15/2016   History of thyroid cancer 06/27/2014   Hypothyroidism 06/27/2014   Migraine 09/23/2013    History obtained from: chart review and patient.  Discussed the use of AI scribe software for clinical note transcription with the patient and/or guardian, who gave verbal consent to proceed.  Noreene is a 43 y.o. female presenting for a follow up visit.  She  was last seen in August 2024.  At that time we continued with Allegra in the morning and Xyzal at night.  We also continue with Zyrtec and Xyzal at night as well.  She remains on Xhance 1 spray per nostril up to twice daily.  We did remixed her allergen past increase to pollens.  For her vertigo, she continue to follow with her headache doctor.  Since last visit, she has done well.  Allergic Rhinitis Symptom History: She experiences  persistent allergy symptoms despite receiving allergy shots. Her headaches have improved slightly, but she continues to have a runny nose, especially by the end of the day, and her head hurts. Dust is a significant trigger, causing her eyes to water and sneezing when cleaning or dusting. A new vacuum has helped somewhat. She takes multiple antihistamines, including Allegra in the morning, and Zyrtec and Xyzal at night. She feels sleepy with Xyzal and has been buying over-the-counter medications due to running out of prescriptions. Benadryl is used occasionally to manage symptoms when outdoors.  She has tried various nasal sprays, including Flonase and Xhance, but discontinued Xhance due to vertigo and dizziness. She wakes up with a stuffy nose every morning, a symptom shared by her husband. Her symptoms with the dog have improved with allergy shots, as her eyes do not water as much and sneezing has decreased.  Therefore, the shots have not been in total vein.  Glorya is on allergen immunotherapy. She receives two injections. Immunotherapy script #1 contains  ragweed and molds. She currently receives 0.45mL of the RED vial (1/100). Immunotherapy script #2 contains trees, weeds, grasses, cat, and dog. She currently receives 0.102mL of the RED vial (1/100). She started shots March of 2024 and reached maintenance in August of 2024.  She experiences headaches and muscle cramps, which she associates with caffeine intake, and reports that caffeine causes her heart to race. She is currently using Emgality injections for her migraines, which she administers in her stomach. Sometimes her stomach swells and she feels gassy after the injections.  Otherwise, there have been no changes to her past medical history, surgical history, family history, or social history.    Review of systems otherwise negative other than that mentioned in the HPI.    Objective:   Blood pressure 92/60, pulse 75, temperature 98.2 F  (36.8 C), resp. rate 14, weight 144 lb 6 oz (65.5 kg), SpO2 96%. Body mass index is 28.2 kg/m.    Physical Exam Vitals reviewed.  Constitutional:      Appearance: She is well-developed.     Comments: Pleasant. Talkative.   HENT:     Head: Normocephalic and atraumatic.     Right Ear: Tympanic membrane, ear canal and external ear normal.     Left Ear: Tympanic membrane, ear canal and external ear normal.     Nose: No nasal deformity, septal deviation, mucosal edema or rhinorrhea.     Right Turbinates: Enlarged, swollen and pale.     Left Turbinates: Enlarged, swollen and pale.     Right Sinus: No maxillary sinus tenderness or frontal sinus tenderness.     Left Sinus: No maxillary sinus tenderness or frontal sinus tenderness.     Comments: No polyps.    Mouth/Throat:     Mouth: Mucous membranes are not pale and not dry.     Pharynx: Uvula midline.     Comments: Moderate cobblestoning. Eyes:     General: Lids are normal. Allergic shiner present.  Right eye: No discharge.        Left eye: No discharge.     Conjunctiva/sclera: Conjunctivae normal.     Right eye: Right conjunctiva is not injected. No chemosis.    Left eye: Left conjunctiva is not injected. No chemosis.    Pupils: Pupils are equal, round, and reactive to light.  Cardiovascular:     Rate and Rhythm: Normal rate and regular rhythm.     Heart sounds: Normal heart sounds.  Pulmonary:     Effort: Pulmonary effort is normal. No tachypnea, accessory muscle usage or respiratory distress.     Breath sounds: Normal breath sounds. No wheezing, rhonchi or rales.     Comments: Moving air well in all lung fields.  No increased work of breathing. Chest:     Chest wall: No tenderness.  Lymphadenopathy:     Cervical: No cervical adenopathy.  Skin:    General: Skin is warm.     Capillary Refill: Capillary refill takes less than 2 seconds.     Coloration: Skin is not pale.     Findings: No abrasion, erythema, petechiae or  rash. Rash is not papular, urticarial or vesicular.     Comments: No eczematous or urticarial lesions noted.  Neurological:     Mental Status: She is alert.  Psychiatric:        Behavior: Behavior is cooperative.      Diagnostic studies: none      Malachi Bonds, MD  [facility]

## 2023-11-27 LAB — D001-IGE D PTERONYSSINUS: D Pteronyssinus IgE: <0.1 kU/L

## 2023-11-27 LAB — D002-IGE D FARINAE: D Farinae IgE: <0.1 kU/L

## 2023-11-30 ENCOUNTER — Encounter: Payer: Self-pay | Admitting: Allergy & Immunology

## 2023-12-13 ENCOUNTER — Ambulatory Visit (INDEPENDENT_AMBULATORY_CARE_PROVIDER_SITE_OTHER): Payer: Self-pay

## 2023-12-13 DIAGNOSIS — J309 Allergic rhinitis, unspecified: Secondary | ICD-10-CM

## 2023-12-19 DIAGNOSIS — G43719 Chronic migraine without aura, intractable, without status migrainosus: Secondary | ICD-10-CM | POA: Diagnosis not present

## 2023-12-19 DIAGNOSIS — M542 Cervicalgia: Secondary | ICD-10-CM | POA: Diagnosis not present

## 2023-12-20 ENCOUNTER — Ambulatory Visit (INDEPENDENT_AMBULATORY_CARE_PROVIDER_SITE_OTHER): Payer: Self-pay

## 2023-12-20 DIAGNOSIS — J309 Allergic rhinitis, unspecified: Secondary | ICD-10-CM

## 2024-01-03 ENCOUNTER — Ambulatory Visit (INDEPENDENT_AMBULATORY_CARE_PROVIDER_SITE_OTHER): Payer: Self-pay

## 2024-01-03 DIAGNOSIS — J309 Allergic rhinitis, unspecified: Secondary | ICD-10-CM

## 2024-01-07 ENCOUNTER — Other Ambulatory Visit: Payer: Self-pay | Admitting: "Endocrinology

## 2024-01-17 ENCOUNTER — Ambulatory Visit (INDEPENDENT_AMBULATORY_CARE_PROVIDER_SITE_OTHER)

## 2024-01-17 DIAGNOSIS — J309 Allergic rhinitis, unspecified: Secondary | ICD-10-CM | POA: Diagnosis not present

## 2024-01-24 ENCOUNTER — Ambulatory Visit (INDEPENDENT_AMBULATORY_CARE_PROVIDER_SITE_OTHER): Payer: Self-pay

## 2024-01-24 DIAGNOSIS — J309 Allergic rhinitis, unspecified: Secondary | ICD-10-CM

## 2024-01-29 DIAGNOSIS — M542 Cervicalgia: Secondary | ICD-10-CM | POA: Diagnosis not present

## 2024-01-29 DIAGNOSIS — G43719 Chronic migraine without aura, intractable, without status migrainosus: Secondary | ICD-10-CM | POA: Diagnosis not present

## 2024-02-01 DIAGNOSIS — S86911A Strain of unspecified muscle(s) and tendon(s) at lower leg level, right leg, initial encounter: Secondary | ICD-10-CM | POA: Diagnosis not present

## 2024-02-01 DIAGNOSIS — Z6828 Body mass index (BMI) 28.0-28.9, adult: Secondary | ICD-10-CM | POA: Diagnosis not present

## 2024-02-01 DIAGNOSIS — E663 Overweight: Secondary | ICD-10-CM | POA: Diagnosis not present

## 2024-02-20 ENCOUNTER — Ambulatory Visit: Payer: BC Managed Care – PPO | Admitting: Family Medicine

## 2024-02-20 ENCOUNTER — Encounter: Payer: Self-pay | Admitting: Family Medicine

## 2024-02-20 VITALS — BP 100/68 | HR 67 | Temp 98.4°F | Resp 20 | Ht 60.0 in | Wt 141.4 lb

## 2024-02-20 DIAGNOSIS — M25561 Pain in right knee: Secondary | ICD-10-CM

## 2024-02-20 DIAGNOSIS — Z87898 Personal history of other specified conditions: Secondary | ICD-10-CM

## 2024-02-20 DIAGNOSIS — J309 Allergic rhinitis, unspecified: Secondary | ICD-10-CM | POA: Diagnosis not present

## 2024-02-20 DIAGNOSIS — G8929 Other chronic pain: Secondary | ICD-10-CM

## 2024-02-20 MED ORDER — DICLOFENAC SODIUM 1 % EX GEL
4.0000 g | Freq: Four times a day (QID) | CUTANEOUS | 1 refills | Status: DC
Start: 2024-02-20 — End: 2024-05-24

## 2024-02-20 MED ORDER — AZELASTINE HCL 0.1 % NA SOLN
2.0000 | Freq: Two times a day (BID) | NASAL | 1 refills | Status: DC
Start: 2024-02-20 — End: 2024-03-13

## 2024-02-20 MED ORDER — FLUTICASONE PROPIONATE 50 MCG/ACT NA SUSP: 2.0000 | Freq: Two times a day (BID) | NASAL | 1 refills | Status: DC

## 2024-02-20 NOTE — Patient Instructions (Addendum)
 It was a pleasure meeting you today. Thank you for allowing me to take part in your health care.  Our goals for today as we discussed include:  Flonase  2 sprays 2 times a day Astelin  2 sprays 2 times a day  Diclofenac gel 4 times a day to right knee if needed   Recommend Sports Medicine at [facility] Donella Fulling, DO [address] [place], [place], [place] [address] Phone: [phone] or go to website to schedule appointment if pain continues    This is a list of the screening recommended for you and due dates:  Health Maintenance  Topic Date Due   Pneumococcal Vaccination (1 of 2 - PCV) Never done   COVID-19 Vaccine (5 - 2024-25 season) 05/21/2023   Flu Shot  04/19/2024   DTaP/Tdap/Td vaccine (3 - Td or Tdap) 04/01/2026   Hepatitis C Screening  Completed   HIV Screening  Completed   HPV Vaccine  Aged Out   Meningitis B Vaccine  Aged Out     If you have any questions or concerns, please do not hesitate to call the office at [phone].  I look forward to our next visit and until then take care and stay safe.  Regards,   Valli Gaw, MD   [facility]

## 2024-02-22 ENCOUNTER — Encounter: Payer: Self-pay | Admitting: Family Medicine

## 2024-02-22 DIAGNOSIS — M25561 Pain in right knee: Secondary | ICD-10-CM | POA: Insufficient documentation

## 2024-02-22 DIAGNOSIS — Z87898 Personal history of other specified conditions: Secondary | ICD-10-CM | POA: Insufficient documentation

## 2024-02-22 NOTE — Progress Notes (Signed)
 SUBJECTIVE:   Chief Complaint  Patient presents with   Knee Pain    When dancing it swells up and hurt mostly right   HPI Presents for  Discussed the use of AI scribe software for clinical note transcription with the patient, who gave verbal consent to proceed.  History of Present Illness Sarah Moran is a 43 year old female who presents with knee pain and swelling.  She experiences knee pain and swelling that began after working on her knees with knee pads while gardening. Initially, she was fine, but later in the day, the pain worsened, particularly when driving, and the knee swelled. The pain is described as a pulling sensation, and it sometimes locks up, especially when dancing or using the treadmill. The pain is not constant but occurs with certain activities. No current sharp knee pain, but occasional swelling and locking are noted.  She visited urgent care at [facility], where she was told it might be a muscle strain. No diagnostic tests were performed at that time. She has not sustained any specific injury to the knee. The pain and swelling subside with rest, ice, and ibuprofen . She also uses horse liniment, which she finds effective.  Her past medical history includes a borderline autoimmune disorder diagnosis, but no specific disorder was identified. She is currently on Emgality, administered once a month, prescribed by a headache wellness doctor in [place]. She also has a history of ankle issues from a previous injury two and a half years ago, which occasionally flares up.  She experiences throat issues with hoarseness and voice loss, particularly after singing or upon waking. She has a history of allergies and uses Allegra in the morning and Xyzal  at night. She has tried Flonase  in the past but stopped due to perceived ineffectiveness. She drinks 8-12 bottles of water daily to manage symptoms.  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 She uses thick knee pads during these activities to protect her knees.     PERTINENT PMH / PSH: As above  OBJECTIVE:  BP 100/68   Pulse 67   Temp 98.4 F (36.9 C)   Resp 20   Ht 5' (1.524 m)   Wt 141 lb 6 oz (64.1 kg)   SpO2 99%   BMI 27.61 kg/m    Physical Exam Vitals reviewed.  Constitutional:      General: She is not in acute distress.    Appearance: Normal appearance. She is normal weight. She is not ill-appearing, toxic-appearing or diaphoretic.  Eyes:     General:        Right eye: No discharge.        Left eye: No discharge.     Conjunctiva/sclera: Conjunctivae normal.  Cardiovascular:     Rate and Rhythm: Normal rate and regular rhythm.     Heart sounds: Normal heart sounds.  Pulmonary:     Effort: Pulmonary effort is normal.     Breath sounds: Normal breath sounds.  Abdominal:     General: Bowel sounds are normal.  Musculoskeletal:        General: No swelling, tenderness or deformity. Normal range of motion.  Skin:    General: Skin is warm and dry.  Neurological:     General: No focal deficit present.     Mental Status: She is alert and oriented to person, place, and time. Mental status is at baseline.  Psychiatric:  Mood and Affect: Mood normal.        Behavior: Behavior normal.        Thought Content: Thought content normal.        Judgment: Judgment normal.           02/20/2024    9:23 AM 08/22/2023    8:23 AM 02/17/2023    8:10 AM 08/18/2022    8:52 AM 05/11/2022    9:04 AM  Depression screen PHQ 2/9  Decreased Interest 1 0 0 0 0  Down, Depressed, Hopeless 0 0 0 0 0  PHQ - 2 Score 1 0 0 0 0  Altered sleeping 1 1 2     Tired, decreased energy 1 1 3     Change in appetite 0 0 3    Feeling bad or failure about yourself  0 0 0    Trouble concentrating 0 0 0    Moving slowly or fidgety/restless 0 0 0    Suicidal thoughts 0 0 0    PHQ-9 Score 3 2 8     Difficult doing work/chores Not difficult  at all Not difficult at all Not difficult at all        02/20/2024    9:23 AM 08/22/2023    8:24 AM 02/17/2023    8:11 AM 09/24/2020    9:04 AM  GAD 7 : Generalized Anxiety Score  Nervous, Anxious, on Edge 1 1 0 1  Control/stop worrying 0 0 0 0  Worry too much - different things 0 1 0 2  Trouble relaxing 0 1 0 0  Restless 0 0 0 1  Easily annoyed or irritable 1 0 0 1  Afraid - awful might happen 0 0 0 1  Total GAD 7 Score 2 3 0 6  Anxiety Difficulty Not difficult at all Not difficult at all Not difficult at all     ASSESSMENT/PLAN:  Chronic pain of right knee Assessment & Plan: Intermittent knee pain and swelling likely due to  inflammation. Considered tendonitis. Discussed diclofenac gel to avoid ibuprofen 's gastrointestinal side effects. Emphasized rest and proper body mechanics. - Advise rest and avoidance of activities that exacerbate pain. - Recommend ibuprofen  as needed for pain management. - Prescribe diclofenac gel for topical use to reduce inflammation. - Advise use of knee pads during gardening to reduce strain. - Discuss body mechanics to prevent strain during activities. - Provide information on sports medicine specialist for future reference if symptoms persist.  Orders: -     Diclofenac Sodium; Apply 4 g topically 4 (four) times daily.  Dispense: 350 g; Refill: 1  Allergic rhinitis, unspecified seasonality, unspecified trigger Assessment & Plan: Refill Flonase  Refill Asteline  Orders: -     Fluticasone  Propionate; Place 2 sprays into both nostrils 2 (two) times daily.  Dispense: 16 g; Refill: 1 -     Azelastine  HCl; Place 2 sprays into both nostrils 2 (two) times daily. Use in each nostril as directed  Dispense: 30 mL; Refill: 1  History of hoarseness Assessment & Plan: Intermittent hoarseness possibly related to allergies or vocal strain. Previous nasal sprays caused migraines. ENT evaluation suggested. Discussed trial of Flonase  and Astelin . - Recommend trial of  Flonase  and Astelin  nasal sprays to manage allergy symptoms. - Advise increased water intake to help with throat symptoms. - Follow-up with ENT for further evaluation of vocal strain      PDMP reviewed  No follow-ups on file.  Valli Gaw, MD

## 2024-02-22 NOTE — Assessment & Plan Note (Signed)
 Intermittent hoarseness possibly related to allergies or vocal strain. Previous nasal sprays caused migraines. ENT evaluation suggested. Discussed trial of Flonase  and Astelin . - Recommend trial of Flonase  and Astelin  nasal sprays to manage allergy symptoms. - Advise increased water intake to help with throat symptoms. - Follow-up with ENT for further evaluation of vocal strain

## 2024-02-22 NOTE — Assessment & Plan Note (Signed)
 Intermittent knee pain and swelling likely due to  inflammation. Considered tendonitis. Discussed diclofenac gel to avoid ibuprofen 's gastrointestinal side effects. Emphasized rest and proper body mechanics. - Advise rest and avoidance of activities that exacerbate pain. - Recommend ibuprofen  as needed for pain management. - Prescribe diclofenac gel for topical use to reduce inflammation. - Advise use of knee pads during gardening to reduce strain. - Discuss body mechanics to prevent strain during activities. - Provide information on sports medicine specialist for future reference if symptoms persist.

## 2024-02-22 NOTE — Assessment & Plan Note (Signed)
 Refill Flonase  Refill Asteline

## 2024-02-23 ENCOUNTER — Ambulatory Visit (INDEPENDENT_AMBULATORY_CARE_PROVIDER_SITE_OTHER): Payer: Self-pay

## 2024-02-23 DIAGNOSIS — J309 Allergic rhinitis, unspecified: Secondary | ICD-10-CM | POA: Diagnosis not present

## 2024-02-26 DIAGNOSIS — M542 Cervicalgia: Secondary | ICD-10-CM | POA: Diagnosis not present

## 2024-02-26 DIAGNOSIS — G43719 Chronic migraine without aura, intractable, without status migrainosus: Secondary | ICD-10-CM | POA: Diagnosis not present

## 2024-02-27 DIAGNOSIS — Z85850 Personal history of malignant neoplasm of thyroid: Secondary | ICD-10-CM | POA: Diagnosis not present

## 2024-02-27 DIAGNOSIS — E890 Postprocedural hypothyroidism: Secondary | ICD-10-CM | POA: Diagnosis not present

## 2024-03-07 LAB — TSH: TSH: 0.754 u[IU]/mL (ref 0.450–4.500)

## 2024-03-07 LAB — THYROGLOBULIN LEVEL: Thyroglobulin (TG-RIA): <2 ng/mL

## 2024-03-07 LAB — THYROGLOBULIN ANTIBODY: Thyroglobulin Antibody: <1 [IU]/mL (ref 0.0–0.9)

## 2024-03-07 LAB — T4, FREE: Free T4: 1.62 ng/dL (ref 0.82–1.77)

## 2024-03-13 ENCOUNTER — Other Ambulatory Visit: Payer: Self-pay | Admitting: Family Medicine

## 2024-03-13 DIAGNOSIS — J309 Allergic rhinitis, unspecified: Secondary | ICD-10-CM

## 2024-04-02 ENCOUNTER — Other Ambulatory Visit: Payer: Self-pay | Admitting: "Endocrinology

## 2024-04-04 DIAGNOSIS — J3081 Allergic rhinitis due to animal (cat) (dog) hair and dander: Secondary | ICD-10-CM | POA: Diagnosis not present

## 2024-04-04 NOTE — Progress Notes (Signed)
 VIALS MADE 04-04-24

## 2024-04-05 ENCOUNTER — Ambulatory Visit (INDEPENDENT_AMBULATORY_CARE_PROVIDER_SITE_OTHER): Payer: Self-pay

## 2024-04-05 DIAGNOSIS — J309 Allergic rhinitis, unspecified: Secondary | ICD-10-CM

## 2024-04-08 DIAGNOSIS — J302 Other seasonal allergic rhinitis: Secondary | ICD-10-CM | POA: Diagnosis not present

## 2024-04-09 DIAGNOSIS — M542 Cervicalgia: Secondary | ICD-10-CM | POA: Diagnosis not present

## 2024-04-09 DIAGNOSIS — G43719 Chronic migraine without aura, intractable, without status migrainosus: Secondary | ICD-10-CM | POA: Diagnosis not present

## 2024-04-30 NOTE — Telephone Encounter (Signed)
 It's okay to wait till establishing care with me for screening colonoscopy.   Luke Shade, MD

## 2024-05-01 ENCOUNTER — Ambulatory Visit (INDEPENDENT_AMBULATORY_CARE_PROVIDER_SITE_OTHER): Payer: Self-pay

## 2024-05-01 DIAGNOSIS — J309 Allergic rhinitis, unspecified: Secondary | ICD-10-CM | POA: Diagnosis not present

## 2024-05-14 DIAGNOSIS — G43719 Chronic migraine without aura, intractable, without status migrainosus: Secondary | ICD-10-CM | POA: Diagnosis not present

## 2024-05-14 DIAGNOSIS — M542 Cervicalgia: Secondary | ICD-10-CM | POA: Diagnosis not present

## 2024-05-24 ENCOUNTER — Encounter: Payer: Self-pay | Admitting: Allergy & Immunology

## 2024-05-24 ENCOUNTER — Other Ambulatory Visit: Payer: Self-pay

## 2024-05-24 ENCOUNTER — Ambulatory Visit (INDEPENDENT_AMBULATORY_CARE_PROVIDER_SITE_OTHER): Admitting: Allergy & Immunology

## 2024-05-24 VITALS — BP 108/60 | HR 82 | Temp 97.8°F | Resp 18 | Ht 61.02 in | Wt 141.6 lb

## 2024-05-24 DIAGNOSIS — R42 Dizziness and giddiness: Secondary | ICD-10-CM | POA: Diagnosis not present

## 2024-05-24 DIAGNOSIS — J3089 Other allergic rhinitis: Secondary | ICD-10-CM | POA: Diagnosis not present

## 2024-05-24 DIAGNOSIS — G43809 Other migraine, not intractable, without status migrainosus: Secondary | ICD-10-CM

## 2024-05-24 DIAGNOSIS — J302 Other seasonal allergic rhinitis: Secondary | ICD-10-CM | POA: Diagnosis not present

## 2024-05-24 MED ORDER — PANTOPRAZOLE SODIUM 40 MG PO TBEC: 40.0000 mg | DELAYED_RELEASE_TABLET | Freq: Every day | ORAL | 0 refills | Status: DC

## 2024-05-24 NOTE — Progress Notes (Signed)
 FOLLOW UP  Date of Service/Encounter:  05/24/24   Assessment:   Seasonal and perennial allergic rhinitis (grasses, ragweed, weeds, trees, indoor molds, outdoor molds, cat, and dog) - on allergen immunotherapy with maintenance reached August 2024, prescription changed to be strong in fall 2024  Adding dust mite vial - with plans to add dust mite to her mold vial when she gets to the top   Chronic migraines - receiving Ajovy now    Mouth breakouts over the mouth - with negative testing to popcorn  Plan/Recommendations:   1. Seasonal and perennial allergic rhinitis - with overlying migraines (grasses, ragweed, weeds, trees, indoor molds, outdoor molds, cat, and dog) - Continue taking: Allegra and Xyzal  in the morning and Zyrtec at night - You can use an extra dose of the antihistamine, if needed, for breakthrough symptoms.  - We are going to add on a dust mite vial given the worsening symptoms in the morning hours (even though your testing was negative on the skin and blood).  - Come weekly for this injection and then once you are at the top dose, we can mix the dust mite into the mold and ragweed vial. - Make an appointment to start that vial in two weeks.  - We will also get a sinus CT to make sure that there is nothing surgical that we are missing.    2. Vertigo - Continue to follow with the headache doctor.   3. Hoarseness - Start Protonix  40mg  once daily to see if this helps with your hoarseness and your congestion. - Sometimes reflux can make things a lot worse with regards to congestion.   4. Return in about 6 months (around 11/21/2024). You can have the follow up appointment with Dr. Iva or a Nurse Practicioner (our Nurse Practitioners are excellent and always have Physician oversight!).    Subjective:   Sarah Moran is a 43 y.o. female presenting today for follow up of  Chief Complaint  Patient presents with   Follow-up    Sarah Moran has a history of  the following: Patient Active Problem List   Diagnosis Date Noted   Right knee pain 02/22/2024   History of hoarseness 02/22/2024   Encounter for general adult medical examination with abnormal findings 08/22/2023   Right hip pain 08/22/2023   Swelling of joint of right knee 08/22/2023   Acute non-recurrent pansinusitis 08/22/2023   Overweight 02/17/2023   Breast tenderness in female 02/17/2023   Gross hematuria 08/18/2022   Prediabetes 08/18/2022   Depression, major, single episode, 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   Decreased libido 05/11/2022   Neutrophilia 03/25/2022   Rash of mouth present on examination 03/25/2022   Supraumbilical hernia 02/16/2022   Peroneal tendinitis, left 01/27/2022   History of ovarian cyst 01/24/2022   Chronic pain of left ankle 01/24/2022   Need for hepatitis B screening test 01/24/2022   Vertigo 07/19/2021   Seasonal and perennial allergic rhinitis 06/04/2021   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   Trapezius strain 11/06/2020   Sleeping difficulty 11/06/2020   Family history of skin cancer    Family history of cervical cancer    Family history of throat cancer    ANA positive 11/06/2019   Arthralgia 09/30/2019   Allergic rhinitis 05/06/2019   Calculus of gallbladder without cholecystitis without obstruction    Prolapse of female pelvic organs 08/02/2018   Rectocele 08/02/2018   Dyspepsia    Muscle ache 04/23/2018   Paresthesia 04/23/2018   Anxiety and  depression 04/23/2018   Tension headache 04/23/2018   Hyperlipidemia 05/02/2017   Family history of breast cancer 12/15/2016   History of thyroid  cancer 06/27/2014   Hypothyroidism 06/27/2014   Migraine 09/23/2013    History obtained from: chart review and patient.  Discussed the use of AI scribe software for clinical note transcription with the patient and/or guardian, who gave verbal consent to proceed.  Sarah Moran is a 43 y.o. female presenting for a follow up visit.  She was last seen in March  2025.  At that time, she was continued on Allegra 1 tablet in the morning and Zyrtec in the last night.  We started her on ipratropium 1 spray per nostril up to 3 times a day.  We obtained blood work to look for dust mite sensitization.  She was still having some symptoms despite allergy shots, which is why we will look for that.  Her vertigo was under fair control, which is managed by her headache physician.  Since the last visit, she has been around the same.   Allergic Rhinitis Symptom History: She experiences persistent allergy symptoms, particularly when outdoors, leading to voice loss. Symptoms worsen if she misses a dose of her allergy medication. She takes Xyzal  at 10 AM to avoid headaches and uses a nasal rinse at night. She has reduced dairy intake to decrease mucus production. She experiences nasal congestion every morning. She uses dust mite covers on her bedding and changes pillows regularly. Symptoms worsen near carpets or when cleaning under the bed. She has a dog that she no longer allows on her bed to reduce congestion.  [name] is on allergen immunotherapy. She receives two injections. Immunotherapy script #1 contains  ragweed and molds. She currently receives 0.15mL of the RED vial (1/100). Immunotherapy script #2 contains trees, weeds, grasses, cat, and dog. She currently receives 0.15mL of the RED vial (1/100). She started shots March of 2024 and reached maintenance in August of 2024. She has been receiving allergy shots but notes no significant improvement. The injections cause more burning and pain now compared to when she first started.  She has a history of headaches and takes Ajovy monthly for migraine prevention. She previously used [employer] Headaches occur, especially after golfing, and she has consulted a headache specialist.  GERD Symptom History: She has a history of reflux and previously took Nexium, which caused migraines. She has used ginger root for reflux. She  experiences heartburn with certain foods and occasionally uses Tums for relief. She avoids spicy foods due to reflux symptoms.  She has a history of thyroid  issues since 2007, which she associates with her current health challenges. She is also prediabetic and has high cholesterol, which she finds difficult to manage despite efforts to lose weight.   Otherwise, there have been no changes to her past medical history, surgical history, family history, or social history.    Review of systems otherwise negative other than that mentioned in the HPI.    Objective:   Blood pressure 108/60, pulse 82, temperature 97.8 F (36.6 C), temperature source Temporal, resp. rate 18, height 5' 1.02 (1.55 m), weight 141 lb 9.6 oz (64.2 kg), SpO2 98%. Body mass index is 26.73 kg/m.    Physical Exam Vitals reviewed.  Constitutional:      Appearance: She is well-developed.     Comments: Pleasant. Talkative.   HENT:     Head: Normocephalic and atraumatic.     Right Ear: Tympanic membrane, ear canal and external ear normal.  Left Ear: Tympanic membrane, ear canal and external ear normal.     Nose: Rhinorrhea present. No nasal deformity, septal deviation or mucosal edema.     Right Turbinates: Enlarged, swollen and pale.     Left Turbinates: Enlarged, swollen and pale.     Right Sinus: No maxillary sinus tenderness or frontal sinus tenderness.     Left Sinus: No maxillary sinus tenderness or frontal sinus tenderness.     Comments: No polyps.    Mouth/Throat:     Mouth: Mucous membranes are not pale and not dry.     Pharynx: Uvula midline.     Comments: Moderate cobblestoning. Eyes:     General: Lids are normal. Allergic shiner present.        Right eye: No discharge.        Left eye: No discharge.     Conjunctiva/sclera: Conjunctivae normal.     Right eye: Right conjunctiva is not injected. No chemosis.    Left eye: Left conjunctiva is not injected. No chemosis.    Pupils: Pupils are equal,  round, and reactive to light.  Cardiovascular:     Rate and Rhythm: Normal rate and regular rhythm.     Heart sounds: Normal heart sounds.  Pulmonary:     Effort: Pulmonary effort is normal. No tachypnea, accessory muscle usage or respiratory distress.     Breath sounds: Normal breath sounds. No wheezing, rhonchi or rales.     Comments: Moving air well in all lung fields.  No increased work of breathing. Chest:     Chest wall: No tenderness.  Lymphadenopathy:     Cervical: No cervical adenopathy.  Skin:    General: Skin is warm.     Capillary Refill: Capillary refill takes less than 2 seconds.     Coloration: Skin is not pale.     Findings: No abrasion, erythema, petechiae or rash. Rash is not papular, urticarial or vesicular.     Comments: No eczematous or urticarial lesions noted.  Neurological:     Mental Status: She is alert.  Psychiatric:        Behavior: Behavior is cooperative.      Diagnostic studies: none        Marty Shaggy, MD  [facility] 

## 2024-05-24 NOTE — Patient Instructions (Addendum)
 1. Seasonal and perennial allergic rhinitis - with overlying migraines (grasses, ragweed, weeds, trees, indoor molds, outdoor molds, cat, and dog) - Continue taking: Allegra and Xyzal  in the morning and Zyrtec at night - You can use an extra dose of the antihistamine, if needed, for breakthrough symptoms.  - We are going to add on a dust mite vial given the worsening symptoms in the morning hours (even though your testing was negative on the skin and blood).  - Come weekly for this injection and then once you are at the top dose, we can mix the dust mite into the mold and ragweed vial. - Make an appointment to start that vial in two weeks.  - We will also get a sinus CT to make sure that there is nothing surgical that we are missing.    2. Vertigo - Continue to follow with the headache doctor.   3. Hoarseness - Start Protonix  40mg  once daily to see if this helps with your hoarseness and your congestion. - Sometimes reflux can make things a lot worse with regards to congestion.   4. Return in about 6 months (around 11/21/2024). You can have the follow up appointment with Dr. Iva or a Nurse Practicioner (our Nurse Practitioners are excellent and always have Physician oversight!).    Please inform us  of any Emergency Department visits, hospitalizations, or changes in symptoms. Call us  before going to the ED for breathing or allergy symptoms since we might be able to fit you in for a sick visit. Feel free to contact us  anytime with any questions, problems, or concerns.  It was a pleasure to see you again today!  Websites that have reliable patient information: 1. American Academy of Asthma, Allergy, and Immunology: www.aaaai.org 2. Food Allergy Research and Education (FARE): foodallergy.org 3. Mothers of Asthmatics: http://www.asthmacommunitynetwork.org 4. American College of Allergy, Asthma, and Immunology: www.acaai.org      "Like" us  on Facebook and Instagram for our latest  updates!      A healthy democracy works best when [employer] participate! Make sure you are registered to vote! If you have moved or changed any of your contact information, you will need to get this updated before voting! Scan the QR codes below to learn more!

## 2024-05-30 ENCOUNTER — Telehealth: Payer: Self-pay

## 2024-05-30 DIAGNOSIS — D2262 Melanocytic nevi of left upper limb, including shoulder: Secondary | ICD-10-CM | POA: Diagnosis not present

## 2024-05-30 DIAGNOSIS — D2272 Melanocytic nevi of left lower limb, including hip: Secondary | ICD-10-CM | POA: Diagnosis not present

## 2024-05-30 DIAGNOSIS — D225 Melanocytic nevi of trunk: Secondary | ICD-10-CM | POA: Diagnosis not present

## 2024-05-30 DIAGNOSIS — D2261 Melanocytic nevi of right upper limb, including shoulder: Secondary | ICD-10-CM | POA: Diagnosis not present

## 2024-05-30 NOTE — Telephone Encounter (Signed)
-----   Message from Marty Morton Shaggy sent at 05/24/2024  1:52 PM EDT ----- Sinus CT ordered. Unsure if PA is needed.

## 2024-05-30 NOTE — Telephone Encounter (Signed)
 Insurance was called and PA was obtained. Order #[id number], valid from 05/30/24-06/28/24.  Centralized scheduling was called to make the patient an appointment. Was scheduled for 06/02/24 at 6p.  Called patient and let her know that PA was approved and above notation. She states that date does not work for her but she would like centralized scheduling at [phone] for a date that works best for her. Verbalized understanding.

## 2024-05-31 NOTE — Telephone Encounter (Signed)
 Received fax from [facility] regarding CT scan. Letter has been placed to go to bulk scanning.

## 2024-06-02 ENCOUNTER — Ambulatory Visit (HOSPITAL_COMMUNITY)

## 2024-06-04 ENCOUNTER — Ambulatory Visit (HOSPITAL_COMMUNITY)
Admission: RE | Admit: 2024-06-04 | Discharge: 2024-06-04 | Disposition: A | Discharge status: Home / Self Care | Source: Ambulatory Visit | Attending: Allergy & Immunology | Admitting: Allergy & Immunology

## 2024-06-04 ENCOUNTER — Encounter: Payer: Self-pay | Admitting: "Endocrinology

## 2024-06-04 DIAGNOSIS — R42 Dizziness and giddiness: Secondary | ICD-10-CM | POA: Insufficient documentation

## 2024-06-04 DIAGNOSIS — G43809 Other migraine, not intractable, without status migrainosus: Secondary | ICD-10-CM | POA: Insufficient documentation

## 2024-06-04 DIAGNOSIS — J329 Chronic sinusitis, unspecified: Secondary | ICD-10-CM | POA: Diagnosis not present

## 2024-06-04 DIAGNOSIS — J3089 Other allergic rhinitis: Secondary | ICD-10-CM | POA: Diagnosis not present

## 2024-06-04 DIAGNOSIS — J342 Deviated nasal septum: Secondary | ICD-10-CM | POA: Diagnosis not present

## 2024-06-04 NOTE — Progress Notes (Signed)
 VIALS MADE 06-04-24

## 2024-06-05 ENCOUNTER — Other Ambulatory Visit: Payer: Self-pay

## 2024-06-05 DIAGNOSIS — Z85850 Personal history of malignant neoplasm of thyroid: Secondary | ICD-10-CM

## 2024-06-05 DIAGNOSIS — E039 Hypothyroidism, unspecified: Secondary | ICD-10-CM

## 2024-06-05 DIAGNOSIS — E890 Postprocedural hypothyroidism: Secondary | ICD-10-CM

## 2024-06-07 DIAGNOSIS — Z85850 Personal history of malignant neoplasm of thyroid: Secondary | ICD-10-CM | POA: Diagnosis not present

## 2024-06-07 DIAGNOSIS — M542 Cervicalgia: Secondary | ICD-10-CM | POA: Diagnosis not present

## 2024-06-07 DIAGNOSIS — E890 Postprocedural hypothyroidism: Secondary | ICD-10-CM | POA: Diagnosis not present

## 2024-06-07 DIAGNOSIS — G43719 Chronic migraine without aura, intractable, without status migrainosus: Secondary | ICD-10-CM | POA: Diagnosis not present

## 2024-06-07 DIAGNOSIS — E039 Hypothyroidism, unspecified: Secondary | ICD-10-CM | POA: Diagnosis not present

## 2024-06-12 ENCOUNTER — Ambulatory Visit: Payer: Self-pay | Admitting: Allergy & Immunology

## 2024-06-12 DIAGNOSIS — J3489 Other specified disorders of nose and nasal sinuses: Secondary | ICD-10-CM

## 2024-06-12 DIAGNOSIS — J324 Chronic pansinusitis: Secondary | ICD-10-CM

## 2024-06-17 ENCOUNTER — Encounter (INDEPENDENT_AMBULATORY_CARE_PROVIDER_SITE_OTHER): Payer: Self-pay

## 2024-06-17 ENCOUNTER — Ambulatory Visit (INDEPENDENT_AMBULATORY_CARE_PROVIDER_SITE_OTHER)

## 2024-06-17 DIAGNOSIS — J309 Allergic rhinitis, unspecified: Secondary | ICD-10-CM

## 2024-06-17 NOTE — Progress Notes (Signed)
 Immunotherapy   Patient Details  Name: Sarah Moran MRN: [id number] Date of Birth: 1980-10-01  06/17/2024  Sarah Moran started injections for  Dust mites Following schedule: B  Frequency:2 times per week Epi-Pen:Epi-Pen Available  Consent signed previously and patient instructions given. Patient sat in the lobby for thirty minutes without an issue.    Sarah Moran 06/17/2024, 10:34 AM

## 2024-06-18 LAB — TSH: TSH: 1.21 u[IU]/mL (ref 0.450–4.500)

## 2024-06-18 LAB — T4, FREE: Free T4: 1.65 ng/dL (ref 0.82–1.77)

## 2024-06-18 LAB — THYROGLOBULIN ANTIBODY: Thyroglobulin Antibody: <1 [IU]/mL (ref 0.0–0.9)

## 2024-06-18 LAB — THYROGLOBULIN LEVEL: Thyroglobulin (TG-RIA): <2 ng/mL

## 2024-06-21 ENCOUNTER — Ambulatory Visit (INDEPENDENT_AMBULATORY_CARE_PROVIDER_SITE_OTHER)

## 2024-06-21 DIAGNOSIS — J309 Allergic rhinitis, unspecified: Secondary | ICD-10-CM | POA: Diagnosis not present

## 2024-06-26 ENCOUNTER — Ambulatory Visit: Payer: Self-pay

## 2024-06-26 DIAGNOSIS — J309 Allergic rhinitis, unspecified: Secondary | ICD-10-CM

## 2024-06-27 ENCOUNTER — Encounter (INDEPENDENT_AMBULATORY_CARE_PROVIDER_SITE_OTHER): Payer: Self-pay | Admitting: Otolaryngology

## 2024-06-27 ENCOUNTER — Ambulatory Visit (INDEPENDENT_AMBULATORY_CARE_PROVIDER_SITE_OTHER): Admitting: Otolaryngology

## 2024-06-27 VITALS — BP 104/70 | HR 78 | Temp 98.0°F | Ht 61.0 in | Wt 142.0 lb

## 2024-06-27 DIAGNOSIS — J3489 Other specified disorders of nose and nasal sinuses: Secondary | ICD-10-CM

## 2024-06-27 DIAGNOSIS — R0982 Postnasal drip: Secondary | ICD-10-CM

## 2024-06-27 DIAGNOSIS — J310 Chronic rhinitis: Secondary | ICD-10-CM | POA: Insufficient documentation

## 2024-06-27 DIAGNOSIS — R0981 Nasal congestion: Secondary | ICD-10-CM

## 2024-06-27 DIAGNOSIS — J342 Deviated nasal septum: Secondary | ICD-10-CM | POA: Diagnosis not present

## 2024-06-27 DIAGNOSIS — J343 Hypertrophy of nasal turbinates: Secondary | ICD-10-CM

## 2024-06-27 DIAGNOSIS — R519 Headache, unspecified: Secondary | ICD-10-CM

## 2024-06-27 NOTE — Progress Notes (Signed)
 CC: Chronic nasal obstruction, recurrent sinusitis  Discussed the use of AI scribe software for clinical note transcription with the patient, who gave verbal consent to proceed.  History of Present Illness Sarah Moran is a 43 year old female who presents today complaining of chronic nasal obstruction and recurrent sinusitis.  She has a long-standing history of chronic sinus issues, including headaches, facial pressure, and nasal congestion. She experiences significant post-nasal drainage, which she describes as 'constantly hacking,' and notes that certain foods exacerbate this condition. She reports swelling in her cheeks, particularly on the left side.  She has been receiving allergy shots for the past two years due to being 'allergic to everything out there.' Despite this, she continues to take Allegra and Xyzal  in the morning, Zyrtec in the evening, and Benadryl as needed, especially after outdoor activities. She also uses nasal sprays such as Flonase  and Nasacort for the past 2 years.  She has a history of frequent sinus infections, which have improved since reducing dairy intake. However, she still experiences constant inflammation. She underwent thyroid  surgery for thyroid  cancer, resulting in complete thyroid  removal.  She reports difficulty breathing through her nose, often resorting to mouth breathing. She also mentions difficulty swallowing pills at times. She has not had any ear, nose, or throat surgeries apart from the thyroid  surgery.  Her recent sinus CT scan showed nasal septal deviation, bilateral inferior turbinate hypertrophy, and a large right concha bullosa.  Her nasal passageways are significantly obstructed bilaterally.  Her paranasal sinuses are clear.  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   Past Medical History:  Diagnosis Date   Allergy    Anal fissure    Anxiety    no meds    Chronic pelvic pain in female    Family history of breast cancer    My Risk neg 5/18   Family history of cervical cancer    Family history of skin cancer    Family history of throat cancer    Frequent headaches    Gallstone    Gallstones    Genetic testing of female 01/2017   My Risk/BRCA neg   GERD (gastroesophageal reflux disease)    well controlled. Takes gingerroot when symptoms   H. pylori infection 06/19/2018   Hypothyroidism    s/p thyroidectomy - levothyroxine  75 mcg daily   IBS (irritable bowel syndrome)    constipation - takes miralax daily   Increased risk of breast cancer 01/2017   IBIS=18.8%/riskscore=21.9%   Liver cyst 04/2018   Migraine    takes excedrin. She has tried imitrex  in the past    Personal history of radiation therapy    Seizure (HCC) 1997    x 1-age 13-after surgery for scoliosis   Thyroid  cancer (HCC) 2007   s/p thyroidectomy    Past Surgical History:  Procedure Laterality Date   ANAL RECTAL MANOMETRY N/A 10/12/2018   Procedure: ANO RECTAL MANOMETRY;  Surgeon: Shila Gustav GAILS, MD;  Location: [facility];  Service: Endoscopy;  Laterality: N/A;   ANTERIOR AND POSTERIOR REPAIR WITH SACROSPINOUS FIXATION N/A 08/02/2018   Procedure: ANTERIOR AND POSTERIOR REPAIR;  Surgeon: Victor Claudell SAUNDERS, MD;  Location: [facility];  Service: Gynecology;  Laterality: N/A;   BACK SURGERY  1997   scoliosis   CHOLECYSTECTOMY N/A 09/21/2018   Procedure: LAPAROSCOPIC CHOLECYSTECTOMY;  Surgeon: Marolyn Nest, MD;  Location: [facility];  Service: General;  Laterality: N/A;  COLONOSCOPY     ESOPHAGOGASTRODUODENOSCOPY (EGD) WITH PROPOFOL  N/A 06/12/2018   Procedure: ESOPHAGOGASTRODUODENOSCOPY (EGD) WITH PROPOFOL ;  Surgeon: Unk Corinn Skiff, MD;  Location: [facility];  Service: Gastroenterology;  Laterality: N/A;   HERNIA REPAIR  1990   bilateral inguinal   PUBOVAGINAL SLING N/A 08/02/2018   Procedure: PUBO-VAGINAL SLING- TVT;  Surgeon: Victor Claudell SAUNDERS, MD;   Location: [facility];  Service: Gynecology;  Laterality: N/A;   THYROIDECTOMY  2007   thyroid  cancer   TOTAL ABDOMINAL HYSTERECTOMY  2008   dymenorrhea    Family History  Problem Relation Age of Onset   Hyperlipidemia Father    Hypertension Father    Clotting disorder Sister    Hepatitis B Brother    Breast cancer Paternal Grandmother 46       with mets   Cervical cancer Mother        dx. in her early 25s   Breast cancer Paternal Aunt 50   Throat cancer Paternal Aunt 38   Breast cancer Paternal Aunt 108   Breast cancer Paternal Aunt        64   Skin cancer Paternal Aunt    Cancer Cousin        unknown type dx. in her late 20s/early 30s   Skin cancer Cousin        13 cancerous moles removed in her 52s    Social History:  reports that she has never smoked. She has never used smokeless tobacco. She reports that she does not currently use alcohol. She reports that she does not use drugs.  Allergies:  Allergies  Allergen Reactions   Darvon [Propoxyphene] Other (See Comments)    Hallucinations    Onion Other (See Comments)    Migraines   Percocet [Oxycodone-Acetaminophen ] Hives   Vicodin [Hydrocodone-Acetaminophen ] Hives    Prior to Admission medications   Medication Sig Start Date End Date Taking? Authorizing Provider  baclofen (LIORESAL) 10 MG tablet Take 10 mg by mouth 2 (two) times daily as needed. 02/08/23  Yes [provider]  Barberry-Oreg Grape-Goldenseal (BERBERINE COMPLEX PO) Take by mouth daily.   Yes [provider]  cetirizine (ZYRTEC ALLERGY) 10 MG tablet Take 10 mg by mouth.   Yes [provider]  Cholecalciferol (VITAMIN D3) 50 MCG (2000 UT) capsule Take 2,000 Units by mouth daily.   Yes [provider]  Cod Liver Oil CAPS Take 1 capsule by mouth at bedtime.    Yes [provider]  EPINEPHrine  (EPIPEN  2-PAK) 0.3 mg/0.3 mL IJ SOAJ injection Inject 0.3 mg into the muscle as needed for anaphylaxis. 11/22/23  Yes  Iva Marty Saltness, MD  Fexofenadine HCl [facility] ALLERGY PO) Take 1 tablet by mouth daily.   Yes [provider]  fluticasone  (FLONASE ) 50 MCG/ACT nasal spray PLACE 2 SPRAYS INTO BOTH NOSTRILS 2 (TWO) TIMES DAILY 03/13/24  Yes Marylynn Verneita CROME, MD  levocetirizine (XYZAL ) 5 MG tablet TAKE 1 TABLET BY MOUTH EVERY DAY IN THE EVENING 04/10/23  Yes Iva Marty Saltness, MD  polyethylene glycol (MIRALAX / GLYCOLAX) 17 g packet Take 17 g by mouth daily.   Yes [provider]  SYNTHROID  88 MCG tablet TAKE 1 TABLET BY MOUTH ONCE DAILY BEFORE BREAKFAST 04/02/24  Yes Nida, Gebreselassie W, MD  pantoprazole  (PROTONIX ) 40 MG tablet Take 1 tablet (40 mg total) by mouth daily. 05/24/24 08/22/24  Iva Marty Saltness, MD    Blood pressure 104/70, pulse 78, temperature 98 F (36.7 C), temperature source Oral, height  5' 1 (1.549 m), weight 142 lb (64.4 kg), SpO2 98%. Exam: General: Communicates without difficulty, well nourished, no acute distress. Head: Normocephalic, no evidence injury, no tenderness, facial buttresses intact without stepoff. Face/sinus: No tenderness to palpation and percussion. Facial movement is normal and symmetric. Eyes: PERRL, EOMI. No scleral icterus, conjunctivae clear. Neuro: CN II exam reveals vision grossly intact.  No nystagmus at any point of gaze. Ears: Auricles well formed without lesions.  Ear canals are intact without mass or lesion.  No erythema or edema is appreciated.  The TMs are intact without fluid. Nose: External evaluation reveals normal support and skin without lesions.  Dorsum is intact.  Anterior rhinoscopy reveals congested mucosa over anterior aspect of inferior turbinates and deviated septum.  No purulence noted. Oral:  Oral cavity and oropharynx are intact, symmetric, without erythema or edema.  Mucosa is moist without lesions. Neck: Full range of motion without pain.  There is no significant lymphadenopathy.  No masses palpable.  Thyroid  bed within  normal limits to palpation.  Parotid glands and submandibular glands equal bilaterally without mass.  Trachea is midline. Neuro:  CN 2-12 grossly intact.   Procedure:  Flexible Nasal Endoscopy: Description: Risks, benefits, and alternatives of flexible endoscopy were explained to the patient.  Specific mention was made of the risk of throat numbness with difficulty swallowing, possible bleeding from the nose and mouth, and pain from the procedure.  The patient gave oral consent to proceed.  The flexible scope was inserted into the right nasal cavity.  Endoscopy of the interior nasal cavity, superior, inferior, and middle meatus was performed. The sphenoid-ethmoid recess was examined. Edematous mucosa was noted.  No polyp, mass, or lesion was appreciated. Nasal septal deviation noted. Olfactory cleft was clear.  Nasopharynx was clear.  Turbinates were hypertrophied but without mass.  The procedure was repeated on the contralateral side with similar findings.  The patient tolerated the procedure well.   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 Symptoms have persisted for years, which has worsened over the past four months. CT scan confirms septal deviation, turbinate hypertrophy, and a large right concha bullosa, causing significant obstruction. Sinuses are clear, indicating the problem is localized to the nasal passage. Previous medical management with nasal sprays and allergy medications has been insufficient, indicating a structural issue requiring surgical intervention. - The treatment options include continuing medical management versus surgical intervention with septoplasty, bilateral inferior turbinate reduction, and endoscopic right concha bullosa resection. - The risk, benefits,  alternatives, and details of the procedures are extensively discussed.  Questions are invited and answered. - The patient would like to proceed with procedures.  We will schedule the procedures in accordance with the patient's schedule. - Advise to avoid heavy lifting for 3-4 days post-surgery to minimize bleeding. - Inform that surgery will not address allergy symptoms, and continued allergy management will be necessary.   Sarah Moran 06/27/2024, 2:22 PM

## 2024-07-02 ENCOUNTER — Other Ambulatory Visit: Payer: Self-pay | Admitting: "Endocrinology

## 2024-07-03 ENCOUNTER — Ambulatory Visit (INDEPENDENT_AMBULATORY_CARE_PROVIDER_SITE_OTHER): Payer: Self-pay

## 2024-07-03 DIAGNOSIS — J309 Allergic rhinitis, unspecified: Secondary | ICD-10-CM

## 2024-07-10 ENCOUNTER — Ambulatory Visit (INDEPENDENT_AMBULATORY_CARE_PROVIDER_SITE_OTHER): Payer: Self-pay

## 2024-07-10 DIAGNOSIS — J309 Allergic rhinitis, unspecified: Secondary | ICD-10-CM

## 2024-07-11 DIAGNOSIS — M542 Cervicalgia: Secondary | ICD-10-CM | POA: Diagnosis not present

## 2024-07-11 DIAGNOSIS — G43719 Chronic migraine without aura, intractable, without status migrainosus: Secondary | ICD-10-CM | POA: Diagnosis not present

## 2024-07-12 ENCOUNTER — Ambulatory Visit (INDEPENDENT_AMBULATORY_CARE_PROVIDER_SITE_OTHER): Payer: Self-pay

## 2024-07-12 DIAGNOSIS — J309 Allergic rhinitis, unspecified: Secondary | ICD-10-CM | POA: Diagnosis not present

## 2024-07-15 ENCOUNTER — Encounter (HOSPITAL_BASED_OUTPATIENT_CLINIC_OR_DEPARTMENT_OTHER): Payer: Self-pay | Admitting: Otolaryngology

## 2024-07-15 ENCOUNTER — Other Ambulatory Visit: Payer: Self-pay

## 2024-07-17 ENCOUNTER — Ambulatory Visit (INDEPENDENT_AMBULATORY_CARE_PROVIDER_SITE_OTHER): Payer: Self-pay

## 2024-07-17 DIAGNOSIS — J309 Allergic rhinitis, unspecified: Secondary | ICD-10-CM

## 2024-07-19 ENCOUNTER — Ambulatory Visit (INDEPENDENT_AMBULATORY_CARE_PROVIDER_SITE_OTHER)

## 2024-07-19 DIAGNOSIS — J309 Allergic rhinitis, unspecified: Secondary | ICD-10-CM | POA: Diagnosis not present

## 2024-07-22 ENCOUNTER — Other Ambulatory Visit: Payer: Self-pay

## 2024-07-22 ENCOUNTER — Encounter (HOSPITAL_BASED_OUTPATIENT_CLINIC_OR_DEPARTMENT_OTHER)
Admission: RE | Disposition: A | Discharge status: Home / Self Care | Payer: Self-pay | Source: Home / Self Care | Attending: Otolaryngology

## 2024-07-22 ENCOUNTER — Ambulatory Visit (HOSPITAL_BASED_OUTPATIENT_CLINIC_OR_DEPARTMENT_OTHER)
Admission: RE | Admit: 2024-07-22 | Discharge: 2024-07-22 | Disposition: A | Discharge status: Home / Self Care | Attending: Otolaryngology | Admitting: Otolaryngology

## 2024-07-22 ENCOUNTER — Ambulatory Visit (HOSPITAL_BASED_OUTPATIENT_CLINIC_OR_DEPARTMENT_OTHER): Payer: Self-pay | Admitting: Anesthesiology

## 2024-07-22 ENCOUNTER — Encounter (HOSPITAL_BASED_OUTPATIENT_CLINIC_OR_DEPARTMENT_OTHER): Payer: Self-pay | Admitting: Otolaryngology

## 2024-07-22 DIAGNOSIS — Z85850 Personal history of malignant neoplasm of thyroid: Secondary | ICD-10-CM | POA: Diagnosis not present

## 2024-07-22 DIAGNOSIS — J3489 Other specified disorders of nose and nasal sinuses: Secondary | ICD-10-CM | POA: Insufficient documentation

## 2024-07-22 DIAGNOSIS — E890 Postprocedural hypothyroidism: Secondary | ICD-10-CM | POA: Diagnosis not present

## 2024-07-22 DIAGNOSIS — J343 Hypertrophy of nasal turbinates: Secondary | ICD-10-CM | POA: Diagnosis not present

## 2024-07-22 DIAGNOSIS — J342 Deviated nasal septum: Secondary | ICD-10-CM | POA: Insufficient documentation

## 2024-07-22 DIAGNOSIS — Z79899 Other long term (current) drug therapy: Secondary | ICD-10-CM | POA: Diagnosis not present

## 2024-07-22 HISTORY — PX: ENDOSCOPIC CONCHA BULLOSA RESECTION: SHX6395

## 2024-07-22 HISTORY — PX: NASAL SEPTOPLASTY W/ TURBINOPLASTY: SHX2070

## 2024-07-22 SURGERY — SEPTOPLASTY, NOSE, WITH NASAL TURBINATE REDUCTION
Anesthesia: General | Site: Nose | Laterality: Right

## 2024-07-22 MED ORDER — DEXAMETHASONE SOD PHOSPHATE PF 10 MG/ML IJ SOLN
INTRAMUSCULAR | Status: DC | PRN
  Administered 2024-07-22: 10 mg via INTRAVENOUS

## 2024-07-22 MED ORDER — MIDAZOLAM HCL 2 MG/2ML IJ SOLN
INTRAMUSCULAR | Status: AC
  Filled 2024-07-22: qty 2

## 2024-07-22 MED ORDER — OXYMETAZOLINE HCL 0.05 % NA SOLN
NASAL | Status: DC | PRN
  Administered 2024-07-22: 1 via TOPICAL

## 2024-07-22 MED ORDER — ROCURONIUM 10MG/ML (10ML) SYRINGE FOR MEDFUSION PUMP - OPTIME
INTRAVENOUS | Status: DC | PRN
  Administered 2024-07-22: 50 mg via INTRAVENOUS

## 2024-07-22 MED ORDER — TRAMADOL HCL 50 MG PO TABS
50.0000 mg | ORAL_TABLET | Freq: Once | ORAL | Status: AC
  Administered 2024-07-22: 50 mg via ORAL

## 2024-07-22 MED ORDER — ONDANSETRON HCL 4 MG/2ML IJ SOLN
INTRAMUSCULAR | Status: DC | PRN
  Administered 2024-07-22: 4 mg via INTRAVENOUS

## 2024-07-22 MED ORDER — FENTANYL CITRATE (PF) 100 MCG/2ML IJ SOLN
25.0000 ug | INTRAMUSCULAR | Status: DC | PRN
  Administered 2024-07-22 (×2): 50 ug via INTRAVENOUS

## 2024-07-22 MED ORDER — CEFAZOLIN SODIUM 1 G IJ SOLR
INTRAMUSCULAR | Status: AC
  Filled 2024-07-22: qty 50

## 2024-07-22 MED ORDER — LIDOCAINE HCL (CARDIAC) PF 100 MG/5ML IV SOSY
PREFILLED_SYRINGE | INTRAVENOUS | Status: DC | PRN
  Administered 2024-07-22: 100 mg via INTRATRACHEAL

## 2024-07-22 MED ORDER — MIDAZOLAM HCL (PF) 2 MG/2ML IJ SOLN
INTRAMUSCULAR | Status: DC | PRN
  Administered 2024-07-22: 2 mg via INTRAVENOUS

## 2024-07-22 MED ORDER — DEXMEDETOMIDINE HCL IN NACL 80 MCG/20ML IV SOLN
INTRAVENOUS | Status: DC | PRN
  Administered 2024-07-22: 8 ug via INTRAVENOUS

## 2024-07-22 MED ORDER — ACETAMINOPHEN 500 MG PO TABS
ORAL_TABLET | ORAL | Status: AC
  Filled 2024-07-22: qty 2

## 2024-07-22 MED ORDER — ACETAMINOPHEN 500 MG PO TABS
1000.0000 mg | ORAL_TABLET | Freq: Once | ORAL | Status: AC
Start: 2024-07-22 — End: 2024-07-22
  Administered 2024-07-22: 1000 mg via ORAL

## 2024-07-22 MED ORDER — AMISULPRIDE (ANTIEMETIC) 5 MG/2ML IV SOLN: 10.0000 mg | Freq: Once | INTRAVENOUS | Status: DC | PRN

## 2024-07-22 MED ORDER — LIDOCAINE-EPINEPHRINE 1 %-1:100000 IJ SOLN
INTRAMUSCULAR | Status: DC | PRN
  Administered 2024-07-22: 3 mL

## 2024-07-22 MED ORDER — PHENYLEPHRINE HCL (PRESSORS) 10 MG/ML IV SOLN
INTRAVENOUS | Status: DC | PRN
Start: 2024-07-22 — End: 2024-07-22
  Administered 2024-07-22 (×4): 80 ug via INTRAVENOUS

## 2024-07-22 MED ORDER — LACTATED RINGERS IV SOLN: INTRAVENOUS | Status: DC

## 2024-07-22 MED ORDER — TRAMADOL HCL 50 MG PO TABS
ORAL_TABLET | ORAL | Status: AC
  Filled 2024-07-22: qty 1

## 2024-07-22 MED ORDER — LIDOCAINE 2% (20 MG/ML) 5 ML SYRINGE
INTRAMUSCULAR | Status: AC
  Filled 2024-07-22: qty 5

## 2024-07-22 MED ORDER — SUGAMMADEX SODIUM 200 MG/2ML IV SOLN
INTRAVENOUS | Status: DC | PRN
  Administered 2024-07-22: 150 mg via INTRAVENOUS

## 2024-07-22 MED ORDER — FENTANYL CITRATE (PF) 100 MCG/2ML IJ SOLN
INTRAMUSCULAR | Status: AC
  Filled 2024-07-22: qty 2

## 2024-07-22 MED ORDER — ONDANSETRON HCL 4 MG/2ML IJ SOLN
INTRAMUSCULAR | Status: AC
  Filled 2024-07-22: qty 4

## 2024-07-22 MED ORDER — FENTANYL CITRATE (PF) 100 MCG/2ML IJ SOLN
INTRAMUSCULAR | Status: DC | PRN
  Administered 2024-07-22 (×2): 50 ug via INTRAVENOUS

## 2024-07-22 MED ORDER — MUPIROCIN 2 % EX OINT
TOPICAL_OINTMENT | CUTANEOUS | Status: DC | PRN
  Administered 2024-07-22: 1 via NASAL

## 2024-07-22 MED ORDER — PROPOFOL 10 MG/ML IV BOLUS
INTRAVENOUS | Status: DC | PRN
  Administered 2024-07-22: 150 ug via INTRAVENOUS
  Administered 2024-07-22: 160 ug via INTRAVENOUS

## 2024-07-22 MED ORDER — CEFAZOLIN SODIUM-DEXTROSE 2-3 GM-%(50ML) IV SOLR
INTRAVENOUS | Status: DC | PRN
  Administered 2024-07-22: 2 g via INTRAVENOUS

## 2024-07-22 SURGICAL SUPPLY — 36 items
BLADE TRICUT ROTATE M4 4 5PK (BLADE) IMPLANT
CANISTER SUC SOCK COL 7IN (MISCELLANEOUS) IMPLANT
CANISTER SUCT 1200ML W/VALVE (MISCELLANEOUS) ×2 IMPLANT
COAGULATOR SUCT 8FR VV (MISCELLANEOUS) ×2 IMPLANT
DEFOGGER MIRROR 1QT (MISCELLANEOUS) ×4 IMPLANT
DRSG NASOPORE 8CM (GAUZE/BANDAGES/DRESSINGS) IMPLANT
DRSG TELFA 3X8 NADH STRL (GAUZE/BANDAGES/DRESSINGS) IMPLANT
ELECTRODE REM PT RTRN 9FT ADLT (ELECTROSURGICAL) ×2 IMPLANT
GAUZE SPONGE 2X2 STRL 8-PLY (GAUZE/BANDAGES/DRESSINGS) ×2 IMPLANT
GLOVE BIO SURGEON STRL SZ7.5 (GLOVE) ×2 IMPLANT
GOWN STRL REUS W/ TWL LRG LVL3 (GOWN DISPOSABLE) ×4 IMPLANT
HEMOSTAT SURGICEL 2X14 (HEMOSTASIS) IMPLANT
IV NS 500ML BAXH (IV SOLUTION) IMPLANT
IV SET EXT 30 76VOL 4 MALE LL (IV SETS) IMPLANT
NDL HYPO 25X1 1.5 SAFETY (NEEDLE) ×2 IMPLANT
NDL SPNL 25GX3.5 QUINCKE BL (NEEDLE) IMPLANT
NEEDLE HYPO 25X1 1.5 SAFETY (NEEDLE) ×2 IMPLANT
NEEDLE SPNL 25GX3.5 QUINCKE BL (NEEDLE) IMPLANT
PACK BASIN DAY SURGERY FS (CUSTOM PROCEDURE TRAY) ×2 IMPLANT
PACK ENT DAY SURGERY (CUSTOM PROCEDURE TRAY) ×2 IMPLANT
SLEEVE SCD COMPRESS KNEE MED (STOCKING) IMPLANT
SOLN 0.9% NACL POUR BTL 1000ML (IV SOLUTION) ×2 IMPLANT
SPIKE FLUID TRANSFER (MISCELLANEOUS) IMPLANT
SPLINT NASAL AIRWAY SILICONE (MISCELLANEOUS) ×2 IMPLANT
SPONGE NEURO XRAY DETECT 1X3 (DISPOSABLE) ×2 IMPLANT
SUCTION TUBE FRAZIER 10FR DISP (SUCTIONS) IMPLANT
SUT CHROMIC 4 0 RB 1X27 (SUTURE) ×2 IMPLANT
SUT PLAIN 4 0 ~~LOC~~ 1 (SUTURE) ×2 IMPLANT
SUT PROLENE 3 0 PS 2 (SUTURE) ×2 IMPLANT
SUT VIC AB 4-0 P-3 18XBRD (SUTURE) IMPLANT
SYR 50ML LL SCALE MARK (SYRINGE) IMPLANT
TOWEL GREEN STERILE FF (TOWEL DISPOSABLE) ×2 IMPLANT
TUBE CONNECTING 20X1/4 (TUBING) IMPLANT
TUBE SALEM SUMP 12FR 48 (TUBING) IMPLANT
TUBE SALEM SUMP 16F (TUBING) ×2 IMPLANT
YANKAUER SUCT BULB TIP NO VENT (SUCTIONS) ×2 IMPLANT

## 2024-07-22 NOTE — H&P (Signed)
 CC: Chronic nasal obstruction, recurrent sinusitis   Discussed the use of AI scribe software for clinical note transcription with the patient, who gave verbal consent to proceed.   History of Present Illness Sarah Moran is a 43 year old female who presents today complaining of chronic nasal obstruction and recurrent sinusitis.   She has a long-standing history of chronic sinus issues, including headaches, facial pressure, and nasal congestion. She experiences significant post-nasal drainage, which she describes as 'constantly hacking,' and notes that certain foods exacerbate this condition. She reports swelling in her cheeks, particularly on the left side.   She has been receiving allergy shots for the past two years due to being 'allergic to everything out there.' Despite this, she continues to take Allegra and Xyzal  in the morning, Zyrtec in the evening, and Benadryl as needed, especially after outdoor activities. She also uses nasal sprays such as Flonase  and Nasacort for the past 2 years.   She has a history of frequent sinus infections, which have improved since reducing dairy intake. However, she still experiences constant inflammation. She underwent thyroid  surgery for thyroid  cancer, resulting in complete thyroid  removal.   She reports difficulty breathing through her nose, often resorting to mouth breathing. She also mentions difficulty swallowing pills at times. She has not had any ear, nose, or throat surgeries apart from the thyroid  surgery.   Her recent sinus CT scan showed nasal septal deviation, bilateral inferior turbinate hypertrophy, and a large right concha bullosa.  Her nasal passageways are significantly obstructed bilaterally.  Her paranasal sinuses are clear.   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         Past Medical History:  Diagnosis Date   Allergy     Anal fissure     Anxiety       no meds   Chronic pelvic pain in female     Family history of breast cancer      My Risk neg 5/18   Family history of cervical cancer     Family history of skin cancer     Family history of throat cancer     Frequent headaches     Gallstone     Gallstones     Genetic testing of female 01/2017    My Risk/BRCA neg   GERD (gastroesophageal reflux disease)      well controlled. Takes gingerroot when symptoms   H. pylori infection 06/19/2018   Hypothyroidism      s/p thyroidectomy - levothyroxine  75 mcg daily   IBS (irritable bowel syndrome)      constipation - takes miralax daily   Increased risk of breast cancer 01/2017    IBIS=18.8%/riskscore=21.9%   Liver cyst 04/2018   Migraine      takes excedrin. She has tried imitrex  in the past    Personal history of radiation therapy     Seizure (HCC) 1997     x 1-age 26-after surgery for scoliosis   Thyroid  cancer (HCC) 2007    s/p thyroidectomy               Past Surgical History:  Procedure Laterality Date   ANAL RECTAL MANOMETRY N/A 10/12/2018    Procedure: ANO RECTAL MANOMETRY;  Surgeon: Shila Gustav GAILS, MD;  Location: [facility];  Service: Endoscopy;  Laterality: N/A;   ANTERIOR AND POSTERIOR REPAIR WITH SACROSPINOUS FIXATION N/A 08/02/2018    Procedure: ANTERIOR AND POSTERIOR  REPAIR;  Surgeon: Victor Claudell SAUNDERS, MD;  Location: [facility];  Service: Gynecology;  Laterality: N/A;   BACK SURGERY   1997    scoliosis   CHOLECYSTECTOMY N/A 09/21/2018    Procedure: LAPAROSCOPIC CHOLECYSTECTOMY;  Surgeon: Marolyn Nest, MD;  Location: [facility];  Service: General;  Laterality: N/A;   COLONOSCOPY       ESOPHAGOGASTRODUODENOSCOPY (EGD) WITH PROPOFOL  N/A 06/12/2018    Procedure: ESOPHAGOGASTRODUODENOSCOPY (EGD) WITH PROPOFOL ;  Surgeon: Unk Corinn Skiff, MD;  Location: [facility];  Service: Gastroenterology;  Laterality: N/A;   HERNIA REPAIR   1990    bilateral inguinal   PUBOVAGINAL SLING N/A 08/02/2018     Procedure: PUBO-VAGINAL SLING- TVT;  Surgeon: Victor Claudell SAUNDERS, MD;  Location: [facility];  Service: Gynecology;  Laterality: N/A;   THYROIDECTOMY   2007    thyroid  cancer   TOTAL ABDOMINAL HYSTERECTOMY   2008    dymenorrhea               Family History  Problem Relation Age of Onset   Hyperlipidemia Father     Hypertension Father     Clotting disorder Sister     Hepatitis B Brother     Breast cancer Paternal Grandmother 74        with mets   Cervical cancer Mother          dx. in her early 14s   Breast cancer Paternal Aunt 50   Throat cancer Paternal Aunt 30   Breast cancer Paternal Aunt 36   Breast cancer Paternal Aunt          41   Skin cancer Paternal Aunt     Cancer Cousin          unknown type dx. in her late 20s/early 30s   Skin cancer Cousin          13 cancerous moles removed in her 28s          Social History:  reports that she has never smoked. She has never used smokeless tobacco. She reports that she does not currently use alcohol. She reports that she does not use drugs.   Allergies:  Allergies       Allergies  Allergen Reactions   Darvon [Propoxyphene] Other (See Comments)      Hallucinations     Onion Other (See Comments)      Migraines   Percocet [Oxycodone-Acetaminophen ] Hives   Vicodin [Hydrocodone-Acetaminophen ] Hives               Prior to Admission medications   Medication Sig Start Date End Date Taking? Authorizing Provider  baclofen (LIORESAL) 10 MG tablet Take 10 mg by mouth 2 (two) times daily as needed. 02/08/23   Yes [provider]  Barberry-Oreg Grape-Goldenseal (BERBERINE COMPLEX PO) Take by mouth daily.     Yes [provider]  cetirizine (ZYRTEC ALLERGY) 10 MG tablet Take 10 mg by mouth.     Yes [provider]  Cholecalciferol (VITAMIN D3) 50 MCG (2000 UT) capsule Take 2,000 Units by mouth daily.     Yes [provider]  Cod Liver Oil CAPS Take 1 capsule by mouth at bedtime.      Yes  [provider]  EPINEPHrine  (EPIPEN  2-PAK) 0.3 mg/0.3 mL IJ SOAJ injection Inject 0.3 mg into the muscle as needed for anaphylaxis. 11/22/23   Yes Iva Marty Saltness, MD  Fexofenadine HCl [facility] ALLERGY PO) Take 1 tablet by mouth daily.  Yes [provider]  fluticasone  (FLONASE ) 50 MCG/ACT nasal spray PLACE 2 SPRAYS INTO BOTH NOSTRILS 2 (TWO) TIMES DAILY 03/13/24   Yes Marylynn Verneita CROME, MD  levocetirizine (XYZAL ) 5 MG tablet TAKE 1 TABLET BY MOUTH EVERY DAY IN THE EVENING 04/10/23   Yes Iva Marty Saltness, MD  polyethylene glycol (MIRALAX / GLYCOLAX) 17 g packet Take 17 g by mouth daily.     Yes [provider]  SYNTHROID  88 MCG tablet TAKE 1 TABLET BY MOUTH ONCE DAILY BEFORE BREAKFAST 04/02/24   Yes Nida, Gebreselassie W, MD  pantoprazole  (PROTONIX ) 40 MG tablet Take 1 tablet (40 mg total) by mouth daily. 05/24/24 08/22/24   Iva Marty Saltness, MD      Blood pressure 104/70, pulse 78, temperature 98 F (36.7 C), temperature source Oral, height 5' 1 (1.549 m), weight 142 lb (64.4 kg), SpO2 98%. Exam: General: Communicates without difficulty, well nourished, no acute distress. Head: Normocephalic, no evidence injury, no tenderness, facial buttresses intact without stepoff. Face/sinus: No tenderness to palpation and percussion. Facial movement is normal and symmetric. Eyes: PERRL, EOMI. No scleral icterus, conjunctivae clear. Neuro: CN II exam reveals vision grossly intact.  No nystagmus at any point of gaze. Ears: Auricles well formed without lesions.  Ear canals are intact without mass or lesion.  No erythema or edema is appreciated.  The TMs are intact without fluid. Nose: External evaluation reveals normal support and skin without lesions.  Dorsum is intact.  Anterior rhinoscopy reveals congested mucosa over anterior aspect of inferior turbinates and deviated septum.  No purulence noted. Oral:  Oral cavity and oropharynx are intact, symmetric, without erythema or  edema.  Mucosa is moist without lesions. Neck: Full range of motion without pain.  There is no significant lymphadenopathy.  No masses palpable.  Thyroid  bed within normal limits to palpation.  Parotid glands and submandibular glands equal bilaterally without mass.  Trachea is midline. Neuro:  CN 2-12 grossly intact.    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 Symptoms have persisted for years, which has worsened over the past four months. CT scan confirms septal deviation, turbinate hypertrophy, and a large right concha bullosa, causing significant obstruction. Sinuses are clear, indicating the problem is localized to the nasal passage. Previous medical management with nasal sprays and allergy medications has been insufficient, indicating a structural issue requiring surgical intervention. - The treatment options include continuing medical management versus surgical intervention with septoplasty, bilateral inferior turbinate reduction, and endoscopic right concha bullosa resection. - The risk, benefits, alternatives, and details of the procedures are extensively discussed.  Questions are invited and answered. - The patient would like to proceed with procedures.

## 2024-07-22 NOTE — Anesthesia Preprocedure Evaluation (Addendum)
 Anesthesia Evaluation  Patient identified by MRN, date of birth, ID band Patient awake    Reviewed: Allergy & Precautions, NPO status , Patient's Chart, lab work & pertinent test results  History of Anesthesia Complications Negative for: history of anesthetic complications  Airway Mallampati: I  TM Distance: >3 FB Neck ROM: Full   Comment: Previous grade I view with MAC 3, easy mask Dental  (+) Dental Advisory Given   Pulmonary neg pulmonary ROS   Pulmonary exam normal breath sounds clear to auscultation       Cardiovascular (-) hypertension(-) angina (-) Past MI, (-) Cardiac Stents and (-) CABG (-) dysrhythmias  Rhythm:Regular Rate:Normal  HLD   Neuro/Psych  Headaches, Seizures - (x1 at age 43 after surgery for scoliosis), Well Controlled,  PSYCHIATRIC DISORDERS Anxiety Depression    Vertigo     GI/Hepatic Neg liver ROS,GERD  Medicated,,IBS   Endo/Other  neg diabetesHypothyroidism  H/o thyroid  cancer  Renal/GU negative Renal ROS     Musculoskeletal Scoliosis    Abdominal   Peds  Hematology negative hematology ROS (+) REFUSES BLOOD PRODUCTS, JEHOVAH'S WITNESS  Anesthesia Other Findings   Reproductive/Obstetrics                              Anesthesia Physical Anesthesia Plan  ASA: 2  Anesthesia Plan: General   Post-op Pain Management: Tylenol  PO (pre-op)*   Induction: Intravenous  PONV Risk Score and Plan: 3 and Ondansetron , Dexamethasone  and Treatment may vary due to age or medical condition  Airway Management Planned: Oral ETT  Additional Equipment:   Intra-op Plan:   Post-operative Plan: Extubation in OR  Informed Consent: I have reviewed the patients History and Physical, chart, labs and discussed the procedure including the risks, benefits and alternatives for the proposed anesthesia with the patient or authorized representative who has indicated his/her understanding  and acceptance.     Dental advisory given  Plan Discussed with: CRNA and Anesthesiologist  Anesthesia Plan Comments: (Risks of general anesthesia discussed including, but not limited to, sore throat, hoarse voice, chipped/damaged teeth, injury to vocal cords, nausea and vomiting, allergic reactions, lung infection, heart attack, stroke, and death. All questions answered.  Patient is Jehovah's Witness and refuses blood products. She will accept albumin. Discussed with patient and her husband.)         Anesthesia Quick Evaluation

## 2024-07-22 NOTE — Discharge Instructions (Addendum)
 Post Anesthesia Home Care Instructions  Activity: Get plenty of rest for the remainder of the day. A responsible individual must stay with you for 24 hours following the procedure.  For the next 24 hours, DO NOT: -Drive a car -Advertising copywriter -Drink alcoholic beverages -Take any medication unless instructed by your physician -Make any legal decisions or sign important papers.  Meals: Start with liquid foods such as gelatin or soup. Progress to regular foods as tolerated. Avoid greasy, spicy, heavy foods. If nausea and/or vomiting occur, drink only clear liquids until the nausea and/or vomiting subsides. Call your physician if vomiting continues.  Special Instructions/Symptoms: Your throat may feel dry or sore from the anesthesia or the breathing tube placed in your throat during surgery. If this causes discomfort, gargle with warm salt water. The discomfort should disappear within 24 hours.  If you had a scopolamine patch placed behind your ear for the management of post- operative nausea and/or vomiting:  1. The medication in the patch is effective for 72 hours, after which it should be removed.  Wrap patch in a tissue and discard in the trash. Wash hands thoroughly with soap and water. 2. You may remove the patch earlier than 72 hours if you experience unpleasant side effects which may include dry mouth, dizziness or visual disturbances. 3. Avoid touching the patch. Wash your hands with soap and water after contact with the patch.  No tylenol  until after 2:30 today if needed.     -------------------------  POSTOPERATIVE INSTRUCTIONS FOR PATIENTS HAVING NASAL OR SINUS OPERATIONS ACTIVITY: Restrict activity at home for the first two days, resting as much as possible. Light activity is best. You may usually return to work within a week. You should refrain from nose blowing, strenuous activity, or heavy lifting greater than 20lbs for a total of one week after your operation.  If  sneezing cannot be avoided, sneeze with your mouth open. DISCOMFORT: You may experience a dull headache and pressure along with nasal congestion and discharge. These symptoms may be worse during the first week after the operation but may last as long as two to four weeks.  Please take Tylenol  or the pain medication that has been prescribed for you. Do not take aspirin  or aspirin  containing medications since they may cause bleeding.  You may experience symptoms of post nasal drainage, nasal congestion, headaches and fatigue for two or three months after your operation.  BLEEDING: You may have some blood tinged nasal drainage for approximately two weeks after the operation.  The discharge will be worse for the first week.  Please call our office at [phone] or go to the nearest hospital emergency room if you experience any of the following: heavy, bright red blood from your nose or mouth that lasts longer than 15 minutes or coughing up or vomiting bright red blood or blood clots. GENERAL CONSIDERATIONS: A gauze dressing will be placed on your upper lip to absorb any drainage after the operation. You may need to change this several times a day.  If you do not have very much drainage, you may remove the dressing.  Remember that you may gently wipe your nose with a tissue and sniff in, but DO NOT blow your nose. Please keep all of your postoperative appointments.  Your final results after the operation will depend on proper follow-up.  The initial visit is usually 2 to 5 days after the operation.  During this visit, the remaining nasal packing and internal septal splints  will be removed.  Your nasal and sinus cavities will be cleaned.  During the second visit, your nasal and sinus cavities will be cleaned again. Have someone drive you to your first two postoperative appointments.  How you care for your nose after the operation will influence the results that you obtain.  You should follow all directions, take  your medication as prescribed, and call our office [phone] with any problems or questions. You may be more comfortable sleeping with your head elevated on two pillows. Do not take any medications that we have not prescribed or recommended. WARNING SIGNS: if any of the following should occur, please call our office: Persistent fever greater than 102F. Persistent vomiting. Severe and constant pain that is not relieved by prescribed pain medication. Trauma to the nose. Rash or unusual side effects from any medicines.

## 2024-07-22 NOTE — Transfer of Care (Signed)
 Immediate Anesthesia Transfer of Care Note  Patient: Sarah Moran  Procedure(s) Performed: SEPTOPLASTY, NOSE, WITH NASAL TURBINATE REDUCTION (Bilateral: Nose) EXCISION, CONCHA BULLOSA, ENDOSCOPIC (Right: Nose)  Patient Location: PACU  Anesthesia Type:General  Level of Consciousness: awake  Airway & Oxygen Therapy: Patient connected to face mask oxygen  Post-op Assessment: Report given to RN and Post -op Vital signs reviewed and stable  Post vital signs: Reviewed and stable  Last Vitals:  Vitals Value Taken Time  BP 124/79 07/22/24 11:00  Temp 36.3 C 07/22/24 10:57  Pulse 87 07/22/24 11:01  Resp 13 07/22/24 11:01  SpO2 96 % 07/22/24 11:01  Vitals shown include unfiled device data.  Last Pain:  Vitals:   07/22/24 0822  TempSrc: Temporal  PainSc: 0-No pain      Patients Stated Pain Goal: 3 (07/22/24 9177)  Complications: No notable events documented.

## 2024-07-22 NOTE — Op Note (Signed)
 DATE OF PROCEDURE: 07/22/2024  OPERATIVE REPORT   SURGEON: Daniel Moccasin, MD   PREOPERATIVE DIAGNOSES:  1.  Nasal septal deviation.  2.  Bilateral inferior turbinate hypertrophy.  3.  Right concha bullosa. 4.  Chronic nasal obstruction.  POSTOPERATIVE DIAGNOSES:  1.  Nasal septal deviation.  2.  Bilateral inferior turbinate hypertrophy.  3.  Right concha bullosa. 4.  Chronic nasal obstruction.  PROCEDURE PERFORMED:  1. Septoplasty.  2. Bilateral partial inferior turbinate resection.  3.  Right endoscopic concha bullosa resection.  ANESTHESIA: General endotracheal tube anesthesia.   COMPLICATIONS: None.   ESTIMATED BLOOD LOSS: 150 mL.   INDICATION FOR PROCEDURE: Sarah Moran is a 43 y.o. female with a history of chronic nasal obstruction. The patient was treated with allergy shots, antihistamine, decongestant, and steroid nasal sprays. However, the patient continued to be symptomatic. On examination, the patient was noted to have bilateral severe inferior turbinate hypertrophy and significant nasal septal deviation, causing significant nasal obstruction.  Her CT scan also showed a large right concha bullosa.  Based on the above findings, the decision was made for the patient to undergo the above-stated procedures. The risks, benefits, alternatives, and details of the procedures were discussed with the patient. Questions were invited and answered. Informed consent was obtained.   DESCRIPTION OF PROCEDURE: The patient was taken to the operating room and placed supine on the operating table. General endotracheal tube anesthesia was administered by the anesthesiologist. The patient was positioned, and prepped and draped in the standard fashion for nasal surgery. Pledgets soaked with Afrin were placed in both nasal cavities for decongestion. The pledgets were subsequently removed.   Examination of the nasal cavity revealed a severe nasal septal deviation. 1% lidocaine  with 1:100,000  epinephrine  was injected onto the nasal septum bilaterally. A hemitransfixion incision was made on the left side. The mucosal flap was carefully elevated on the left side. A cartilaginous incision was made 1 cm superior to the caudal margin of the nasal septum. Mucosal flap was also elevated on the right side in the similar fashion. It should be noted that due to the severe septal deviation, the deviated portion of the cartilaginous and bony septum had to be removed in piecemeal fashion. Once the deviated portions were removed, a straight midline septum was achieved. The septum was then quilted with 4-0 plain gut sutures. The hemitransfixion incision was closed with interrupted 4-0 chromic sutures.   The inferior one half of both hypertrophied inferior turbinate was crossclamped with a Kelly clamp. The inferior one half of each inferior turbinate was then resected with a pair of cross cutting scissors. Hemostasis was achieved with a suction cautery device.   Using a 0 endoscope, the right nasal cavity was examined. A large concha bullosa was noted. Using Tru-Cut forceps, the inferior one third and medial one half of the concha bullosa was resected.  The Doyle splints were applied to the nasal septum.  The care of the patient was turned over to the anesthesiologist. The patient was awakened from anesthesia without difficulty. The patient was extubated and transferred to the recovery room in good condition.   OPERATIVE FINDINGS: Nasal septal deviation and bilateral inferior turbinate hypertrophy.  Right concha bullosa.  SPECIMEN: None.   FOLLOWUP CARE: The patient be discharged home once she is awake and alert. The patient will follow up in my office in 3 to days for splint removal.   Graig Hessling Dois Moccasin, MD

## 2024-07-22 NOTE — Anesthesia Postprocedure Evaluation (Signed)
 Anesthesia Post Note  Patient: Sarah Moran  Procedure(s) Performed: SEPTOPLASTY, NOSE, WITH NASAL TURBINATE REDUCTION (Bilateral: Nose) EXCISION, CONCHA BULLOSA, ENDOSCOPIC (Right: Nose)     Patient location during evaluation: PACU Anesthesia Type: General Level of consciousness: awake Pain management: pain level controlled Vital Signs Assessment: post-procedure vital signs reviewed and stable Respiratory status: spontaneous breathing, nonlabored ventilation and respiratory function stable Cardiovascular status: blood pressure returned to baseline and stable Postop Assessment: no apparent nausea or vomiting Anesthetic complications: no   No notable events documented.  Last Vitals:  Vitals:   07/22/24 1123 07/22/24 1130  BP:  105/61  Pulse: 76 72  Resp: 16 14  Temp:    SpO2: 98% 97%    Last Pain:  Vitals:   07/22/24 1130  TempSrc:   PainSc: 2                  Delon Aisha Arch

## 2024-07-22 NOTE — Anesthesia Procedure Notes (Signed)
 Procedure Name: Intubation Date/Time: 07/22/2024 9:38 AM  Performed by: Lenard Lacks, CRNAPre-anesthesia Checklist: Patient identified, Emergency Drugs available, Suction available and Patient being monitored Patient Re-evaluated:Patient Re-evaluated prior to induction Oxygen Delivery Method: Circle system utilized Preoxygenation: Pre-oxygenation with 100% oxygen Induction Type: IV induction Ventilation: Mask ventilation without difficulty Laryngoscope Size: Miller and 2 Grade View: Grade I Tube type: Oral Tube size: 7.0 mm Number of attempts: 1 Airway Equipment and Method: Stylet Placement Confirmation: ETT inserted through vocal cords under direct vision, positive ETCO2 and breath sounds checked- equal and bilateral Tube secured with: Tape Dental Injury: Teeth and Oropharynx as per pre-operative assessment

## 2024-07-23 ENCOUNTER — Encounter (HOSPITAL_BASED_OUTPATIENT_CLINIC_OR_DEPARTMENT_OTHER): Payer: Self-pay | Admitting: Otolaryngology

## 2024-07-25 ENCOUNTER — Encounter (INDEPENDENT_AMBULATORY_CARE_PROVIDER_SITE_OTHER): Payer: Self-pay | Admitting: Otolaryngology

## 2024-07-25 ENCOUNTER — Ambulatory Visit (INDEPENDENT_AMBULATORY_CARE_PROVIDER_SITE_OTHER): Admitting: Otolaryngology

## 2024-07-25 VITALS — BP 111/66 | HR 88 | Ht 61.0 in | Wt 142.0 lb

## 2024-07-25 DIAGNOSIS — J310 Chronic rhinitis: Secondary | ICD-10-CM

## 2024-07-25 NOTE — Progress Notes (Signed)
 Doyle splints removed. Septum and turbinates are healing well.   Both Foss debrided.  Nasal saline irrigation.  Recheck in 3 weeks.

## 2024-07-31 ENCOUNTER — Ambulatory Visit (INDEPENDENT_AMBULATORY_CARE_PROVIDER_SITE_OTHER)

## 2024-07-31 DIAGNOSIS — J309 Allergic rhinitis, unspecified: Secondary | ICD-10-CM

## 2024-08-02 ENCOUNTER — Ambulatory Visit (INDEPENDENT_AMBULATORY_CARE_PROVIDER_SITE_OTHER)

## 2024-08-02 DIAGNOSIS — J309 Allergic rhinitis, unspecified: Secondary | ICD-10-CM | POA: Diagnosis not present

## 2024-08-06 ENCOUNTER — Other Ambulatory Visit: Payer: Self-pay | Admitting: "Endocrinology

## 2024-08-06 DIAGNOSIS — E890 Postprocedural hypothyroidism: Secondary | ICD-10-CM

## 2024-08-06 NOTE — Telephone Encounter (Signed)
 Pt has an appt 12/2 do you want labs done? If so please place the lab order for pt

## 2024-08-07 ENCOUNTER — Ambulatory Visit (INDEPENDENT_AMBULATORY_CARE_PROVIDER_SITE_OTHER)

## 2024-08-07 DIAGNOSIS — J309 Allergic rhinitis, unspecified: Secondary | ICD-10-CM

## 2024-08-07 DIAGNOSIS — G43719 Chronic migraine without aura, intractable, without status migrainosus: Secondary | ICD-10-CM | POA: Diagnosis not present

## 2024-08-09 ENCOUNTER — Ambulatory Visit (INDEPENDENT_AMBULATORY_CARE_PROVIDER_SITE_OTHER): Payer: Self-pay

## 2024-08-09 DIAGNOSIS — J309 Allergic rhinitis, unspecified: Secondary | ICD-10-CM

## 2024-08-14 ENCOUNTER — Ambulatory Visit (INDEPENDENT_AMBULATORY_CARE_PROVIDER_SITE_OTHER)

## 2024-08-14 ENCOUNTER — Telehealth: Payer: Self-pay

## 2024-08-14 DIAGNOSIS — J309 Allergic rhinitis, unspecified: Secondary | ICD-10-CM | POA: Diagnosis not present

## 2024-08-14 DIAGNOSIS — E890 Postprocedural hypothyroidism: Secondary | ICD-10-CM | POA: Diagnosis not present

## 2024-08-14 NOTE — Telephone Encounter (Signed)
 We received notification today that patient cancelled her [facility] appointment with Dr. Kalpana Bair on 08/21/2024 via MyChart with the following comment:  Patient comments: I thought they moved this appt to Dec 10th  I looked in patient's chart, and she has a physical scheduled with Dr. Abbey on 08/28/2024.  Patient's last physical was on 08/22/2023 with Dr. Camellia Her.  Please let us  know if Dr. Abbey would like to have patient's TOC and physical as one appointment on 08/28/2024 at 9am.  If not, we will reschedule patient's [facility] appointment and then schedule a separate appointment for patient's physical.

## 2024-08-14 NOTE — Telephone Encounter (Signed)
 Noted

## 2024-08-14 NOTE — Telephone Encounter (Signed)
 I spoke with Doyce Croak, CMA, who spoke with Dr. Luke Shade and explained this situation.  Sarah Moran states Dr. Shade agreed to see patient on 08/28/2024 for her TOC and physical in the same visit.  I left voicemail for patient relaying the information regarding the situation and let her know that Dr. Shade has agreed to have her TOC and physical in the same visit.  I asked her to please call us  if she has any questions.

## 2024-08-15 LAB — TSH: TSH: 0.431 u[IU]/mL — ABNORMAL LOW (ref 0.450–4.500)

## 2024-08-15 LAB — T4, FREE: Free T4: 1.45 ng/dL (ref 0.82–1.77)

## 2024-08-20 ENCOUNTER — Ambulatory Visit: Payer: BC Managed Care – PPO | Admitting: "Endocrinology

## 2024-08-20 ENCOUNTER — Encounter: Payer: Self-pay | Admitting: "Endocrinology

## 2024-08-20 VITALS — BP 92/68 | HR 76 | Ht 61.0 in | Wt 151.0 lb

## 2024-08-20 DIAGNOSIS — E890 Postprocedural hypothyroidism: Secondary | ICD-10-CM

## 2024-08-20 DIAGNOSIS — Z85850 Personal history of malignant neoplasm of thyroid: Secondary | ICD-10-CM | POA: Diagnosis not present

## 2024-08-20 MED ORDER — SYNTHROID 88 MCG PO TABS: 88.0000 ug | ORAL_TABLET | Freq: Every day | ORAL | 1 refills | Status: AC

## 2024-08-20 NOTE — Progress Notes (Signed)
 08/20/2024, 1:25 PM   Endocrinology follow-up note  Subjective:    Patient ID: Sarah Moran, female    DOB: Jun 03, 1981, PCP Pcp, No   Past Medical History:  Diagnosis Date   Allergy    Anal fissure    Anxiety    no meds   Chronic pelvic pain in female    Family history of breast cancer    My Risk neg 5/18   Family history of cervical cancer    Family history of skin cancer    Family history of throat cancer    Frequent headaches    Gallstone    Gallstones    Genetic testing of female 01/2017   My Risk/BRCA neg   GERD (gastroesophageal reflux disease)    well controlled. Takes gingerroot when symptoms   H. pylori infection 06/19/2018   Hypothyroidism    s/p thyroidectomy - levothyroxine  75 mcg daily   IBS (irritable bowel syndrome)    constipation - takes miralax daily   Increased risk of breast cancer 01/2017   IBIS=18.8%/riskscore=21.9%   Liver cyst 04/2018   Migraine    takes excedrin. She has tried imitrex  in the past    Personal history of radiation therapy    Seizure (HCC) 1997    x 1-age 43-after surgery for scoliosis   Thyroid  cancer (HCC) 2007   s/p thyroidectomy   Past Surgical History:  Procedure Laterality Date   ANAL RECTAL MANOMETRY N/A 10/12/2018   Procedure: ANO RECTAL MANOMETRY;  Surgeon: Shila Gustav GAILS, MD;  Location: [facility];  Service: Endoscopy;  Laterality: N/A;   ANTERIOR AND POSTERIOR REPAIR WITH SACROSPINOUS FIXATION N/A 08/02/2018   Procedure: ANTERIOR AND POSTERIOR REPAIR;  Surgeon: Victor Claudell SAUNDERS, MD;  Location: [facility];  Service: Gynecology;  Laterality: N/A;   BACK SURGERY  1997   scoliosis   CHOLECYSTECTOMY N/A 09/21/2018   Procedure: LAPAROSCOPIC CHOLECYSTECTOMY;  Surgeon: Marolyn Nest, MD;  Location: [facility];  Service: General;  Laterality: N/A;   COLONOSCOPY     ENDOSCOPIC CONCHA BULLOSA RESECTION Right 07/22/2024   Procedure: EXCISION, CONCHA BULLOSA,  ENDOSCOPIC;  Surgeon: Karis Clunes, MD;  Location: [facility];  Service: ENT;  Laterality: Right;  right concha bullosa resection   ESOPHAGOGASTRODUODENOSCOPY (EGD) WITH PROPOFOL  N/A 06/12/2018   Procedure: ESOPHAGOGASTRODUODENOSCOPY (EGD) WITH PROPOFOL ;  Surgeon: Unk Corinn Skiff, MD;  Location: [facility];  Service: Gastroenterology;  Laterality: N/A;   HERNIA REPAIR  1990   bilateral inguinal   NASAL SEPTOPLASTY W/ TURBINOPLASTY Bilateral 07/22/2024   Procedure: SEPTOPLASTY, NOSE, WITH NASAL TURBINATE REDUCTION;  Surgeon: Karis Clunes, MD;  Location: [facility];  Service: ENT;  Laterality: Bilateral;  Septoplasty, bilateral inferior turbinate reduction   PUBOVAGINAL SLING N/A 08/02/2018   Procedure: PUBO-VAGINAL SLING- TVT;  Surgeon: Victor Claudell SAUNDERS, MD;  Location: [facility];  Service: Gynecology;  Laterality: N/A;   THYROIDECTOMY  2007   thyroid  cancer   TOTAL ABDOMINAL HYSTERECTOMY  2008   dymenorrhea   Social History   Socioeconomic History   Marital status: Married    Spouse name: Ludie   Number of children: 2   Years of education: 12   Highest education level: Not on file  Occupational History   Occupation: Stay at home mom  Tobacco Use   Smoking status: Never   Smokeless tobacco: Never  Vaping Use   Vaping status: Never Used  Substance and Sexual Activity   Alcohol use: Not Currently   Drug use: No   Sexual activity: Yes    Birth control/protection: Surgical    Comment: Hysterectomy  Other Topics Concern   Not on file  Social History Narrative   Shaleta grew up in [place] . She currently lives in [place] with her husband, Ludie, and their 2 sons ([place]  and [place] ). 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 Courtnie belongs to the [employer] faith. She enjoys outdoor activities on her spare time.   Social Drivers of Corporate Investment Banker Strain: Not on file  Food  Insecurity: Not on file  Transportation Needs: Not on file  Physical Activity: Not on file  Stress: Not on file  Social Connections: Not on file   Family History  Problem Relation Age of Onset   Hyperlipidemia Father    Hypertension Father    Clotting disorder Sister    Hepatitis B Brother    Breast cancer Paternal Grandmother 54       with mets   Cervical cancer Mother        dx. in her early 73s   Breast cancer Paternal Aunt 50   Throat cancer Paternal Aunt 66   Breast cancer Paternal Aunt 46   Breast cancer Paternal Aunt        29   Skin cancer Paternal Aunt    Cancer Cousin        unknown type dx. in her late 20s/early 30s   Skin cancer Cousin        13 cancerous moles removed in her 49s   Outpatient Encounter Medications as of 08/20/2024  Medication Sig   AJOVY 225 MG/1.5ML SOAJ    baclofen (LIORESAL) 10 MG tablet Take 10 mg by mouth 2 (two) times daily as needed.   Barberry-Oreg Grape-Goldenseal (BERBERINE COMPLEX PO) Take by mouth daily.   cetirizine (ZYRTEC ALLERGY) 10 MG tablet Take 10 mg by mouth.   Cholecalciferol (VITAMIN D3) 50 MCG (2000 UT) capsule Take 2,000 Units by mouth daily.   Cod Liver Oil CAPS Take 1 capsule by mouth at bedtime.    EPINEPHrine  (EPIPEN  2-PAK) 0.3 mg/0.3 mL IJ SOAJ injection Inject 0.3 mg into the muscle as needed for anaphylaxis.   Fexofenadine HCl (ALLEGRA ALLERGY PO) Take 1 tablet by mouth daily.   fluticasone  (FLONASE ) 50 MCG/ACT nasal spray PLACE 2 SPRAYS INTO BOTH NOSTRILS 2 (TWO) TIMES DAILY   levocetirizine (XYZAL ) 5 MG tablet TAKE 1 TABLET BY MOUTH EVERY DAY IN THE EVENING   pantoprazole  (PROTONIX ) 40 MG tablet Take 1 tablet (40 mg total) by mouth daily.   polyethylene glycol (MIRALAX / GLYCOLAX) 17 g packet Take 17 g by mouth daily.   SYNTHROID  88 MCG tablet Take 1 tablet (88 mcg total) by mouth daily before breakfast.   [DISCONTINUED] SYNTHROID  88 MCG tablet TAKE 1 TABLET BY MOUTH ONCE DAILY BEFORE BREAKFAST   No  facility-administered encounter medications on file as of 08/20/2024.   ALLERGIES: Allergies  Allergen Reactions   Darvon [Propoxyphene] Other (See Comments)    Hallucinations    Onion Other (See Comments)    Migraines   Percocet [Oxycodone-Acetaminophen ] Hives   Vicodin [Hydrocodone-Acetaminophen ] Hives    VACCINATION  STATUS: Immunization History  Administered Date(s) Administered   DTaP 10/03/2011   Hepb-cpg 01/26/2022, 03/07/2022   Influenza, Seasonal, Injecte, Preservative Fre 08/22/2023   Influenza,inj,Quad PF,6+ Mos 08/15/2018, 06/19/2019, 07/01/2020, 07/27/2021, 05/11/2022   Influenza-Unspecified 06/19/2019   PFIZER(Purple Top)SARS-COV-2 Vaccination 12/23/2019, 01/15/2020   Tdap 04/01/2016    HPI Kaytlynne D Dorce is 43 y.o. female who presents today with a medical history as above. she is being seen in follow-up after she was seen in consultation for history of thyroid  cancer requested by Pcp, No.   She was diagnosed with thyroid  cancer in 2007, was operated on by Dr. Herminio.  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  She received 99.7 mCi of I-131 on November 11, 2005 at [facility]. I do not see a whole-body scan in her records.    Her most recent 3 measurements of unstimulated thyroglobulin levels are  undetectable. -Her recent  thyroid /neck  ultrasound is negative for any significant findings. She has been given various dose of Synthroid  over the years.  She is currently on Synthroid  88 mcg p.o. daily before breakfast.  She has no new complaints today.   She denies family history of thyroid  malignancy.  She reports that she does not tolerate generic levothyroxine .  She denies dysphagia, shortness of breath, nor voice change. Does not palpitations, tremors, nor heat intolerance.  Review of Systems  Constitutional: + Presents with 9 pounds of weight gain  since last visit,  no fatigue, no subjective hyperthermia, no subjective hypothermia Eyes: no blurry vision, no xerophthalmia ENT: no sore throat, no nodules palpated in throat, no dysphagia/odynophagia, no hoarseness   Objective:       08/20/2024    1:07 PM 07/25/2024   12:54 PM 07/22/2024   11:53 AM  Vitals with BMI  Height 5' 1 5' 1   Weight 151 lbs 142 lbs   BMI 28.55 26.84   Systolic 92 111 127  Diastolic 68 66 70  Pulse 76 88 68    BP 92/68   Pulse 76   Ht 5' 1 (1.549 m)   Wt 151 lb (68.5 kg)   BMI 28.53 kg/m   Wt Readings from Last 3 Encounters:  08/20/24 151 lb (68.5 kg)  07/25/24 142 lb (64.4 kg)  07/22/24 147 lb 4.3 oz (66.8 kg)    Physical Exam  Constitutional:  Body mass index is 28.53 kg/m.,  not in acute distress, normal state of mind Eyes: PERRLA, EOMI, no exophthalmos ENT: moist mucous membranes,+ old thyroidectomy scar in anterior lower neck.  no gross cervical lymphadenopathy   CMP ( most recent) CMP     Component Value Date/Time   NA 137 08/22/2023 0846   NA 142 12/23/2021 0000   NA 140 12/06/2012 1056   K 3.7 08/22/2023 0846   K 3.9 12/06/2012 1056   CL 100 08/22/2023 0846   CL 106 12/06/2012 1056   CO2 31 08/22/2023 0846   CO2 29 12/06/2012 1056   GLUCOSE 83 08/22/2023 0846   GLUCOSE 90 12/06/2012 1056   BUN 7 08/22/2023 0846   BUN 7 12/23/2021 0000   BUN 8 12/06/2012 1056   CREATININE 0.69 08/22/2023 0846   CREATININE 0.79 12/06/2012 1056   CALCIUM  8.9 08/22/2023 0846   CALCIUM  8.7 12/06/2012 1056   PROT 7.3 08/22/2023 0846   PROT 7.2 12/06/2012 1056   ALBUMIN 4.3 08/22/2023 0846   ALBUMIN 3.5  12/06/2012 1056   AST 14 08/22/2023 0846   AST 18 12/06/2012 1056   ALT 9 08/22/2023 0846   ALT 17 12/06/2012 1056   ALKPHOS 46 08/22/2023 0846   ALKPHOS 56 12/06/2012 1056   BILITOT 0.5 08/22/2023 0846   BILITOT 0.2 12/06/2012 1056   GFRNONAA >60 07/24/2020 1118   GFRNONAA >60 12/06/2012 1056   GFRAA >60 05/19/2020 0640   GFRAA  >60 12/06/2012 1056     Diabetic Labs (most recent): Lab Results  Component Value Date   HGBA1C 5.7 08/22/2023   HGBA1C 5.2 02/17/2023   HGBA1C 5.7 08/18/2022     Lipid Panel ( most recent) Lipid Panel     Component Value Date/Time   CHOL 241 (H) 08/22/2023 0846   TRIG 178.0 (H) 08/22/2023 0846   HDL 44.30 08/22/2023 0846   CHOLHDL 5 08/22/2023 0846   VLDL 35.6 08/22/2023 0846   LDLCALC 161 (H) 08/22/2023 0846      Lab Results  Component Value Date   TSH 0.431 (L) 08/14/2024   TSH 1.210 06/07/2024   TSH 0.754 02/27/2024   TSH 0.847 08/07/2023   TSH 0.489 01/23/2023   TSH 0.287 (L) 11/11/2022   TSH 2.450 08/01/2022   TSH 1.58 05/11/2022   TSH 0.25 (L) 03/07/2022   TSH 0.03 Repeated and verified X2. (L) 01/24/2022   FREET4 1.45 08/14/2024   FREET4 1.65 06/07/2024   FREET4 1.62 02/27/2024   FREET4 1.42 08/07/2023   FREET4 1.68 01/23/2023   FREET4 1.57 11/11/2022   FREET4 1.40 08/01/2022   FREET4 1.08 03/25/2021   FREET4 1.53 07/31/2020   FREET4 1.47 (H) 07/24/2020        Thyroid /neck ultrasound on July 28, 2022 FINDINGS: Isthmus: Surgically absent. There is no residual nodular soft tissue within the isthmic resection bed.   Right lobe: Surgically absent. There is no residual nodular soft tissue within the right lobectomy resection bed.   Left lobe: Surgically absent. There is no residual nodular soft tissue within the left lobectomy resection bed.    Assessment & Plan:   1. Postsurgical hypothyroidism 2. History of thyroid  cancer   - I have reviewed her new and available thyroid  records and clinically evaluated the patient. - Based on these reviews, she has history of papillary thyroid  cancer status postsurgery and radioactive iodine thyroid  remnant ablation in 2007.  This completes her initial intensive treatment for differentiated thyroid  cancer.   Her recent surveillance thyroid /neck ultrasound was without any evidence of tumor recurrence or  relapse.  Her 3 recent measurements of unstimulated thyroglobulin are undetectable. -She is considered excellent responder with low risk of thyroid  cancer recurrence.  Her next labs will include thyroglobulin and thyroglobulin antibodies.  Regarding her postsurgical hypothyroidism, her previsit labs are such that she would benefit from her current dose of Synthroid .  She is currently on Synthroid  88 mcg p.o. daily before breakfast breakfast.  She is tolerating and responding to this medication.     - We discussed about the correct intake of her thyroid  hormone, on empty stomach at fasting, with water, separated by at least 30 minutes from breakfast and other medications,  and separated by more than 4 hours from calcium , iron, multivitamins, acid reflux medications (PPIs). -Patient is made aware of the fact that thyroid  hormone replacement is needed for life, dose to be adjusted by periodic monitoring of thyroid  function tests.   Regarding her weight concern: I discussed and recommended whole food plant-based diet with her.   - she  is advised to maintain close follow up with Pcp, No for primary care needs.   I spent  20  minutes in the care of the patient today including review of labs from Thyroid  Function, CMP, and other relevant labs ; imaging/biopsy records (current and previous including abstractions from other facilities); face-to-face time discussing  her lab results and symptoms, medications doses, her options of short and long term treatment based on the latest standards of care / guidelines;   and documenting the encounter.  Oriya D Koury  participated in the discussions, expressed understanding, and voiced agreement with the above plans.  All questions were answered to her satisfaction. she is encouraged to contact clinic should she have any questions or concerns prior to her return visit.   Follow up plan: Return in about 6 months (around 02/18/2025) for F/U with Pre-visit  Labs.   Ranny Earl, MD [facility] [address] [place], [place] [address] Phone: [phone]  Fax: [phone]    08/20/2024, 1:25 PM  This note was partially dictated with voice recognition software. Similar sounding words can be transcribed inadequately or may not  be corrected upon review.

## 2024-08-21 ENCOUNTER — Encounter (INDEPENDENT_AMBULATORY_CARE_PROVIDER_SITE_OTHER): Admitting: Otolaryngology

## 2024-08-21 ENCOUNTER — Encounter

## 2024-08-22 ENCOUNTER — Ambulatory Visit (INDEPENDENT_AMBULATORY_CARE_PROVIDER_SITE_OTHER): Admitting: Otolaryngology

## 2024-08-22 ENCOUNTER — Encounter (INDEPENDENT_AMBULATORY_CARE_PROVIDER_SITE_OTHER): Payer: Self-pay | Admitting: Otolaryngology

## 2024-08-22 VITALS — BP 113/68 | HR 74 | Ht 61.0 in | Wt 145.0 lb

## 2024-08-22 DIAGNOSIS — J342 Deviated nasal septum: Secondary | ICD-10-CM

## 2024-08-22 DIAGNOSIS — J310 Chronic rhinitis: Secondary | ICD-10-CM

## 2024-08-22 DIAGNOSIS — Z09 Encounter for follow-up examination after completed treatment for conditions other than malignant neoplasm: Secondary | ICD-10-CM

## 2024-08-22 NOTE — Progress Notes (Signed)
 Septum and turbinates are healing well.   Both [place] debrided.   Nasal saline irrigation as needed.   Recheck in 6 months.

## 2024-08-28 ENCOUNTER — Ambulatory Visit

## 2024-08-30 ENCOUNTER — Ambulatory Visit

## 2024-08-30 DIAGNOSIS — J309 Allergic rhinitis, unspecified: Secondary | ICD-10-CM

## 2024-09-04 ENCOUNTER — Ambulatory Visit (INDEPENDENT_AMBULATORY_CARE_PROVIDER_SITE_OTHER)

## 2024-09-04 DIAGNOSIS — J309 Allergic rhinitis, unspecified: Secondary | ICD-10-CM

## 2024-09-04 DIAGNOSIS — M542 Cervicalgia: Secondary | ICD-10-CM | POA: Diagnosis not present

## 2024-09-04 DIAGNOSIS — G43719 Chronic migraine without aura, intractable, without status migrainosus: Secondary | ICD-10-CM | POA: Diagnosis not present

## 2024-09-06 ENCOUNTER — Ambulatory Visit (INDEPENDENT_AMBULATORY_CARE_PROVIDER_SITE_OTHER)

## 2024-09-06 DIAGNOSIS — J309 Allergic rhinitis, unspecified: Secondary | ICD-10-CM

## 2024-09-06 NOTE — Telephone Encounter (Signed)
 Patient called at 10 am this morning and left a voice mail, stating that she is having  thick,green nasal drainage/ congestion. Per Dr. Anice ( Dr.Teoh is out of the office ) I called in Augmentin  875 mg BID for 7 days. [facility] [place].

## 2024-09-10 ENCOUNTER — Ambulatory Visit: Payer: Self-pay | Admitting: Physician Assistant

## 2024-09-17 ENCOUNTER — Ambulatory Visit: Admitting: Physician Assistant

## 2024-09-17 VITALS — BP 115/79 | HR 95 | Ht 61.0 in | Wt 148.0 lb

## 2024-09-17 DIAGNOSIS — N3946 Mixed incontinence: Secondary | ICD-10-CM

## 2024-09-17 DIAGNOSIS — R3129 Other microscopic hematuria: Secondary | ICD-10-CM

## 2024-09-17 LAB — BLADDER SCAN AMB NON-IMAGING

## 2024-09-17 NOTE — Progress Notes (Signed)
 "  09/17/2024 9:11 AM   Sarah Moran 05/16/1981 [id number]  CC: Chief Complaint  Patient presents with   Hematuria   Follow-up   HPI: Sarah Moran is a 43 y.o. female with PMH hematuria with benign cystoscopy with Dr. Penne in 2023 and benign CTU in 2024 and OAB wet with mixed urge and stress incontinence not on pharmacotherapy who presents today for annual follow-up.   Today she reports no significant urologic changes in the past year.  She continues to have urinary frequency, though this is minimally bothersome.  She denies dysuria, flank pain, or gross hematuria.  In-office UA today positive for 2+ blood; urine microscopy with 3-10 RBCs/HPF. PVR 22mL.  PMH: Past Medical History:  Diagnosis Date   Allergy    Anal fissure    Anxiety    no meds   Chronic pelvic pain in female    Family history of breast cancer    My Risk neg 5/18   Family history of cervical cancer    Family history of skin cancer    Family history of throat cancer    Frequent headaches    Gallstone    Gallstones    Genetic testing of female 01/2017   My Risk/BRCA neg   GERD (gastroesophageal reflux disease)    well controlled. Takes gingerroot when symptoms   H. pylori infection 06/19/2018   Hypothyroidism    s/p thyroidectomy - levothyroxine  75 mcg daily   IBS (irritable bowel syndrome)    constipation - takes miralax daily   Increased risk of breast cancer 01/2017   IBIS=18.8%/riskscore=21.9%   Liver cyst 04/2018   Migraine    takes excedrin. She has tried imitrex  in the past    Personal history of radiation therapy    Seizure (HCC) 1997    x 1-age 87-after surgery for scoliosis   Thyroid  cancer (HCC) 2007   s/p thyroidectomy    Surgical History: Past Surgical History:  Procedure Laterality Date   ANAL RECTAL MANOMETRY N/A 10/12/2018   Procedure: ANO RECTAL MANOMETRY;  Surgeon: Shila Gustav GAILS, MD;  Location: [facility];  Service: Endoscopy;  Laterality: N/A;   ANTERIOR  AND POSTERIOR REPAIR WITH SACROSPINOUS FIXATION N/A 08/02/2018   Procedure: ANTERIOR AND POSTERIOR REPAIR;  Surgeon: Victor Claudell SAUNDERS, MD;  Location: [facility];  Service: Gynecology;  Laterality: N/A;   BACK SURGERY  1997   scoliosis   CHOLECYSTECTOMY N/A 09/21/2018   Procedure: LAPAROSCOPIC CHOLECYSTECTOMY;  Surgeon: Marolyn Nest, MD;  Location: [facility];  Service: General;  Laterality: N/A;   COLONOSCOPY     ENDOSCOPIC CONCHA BULLOSA RESECTION Right 07/22/2024   Procedure: EXCISION, CONCHA BULLOSA, ENDOSCOPIC;  Surgeon: Karis Clunes, MD;  Location: [facility];  Service: ENT;  Laterality: Right;  right concha bullosa resection   ESOPHAGOGASTRODUODENOSCOPY (EGD) WITH PROPOFOL  N/A 06/12/2018   Procedure: ESOPHAGOGASTRODUODENOSCOPY (EGD) WITH PROPOFOL ;  Surgeon: Unk Corinn Skiff, MD;  Location: [facility];  Service: Gastroenterology;  Laterality: N/A;   HERNIA REPAIR  1990   bilateral inguinal   NASAL SEPTOPLASTY W/ TURBINOPLASTY Bilateral 07/22/2024   Procedure: SEPTOPLASTY, NOSE, WITH NASAL TURBINATE REDUCTION;  Surgeon: Karis Clunes, MD;  Location: [facility];  Service: ENT;  Laterality: Bilateral;  Septoplasty, bilateral inferior turbinate reduction   PUBOVAGINAL SLING N/A 08/02/2018   Procedure: PUBO-VAGINAL SLING- TVT;  Surgeon: Victor Claudell SAUNDERS, MD;  Location: [facility];  Service: Gynecology;  Laterality: N/A;   THYROIDECTOMY  2007   thyroid  cancer  TOTAL ABDOMINAL HYSTERECTOMY  2008   dymenorrhea    Home Medications:  Allergies as of 09/17/2024       Reactions   Darvon [propoxyphene] Other (See Comments)   Hallucinations   Onion Other (See Comments)   Migraines   Percocet [oxycodone-acetaminophen ] Hives   Vicodin [hydrocodone-acetaminophen ] Hives        Medication List        Accurate as of September 17, 2024  9:11 AM. If you have any questions, ask your nurse or doctor.          Ajovy 225 MG/1.5ML Soaj Generic drug:  Fremanezumab-vfrm   ALLEGRA ALLERGY PO Take 1 tablet by mouth daily.   baclofen 10 MG tablet Commonly known as: LIORESAL Take 10 mg by mouth 2 (two) times daily as needed.   BERBERINE COMPLEX PO Take by mouth daily.   Cod Liver Oil Caps Take 1 capsule by mouth at bedtime.   EPINEPHrine  0.3 mg/0.3 mL Soaj injection Commonly known as: EpiPen  2-Pak Inject 0.3 mg into the muscle as needed for anaphylaxis.   fluticasone  50 MCG/ACT nasal spray Commonly known as: FLONASE  PLACE 2 SPRAYS INTO BOTH NOSTRILS 2 (TWO) TIMES DAILY   levocetirizine 5 MG tablet Commonly known as: XYZAL  TAKE 1 TABLET BY MOUTH EVERY DAY IN THE EVENING   polyethylene glycol 17 g packet Commonly known as: MIRALAX / GLYCOLAX Take 17 g by mouth daily.   Synthroid  88 MCG tablet Generic drug: levothyroxine  Take 1 tablet (88 mcg total) by mouth daily before breakfast.   Vitamin D3 50 MCG (2000 UT) capsule Take 2,000 Units by mouth daily.   ZyrTEC Allergy 10 MG tablet Generic drug: cetirizine Take 10 mg by mouth.        Allergies:  Allergies[1]  Family History: Family History  Problem Relation Age of Onset   Hyperlipidemia Father    Hypertension Father    Clotting disorder Sister    Hepatitis B Brother    Breast cancer Paternal Grandmother 4       with mets   Cervical cancer Mother        dx. in her early 83s   Breast cancer Paternal Aunt 50   Throat cancer Paternal Aunt 70   Breast cancer Paternal Aunt 81   Breast cancer Paternal Aunt        56   Skin cancer Paternal Aunt    Cancer Cousin        unknown type dx. in her late 20s/early 30s   Skin cancer Cousin        13 cancerous moles removed in her 11s    Social History:   reports that she has never smoked. She has never used smokeless tobacco. She reports that she does not currently use alcohol. She reports that she does not use drugs.  Physical Exam: BP 115/79 (BP Location: Left Arm, Patient Position: Sitting, Cuff Size: Normal)    Pulse 95   Ht 5' 1 (1.549 m)   Wt 148 lb (67.1 kg)   SpO2 99%   BMI 27.96 kg/m   Constitutional:  Alert and oriented, no acute distress, nontoxic appearing HEENT: [place], AT Cardiovascular: No clubbing, cyanosis, or edema Respiratory: Normal respiratory effort, no increased work of breathing Skin: No rashes, bruises or suspicious lesions Neurologic: Grossly intact, no focal deficits, moving all 4 extremities Psychiatric: Normal mood and affect  Laboratory Data: Results for orders placed or performed in visit on 09/17/24  Bladder Scan (Post Void Residual) in office  Collection Time: 09/17/24  9:07 AM  Result Value Ref Range   Scan Result 22ml    Assessment & Plan:   1. Microscopic hematuria (Primary) Stable, benign workup recently.  Will continue to monitor.  We discussed warning signs including gross hematuria, flank pain, and dysuria. - Urinalysis, Complete  2. Mixed urge and stress incontinence Stable, minimally bothersome.  Will continue to defer pharmacotherapy.  Will continue to monitor. - Bladder Scan (Post Void Residual) in office   Return in about 1 year (around 09/17/2025) for Annual UA.  Lucie Hones, PA-C  [facility] [place] [address], [address] [place], [place] [address] [phone]     [1]  Allergies Allergen Reactions   Darvon [Propoxyphene] Other (See Comments)    Hallucinations    Onion Other (See Comments)    Migraines   Percocet [Oxycodone-Acetaminophen ] Hives   Vicodin [Hydrocodone-Acetaminophen ] Hives   "

## 2024-09-18 LAB — URINALYSIS, COMPLETE
Bilirubin, UA: NEGATIVE
Glucose, UA: NEGATIVE
Ketones, UA: NEGATIVE
Leukocytes,UA: NEGATIVE
Nitrite, UA: NEGATIVE
Protein,UA: NEGATIVE
Specific Gravity, UA: 1.01 (ref 1.005–1.030)
Urobilinogen, Ur: 0.2 mg/dL (ref 0.2–1.0)
pH, UA: 6 (ref 5.0–7.5)

## 2024-09-18 LAB — MICROSCOPIC EXAMINATION: Bacteria, UA: NONE SEEN

## 2024-09-20 ENCOUNTER — Ambulatory Visit

## 2024-09-20 DIAGNOSIS — J309 Allergic rhinitis, unspecified: Secondary | ICD-10-CM

## 2024-09-25 ENCOUNTER — Ambulatory Visit

## 2024-09-25 DIAGNOSIS — J302 Other seasonal allergic rhinitis: Secondary | ICD-10-CM

## 2024-09-27 ENCOUNTER — Ambulatory Visit

## 2024-09-27 DIAGNOSIS — J302 Other seasonal allergic rhinitis: Secondary | ICD-10-CM

## 2024-10-02 ENCOUNTER — Ambulatory Visit (INDEPENDENT_AMBULATORY_CARE_PROVIDER_SITE_OTHER)

## 2024-10-02 DIAGNOSIS — J302 Other seasonal allergic rhinitis: Secondary | ICD-10-CM

## 2024-10-03 ENCOUNTER — Other Ambulatory Visit: Payer: Self-pay | Admitting: Allergy & Immunology

## 2024-10-04 ENCOUNTER — Ambulatory Visit (INDEPENDENT_AMBULATORY_CARE_PROVIDER_SITE_OTHER)

## 2024-10-04 DIAGNOSIS — J302 Other seasonal allergic rhinitis: Secondary | ICD-10-CM | POA: Diagnosis not present

## 2024-10-09 ENCOUNTER — Ambulatory Visit

## 2024-10-09 DIAGNOSIS — J302 Other seasonal allergic rhinitis: Secondary | ICD-10-CM

## 2024-10-17 ENCOUNTER — Other Ambulatory Visit: Payer: Self-pay | Admitting: Allergy & Immunology

## 2024-10-18 ENCOUNTER — Ambulatory Visit

## 2024-10-18 DIAGNOSIS — J302 Other seasonal allergic rhinitis: Secondary | ICD-10-CM | POA: Diagnosis not present

## 2024-12-11 ENCOUNTER — Ambulatory Visit: Admitting: Allergy & Immunology

## 2025-02-18 ENCOUNTER — Ambulatory Visit: Admitting: "Endocrinology

## 2025-02-20 ENCOUNTER — Ambulatory Visit (INDEPENDENT_AMBULATORY_CARE_PROVIDER_SITE_OTHER): Admitting: Otolaryngology

## 2025-09-17 ENCOUNTER — Ambulatory Visit: Admitting: Physician Assistant
